# Patient Record
Sex: Male | Born: 1945
Health system: Southern US, Community
[De-identification: ages and names within clinical notes are randomized; demographics above are authoritative.]

## PROBLEM LIST (undated history)

## (undated) DIAGNOSIS — K7460 Unspecified cirrhosis of liver: Secondary | ICD-10-CM

## (undated) DIAGNOSIS — R0600 Dyspnea, unspecified: Secondary | ICD-10-CM

## (undated) DIAGNOSIS — M51379 Other intervertebral disc degeneration, lumbosacral region without mention of lumbar back pain or lower extremity pain: Secondary | ICD-10-CM

## (undated) DIAGNOSIS — B1910 Unspecified viral hepatitis B without hepatic coma: Secondary | ICD-10-CM

## (undated) DIAGNOSIS — K219 Gastro-esophageal reflux disease without esophagitis: Secondary | ICD-10-CM

## (undated) DIAGNOSIS — E785 Hyperlipidemia, unspecified: Secondary | ICD-10-CM

## (undated) DIAGNOSIS — S42023A Displaced fracture of shaft of unspecified clavicle, initial encounter for closed fracture: Secondary | ICD-10-CM

## (undated) DIAGNOSIS — F1910 Other psychoactive substance abuse, uncomplicated: Secondary | ICD-10-CM

## (undated) DIAGNOSIS — J449 Chronic obstructive pulmonary disease, unspecified: Secondary | ICD-10-CM

## (undated) DIAGNOSIS — B1920 Unspecified viral hepatitis C without hepatic coma: Secondary | ICD-10-CM

## (undated) DIAGNOSIS — R972 Elevated prostate specific antigen [PSA]: Secondary | ICD-10-CM

## (undated) DIAGNOSIS — I10 Essential (primary) hypertension: Secondary | ICD-10-CM

## (undated) DIAGNOSIS — M5137 Other intervertebral disc degeneration, lumbosacral region: Secondary | ICD-10-CM

## (undated) DIAGNOSIS — I719 Aortic aneurysm of unspecified site, without rupture: Secondary | ICD-10-CM

## (undated) DIAGNOSIS — N400 Enlarged prostate without lower urinary tract symptoms: Secondary | ICD-10-CM

## (undated) DIAGNOSIS — J439 Emphysema, unspecified: Secondary | ICD-10-CM

## (undated) DIAGNOSIS — Z9889 Other specified postprocedural states: Secondary | ICD-10-CM

## (undated) DIAGNOSIS — C61 Malignant neoplasm of prostate: Secondary | ICD-10-CM

## (undated) DIAGNOSIS — A159 Respiratory tuberculosis unspecified: Secondary | ICD-10-CM

## (undated) DIAGNOSIS — M353 Polymyalgia rheumatica: Secondary | ICD-10-CM

## (undated) DIAGNOSIS — H269 Unspecified cataract: Secondary | ICD-10-CM

## (undated) DIAGNOSIS — M1990 Unspecified osteoarthritis, unspecified site: Secondary | ICD-10-CM

## (undated) DIAGNOSIS — B009 Herpesviral infection, unspecified: Secondary | ICD-10-CM

## (undated) DIAGNOSIS — F1911 Other psychoactive substance abuse, in remission: Secondary | ICD-10-CM

## (undated) HISTORY — DX: Benign prostatic hyperplasia without lower urinary tract symptoms: N40.0

## (undated) HISTORY — DX: Malignant neoplasm of prostate: C61

## (undated) HISTORY — DX: Elevated prostate specific antigen (PSA): R97.20

## (undated) HISTORY — PX: SPINE SURGERY: SHX786

## (undated) HISTORY — DX: Chronic obstructive pulmonary disease, unspecified: J44.9

## (undated) HISTORY — DX: Other specified postprocedural states: Z98.890

## (undated) HISTORY — DX: Other intervertebral disc degeneration, lumbosacral region without mention of lumbar back pain or lower extremity pain: M51.379

## (undated) HISTORY — DX: Other intervertebral disc degeneration, lumbosacral region: M51.37

## (undated) HISTORY — PX: BACK SURGERY: SHX140

## (undated) HISTORY — DX: Hyperlipidemia, unspecified: E78.5

## (undated) HISTORY — PX: EYE SURGERY: SHX253

## (undated) HISTORY — DX: Unspecified osteoarthritis, unspecified site: M19.90

## (undated) HISTORY — PX: OTHER SURGICAL HISTORY: SHX169

## (undated) HISTORY — PX: CERVICAL SPINE SURGERY: SHX589

## (undated) HISTORY — DX: Polymyalgia rheumatica: M35.3

## (undated) HISTORY — DX: Emphysema, unspecified: J43.9

## (undated) HISTORY — DX: Respiratory tuberculosis unspecified: A15.9

## (undated) HISTORY — DX: Herpesviral infection, unspecified: B00.9

## (undated) HISTORY — DX: Unspecified viral hepatitis C without hepatic coma: B19.20

## (undated) HISTORY — PX: HERNIA REPAIR: SHX51

## (undated) HISTORY — DX: Essential (primary) hypertension: I10

## (undated) HISTORY — DX: Other psychoactive substance abuse, uncomplicated: F19.10

## (undated) HISTORY — DX: Other psychoactive substance abuse, in remission: F19.11

## (undated) HISTORY — DX: Aortic aneurysm of unspecified site, without rupture: I71.9

## (undated) HISTORY — DX: Unspecified cataract: H26.9

---

## 1990-07-21 HISTORY — PX: OTHER SURGICAL HISTORY: SHX169

## 1996-10-19 DIAGNOSIS — Z8619 Personal history of other infectious and parasitic diseases: Secondary | ICD-10-CM

## 1996-10-19 DIAGNOSIS — B1920 Unspecified viral hepatitis C without hepatic coma: Secondary | ICD-10-CM

## 1996-10-19 HISTORY — DX: Unspecified viral hepatitis C without hepatic coma: B19.20

## 1996-10-19 HISTORY — DX: Personal history of other infectious and parasitic diseases: Z86.19

## 1997-05-21 DIAGNOSIS — K7460 Unspecified cirrhosis of liver: Secondary | ICD-10-CM | POA: Insufficient documentation

## 1998-07-03 ENCOUNTER — Ambulatory Visit (HOSPITAL_COMMUNITY): Admission: RE | Admit: 1998-07-03 | Discharge: 1998-07-03 | Payer: Self-pay | Admitting: Gastroenterology

## 2000-03-09 ENCOUNTER — Ambulatory Visit (HOSPITAL_COMMUNITY): Admission: RE | Admit: 2000-03-09 | Discharge: 2000-03-09 | Payer: Self-pay | Admitting: Gastroenterology

## 2000-03-09 ENCOUNTER — Encounter (INDEPENDENT_AMBULATORY_CARE_PROVIDER_SITE_OTHER): Payer: Self-pay | Admitting: Specialist

## 2003-02-20 ENCOUNTER — Encounter: Payer: Self-pay | Admitting: *Deleted

## 2003-02-20 ENCOUNTER — Inpatient Hospital Stay (HOSPITAL_COMMUNITY): Admission: AD | Admit: 2003-02-20 | Discharge: 2003-02-24 | Payer: Self-pay | Admitting: Family Medicine

## 2003-03-10 ENCOUNTER — Encounter: Admission: RE | Admit: 2003-03-10 | Discharge: 2003-03-10 | Payer: Self-pay | Admitting: Family Medicine

## 2003-03-10 ENCOUNTER — Encounter: Payer: Self-pay | Admitting: Family Medicine

## 2004-01-17 ENCOUNTER — Encounter: Admission: RE | Admit: 2004-01-17 | Discharge: 2004-01-17 | Payer: Self-pay | Admitting: Family Medicine

## 2004-08-21 DIAGNOSIS — Z9889 Other specified postprocedural states: Secondary | ICD-10-CM

## 2004-08-21 HISTORY — DX: Other specified postprocedural states: Z98.890

## 2004-11-14 ENCOUNTER — Ambulatory Visit (HOSPITAL_COMMUNITY): Admission: RE | Admit: 2004-11-14 | Discharge: 2004-11-14 | Payer: Self-pay | Admitting: Family Medicine

## 2004-11-27 ENCOUNTER — Encounter: Admission: RE | Admit: 2004-11-27 | Discharge: 2005-02-25 | Payer: Self-pay | Admitting: Family Medicine

## 2005-08-13 ENCOUNTER — Ambulatory Visit: Payer: Self-pay

## 2006-05-15 ENCOUNTER — Inpatient Hospital Stay (HOSPITAL_COMMUNITY): Admission: RE | Admit: 2006-05-15 | Discharge: 2006-05-16 | Payer: Self-pay | Admitting: Neurosurgery

## 2009-01-12 ENCOUNTER — Encounter: Admission: RE | Admit: 2009-01-12 | Discharge: 2009-01-12 | Payer: Self-pay | Admitting: Emergency Medicine

## 2009-07-21 HISTORY — PX: OTHER SURGICAL HISTORY: SHX169

## 2009-10-31 ENCOUNTER — Encounter: Admission: RE | Admit: 2009-10-31 | Discharge: 2009-10-31 | Payer: Self-pay | Admitting: Gastroenterology

## 2009-11-21 ENCOUNTER — Ambulatory Visit (HOSPITAL_COMMUNITY): Admission: RE | Admit: 2009-11-21 | Discharge: 2009-11-21 | Payer: Self-pay | Admitting: Gastroenterology

## 2010-04-25 ENCOUNTER — Encounter: Admission: RE | Admit: 2010-04-25 | Discharge: 2010-04-25 | Payer: Self-pay | Admitting: Orthopedic Surgery

## 2010-07-04 ENCOUNTER — Encounter
Admission: RE | Admit: 2010-07-04 | Discharge: 2010-07-04 | Payer: Self-pay | Source: Home / Self Care | Attending: Emergency Medicine | Admitting: Emergency Medicine

## 2010-08-11 ENCOUNTER — Encounter: Payer: Self-pay | Admitting: Gastroenterology

## 2010-10-08 LAB — CBC
MCHC: 34.3 g/dL (ref 30.0–36.0)
MCV: 93.7 fL (ref 78.0–100.0)
Platelets: 222 10*3/uL (ref 150–400)
RBC: 5.07 MIL/uL (ref 4.22–5.81)
RDW: 13.2 % (ref 11.5–15.5)

## 2010-10-08 LAB — DIFFERENTIAL
Basophils Relative: 1 % (ref 0–1)
Eosinophils Absolute: 0.1 10*3/uL (ref 0.0–0.7)
Monocytes Relative: 12 % (ref 3–12)
Neutrophils Relative %: 51 % (ref 43–77)

## 2010-10-14 ENCOUNTER — Other Ambulatory Visit: Payer: Self-pay | Admitting: Orthopaedic Surgery

## 2010-10-14 DIAGNOSIS — M7918 Myalgia, other site: Secondary | ICD-10-CM

## 2010-10-14 DIAGNOSIS — M545 Low back pain: Secondary | ICD-10-CM

## 2010-10-15 ENCOUNTER — Other Ambulatory Visit: Payer: Self-pay | Admitting: Gastroenterology

## 2010-10-15 DIAGNOSIS — B182 Chronic viral hepatitis C: Secondary | ICD-10-CM

## 2010-10-16 ENCOUNTER — Ambulatory Visit
Admission: RE | Admit: 2010-10-16 | Discharge: 2010-10-16 | Disposition: A | Discharge status: Home / Self Care | Payer: PRIVATE HEALTH INSURANCE | Source: Ambulatory Visit | Attending: Orthopaedic Surgery | Admitting: Orthopaedic Surgery

## 2010-10-16 ENCOUNTER — Ambulatory Visit
Admission: RE | Admit: 2010-10-16 | Discharge: 2010-10-16 | Disposition: A | Discharge status: Home / Self Care | Payer: PRIVATE HEALTH INSURANCE | Source: Ambulatory Visit | Attending: Gastroenterology | Admitting: Gastroenterology

## 2010-10-16 DIAGNOSIS — M545 Low back pain: Secondary | ICD-10-CM

## 2010-10-16 DIAGNOSIS — B182 Chronic viral hepatitis C: Secondary | ICD-10-CM

## 2010-10-16 DIAGNOSIS — M7918 Myalgia, other site: Secondary | ICD-10-CM

## 2010-12-06 NOTE — Discharge Summary (Signed)
George Watson, George Watson                ACCOUNT NO.:  [id number]   MEDICAL RECORD NO.:  [id number]          PATIENT TYPE:  INP   LOCATION:  3035                         FACILITY:  [facility]   PHYSICIAN:  Payton Doughty, M.D.      DATE OF BIRTH:  Jan 15, 1946   DATE OF ADMISSION:  05/13/2006  DATE OF DISCHARGE:  05/16/2006                                 DISCHARGE SUMMARY   ADMITTING DIAGNOSIS:  Herniated disk, C6-C7.   DISCHARGE DIAGNOSIS:  Herniated disk, C6-C7.   PROCEDURE PERFORMED:  C6-C7 anterior cervical decompression and fusion.   COMPLICATIONS:  Recurrent neck hematoma, requiring second exploration.   SERVICE:  Neurosurgery.   This is a 65 year old right-handed white gentleman.  His History and  Physical is recounted in the chart.  He was admitted by Dr. Lovell Sheehan on  October 25 and underwent anterior decompression and fusion.  Postoperatively, he did well initially and then during the night had a large  clot develop in his neck.  He had to be taken back urgently to the operating  room.  He had a large hematoma.  A Jackson-Pratt drain was placed.  Through  yesterday and this morning, it drained small amounts.  The drain has been  removed.  Currently, his airway is without difficulty.  His strength is  full.  His incision is dry and well-healing.  He is being discharged with  Percocet for pain.  His followup will be in the [facility] office.           ______________________________  Payton Doughty, M.D.     MWR/MEDQ  D:  05/16/2006  T:  05/17/2006  Job:  [id number]

## 2010-12-06 NOTE — Op Note (Signed)
George Watson, George Watson                ACCOUNT NO.:  [id number]   MEDICAL RECORD NO.:  [id number]          PATIENT TYPE:  OIB   LOCATION:  3108                         FACILITY:  [facility]   PHYSICIAN:  Cristi Loron, M.D.DATE OF BIRTH:  [birth year]   DATE OF PROCEDURE:  05/13/2006  DATE OF DISCHARGE:                                 OPERATIVE REPORT   PREOPERATIVE DIAGNOSES:  C6-7 herniated nucleus pulposus, spondylosis,  stenosis, cervical radiculopathy, cervicalgia.   POSTOPERATIVE DIAGNOSES:  C6-7 herniated nucleus pulposus, spondylosis,  stenosis, cervical radiculopathy, cervicalgia.   PROCEDURES:  C6-7 anterior cervical diskectomy and interbody arthrodesis,  placement of C6-7 interbody prosthesis, C6-7 anterior cervical plating  (Codman Slimlock titanium plate and screws).   SURGEON:  Cristi Loron, M.D.   ASSISTANT:  Hewitt Shorts, M.D.   ANESTHESIA:  General endotracheal.   ESTIMATED BLOOD LOSS:  50 cc.   SPECIMENS:  None.   DRAINS:  None.   COMPLICATIONS:  None.   BRIEF HISTORY:  This patient is a 65 year old white male, who has suffered  from neck and left arm pain, consistent with a C7 radiculopathy.  He failed  medical management and was worked up with a cervical MRI, which demonstrated  a herniated disk at C6-7 with stenosis.  I discussed the various treatment  options with the patient, including surgery.  He has weighed the risks,  benefits and alternatives of surgery and decided to proceed with a C6-7  anterior cervical diskectomy and fusion.   DESCRIPTION OF PROCEDURES:  The patient was brought to the operating room by  the anesthesia team.  General endotracheal anesthesia was induced.  The  patient remained in supine position and a roll was placed under his  shoulders, placing his neck in slight extension.  His anterior cervical  region was then prepared with Betadine Scrub and Betadine Solution and  sterile drapes were applied.  I then injected  the area to be incised with  Marcaine with epinephrine solution and used a scalpel to make a transverse  incision in the patient's left anterior neck.  I used the Metzenbaum  scissors to divide the platysma muscle, and then the dissect medial to the  sternocleidomastoid, jugular vein and carotid artery.  I carefully dissected  down towards the anterior cervical spine, identifying the esophagus and  retracting it medially.  I used a Kitner and swabs and cleared soft tissue  from the anterior cervical spine, and then I inserted a bent spinal needle  through the exposed intervertebral disk space.  We obtained an  intraoperative radiograph to confirm our location.   I then used electrocautery to detach the medial border of the longus colli  muscle bilaterally to expose the C6-7 intervertebral disk space.  I then  inserted the Caspar self-retaining retractor underneath the longus colli  muscle bilaterally to provide exposure.  I incised the C6-7 intervertebral  disk with a 15-blade scalpel.  I performed a partial diskectomy using a  pituitary forceps and the Karlin curets.  I started distraction screws at C6-  7, distracted the interspace, and then used a  high-speed drill to  decorticate the C6-7 vertebral endplates.  I drilled away the remainder of  the C6-7 intervertebral disk.  I drilled away some posterior spondylosis and  thinned down the posterior longitudinal ligament.  I then incised the  ligament with an arachnoid knife and removed it with a Kerrison punch,  undercutting the vertebral endplates and decompressing the thecal sac.  I  then performed a foraminotomy about the C7 nerve roots.  Of note, there were  a soft disk herniation, as well as spondylosis on the left, and mainly  spondylosis on the right.  I decompressed the nerve roots well bilaterally.   Having completed the decompression, we now turned our attention to  arthrodesis.  I obtained a small Alphatec PEEK interbody  spacer.  I filled  it with a combination of local morcellized autograft bone.  We obtained our  decompression, as well as Alphatec putty.  We then placed the spacer into  the distracted C6-7 interspace, removed the distraction screws, and there  was a good snug fit of the prosthesis in the interspace.   We now turned attention to anterior spinal instrument.  I used a high-speed  drill to remove some ventral spondylosis from the vertebral endplate at C6-7  so that the plate would lie down flat.  We selected the appropriate length  Codman Slimlock anterior cervical plate, and we laid it along the anterior  aspect of the vertebral bodies of C6 and C7.  We drilled two 14-mm holes at  C6, two at C7, and then secured the plate to the vertebral bodies by placing  two 14-mm self-tapping screws at C6 and two at C7.  We obtained  intraoperative radiograph.  There was limited visualization because of the  patient's shoulders.  However, the instrumentation looked good in vivo.  We  therefore secured the screws to the plate by locking each cam.   We then obtained hemostasis using bipolar electrocautery.  We irrigated the  wound out with Bacitracin solution, removed the Caspar self-retaining  retractor and inspected the esophagus for any damage.  There was none  apparent.  We then reapproximated the patient's platysma muscle with  interrupted 3-0 Vicryl suture, the subcutaneous tissue with interrupted 3-0  Vicryl suture, and the skin with Steri-Strips and benzoin.  The wound was  then coated with Bacitracin ointment.  Sterile dressings were applied.  The  drapes were removed.  The patient was subsequently extubated by the  anesthesia team and transported to the postanesthesia care unit in stable  condition.  All sponge, instrument and needle counts were correct at the end  of the case.      Cristi Loron, M.D.  Electronically Signed    JDJ/MEDQ  D:  05/14/2006  T:  05/14/2006  Job:   [id number]

## 2010-12-06 NOTE — H&P (Signed)
George Watson, George Watson                          ACCOUNT NO.:  [id number]   MEDICAL RECORD NO.:  [id number]                   PATIENT TYPE:  INP   LOCATION:  6702                                 FACILITY:  [facility]   PHYSICIAN:  Hettie Holstein, D.O.                 DATE OF BIRTH:  [birth year]   DATE OF ADMISSION:  02/20/2003  DATE OF DISCHARGE:                                HISTORY & PHYSICAL   PRIMARY CARE PHYSICIAN:  [name], M.D.   CHIEF COMPLAINT:  Persistent cough, fevers, and weight loss 10 pounds.   HISTORY OF PRESENT ILLNESS:  This is a 65 year old male with history of drug  abuse in the distant past who has been a recovering addict for 17 years. He  was treated for tuberculosis approximately 13 years ago with INH for one  year course. He had been in his usual state of health until July 26 when he  felt subjective fevers, nonproductive cough refractory to symptomatic  treatment failed to improve with one week of antibiotics including Z-Pack,  Tequin, as well as intramuscular antibiotics which was given by his PCP and  a short course of prednisone. Chest x-ray was suggestive of pneumonia. Upon  my review, I was concerned about possible cavitary lesions, so I placed the  patient in isolation and ordered a CT scan as well as orders to have AFBs  smears obtained.   PAST MEDICAL HISTORY:  1. Hypertension.  2. Tuberculosis diagnosed during a drug treatment program 17 years ago. He     was treated for INH for a year. This took place approximately 13 years     ago.  3. Hepatitis C. He has been treated five times with Interferon and     ribavirin. His last treatment was 13 months ago. His GI physician is Dr.     [name]. He states that his HIV screen was negative 10 years ago and has     been monogamous since that time.   MEDICATIONS:  1. Lisinopril 10.  2. Hydrochlorothiazide 12.5 daily.  3. Zetia 10 mg daily.  4. Bextra 20 mg daily.  5. Bupropion SR 150 mg b.i.d.   ALLERGIES:  No known drug allergies.   SOCIAL HISTORY:  One pack per day for 43 years. No alcohol since 17 years.  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 He  is married.   FAMILY HISTORY:  Significant for mother who died at age of 82 with leukemia.  Father died at age of 46 of esophageal cancer. He has four brothers and  three sisters, all of whom are healthy, and no children.   REVIEW OF SYSTEMS:  Significant for generalized weight loss. Positive  fevers, cough, chills, and night sweats. Denies vision or auditory problems.  Denies pharyngitis. RESPIRATORY:  Persistent cough unrelieved with over-the-  counter medications as well as prescribed antitussive  medications. He denies  hemoptysis. Denies productive cough. Denies orthopnea or dyspnea on  exertion. He does have some shortness of breath, mostly with his cough.  CARDIOVASCULAR:  He denies chest pain. Denies diaphoresis. Denies anginal  complaints. Denies palpitations. GASTROINTESTINAL:  Denies nausea, vomiting,  diarrhea. Denies hematemesis. Denies melena. Denies hematochezia. Denies  abdominal discomfort, bowel irregularity, or change in stool caliber.  GENITOURINARY:  Denies dysuria. Denies burning on urination, increased  frequency, decreased frequency. ENDOCRINE:  Denies polyphagia, polydipsia,  polyuria. Denies weight gain; however reports 10-pound weight loss over the  course of a week attributed to the history of presenting illness.  MUSCULOSKELETAL:  The patient denies joint pain, stiffness, morning  stiffness, back pain. NEUROLOGICAL:  The patient does report that he is  anxious. He has difficulty in closed spaces and being in the hospital.   PHYSICAL EXAMINATION:  VITAL SIGNS:  Blood pressure 140/80, heart rate 70,  temperature 97.1.  GENERAL:  Awake, alert, in no acute distress.  HEENT:  Normocephalic, atraumatic. Extraocular movements intact. Pupils  equally reactive to light. Nasal  mucosa pink and moist. Throat clear without  injection. Oropharynx was not erythematous.  NECK:  Supple and nontender. No palpable thyromegaly or tracheal deviation.  CARDIOVASCULAR:  S1 and S2 was regular without S3 or S4, murmurs, rubs, or  gallops. PMI was nondisplaced.  PULMONARY:  Lungs were coarse to auscultation bilaterally. No dullness to  percussion. There was a scattered wheeze.  ABDOMEN:  Soft and nontender. No palpable mass. No palpable tenderness. No  rigidity. No costovertebral angle tenderness.  EXTREMITIES:  Revealed no edema, cyanosis, clubbing. Peripheral pulses were  symmetrical bilaterally.  NEUROLOGICAL:  The patient was alert and oriented. There were no focal  neurological deficits. Strength was symmetrical, +5/5 with proximal and  distal upper and lower extremities. Deep tendon reflexes were +2/4, with  respect to the patella as well as the biceps.   LABORATORY DATA:  Chest x-ray from July 26 was reported as left lobe  pneumonia. It appeared suspicious for a cavitary lesion in the left lower  lobe. This prompted further evaluation with a CT scan. Labs were ordered  including CBC, comprehensive metabolic panel, TSH, PT/INR. None of these  were available as this patient was directly admitted to the floor from the  PCP's office.   IMPRESSION:  1. Refractory cough, febrile illness, weight loss and anorexia. There is     highly noted suspicion for lung cancer with possible cavitary lesion on     this chest x-ray from an outlying hospital; however, cannot rule out TB.     Will isolate him at this time, check AFB smears, and followup up CT scan.     If the CT scan is negative for any evidence of pulmonary TB, we can     discontinue isolation, and if it is positive and suggestive, he will be     treated. Perhaps will ask infectious disease to see.  2. Hepatitis C, refractory to treatment. He had undergone Interferon and    ribavirin treatment x5. We will follow his  liver functions during his     course here.  3. Hypertension. We will continue his current regimen. It is apparently     controlled at this time.  4. History of increased lipids. It appears that he has not been taking     statins. He has been taking Zetia, possibly because of his underlying     liver disease.  5. We will initiate  DVT prophylaxis while he is in bed. Currently, we are     awaiting the results of the studies ordered.                                                Hettie Holstein, D.O.    ESS/MEDQ  D:  02/21/2003  T:  02/21/2003  Job:  [id number]

## 2010-12-06 NOTE — Op Note (Signed)
NAMEAVERI, CACIOPPO                ACCOUNT NO.:  [id number]   MEDICAL RECORD NO.:  [id number]          PATIENT TYPE:  OIB   LOCATION:  3108                         FACILITY:  [facility]   PHYSICIAN:  Cristi Loron, M.D.DATE OF BIRTH:  20-Jan-1946   DATE OF PROCEDURE:  05/14/2006  DATE OF DISCHARGE:                                 OPERATIVE REPORT   PREOPERATIVE DIAGNOSIS:  Cervical hematoma.   POSTOPERATIVE DIAGNOSIS:  Cervical hematoma.   PROCEDURE:  Evacuation of cervical hematoma, placement of Jackson-Pratt  drain.   SURGEON:  Cristi Loron, M.D.   ASSISTANT:  Hewitt Shorts, M.D.   ANESTHESIA:  General endotracheal.   ESTIMATED BLOOD LOSS:  100 cc.   SPECIMENS:  None.   DRAINS:  One 10-mm flat Jackson-Pratt drain in the prevertebral space.   COMPLICATIONS:  None.   BRIEF HISTORY:  The patient is a 65 year old white male, for whom I  performed a C6-7 anterior cervical diskectomy and fusion with plating on  05/14/06.  The surgery was uneventful, as was his initial postoperative  course.  However, on the evening of surgery, the patient developed  difficulty swallowing and had some swelling in his neck.  He was evaluated  by Dr. Newell Coral, and Dr. Newell Coral recommended an evacuation of a presumed  cervical hematoma.  He described the surgery and the risks to the patient,  and he subsequently called me.  I have answered all of the patient's  questions.  He wants to proceed with evacuation of his cervical hematoma.   DESCRIPTION OF PROCEDURE:  The patient was brought to the operating room by  the anesthesia team.  The patient was intubated via fiberoptic intubation.  His anterior cervical region was then prepared with Betadine scrub and  Betadine Solution.  Sterile drapes were applied.  I then used a scalpel to  cut through the patient's fresh surgical scar.  I used a Metzenbaum scissors  to divide the sutures in the platysma.  On doing this, there was obvious  large hematoma.  We removed this using a combination of suction and  irrigation.  We then carefully dissected down towards the prevertebral space  and encountered more large clotted hematoma.  I identified the esophagus and  Dr. Newell Coral carefully retracted it medially with a handheld retractor.  We  then removed all the hematoma we could see.  We did encounter a small  arterial bleeder along the left longus colli.  This, perhaps, was the cause  of the bleeding.  We coagulated using bipolar cautery.  We did not see any  other active bleeders.  I carefully inspected the esophagus.  There was no  evidence of injury.  I inspected the plate and screws and the interbody  prosthesis.  They all looked to be in good position.  I did not see any  evidence of epidural hematoma, with limited visualization.  I then packed  the wound off with dry sponges, and there appeared to be good hemostasis.  I  then placed a 10-mm flat Jackson-Pratt drain in the prevertebral space and  tunneled it out  through a separate stab wound.  We then reapproximated the  patient's platysma muscle with interrupted 3-0 Vicryl suture, subcutaneous  tissue with interrupted 3-0 Vicryl suture, and the skin with Steri-Strips  and benzoin.  We then sutured the drain in place using a 3-0 nylon suture.  We then reapproximated the patient's the skin with Steri-Strips and benzoin,  and coated the wounds with Bacitracin ointment, applied a sterile dressing,  removed the drapes, and the patient was subsequently transported to the ICU  intubated, in stable condition.  (We decided it would be safer to keep the  patient intubated overnight, rather than extubate him right after surgery.)  All sponge, instrument and needle counts were correct at the end of this  case.      Cristi Loron, M.D.  Electronically Signed     JDJ/MEDQ  D:  05/14/2006  T:  05/15/2006  Job:  [id number]   cc:   Hewitt Shorts, M.D.

## 2010-12-06 NOTE — Discharge Summary (Signed)
George Watson, George Watson                          ACCOUNT NO.:  [id number]   MEDICAL RECORD NO.:  [id number]                   PATIENT TYPE:  INP   LOCATION:  6737                                 FACILITY:  [facility]   PHYSICIAN:  Hettie Holstein, D.O.                 DATE OF BIRTH:  [birth year]   DATE OF ADMISSION:  02/20/2003  DATE OF DISCHARGE:                                 DISCHARGE SUMMARY   PRIMARY CARE PHYSICIAN:  [name], M.D.   ADMISSION DIAGNOSES:  1. Community acquired pneumonia refractory to outpatient management.  2. Febrile illness.  3. Weight loss.   DISCHARGE DIAGNOSES:  1. Community acquired pneumonia in the right lung.  2. Persistent cough.  3. Weight loss and loss of appetite, resolving with treatment of the above.     However close follow up is recommended.  4. Abnormal CT scan with regards to the ascending aorta.  [name] input is pending at this time and will pursue this prior to     discharge.  5. Chronic hepatitis C with elevated liver function tests, patient will     follow up with his gastroenterologist, Dr. [name].  6. Tobacco dependence.  The patient is currently requesting nicotine patches     for tobacco cessation.  7. Hypertension.  The patient is on an ACE inhibitor which he has been on     for six months.  States he has never had problems with a cough related to     this medication, as well as hydrochlorothiazide.  It may be worthwhile to reassess if he continues to have this persistent  cough after a week that he try a different antihypertensive medication.  If  this cough persists after this he should definitely call [name] and have pulmonary function testing done as an outpatient.  1. History of substance abuse.  At present he states that he has refrained     from substance abuse for over 17 years.   DISCHARGE MEDICATIONS:  1. Albuterol meter dose inhaler 2 puffs q.i.d. p.r.n.  2. Atrovent meter dose  inhaler 2 puffs q.i.d. p.r.n.  3. Wellbutrin SR 150 mg p.o. b.i.d.  4. Tussionex suspension 5 mL q.8h p.r.n.  5. Protonix 40 mg p.o. daily for 20 days.  6. Zithromax 250 mg p.o. daily until March 03, 2003.  7. Zetia 10 mg p.o. daily.  8. Hydrochlorothiazide 12.5 mg p.o. daily.  9. Prinivil 10 mg p.o. daily (patient ready to discontinue if cough     persists).  10.      Nicotine patch 21 mg daily, dispense a two-week supply.   DISPOSITION:  The patient was instructed to have a CT of his chest in three  months to evaluate the borderline abnormalities noted on this admission.  It  is also recommended that he follow up with Dr. Rise Mu  Mazzocchi within the  next couple of weeks.  He should call and schedule an appointment.  He is  requested to remain off work, I will provide him with a work slip for one  week.   HISTORY OF PRESENT ILLNESS:  This is a 65 year old male with history of drug  abuse in the distant past who has been in recovery for the past 17 years.  He has been treated for tuberculosis approximately 15 years ago with INH for  a one-year course.  He has been in his usual state of health until February 13, 2003 when he felt subjective fevers, nonproductive cough, refractory to  symptomatic treatment.  He failed to improve with one week of antibiotics of  Z-Pak, Tequin as well as IM antibiotic injections provided by his primary  care physician (and a short course of benzo).  His chest x-ray was  suggestive of pneumonia.  Upon my review I was more concerned about  appearance of a cavitary lesion so I placed the patient in isolation and  ordered a CT scan, and had some smears obtained.  I started him on coverage  for community acquired pneumonia.   HOSPITAL COURSE:  He was admitted to an isolation floor and started on IV  antibiotics for community acquired pneumonia.  He underwent CT scanning of  the chest that revealed no evidence of pulmonary tuberculosis and isolation   precautions were discontinued and he was continued on his treatment for  community acquired pneumonia.  He had AFB smears as well during his course  and were negative for acid fast bacilli, however.  Completion of his smears  will not be reported until after his discharge.  He continued to have a  persistent cough while in the hospital and had poor energy and appetite.  Lungs improved on antibiotic therapy.  His O2 saturation was borderline on  room air at 90% on hospital day number three, and which did improve towards  the latter part of this hospitalization.  He required antitussive therapy  for his persistent cough.  He also tried albuterol and Atrovent inhalers to  which he responded to a certain degree.  It was recommended that he continue  his antibiotic course until its completion and after this he could attempt a  trial off of his ACE inhibitor which may be the cause of his cough.  He is  instructed to do this under the guidance of his primary care physician and  as well, he is encouraged to stop smoking and a nicotine patch was ordered  for him upon discharge as well.  Otherwise his hospital course was  uneventful.  He slowly reached his baseline, able to ambulate and eat with a  good appetite.   LABORATORY STUDIES THIS HOSPITALIZATION:  An HIV test was nonreactive.  Sputum Gram stain was negative for AFB, however the final report is to  follow.  Blood cultures were negative.  TSH was 2.8.  INR was 3.9.  Magnesium was 0.1, sodium 137, potassium 2.3 (prior to supplementation), CO2  27, BUN 30, creatinine 0.8, AST 57, ALT 77, bilirubin 1.2, alkaline  phosphatase 66, total protein 7.1, albumin 3.6, calcium 9.1.  WBC 10,100,  hemoglobin 17.9, hematocrit 52, platelets 217,000.   CT scan of the chest: 1) Patchy parenchymal densities in the right lung as  described consistent with patchy air space disease.  2) Apical ground glass opacities which are nonspecific.  3) Borderline  mediastinal lymphadenopathy  with a follow up  recommendation in three to six months.  4) Ascending aorta  3.9 cm in caliber, this was discussed with the radiologist and cardiology  was asked to evaluate this patient.   FOLLOW UP RECOMMENDATIONS:  Cardiology is to follow.                                                Hettie Holstein, D.O.    ESS/MEDQ  D:  02/24/2003  T:  02/25/2003  Job:  [id number]   cc:   [name], M.D.  [address]  [place]  [place] [address]  Fax: [phone]

## 2010-12-06 NOTE — Procedures (Signed)
[facility]  Patient:    George Watson, George Watson                         MRN: [id number] Proc. Date: 03/09/00 Attending:  Griffith Citron, M.D.                           Procedure Report  PROCEDURE:  Percutaneous liver biopsy.  INDICATIONS FOR PROCEDURE:  A 65 year old white male undergoing percutaneous liver biopsy at 3 year interval to reassess degree of inflammation, stage of fibrosis to guide future antiviral therapy.  DESCRIPTION OF PROCEDURE:  After reviewing the nature of the procedure with the patient including the potential risks and complications of liver biopsy including hemorrhage and perforation, informed consent was signed.  The patient was premedicated receiving Versed 3 mg, fentanyl 25 mcg IV prior to the onset of the procedure.  A physical examination was performed. Site selected in the ninth intercostal space, right mid axillary line with dullness to percussion and full exploration. No auscultatory rubber bruit heard. Skin prepped with Betadine followed by 1 percent xylocaine infiltration. Following trocar puncture, using Kl[place]kin liver biopsy technique, 2 passes were made to obtain a single core of grossly normal hepatic tissue approximately 3 cm in length. The first pass did not yield any tissue. The wound was dressed, patient placed in the right lateral decubitus position. The patient tolerated the procedure without difficulty being maintained datascope monitor and low flow oxygen throughout. Returned to recovery in stable condition. Specimen submitted for routine surgical pathology and formalin.  ASSESSMENT:  1. Chronic viral hepatitis C.  2. Successful percutaneous liver biopsy--no immediate complications.  RECOMMENDATIONS:  1. Post procedure orders written.  2. Patient instructions reviewed.  3. Return office visit 2 week to follow-up pathology. DD:  03/09/00 TD:  03/09/00 Job: [id number]

## 2011-03-24 LAB — LIPID PANEL: Cholesterol: 171 mg/dL (ref 0–200)

## 2011-05-29 ENCOUNTER — Other Ambulatory Visit: Payer: Self-pay | Admitting: Emergency Medicine

## 2011-06-05 ENCOUNTER — Other Ambulatory Visit: Payer: PRIVATE HEALTH INSURANCE

## 2011-07-18 ENCOUNTER — Ambulatory Visit (INDEPENDENT_AMBULATORY_CARE_PROVIDER_SITE_OTHER): Payer: PRIVATE HEALTH INSURANCE

## 2011-07-18 DIAGNOSIS — R5381 Other malaise: Secondary | ICD-10-CM

## 2011-08-05 ENCOUNTER — Encounter (INDEPENDENT_AMBULATORY_CARE_PROVIDER_SITE_OTHER): Payer: PRIVATE HEALTH INSURANCE | Admitting: Emergency Medicine

## 2011-08-05 DIAGNOSIS — Z0000 Encounter for general adult medical examination without abnormal findings: Secondary | ICD-10-CM

## 2011-08-06 LAB — HEPATIC FUNCTION PANEL
ALT: 69 U/L — AB (ref 10–40)
AST: 42 U/L — AB (ref 14–40)

## 2011-08-06 LAB — POCT ERYTHROCYTE SEDIMENTATION RATE, NON-AUTOMATED: Sed Rate: 30 mm

## 2011-09-03 ENCOUNTER — Other Ambulatory Visit: Payer: Self-pay | Admitting: Emergency Medicine

## 2011-09-12 ENCOUNTER — Encounter: Payer: Self-pay | Admitting: *Deleted

## 2011-09-12 DIAGNOSIS — R972 Elevated prostate specific antigen [PSA]: Secondary | ICD-10-CM

## 2011-09-12 DIAGNOSIS — B009 Herpesviral infection, unspecified: Secondary | ICD-10-CM

## 2011-09-12 DIAGNOSIS — M51379 Other intervertebral disc degeneration, lumbosacral region without mention of lumbar back pain or lower extremity pain: Secondary | ICD-10-CM

## 2011-09-12 DIAGNOSIS — J449 Chronic obstructive pulmonary disease, unspecified: Secondary | ICD-10-CM | POA: Insufficient documentation

## 2011-09-12 DIAGNOSIS — I10 Essential (primary) hypertension: Secondary | ICD-10-CM

## 2011-09-12 DIAGNOSIS — F1911 Other psychoactive substance abuse, in remission: Secondary | ICD-10-CM

## 2011-09-12 DIAGNOSIS — C61 Malignant neoplasm of prostate: Secondary | ICD-10-CM

## 2011-09-12 DIAGNOSIS — N400 Enlarged prostate without lower urinary tract symptoms: Secondary | ICD-10-CM | POA: Insufficient documentation

## 2011-09-12 DIAGNOSIS — M5137 Other intervertebral disc degeneration, lumbosacral region: Secondary | ICD-10-CM | POA: Insufficient documentation

## 2011-09-12 DIAGNOSIS — M47816 Spondylosis without myelopathy or radiculopathy, lumbar region: Secondary | ICD-10-CM | POA: Insufficient documentation

## 2011-09-12 DIAGNOSIS — B1920 Unspecified viral hepatitis C without hepatic coma: Secondary | ICD-10-CM

## 2011-09-12 DIAGNOSIS — E785 Hyperlipidemia, unspecified: Secondary | ICD-10-CM

## 2011-09-16 ENCOUNTER — Ambulatory Visit (INDEPENDENT_AMBULATORY_CARE_PROVIDER_SITE_OTHER): Payer: PRIVATE HEALTH INSURANCE | Admitting: Physician Assistant

## 2011-09-16 VITALS — BP 115/69 | HR 71 | Temp 97.9°F | Resp 16 | Ht 71.0 in | Wt 170.0 lb

## 2011-09-16 DIAGNOSIS — H5710 Ocular pain, unspecified eye: Secondary | ICD-10-CM

## 2011-09-16 DIAGNOSIS — H5712 Ocular pain, left eye: Secondary | ICD-10-CM

## 2011-09-16 DIAGNOSIS — H04209 Unspecified epiphora, unspecified lacrimal gland: Secondary | ICD-10-CM

## 2011-09-16 DIAGNOSIS — H04203 Unspecified epiphora, bilateral lacrimal glands: Secondary | ICD-10-CM

## 2011-09-16 NOTE — Progress Notes (Signed)
  Subjective:    Patient ID: George Watson, male    DOB: [birth year], 66 y.o.   MRN: [id number]  HPI Pt presents to clinic with L eye irritation and tearing.  Wears soft contacts and put a new pair in yesterday and wore without a problem.  He woke up this am with L eye pain (always sleeps in contacts - they are good for 2 wks extended wear) and excessive tearing. He immediately took the contact out and the tearing improved but the patient is still experiencing some eye irritation.  He is worried about conjunctivitis and is leaving the country for vacation in  2days and wants to make sure ok.  Vision is distorted because only far vision contact in R eye and trying to wear glasses at the same time.   Review of Systems  Eyes: Positive for pain (irritation), discharge (tears only) and visual disturbance (2nd to no contact in eye). Negative for photophobia and redness.       Objective:   Physical Exam  Constitutional: He appears well-developed and well-nourished.  HENT:  Head: Normocephalic and atraumatic.  Right Ear: External ear normal.  Left Ear: External ear normal.  Eyes: Conjunctivae, EOM and lids are normal. Pupils are equal, round, and reactive to light. Right eye exhibits no discharge. No foreign body present in the right eye. Left eye exhibits no discharge. No foreign body present in the left eye. No scleral icterus.   Eye lid inverted - no FB seen.  Ophthaine to eye - irritation resolved - no fluro uptake of cornea.     Assessment & Plan:   1. Left eye pain   2. Tearing eyes    Glasses for the rest of the day due to fluro contact staining.  Pt to use artifical tears for irritation.  ? Something under contact after sleeping and pt to start with a new contact for L eye tomorrow.  He agrees and understands.  Due to patient leaving in the country if his eye should turn red, become photophobic or purulent d/c from eye ok to call in ophtho abx gtts.

## 2011-09-19 ENCOUNTER — Encounter: Payer: Self-pay | Admitting: Emergency Medicine

## 2011-11-06 ENCOUNTER — Other Ambulatory Visit: Payer: Self-pay | Admitting: Gastroenterology

## 2011-11-06 DIAGNOSIS — C220 Liver cell carcinoma: Secondary | ICD-10-CM

## 2011-11-11 ENCOUNTER — Ambulatory Visit (INDEPENDENT_AMBULATORY_CARE_PROVIDER_SITE_OTHER): Payer: PRIVATE HEALTH INSURANCE | Admitting: Emergency Medicine

## 2011-11-11 ENCOUNTER — Ambulatory Visit
Admission: RE | Admit: 2011-11-11 | Discharge: 2011-11-11 | Disposition: A | Discharge status: Home / Self Care | Payer: PRIVATE HEALTH INSURANCE | Source: Ambulatory Visit | Attending: Gastroenterology | Admitting: Gastroenterology

## 2011-11-11 VITALS — BP 130/63 | HR 64 | Temp 97.0°F | Resp 16 | Ht 71.0 in | Wt 169.2 lb

## 2011-11-11 DIAGNOSIS — M549 Dorsalgia, unspecified: Secondary | ICD-10-CM

## 2011-11-11 DIAGNOSIS — C220 Liver cell carcinoma: Secondary | ICD-10-CM

## 2011-11-11 DIAGNOSIS — J45909 Unspecified asthma, uncomplicated: Secondary | ICD-10-CM

## 2011-11-11 DIAGNOSIS — B182 Chronic viral hepatitis C: Secondary | ICD-10-CM

## 2011-11-11 DIAGNOSIS — E785 Hyperlipidemia, unspecified: Secondary | ICD-10-CM

## 2011-11-11 DIAGNOSIS — E782 Mixed hyperlipidemia: Secondary | ICD-10-CM

## 2011-11-11 LAB — COMPREHENSIVE METABOLIC PANEL
ALT: 57 U/L — ABNORMAL HIGH (ref 0–53)
AST: 49 U/L — ABNORMAL HIGH (ref 0–37)
Creat: 0.83 mg/dL (ref 0.50–1.35)
Sodium: 139 mEq/L (ref 135–145)
Total Bilirubin: 0.7 mg/dL (ref 0.3–1.2)
Total Protein: 7.1 g/dL (ref 6.0–8.3)

## 2011-11-11 LAB — CBC WITH DIFFERENTIAL/PLATELET
Basophils Relative: 1 % (ref 0–1)
Eosinophils Relative: 5 % (ref 0–5)
HCT: 41.6 % (ref 39.0–52.0)
Hemoglobin: 14.3 g/dL (ref 13.0–17.0)
Lymphocytes Relative: 45 % (ref 12–46)
MCHC: 34.4 g/dL (ref 30.0–36.0)
MCV: 93.7 fL (ref 78.0–100.0)
Monocytes Absolute: 0.8 10*3/uL (ref 0.1–1.0)
Monocytes Relative: 15 % — ABNORMAL HIGH (ref 3–12)
Neutro Abs: 1.9 10*3/uL (ref 1.7–7.7)
RDW: 12.5 % (ref 11.5–15.5)

## 2011-11-11 LAB — LIPID PANEL
HDL: 32 mg/dL — ABNORMAL LOW (ref >39)
LDL Cholesterol: 108 mg/dL — ABNORMAL HIGH (ref 0–99)
Triglycerides: 170 mg/dL — ABNORMAL HIGH (ref <150)
VLDL: 34 mg/dL (ref 0–40)

## 2011-11-11 MED ORDER — GABAPENTIN 300 MG PO CAPS: ORAL_CAPSULE | ORAL | Status: DC

## 2011-11-11 MED ORDER — TRAMADOL HCL 50 MG PO TABS: ORAL_TABLET | ORAL | Status: DC

## 2011-11-11 NOTE — Progress Notes (Signed)
  Subjective:    Patient ID: George Watson, male    DOB: [birth year], 66 y.o.   MRN: [id number]  HPI patient in the followup his hyperlipidemia. Over the fall he felt weak fatigued and was unclear what was going on. He gradually over the last few months.He. He actually feels well. He had an ultrasound this morning to followup on a rising AFP for his hepatitis C and it was essentially normal without evidence of hepatoma.    Review of Systems this He has no specific complaints he has no chest pain nausea vomiting or other significant complaints     Objective:   Physical Exam HEENT exam is unremarkable. His chest is clear. Heart regular rate no murmurs. Abdomen reveals some mild discomfort in the right upper abdomen without mass to        Assessment & Plan:  Assessment chronic hepatitis C under the care of Dr. [name] is stable at the present time. We'll check routine labs for his lipids. We'll place the future we'll order for labs

## 2011-11-12 ENCOUNTER — Encounter: Payer: Self-pay | Admitting: Family Medicine

## 2011-12-09 ENCOUNTER — Telehealth: Payer: Self-pay

## 2011-12-09 ENCOUNTER — Other Ambulatory Visit: Payer: Self-pay | Admitting: Physician Assistant

## 2011-12-09 NOTE — Telephone Encounter (Signed)
Pt states that during his last OV Dr.Daub had increased his tramadol from 1/2 a table twice a day to 1 tablet twice a day, pt states that when he called his pharmacy to receive a refill they informed him that he would need to wait 16 more days and he then looked at his rx bottle and realized that the increase was never made to the rx it was still written for the 1/2 a tablet twice a day. Please advise. Pharmacy: [facility] on [address]

## 2011-12-10 ENCOUNTER — Other Ambulatory Visit: Payer: Self-pay | Admitting: Family Medicine

## 2011-12-10 DIAGNOSIS — M549 Dorsalgia, unspecified: Secondary | ICD-10-CM

## 2011-12-10 MED ORDER — TRAMADOL HCL 50 MG PO TABS: ORAL_TABLET | ORAL | Status: DC

## 2011-12-10 NOTE — Telephone Encounter (Signed)
Told pt that George Watson his Rx.

## 2011-12-10 NOTE — Telephone Encounter (Signed)
Our prescription states that he was given # 60 on 11/11/11. There is no mention of increasing to 1 po bid.  However, Dr. Cleta Alberts is out of town and I will refill it because it has been one month since his last refill if he did get it 11/11/11.

## 2012-01-02 ENCOUNTER — Emergency Department (HOSPITAL_BASED_OUTPATIENT_CLINIC_OR_DEPARTMENT_OTHER)
Admission: EM | Admit: 2012-01-02 | Discharge: 2012-01-03 | Disposition: A | Discharge status: Home / Self Care | Payer: PRIVATE HEALTH INSURANCE | Attending: Emergency Medicine | Admitting: Emergency Medicine

## 2012-01-02 ENCOUNTER — Encounter (HOSPITAL_BASED_OUTPATIENT_CLINIC_OR_DEPARTMENT_OTHER): Payer: Self-pay | Admitting: *Deleted

## 2012-01-02 DIAGNOSIS — N201 Calculus of ureter: Secondary | ICD-10-CM | POA: Insufficient documentation

## 2012-01-02 DIAGNOSIS — N3090 Cystitis, unspecified without hematuria: Secondary | ICD-10-CM | POA: Insufficient documentation

## 2012-01-02 DIAGNOSIS — I10 Essential (primary) hypertension: Secondary | ICD-10-CM | POA: Insufficient documentation

## 2012-01-02 DIAGNOSIS — N200 Calculus of kidney: Secondary | ICD-10-CM

## 2012-01-02 DIAGNOSIS — Z8619 Personal history of other infectious and parasitic diseases: Secondary | ICD-10-CM | POA: Insufficient documentation

## 2012-01-02 DIAGNOSIS — K5730 Diverticulosis of large intestine without perforation or abscess without bleeding: Secondary | ICD-10-CM | POA: Insufficient documentation

## 2012-01-02 DIAGNOSIS — R1031 Right lower quadrant pain: Secondary | ICD-10-CM | POA: Insufficient documentation

## 2012-01-02 DIAGNOSIS — Z8546 Personal history of malignant neoplasm of prostate: Secondary | ICD-10-CM | POA: Insufficient documentation

## 2012-01-02 LAB — URINALYSIS, ROUTINE W REFLEX MICROSCOPIC
Glucose, UA: NEGATIVE mg/dL
Specific Gravity, Urine: 1.02 (ref 1.005–1.030)
Urobilinogen, UA: 0.2 mg/dL (ref 0.0–1.0)
pH: 6 (ref 5.0–8.0)

## 2012-01-02 LAB — URINE MICROSCOPIC-ADD ON

## 2012-01-02 NOTE — ED Notes (Signed)
Pt c/o right lower abd pain x 1 week.  

## 2012-01-03 ENCOUNTER — Emergency Department (HOSPITAL_BASED_OUTPATIENT_CLINIC_OR_DEPARTMENT_OTHER): Payer: PRIVATE HEALTH INSURANCE

## 2012-01-03 LAB — CBC
MCH: 32.9 pg (ref 26.0–34.0)
Platelets: 194 10*3/uL (ref 150–400)
RBC: 4.47 MIL/uL (ref 4.22–5.81)

## 2012-01-03 LAB — BASIC METABOLIC PANEL
CO2: 22 mEq/L (ref 19–32)
Chloride: 104 mEq/L (ref 96–112)
GFR calc non Af Amer: 69 mL/min — ABNORMAL LOW (ref >90)
Glucose, Bld: 104 mg/dL — ABNORMAL HIGH (ref 70–99)
Potassium: 3.9 mEq/L (ref 3.5–5.1)
Sodium: 135 mEq/L (ref 135–145)

## 2012-01-03 LAB — DIFFERENTIAL
Eosinophils Absolute: 0.5 10*3/uL (ref 0.0–0.7)
Lymphocytes Relative: 44 % (ref 12–46)
Lymphs Abs: 4 10*3/uL (ref 0.7–4.0)
Neutro Abs: 3.5 10*3/uL (ref 1.7–7.7)
Neutrophils Relative %: 38 % — ABNORMAL LOW (ref 43–77)

## 2012-01-03 LAB — HEPATIC FUNCTION PANEL
ALT: 70 U/L — ABNORMAL HIGH (ref 0–53)
Bilirubin, Direct: 0.1 mg/dL (ref 0.0–0.3)
Indirect Bilirubin: 0.2 mg/dL — ABNORMAL LOW (ref 0.3–0.9)

## 2012-01-03 MED ORDER — HYDROCODONE-ACETAMINOPHEN 5-500 MG PO TABS: 1.0000 | ORAL_TABLET | Freq: Four times a day (QID) | ORAL | Status: AC | PRN

## 2012-01-03 MED ORDER — CIPROFLOXACIN HCL 500 MG PO TABS
500.0000 mg | ORAL_TABLET | Freq: Once | ORAL | Status: AC
  Administered 2012-01-03: 500 mg via ORAL
  Filled 2012-01-03: qty 1

## 2012-01-03 MED ORDER — CIPROFLOXACIN HCL 500 MG PO TABS: 500.0000 mg | ORAL_TABLET | Freq: Two times a day (BID) | ORAL | Status: AC

## 2012-01-03 NOTE — ED Provider Notes (Signed)
History     CSN: [id number]  Arrival date & time 01/02/12  2252   First MD Initiated Contact with Patient 01/03/12 0036      Chief Complaint  Patient presents with  . Abdominal Pain    (Consider location/radiation/quality/duration/timing/severity/associated sxs/prior treatment) Patient is a 66 y.o. male presenting with hematuria and cramps. The history is provided by the patient. No language interpreter was used.  Hematuria This is a recurrent problem. The current episode started today. The problem is unchanged. He describes the hematuria as gross hematuria. The hematuria occurs throughout his entire urinary stream. He reports no clotting in his urine stream. His pain is at a severity of 2/10. The pain is mild. He describes his urine color as tea colored. Irritative symptoms include frequency. Irritative symptoms do not include urgency. Obstructive symptoms do not include incomplete emptying, an intermittent stream, straining or a weak stream. Associated symptoms include abdominal pain. Pertinent negatives include no dysuria, fever, flank pain, genital pain, inability to urinate, nausea or vomiting. His sexual activity is non-contributory to the current illness. His past medical history is significant for kidney stones.  Abdominal Cramping The primary symptoms of the illness include abdominal pain. The primary symptoms of the illness do not include fever, nausea, vomiting, diarrhea or dysuria. The current episode started 13 to 24 hours ago. The onset of the illness was gradual. The problem has been gradually worsening.  The abdominal pain began 13 to24 hours ago. The pain came on gradually. The abdominal pain has been gradually improving since its onset. Pain Location: right inguinal region. The abdominal pain does not radiate. The abdominal pain is relieved by nothing. Exacerbated by: nothing.  Associated with: nothing. The patient has not had a change in bowel habit. Risk factors: none.  Additional symptoms associated with the illness include hematuria and frequency. Symptoms associated with the illness do not include urgency or back pain. Significant associated medical issues include liver disease.  Cramping in the right inguinal region this evening with tea colored urine  Past Medical History  Diagnosis Date  . Hepatitis C 10/1996  . Essential hypertension, benign   . H/O drug abuse     multisubstance  . Other and unspecified hyperlipidemia   . HSV-2 (herpes simplex virus 2) infection   . Elevated PSA   . COPD (chronic obstructive pulmonary disease)   . BPH (benign prostatic hypertrophy)   . Inguinal hernia     right  . Hx of biopsy 08/2004    liver  . DDD (degenerative disc disease), lumbosacral   . Prostate cancer     Past Surgical History  Procedure Date  . Fracture ribs     3  . Fracture surgery     clavicle  . Cervical spine surgery     History reviewed. No pertinent family history.  History  Substance Use Topics  . Smoking status: Former Games developer  . Smokeless tobacco: Not on file  . Alcohol Use: No      Review of Systems  Constitutional: Negative for fever.  Gastrointestinal: Positive for abdominal pain. Negative for nausea, vomiting and diarrhea.  Genitourinary: Positive for frequency and hematuria. Negative for dysuria, urgency, flank pain and incomplete emptying.  Musculoskeletal: Negative for back pain.  Skin: Positive for rash.  All other systems reviewed and are negative.    Allergies  Penicillins and Sulfa antibiotics  Home Medications   Current Outpatient Rx  Name Route Sig Dispense Refill  . ALBUTEROL SULFATE (2.5 MG/3ML) 0.083% IN  NEBU Nebulization Take 2.5 mg by nebulization every 6 (six) hours as needed.    . ASPIRIN 81 MG PO TABS Oral Take 81 mg by mouth daily.    . BECLOMETHASONE DIPROPIONATE 80 MCG/ACT IN AERS Inhalation Inhale 1 puff into the lungs as needed.    Marland Kitchen BENICAR 20 MG PO TABS  TAKE ONE TABLET BY MOUTH DAILY  30 tablet 1    No refills available  . CIPROFLOXACIN HCL 500 MG PO TABS Oral Take 1 tablet (500 mg total) by mouth 2 (two) times daily. One po bid x 7 days 14 tablet 0  . DUTASTERIDE 0.5 MG PO CAPS Oral Take 0.5 mg by mouth daily.    Marland Kitchen GABAPENTIN 300 MG PO CAPS  Take one tablet in the morning 2 tablets at bedtime 90 capsule 11  . GLUCOSAMINE 1500 COMPLEX PO Oral Take by mouth.    Marland Kitchen HYDROCODONE-ACETAMINOPHEN 5-500 MG PO TABS Oral Take 1 tablet by mouth every 6 (six) hours as needed for pain. 10 tablet 0  . FISH OIL 1000 MG PO CAPS Oral Take by mouth 3 (three) times daily.    Marland Kitchen OMEPRAZOLE 20 MG PO CPDR Oral Take 20 mg by mouth 4 (four) times daily.    . SERTRALINE HCL 50 MG PO TABS Oral Take 50 mg by mouth daily.    . TRAMADOL HCL 50 MG PO TABS  Patient to take one half tablet twice a day. 60 tablet 0    No refills available    BP 138/98  Pulse 64  Temp 98 F (36.7 C) (Oral)  Resp 16  Ht 5\' 11"  (1.803 m)  Wt 165 lb (74.844 kg)  BMI 23.01 kg/m2  SpO2 100%  Physical Exam  Constitutional: He is oriented to person, place, and time. He appears well-developed and well-nourished. No distress.  HENT:  Head: Normocephalic and atraumatic.  Mouth/Throat: Oropharynx is clear and moist.  Eyes: Conjunctivae are normal. Pupils are equal, round, and reactive to light.  Neck: Normal range of motion. Neck supple.  Cardiovascular: Normal rate and regular rhythm.   Pulmonary/Chest: Effort normal and breath sounds normal.  Abdominal: Soft. Bowel sounds are normal. There is no tenderness. There is no rebound and no guarding.  Musculoskeletal: Normal range of motion.  Neurological: He is alert and oriented to person, place, and time.  Skin: Skin is warm and dry.       Eruptions of BUE linear cw poison ivy dermatitis  Psychiatric: He has a normal mood and affect.    ED Course  Procedures (including critical care time)  Labs Reviewed  URINALYSIS, ROUTINE W REFLEX MICROSCOPIC - Abnormal; Notable for  the following:    Color, Urine RED (*)  BIOCHEMICALS MAY BE AFFECTED BY COLOR   APPearance CLOUDY (*)     Hgb urine dipstick LARGE (*)     Protein, ur 30 (*)     Leukocytes, UA SMALL (*)     All other components within normal limits  CBC - Abnormal; Notable for the following:    MCHC 36.3 (*)     All other components within normal limits  DIFFERENTIAL - Abnormal; Notable for the following:    Neutrophils Relative 38 (*)     Monocytes Absolute 1.1 (*)     All other components within normal limits  BASIC METABOLIC PANEL - Abnormal; Notable for the following:    Glucose, Bld 104 (*)     BUN 28 (*)     GFR calc  non Af Amer 69 (*)     GFR calc Af Amer 80 (*)     All other components within normal limits  HEPATIC FUNCTION PANEL - Abnormal; Notable for the following:    AST 47 (*)     ALT 70 (*)     Indirect Bilirubin 0.2 (*)     All other components within normal limits  URINE MICROSCOPIC-ADD ON  URINE CULTURE   Ct Abdomen Pelvis Wo Contrast  01/03/2012  *RADIOLOGY REPORT*  Clinical Data: Right lower quadrant abdominal pain and flank pain; hematuria.  CT ABDOMEN AND PELVIS WITHOUT CONTRAST  Technique:  Multidetector CT imaging of the abdomen and pelvis was performed following the standard protocol without intravenous contrast.  Comparison: Abdominal ultrasound performed 11/11/2011, and MRI of the lumbar spine performed 10/16/2010  Findings: The visualized lung bases are clear.  The liver and spleen are unremarkable in appearance.  The gallbladder is within normal limits.  The pancreas and adrenal glands are unremarkable.  There are two obstructing stones noted within the mid to distal right ureter, approximately 8 cm above the right vesicoureteral junction. The two stones abut each other within the right ureter, the more superior stone measuring 9 x 5 mm and the more inferior stone measuring 6 x 5 mm.  There is resultant very mild right-sided hydronephrosis.  No significant asymmetric  perinephric stranding is seen.  Scattered nonobstructing left renal stones are seen, measuring up to 4 mm in size.  Nonspecific perinephric stranding is noted bilaterally.  No free fluid is identified.  The small bowel is unremarkable in appearance.  The stomach is within normal limits.  There is minimal ectasia of the distal abdominal aorta; scattered calcification is noted along the abdominal aorta and its branches.  No aneurysmal dilatation is seen.  The appendix is normal in caliber and contains air, without evidence for appendicitis.  Scattered diverticulosis is noted along the sigmoid colon, without evidence of diverticulitis.  The colon is otherwise grossly unremarkable in appearance.  The bladder is mildly distended; mild soft tissue stranding is noted about the bladder, possibly reflecting cystitis.  The prostate remains normal in size, with minimal calcification.  No inguinal lymphadenopathy is seen.  No acute osseous abnormalities are identified.  Mild facet disease is noted at the lower lumbar spine.  There is grade 1 retrolisthesis and mild disc space narrowing at L2-L3, and grade 1 retrolisthesis of L3 on L4.  IMPRESSION:  1.  Two prominent obstructing stones within the mid to distal right ureter, 8 cm above the right vesicoureteral junction.  The two stones abut each other within the right ureter, the more superior stone measuring 9 x 5 mm, and the more inferior stone measuring 6 x 5 mm.  Very mild associated right-sided hydronephrosis seen. 2.  Scattered nonobstructing left renal stones, measuring up to 4 mm in size. 3.  Mild soft tissue stranding about the bladder may reflect cystitis. 4.  Scattered calcification along the abdominal aorta and its branches. 5.  Mild degenerative change noted along the lumbar spine. 6.  Diverticulosis along the sigmoid colon, without evidence of diverticulitis.  Original Report Authenticated By: Tonia Ghent, M.D.     1. Kidney stones   2. Cystitis        MDM  Kidney stone and cystitis.  Will treat for both.  Return for fevers > 101 intractable pain intractable vomiting or any concerns.  Strain urine. Take all antibiotics.  Follow up in 7 days with urology.  Patient and wife verbalize understanding and agree to follow up.  Continue calamine for poison ivy        Virgal Warmuth K Cassidee Deats-Rasch, MD 01/03/12 [phone]

## 2012-01-03 NOTE — Discharge Instructions (Signed)
Kidney Stones Kidney stones (ureteral lithiasis) are solid masses that form inside your kidneys. The intense pain is caused by the stone moving through the kidney, ureter, bladder, and urethra (urinary tract). When the stone moves, the ureter starts to spasm around the stone. The stone is usually passed in the urine.  HOME CARE  Drink enough fluids to keep your pee (urine) clear or pale yellow. This helps to get the stone out.   Strain all pee through the provided strainer. Do not pee without peeing through the strainer, not even once. If you pee the stone out, catch it. The stone may be as small as a grain of salt. Take this to your doctor.   Only take medicine as told by your doctor.   Follow up with your doctor as told.   Get follow-up X-rays as told by your doctor.  GET HELP RIGHT AWAY IF:   Your pain does not get better with medicine.   You have a fever.   Your pain increases and gets worse over 18 hours.   You have new belly (abdominal) pain.   You feel faint or pass out.  MAKE SURE YOU:   Understand these instructions.   Will watch your condition.   Will get help right away if you are not doing well or get worse.  Document Released: 12/24/2007 Document Revised: 06/26/2011 Document Reviewed: 05/04/2009 [employer] Patient Information 2012 [employer]. 

## 2012-01-04 LAB — URINE CULTURE
Colony Count: NO GROWTH
Culture  Setup Time: 201306150553

## 2012-01-19 ENCOUNTER — Ambulatory Visit (INDEPENDENT_AMBULATORY_CARE_PROVIDER_SITE_OTHER): Payer: PRIVATE HEALTH INSURANCE | Admitting: Family Medicine

## 2012-01-19 VITALS — BP 107/63 | HR 60 | Temp 97.8°F | Resp 16 | Ht 71.5 in | Wt 168.2 lb

## 2012-01-19 DIAGNOSIS — J441 Chronic obstructive pulmonary disease with (acute) exacerbation: Secondary | ICD-10-CM

## 2012-01-19 DIAGNOSIS — J019 Acute sinusitis, unspecified: Secondary | ICD-10-CM

## 2012-01-19 DIAGNOSIS — J329 Chronic sinusitis, unspecified: Secondary | ICD-10-CM

## 2012-01-19 MED ORDER — LEVOFLOXACIN 500 MG PO TABS: 500.0000 mg | ORAL_TABLET | Freq: Every day | ORAL | Status: AC

## 2012-01-19 MED ORDER — PREDNISONE 20 MG PO TABS: ORAL_TABLET | ORAL | Status: DC

## 2012-01-19 NOTE — Progress Notes (Signed)
@[email or web address] @   Patient ID: George Watson MRN: [id number], DOB: 01-Jun-1946, 66 y.o. Date of Encounter: 01/19/2012, 11:33 AM  Primary Physician: Lucilla Edin, MD  Chief Complaint:  Chief Complaint  Patient presents with  . sinus pressure, difficulty breathing    HPI: 66 y.o. year old male presents with 3 day history of nasal congestion, post nasal drip, sore throat, sinus pressure, and cough. Afebrile. No chills. Nasal congestion thick and green/yellow. Sinus pressure is the worst symptom. Cough is productive secondary to post nasal drip and not associated with time of day. Ears feel full, leading to sensation of muffled hearing. Has tried OTC cold preps without success. No GI complaints. Appetite fair Recent bloody nose No recent antibiotics, recent travels, or sick contacts   No leg trauma, sedentary periods, h/o cancer, or tobacco use.  Past Medical History  Diagnosis Date  . Hepatitis C 10/1996  . Essential hypertension, benign   . H/O drug abuse     multisubstance  . Other and unspecified hyperlipidemia   . HSV-2 (herpes simplex virus 2) infection   . Elevated PSA   . COPD (chronic obstructive pulmonary disease)   . BPH (benign prostatic hypertrophy)   . Inguinal hernia     right  . Hx of biopsy 08/2004    liver  . DDD (degenerative disc disease), lumbosacral   . Prostate cancer      Home Meds: Prior to Admission medications   Medication Sig Start Date End Date Taking? Authorizing Provider  albuterol (PROVENTIL) (2.5 MG/3ML) 0.083% nebulizer solution Take 2.5 mg by nebulization every 6 (six) hours as needed.   Yes Historical Provider, MD  aspirin 81 MG tablet Take 81 mg by mouth daily.   Yes Historical Provider, MD  beclomethasone (QVAR) 80 MCG/ACT inhaler Inhale 1 puff into the lungs as needed.   Yes Historical Provider, MD  BENICAR 20 MG tablet TAKE ONE TABLET BY MOUTH DAILY 12/09/11  Yes Ryan M Dunn, PA-C  dutasteride (AVODART) 0.5 MG capsule Take 0.5 mg by mouth  daily.   Yes Historical Provider, MD  gabapentin (NEURONTIN) 300 MG capsule Take one tablet in the morning 2 tablets at bedtime 11/11/11  Yes Collene Gobble, MD  Glucosamine-Chondroit-Vit C-Mn (GLUCOSAMINE 1500 COMPLEX PO) Take by mouth.   Yes Historical Provider, MD  Omega-3 Fatty Acids (FISH OIL) 1000 MG CAPS Take by mouth 3 (three) times daily.   Yes Historical Provider, MD  omeprazole (PRILOSEC) 20 MG capsule Take 20 mg by mouth 4 (four) times daily.   Yes Historical Provider, MD  traMADol (ULTRAM) 50 MG tablet Patient to take one half tablet twice a day. 12/10/11  Yes Ryan M Dunn, PA-C  sertraline (ZOLOFT) 50 MG tablet Take 50 mg by mouth daily.    Historical Provider, MD    Allergies:  Allergies  Allergen Reactions  . Penicillins   . Sulfa Antibiotics     History   Social History  . Marital Status: Married    Spouse Name: N/A    Number of Children: N/A  . Years of Education: N/A   Occupational History  . Not on file.   Social History Main Topics  . Smoking status: Former Games developer  . Smokeless tobacco: Not on file  . Alcohol Use: No  . Drug Use: Not on file  . Sexually Active: Not on file   Other Topics Concern  . Not on file   Social History Narrative   Married to [employer].  Review of Systems: Constitutional: negative for chills, fever, night sweats or weight changes Cardiovascular: negative for chest pain or palpitations Respiratory: negative for hemoptysis, wheezing Abdominal: negative for abdominal pain, nausea, vomiting or diarrhea Dermatological: negative for rash Neurologic: negative for headache   Physical Exam:  Blood pressure 107/63, pulse 60, temperature 97.8 F (36.6 C), temperature source Oral, resp. rate 16, height 5' 11.5" (1.816 m), weight 168 lb 3.2 oz (76.295 kg), SpO2 98.00%., Body mass index is 23.13 kg/(m^2). General: Well developed, well nourished, in no acute distress. Head: Normocephalic, atraumatic, eyes without discharge, sclera  non-icteric, nares are congested. Bilateral auditory canals clear, TM's are without perforation, pearly grey with reflective cone of light bilaterally. Serous effusion bilaterally behind TM's. Maxillary sinus TTP. Oral cavity moist, dentition normal. Posterior pharynx with post nasal drip and mild erythema. No peritonsillar abscess or tonsillar exudate. Neck: Supple. No thyromegaly. Full ROM. No lymphadenopathy. Lungs:  bilateral to auscultation with wheezes, rales, and rhonchi. Breathing is unlabored.  Heart: RRR with S1 S2. No murmurs, rubs, or gallops appreciated. Msk:  Strength and tone normal for age. Extremities: No clubbing or cyanosis. No edema. Neuro: Alert and oriented X 3. Moves all extremities spontaneously. CNII-XII grossly in tact. Psych:  Responds to questions appropriately with a normal affect.      ASSESSMENT AND PLAN:  66 y.o. year old male with sinusitis and COPD flare - -Mucinex -Tylenol/Motrin prn -Rest/fluids -RTC precautions -RTC 3-5 days if no improvement 1. Sinusitis  predniSONE (DELTASONE) 20 MG tablet, levofloxacin (LEVAQUIN) 500 MG tablet  2. COPD exacerbation  predniSONE (DELTASONE) 20 MG tablet, levofloxacin (LEVAQUIN) 500 MG tablet   ' Signed, Elvina Sidle, MD 01/19/2012 11:33 AM

## 2012-01-28 ENCOUNTER — Other Ambulatory Visit: Payer: Self-pay | Admitting: Urology

## 2012-01-29 ENCOUNTER — Encounter (HOSPITAL_COMMUNITY): Payer: Self-pay | Admitting: Pharmacy Technician

## 2012-01-29 ENCOUNTER — Encounter (HOSPITAL_COMMUNITY): Payer: Self-pay | Admitting: *Deleted

## 2012-01-29 NOTE — Progress Notes (Signed)
1530 Pre op instructions completed asked to bring blue folder sign all forms, ,insurance card ,have driver to take home,, no Aspirin, Advil, Ibuprofen, Toradol 72 hours prior to procedure.  Takes 1 1/2 hour for procedure. Wear comfortable clothing. NPO past MN 02-01-12

## 2012-02-01 NOTE — H&P (Signed)
History of Present Illness   Nephrolithiasis: He has a remote history of nephrolithiasis.  A CT done in 6/13 revealed several nonobstructing left renal calculi in addition to his 2 right ureteral stones.  Interval history: He was seen recently after having developed right testicular pain with a. A KUB revealed 2 stones in his mid right ureter 6 mm in size. He was placed on medical expulsive therapy. He did have some discomfort mainly in the right testicular region several days ago but has been pain-free since. He's not seen a stone pass.    Past Medical History Problems  1. History of  Anxiety (Symptom) 300.00 2. History of  Arthritis V13.4 3. History of  Bilateral Inguinal Hernia 550.92 4. History of  Chronic Hepatitis, C Virus 070.54 5. History of  Heartburn 787.1 6. History of  Hypercholesterolemia 272.0 7. History of  Hypertension 401.9 8. History of  Tuberculosis V12.01  Surgical History Problems  1. History of  Neck Surgery 2. History of  Percutaneous Needle Liver Biopsy  Current Meds 1. Avodart 0.5 MG Oral Capsule; TAKE ONE CAPSULE BY MOUTH DAILY; Therapy: 14Jun2011 to  (Last Rx:17Jun2013)  Requested for: 17Jun2013 2. Benicar 20 MG Oral Tablet; Therapy: 14Jan2011 to 3. Centrum Silver TABS; Therapy: (Recorded:05May2011) to 4. Ciprofloxacin HCl 500 MG Oral Tablet; Therapy: 15Jun2013 to 5. Fish Oil CAPS; Therapy: (Recorded:05May2011) to 6. Gabapentin 300 MG Oral Capsule; Therapy: 25Mar2013 to 7. Glucosamine Chondroitin TABS; Therapy: (Recorded:05May2011) to 8. Nabumetone 750 MG Oral Tablet; Therapy: 01Mar2012 to 9. Omeprazole 40 MG Oral Capsule Delayed Release; Therapy: (Recorded:04Jan2013) to 10. Oxycodone-Acetaminophen 5-325 MG Oral Tablet; take 1 or 2 tablets q 4-6 hours prn pain;   Therapy: 21Jun2013 to (Last Rx:21Jun2013) 11. ProAir HFA AERS; Therapy: (Recorded:05May2011) to 12. Qvar 80 MCG/ACT Inhalation Aerosol Solution; Therapy: 26Dec2010 to 13. Skelaxin 800 MG Oral  Tablet; Therapy: (Recorded:05May2011) to 14. Spiriva HandiHaler 18 MCG Inhalation Capsule; Therapy: 20Apr2011 to 15. Tamsulosin HCl 0.4 MG Oral Capsule; TAKE 1 CAPSULE Daily to facilitate stone passage;   Therapy: 21Jun2013 to (Evaluate:19Sep2013)  Requested for: 21Jun2013; Last Rx:21Jun2013  Allergies Medication  1. No Known Drug Allergies  Family History Problems  1. Paternal history of  Death In The Family Father age 7; esophageal cancer 2. Maternal history of  Death In The Family Mother age 49; leukemia  Social History Problems  1. Caffeine Use daily 2. Marital History - Currently Married 3. Never A Smoker 4. Occupation: retired Nurse, children's  5. History of  Alcohol Use 6. History of  Tobacco Use   Review of Systems Genitourinary and gastrointestinal system(s) were reviewed and pertinent findings if present are noted.  Genitourinary: nocturia and post-void dribbling.  Gastrointestinal: heartburn.     Vitals Vital Signs  BMI Calculated: 22.96 BSA Calculated: 1.94 Height: 5 ft 11 in Weight: 164 lb  Blood Pressure: 125 / 68 Heart Rate: 55  Physical Exam Constitutional: Well nourished and well developed . No acute distress.  ENT:. The ears and nose are normal in appearance.  Neck: The appearance of the neck is normal and no neck mass is present.  Pulmonary: No respiratory distress and normal respiratory rhythm and effort.  Cardiovascular:. No peripheral edema.  Abdomen: No CVA tenderness.  Skin: Normal skin turgor, no visible rash and no visible skin lesions.  Neuro/Psych:. Mood and affect are appropriate.   Assessment Assessed  1. Proximal Ureteral Stone On The Right 592.1 2. Nephrolithiasis Of The Left Kidney 592.0   We discussed the fact that the stones  are still present and although they have moved there fairly large and likely will not pass spontaneously based on their size. I therefore have discussed treatment options with him. We went over lithotripsy and  ureteroscopy and I discussed each of these in detail. I went over the risks, complications and advantages as well as disadvantages of each of these forms of therapy. He has elected to proceed with lithotripsy at this time.   Plan    He is scheduled for lithotripsy of his right distal ureteral calculi.

## 2012-02-02 ENCOUNTER — Encounter (HOSPITAL_COMMUNITY): Payer: Self-pay | Admitting: *Deleted

## 2012-02-02 ENCOUNTER — Encounter (HOSPITAL_COMMUNITY)
Admission: RE | Disposition: A | Discharge status: Home / Self Care | Payer: Self-pay | Source: Ambulatory Visit | Attending: Urology

## 2012-02-02 ENCOUNTER — Ambulatory Visit (HOSPITAL_COMMUNITY)
Admission: RE | Admit: 2012-02-02 | Discharge: 2012-02-02 | Disposition: A | Discharge status: Home / Self Care | Payer: PRIVATE HEALTH INSURANCE | Source: Ambulatory Visit | Attending: Urology | Admitting: Urology

## 2012-02-02 ENCOUNTER — Ambulatory Visit (HOSPITAL_COMMUNITY): Payer: PRIVATE HEALTH INSURANCE

## 2012-02-02 DIAGNOSIS — N201 Calculus of ureter: Secondary | ICD-10-CM | POA: Insufficient documentation

## 2012-02-02 DIAGNOSIS — J4489 Other specified chronic obstructive pulmonary disease: Secondary | ICD-10-CM | POA: Insufficient documentation

## 2012-02-02 DIAGNOSIS — I10 Essential (primary) hypertension: Secondary | ICD-10-CM | POA: Insufficient documentation

## 2012-02-02 DIAGNOSIS — J449 Chronic obstructive pulmonary disease, unspecified: Secondary | ICD-10-CM | POA: Insufficient documentation

## 2012-02-02 DIAGNOSIS — Z79899 Other long term (current) drug therapy: Secondary | ICD-10-CM | POA: Insufficient documentation

## 2012-02-02 DIAGNOSIS — E7800 Pure hypercholesterolemia, unspecified: Secondary | ICD-10-CM | POA: Insufficient documentation

## 2012-02-02 DIAGNOSIS — N200 Calculus of kidney: Secondary | ICD-10-CM | POA: Insufficient documentation

## 2012-02-02 SURGERY — LITHOTRIPSY, ESWL
Anesthesia: LOCAL | Laterality: Right

## 2012-02-02 MED ORDER — DIPHENHYDRAMINE HCL 25 MG PO CAPS
ORAL_CAPSULE | ORAL | Status: AC
  Filled 2012-02-02: qty 1

## 2012-02-02 MED ORDER — OXYCODONE HCL 5 MG PO TABS: 10.0000 mg | ORAL_TABLET | ORAL | Status: DC | PRN

## 2012-02-02 MED ORDER — SODIUM CHLORIDE 0.9 % IV SOLN
INTRAVENOUS | Status: DC
  Administered 2012-02-02: 1000 mL via INTRAVENOUS

## 2012-02-02 MED ORDER — DIPHENHYDRAMINE HCL 25 MG PO CAPS
25.0000 mg | ORAL_CAPSULE | ORAL | Status: AC
  Administered 2012-02-02: 25 mg via ORAL

## 2012-02-02 MED ORDER — CIPROFLOXACIN HCL 500 MG PO TABS
ORAL_TABLET | ORAL | Status: AC
  Filled 2012-02-02: qty 1

## 2012-02-02 MED ORDER — DIAZEPAM 5 MG PO TABS
ORAL_TABLET | ORAL | Status: AC
  Filled 2012-02-02: qty 2

## 2012-02-02 MED ORDER — OXYCODONE-ACETAMINOPHEN 5-325 MG PO TABS: 1.0000 | ORAL_TABLET | ORAL | Status: AC | PRN

## 2012-02-02 MED ORDER — DIAZEPAM 5 MG PO TABS
10.0000 mg | ORAL_TABLET | ORAL | Status: AC
  Administered 2012-02-02: 10 mg via ORAL

## 2012-02-02 MED ORDER — CIPROFLOXACIN HCL 500 MG PO TABS
500.0000 mg | ORAL_TABLET | ORAL | Status: AC
  Administered 2012-02-02: 500 mg via ORAL

## 2012-02-02 NOTE — Interval H&P Note (Signed)
History and Physical Interval Note:  02/02/2012 9:27 AM  George Watson  has presented today for surgery, with the diagnosis of right ureteral stone  The various methods of treatment have been discussed with the patient and family. After consideration of risks, benefits and other options for treatment, the patient has consented to  Procedure(s) (LRB): EXTRACORPOREAL SHOCK WAVE LITHOTRIPSY (ESWL) (Right) as a surgical intervention .  The patient's history has been reviewed, patient examined, no change in status, stable for surgery.  I have reviewed the patients' chart and labs.  Questions were answered to the patient's satisfaction.     Garnett Farm

## 2012-02-02 NOTE — Op Note (Signed)
See [facility] OP note scanned into chart. 

## 2012-02-03 ENCOUNTER — Encounter (HOSPITAL_COMMUNITY): Payer: Self-pay

## 2012-02-04 ENCOUNTER — Telehealth: Payer: Self-pay

## 2012-02-04 NOTE — Telephone Encounter (Signed)
The patient called to make sure that the lab orders were in the system so that he could have his labs prior to his appointment on 02/17/12 with Dr. Cleta Alberts.  Please call the patient to verify that lab orders are in system at [phone].

## 2012-02-04 NOTE — Telephone Encounter (Signed)
Dr Cleta Alberts, I see where you put in 3 future orders for pt labs back on 4/23. Are these the only labs you want pt to have bf his appt?

## 2012-02-04 NOTE — Telephone Encounter (Signed)
Pt notified that orders were in the computer

## 2012-02-04 NOTE — Telephone Encounter (Signed)
Patient needs in order to have a CBC glucose hemoglobin A1c lipid panel and seen at on his followup visit. These labs should be done prior to his office visit .

## 2012-02-05 ENCOUNTER — Other Ambulatory Visit (INDEPENDENT_AMBULATORY_CARE_PROVIDER_SITE_OTHER): Payer: PRIVATE HEALTH INSURANCE | Admitting: Emergency Medicine

## 2012-02-05 ENCOUNTER — Other Ambulatory Visit: Payer: Self-pay | Admitting: Physician Assistant

## 2012-02-05 DIAGNOSIS — B182 Chronic viral hepatitis C: Secondary | ICD-10-CM

## 2012-02-05 DIAGNOSIS — E785 Hyperlipidemia, unspecified: Secondary | ICD-10-CM

## 2012-02-05 LAB — CBC WITH DIFFERENTIAL/PLATELET
Basophils Relative: 0 % (ref 0–1)
Eosinophils Absolute: 0.2 10*3/uL (ref 0.0–0.7)
Hemoglobin: 14.1 g/dL (ref 13.0–17.0)
MCH: 32.5 pg (ref 26.0–34.0)
MCHC: 35.3 g/dL (ref 30.0–36.0)
Monocytes Absolute: 1.5 10*3/uL — ABNORMAL HIGH (ref 0.1–1.0)
Monocytes Relative: 15 % — ABNORMAL HIGH (ref 3–12)
Neutrophils Relative %: 62 % (ref 43–77)
RDW: 13.4 % (ref 11.5–15.5)

## 2012-02-06 LAB — COMPREHENSIVE METABOLIC PANEL
ALT: 64 U/L — ABNORMAL HIGH (ref 0–53)
AST: 44 U/L — ABNORMAL HIGH (ref 0–37)
Albumin: 4.2 g/dL (ref 3.5–5.2)
Alkaline Phosphatase: 78 U/L (ref 39–117)
BUN: 30 mg/dL — ABNORMAL HIGH (ref 6–23)
CO2: 20 mEq/L (ref 19–32)
Calcium: 10.7 mg/dL — ABNORMAL HIGH (ref 8.4–10.5)
Chloride: 112 mEq/L (ref 96–112)
Creat: 1.28 mg/dL (ref 0.50–1.35)
Glucose, Bld: 121 mg/dL — ABNORMAL HIGH (ref 70–99)
Potassium: 4.2 mEq/L (ref 3.5–5.3)
Sodium: 140 mEq/L (ref 135–145)
Total Bilirubin: 0.7 mg/dL (ref 0.3–1.2)
Total Protein: 6.9 g/dL (ref 6.0–8.3)

## 2012-02-06 LAB — LIPID PANEL
Cholesterol: 186 mg/dL (ref 0–200)
HDL: 41 mg/dL (ref >39)
LDL Cholesterol: 124 mg/dL — ABNORMAL HIGH (ref 0–99)
Total CHOL/HDL Ratio: 4.5 Ratio
Triglycerides: 106 mg/dL (ref <150)
VLDL: 21 mg/dL (ref 0–40)

## 2012-02-10 ENCOUNTER — Telehealth: Payer: Self-pay

## 2012-02-10 NOTE — Telephone Encounter (Signed)
Jasmine December from Dr [facility] office received some of pt labs that were requested but didn't receive all of them via fax, please send lab info and fax to [phone] any questions please contact Jasmine December [phone]

## 2012-02-10 NOTE — Telephone Encounter (Signed)
Labs from July 18 printed and re-faxed with confirmation to Dr. Jennye Boroughs office.

## 2012-02-12 ENCOUNTER — Encounter: Payer: Self-pay | Admitting: Emergency Medicine

## 2012-02-17 ENCOUNTER — Ambulatory Visit (INDEPENDENT_AMBULATORY_CARE_PROVIDER_SITE_OTHER): Payer: PRIVATE HEALTH INSURANCE | Admitting: Emergency Medicine

## 2012-02-17 VITALS — BP 138/76 | HR 62 | Temp 96.8°F | Resp 18 | Ht 71.5 in | Wt 167.0 lb

## 2012-02-17 DIAGNOSIS — B1920 Unspecified viral hepatitis C without hepatic coma: Secondary | ICD-10-CM

## 2012-02-17 DIAGNOSIS — I10 Essential (primary) hypertension: Secondary | ICD-10-CM

## 2012-02-17 DIAGNOSIS — E119 Type 2 diabetes mellitus without complications: Secondary | ICD-10-CM

## 2012-02-17 NOTE — Progress Notes (Signed)
  Subjective:    Patient ID: George Watson, male    DOB: [birth year], 66 y.o.   MRN: [id number]  HPI patient has been doing well recently. He's been very active and recently competed in a mud run. He has been bothered with kidney stones which required lithotripsy. He has been able to keep his weight down exercise regularly.    Review of Systems     Objective:   Physical Exam HEENT exam is unremarkable neck supple chest clear heart regular rate no murmur   Results for orders placed in visit on 02/17/12  POCT GLYCOSYLATED HEMOGLOBIN (HGB A1C)      Component Value Range   Hemoglobin A1C 5.9         Assessment & Plan:  Patient does have low blood pressure. We'll go ahead and stop his benicar. He recently has been on Flomax and this maybe contributing to his symptoms. Were not able to demonstrate definite orthostatic hypotension but I do feel cough and Benicar as indicated. Her blood pressure at home and we will restart Benicar if his pressure starts to climb. No change in other medicines. Recheck in 3 months.

## 2012-04-15 ENCOUNTER — Ambulatory Visit: Payer: Medicare Other

## 2012-04-15 ENCOUNTER — Ambulatory Visit (INDEPENDENT_AMBULATORY_CARE_PROVIDER_SITE_OTHER): Payer: Medicare Other | Admitting: Emergency Medicine

## 2012-04-15 VITALS — BP 122/64 | HR 64 | Temp 98.4°F | Resp 14 | Ht 71.0 in | Wt 167.4 lb

## 2012-04-15 DIAGNOSIS — R109 Unspecified abdominal pain: Secondary | ICD-10-CM

## 2012-04-15 DIAGNOSIS — B1920 Unspecified viral hepatitis C without hepatic coma: Secondary | ICD-10-CM

## 2012-04-15 DIAGNOSIS — R197 Diarrhea, unspecified: Secondary | ICD-10-CM

## 2012-04-15 DIAGNOSIS — R10819 Abdominal tenderness, unspecified site: Secondary | ICD-10-CM

## 2012-04-15 DIAGNOSIS — A6920 Lyme disease, unspecified: Secondary | ICD-10-CM

## 2012-04-15 LAB — POCT CBC
Granulocyte percent: 49.8 %G (ref 37–80)
HCT, POC: 52 % (ref 43.5–53.7)
Hemoglobin: 16.3 g/dL (ref 14.1–18.1)
POC Granulocyte: 4.8 (ref 2–6.9)
RDW, POC: 12.7 %

## 2012-04-15 LAB — POCT URINALYSIS DIPSTICK
Bilirubin, UA: NEGATIVE
Ketones, UA: NEGATIVE
Leukocytes, UA: NEGATIVE
Protein, UA: NEGATIVE

## 2012-04-15 MED ORDER — DICYCLOMINE HCL 20 MG PO TABS: 20.0000 mg | ORAL_TABLET | Freq: Four times a day (QID) | ORAL | Status: DC

## 2012-04-15 NOTE — Progress Notes (Signed)
  Subjective:    Patient ID: George Watson, male    DOB: 10-Feb-1946, 66 y.o.   MRN: [id number]  HPI 66 year old male presents with issues with his bowel movements. First not regular then having cramps and a lot of gas.  Not sleeping well at night. Denies loss of appetite. Began being aching after stopping Gabapentin Pt had a question about if he was tested for Lymes disease last year and about titer.    Review of Systems     Objective:   Physical Exam HEENT exam is unremarkable. Neck is supple chest exam reveals dry rales rhonchi in the bases bilaterally cardiac exam is unremarkable. Abdominal exam reveals some guarding in the upper abdomen but no rebound no masses felt . There is a reducible right inguinal hernia no definite hernia on the left.  Results for orders placed in visit on 04/15/12  POCT CBC      Component Value Range   WBC 9.7  4.6 - 10.2 K/uL   Lymph, poc 3.7 (*) 0.6 - 3.4   POC LYMPH PERCENT 37.9  10 - 50 %L   MID (cbc) 1.2 (*) 0 - 0.9   POC MID % 12.3 (*) 0 - 12 %M   POC Granulocyte 4.8  2 - 6.9   Granulocyte percent 49.8  37 - 80 %G   RBC 5.17  4.69 - 6.13 M/uL   Hemoglobin 16.3  14.1 - 18.1 g/dL   HCT, POC 40.9  81.1 - 53.7 %   MCV 100.6 (*) 80 - 97 fL   MCH, POC 31.5 (*) 27 - 31.2 pg   MCHC 31.3 (*) 31.8 - 35.4 g/dL   RDW, POC 91.4     Platelet Count, POC 232  142 - 424 K/uL   MPV 9.7  0 - 99.8 fL  POCT URINALYSIS DIPSTICK      Component Value Range   Color, UA yellow     Clarity, UA clear     Glucose, UA negative     Bilirubin, UA negative     Ketones, UA negative     Spec Grav, UA 1.020     Blood, UA negative     pH, UA 7.0     Protein, UA negative     Urobilinogen, UA 0.2     Nitrite, UA negative     Leukocytes, UA Negative    [facility] reading (PRIMARY) by  Dr.Dalaina Tates no acute changes. There is no free air noted        Assessment & Plan:

## 2012-04-15 NOTE — Progress Notes (Deleted)
  Subjective:    Patient ID: George Watson, male    DOB: 06-13-1946, 66 y.o.   MRN: [id number]  HPI    Review of Systems     Objective:   Physical Exam        Assessment & Plan:

## 2012-04-16 LAB — B. BURGDORFI ANTIBODIES: B burgdorferi Ab IgG+IgM: 0.41 {ISR}

## 2012-04-21 ENCOUNTER — Ambulatory Visit (INDEPENDENT_AMBULATORY_CARE_PROVIDER_SITE_OTHER): Payer: PRIVATE HEALTH INSURANCE | Admitting: Emergency Medicine

## 2012-04-21 VITALS — BP 158/70 | HR 63 | Temp 97.0°F | Resp 16 | Ht 71.0 in | Wt 168.0 lb

## 2012-04-21 DIAGNOSIS — K469 Unspecified abdominal hernia without obstruction or gangrene: Secondary | ICD-10-CM

## 2012-04-21 DIAGNOSIS — K4090 Unilateral inguinal hernia, without obstruction or gangrene, not specified as recurrent: Secondary | ICD-10-CM

## 2012-04-21 NOTE — Progress Notes (Signed)
  Subjective:    Patient ID: George Watson, male    DOB: [birth year], 66 y.o.   MRN: [id number]  HPI patient with a known right inguinal hernia returned with increasing pain in his right groin. He's had no nausea or vomiting    Review of Systems     Objective:   Physical Exam there is an easily reducible right inguinal hernia present there is mild tenderness at the external ring        Assessment & Plan:  Patient needs referral to general surgery for repair

## 2012-04-22 ENCOUNTER — Other Ambulatory Visit: Payer: Self-pay | Admitting: Emergency Medicine

## 2012-04-22 ENCOUNTER — Telehealth: Payer: Self-pay | Admitting: Family Medicine

## 2012-04-22 NOTE — Telephone Encounter (Signed)
Called and notified PT that [facility] can see Pt 05/06/12 at 9:15am. Pt voiced understanding and I advised him to RTC if he has any trouble before his appointment with them. JF

## 2012-04-26 ENCOUNTER — Other Ambulatory Visit: Payer: Self-pay | Admitting: Gastroenterology

## 2012-04-26 DIAGNOSIS — B1920 Unspecified viral hepatitis C without hepatic coma: Secondary | ICD-10-CM

## 2012-05-06 ENCOUNTER — Ambulatory Visit (INDEPENDENT_AMBULATORY_CARE_PROVIDER_SITE_OTHER): Payer: PRIVATE HEALTH INSURANCE | Admitting: General Surgery

## 2012-05-06 ENCOUNTER — Encounter (INDEPENDENT_AMBULATORY_CARE_PROVIDER_SITE_OTHER): Payer: Self-pay | Admitting: General Surgery

## 2012-05-06 VITALS — BP 112/60 | HR 64 | Temp 97.1°F | Resp 20 | Ht 71.0 in | Wt 171.6 lb

## 2012-05-06 DIAGNOSIS — K4090 Unilateral inguinal hernia, without obstruction or gangrene, not specified as recurrent: Secondary | ICD-10-CM

## 2012-05-06 NOTE — Progress Notes (Signed)
Patient ID: ZAHID ARY, male   DOB: [birth year], 66 y.o.   MRN: [id number]  Chief Complaint  Patient presents with  . Other    eval ING hernia    HPI George Watson is a 66 y.o. male.  This patient is referred by Dr. Cleta Alberts for evaluation of an inguinal hernia. He says that he has a history of bilateral inguinal hernias and he has been told that he had this since at least 2008. These have been asymptomatic until about 4 weeks ago he noticed a bulge in his right groin which lasted about 5 days and had some mild discomfort but no obvious pain. This has since resolved and he has no complaints. He says that his bowels are normal he has no nausea vomiting or obstructive symptoms. He is followed by a gastroenterologist who has scheduled him for a colonoscopy on November 11. He also has a history of prostate cancer but has not had any prostate surgery and is followed by [facility] for this. And HPI  Past Medical History  Diagnosis Date  . Hepatitis C 10/1996  . Essential hypertension, benign   . H/O drug abuse     multisubstance  . Other and unspecified hyperlipidemia   . HSV-2 (herpes simplex virus 2) infection   . Elevated PSA   . COPD (chronic obstructive pulmonary disease)   . BPH (benign prostatic hypertrophy)   . Inguinal hernia     right  . Hx of biopsy 08/2004    liver  . DDD (degenerative disc disease), lumbosacral   . Prostate cancer   . Chronic kidney disease     Past Surgical History  Procedure Date  . Fracture ribs     3  . Fracture surgery     clavicle  . Cervical spine surgery     Family History  Problem Relation Age of Onset  . Cancer Sister     breast    Social History History  Substance Use Topics  . Smoking status: Former Games developer  . Smokeless tobacco: Not on file  . Alcohol Use: No    Allergies  Allergen Reactions  . Sulfa Antibiotics Other (See Comments)    Pt states he is not allergic to anything.    Current Outpatient Prescriptions    Medication Sig Dispense Refill  . acetaminophen (TYLENOL) 500 MG tablet Take 500 mg by mouth every 6 (six) hours as needed. For back pain      . albuterol (PROVENTIL) (2.5 MG/3ML) 0.083% nebulizer solution Take 2.5 mg by nebulization every 6 (six) hours as needed. For shortness of breath      . aspirin 81 MG tablet Take 81 mg by mouth daily.      . beclomethasone (QVAR) 80 MCG/ACT inhaler Inhale 1 puff into the lungs daily with breakfast.       . dutasteride (AVODART) 0.5 MG capsule Take 0.5 mg by mouth daily with breakfast.       . famotidine (PEPCID) 20 MG tablet Take 20 mg by mouth 2 (two) times daily.      Marland Kitchen gabapentin (NEURONTIN) 300 MG capsule Take one tablet in the morning 2 tablets at bedtime  90 capsule  11  . Glucosamine-Chondroit-Vit C-Mn (GLUCOSAMINE 1500 COMPLEX PO) Take 2 tablets by mouth daily with breakfast.       . Omega-3 Fatty Acids (FISH OIL) 1000 MG CAPS Take 1 capsule by mouth 3 (three) times daily.       . Tamsulosin HCl (  FLOMAX) 0.4 MG CAPS Take 0.4 mg by mouth daily after breakfast.      . traMADol (ULTRAM) 50 MG tablet TAKE ONE HALF TABLET TWICE A DAY.  50 tablet  1    Review of Systems Review of Systems All other review of systems negative or noncontributory except as stated in the HPI  Blood pressure 112/60, pulse 64, temperature 97.1 F (36.2 C), temperature source Temporal, resp. rate 20, height 5\' 11"  (1.803 m), weight 171 lb 9.6 oz (77.837 kg).  Physical Exam Physical Exam Physical Exam  Vitals reviewed. Constitutional: He is oriented to person, place, and time. He appears well-developed and well-nourished. No distress.  HENT:  Head: Normocephalic and atraumatic.  Mouth/Throat: No oropharyngeal exudate.  Eyes: Conjunctivae and EOM are normal. Pupils are equal, round, and reactive to light. Right eye exhibits no discharge. Left eye exhibits no discharge. No scleral icterus.  Neck: Normal range of motion. No tracheal deviation present.  Cardiovascular:  Normal rate, regular rhythm and normal heart sounds.   Pulmonary/Chest: Effort normal and breath sounds normal. No stridor. No respiratory distress. He has no wheezes. He has no rales. He exhibits no tenderness.  Abdominal: Soft. Bowel sounds are normal. He exhibits no distension and no mass. There is no tenderness. There is no rebound and no guarding. he does have a small, reducible right inguinal hernia. I do not appreciate any left inguinal hernia on exam today. He has bilateral testicles which feel normal but in his right hemiscrotum he also has another cystic or mass lesion along his cord of uncertain etiology. I think that this is separate from his hernia. Musculoskeletal: Normal range of motion. He exhibits no edema and no tenderness.  Neurological: He is alert and oriented to person, place, and time.  Skin: Skin is warm and dry. No rash noted. He is not diaphoretic. No erythema. No pallor.  Psychiatric: He has a normal mood and affect. His behavior is normal. Judgment and thought content normal.    Data Reviewed CT scan of the abdomen  Assessment    Right inguinal hernia He does have a easily reducible right inguinal hernia which is asymptomatic at this time. I am not certain that he has a left inguinal hernia. We discussed the options for continued observation versus surgical repair by both open or laparoscopic techniques with the pros and cons of each. I recommended that he hold off on surgery until his colonoscopy in November and in the meantime I recommended that he be evaluated by his urologist for this right scrotal mass prior to surgery. If this is something that requires surgery we could potentially take care of this at the same time. He will go ahead and see his primary care physician and have his colonoscopy and he will followup with me in one month to discuss results and to discuss if he would like to go ahead and schedule hernia repair    Plan    Proceed with further workup of  the right scrotal mass and colonoscopy and we will see him back in one month to discuss results and to discuss possible hernia repair       Kacin Dancy DAVID 05/06/2012, 9:26 AM

## 2012-05-18 ENCOUNTER — Encounter: Payer: Self-pay | Admitting: Emergency Medicine

## 2012-05-18 ENCOUNTER — Ambulatory Visit (INDEPENDENT_AMBULATORY_CARE_PROVIDER_SITE_OTHER): Payer: PRIVATE HEALTH INSURANCE | Admitting: Emergency Medicine

## 2012-05-18 VITALS — BP 120/84 | HR 57 | Temp 97.8°F | Resp 16 | Ht 71.0 in | Wt 170.8 lb

## 2012-05-18 DIAGNOSIS — B1920 Unspecified viral hepatitis C without hepatic coma: Secondary | ICD-10-CM

## 2012-05-18 DIAGNOSIS — E119 Type 2 diabetes mellitus without complications: Secondary | ICD-10-CM

## 2012-05-18 DIAGNOSIS — H6120 Impacted cerumen, unspecified ear: Secondary | ICD-10-CM

## 2012-05-18 DIAGNOSIS — Z23 Encounter for immunization: Secondary | ICD-10-CM

## 2012-05-18 LAB — COMPREHENSIVE METABOLIC PANEL
Alkaline Phosphatase: 54 U/L (ref 39–117)
Creat: 0.8 mg/dL (ref 0.50–1.35)
Glucose, Bld: 107 mg/dL — ABNORMAL HIGH (ref 70–99)
Sodium: 138 mEq/L (ref 135–145)
Total Bilirubin: 0.8 mg/dL (ref 0.3–1.2)
Total Protein: 7.5 g/dL (ref 6.0–8.3)

## 2012-05-18 LAB — LIPID PANEL
HDL: 34 mg/dL — ABNORMAL LOW (ref >39)
LDL Cholesterol: 138 mg/dL — ABNORMAL HIGH (ref 0–99)
Total CHOL/HDL Ratio: 6.2 Ratio
Triglycerides: 190 mg/dL — ABNORMAL HIGH (ref <150)
VLDL: 38 mg/dL (ref 0–40)

## 2012-05-18 NOTE — Progress Notes (Signed)
  Subjective:    Patient ID: George Watson, male    DOB: 09-09-1945, 66 y.o.   MRN: [id number]  HPI patient has been to see the surgeon with plans on having surgery in the future. The surgeon recommended he have his colonoscopy done prior to his surgery. He sees Dr. Vernie Ammons for his prostate cancer and is currently on Flomax and Ditropan. He he has been having some discomfort in his ear    Review of Systems     Objective:   Physical Exam there is wax impaction of the left external auditory canal. Chest exam shows basilar rhonchi unchanged from previous cardiac exam is unremarkable. He has an easily reducible right inguinal hernia as well as a bifid right testicle no other masses are palpable       Assessment & Plan:  Routine labs were done today he has only gotten his flu shot. He is going to see Dr. Vernie Ammons and Dr. [name] prior surgery. He'll have a screening colonoscopy done prior to surgery.

## 2012-05-21 ENCOUNTER — Other Ambulatory Visit: Payer: Self-pay | Admitting: Emergency Medicine

## 2012-05-21 ENCOUNTER — Telehealth: Payer: Self-pay

## 2012-05-21 DIAGNOSIS — M549 Dorsalgia, unspecified: Secondary | ICD-10-CM

## 2012-05-21 MED ORDER — METAXALONE 800 MG PO TABS: 800.0000 mg | ORAL_TABLET | Freq: Three times a day (TID) | ORAL | Status: DC

## 2012-05-25 ENCOUNTER — Telehealth: Payer: Self-pay | Admitting: Emergency Medicine

## 2012-05-25 NOTE — Telephone Encounter (Signed)
I spoke to patient and he will pick up today.

## 2012-05-25 NOTE — Telephone Encounter (Signed)
Check and see if patient was able to get his prescription for Skelaxin, that I ordered

## 2012-05-26 ENCOUNTER — Telehealth: Payer: Self-pay

## 2012-05-26 NOTE — Telephone Encounter (Signed)
We had sent a letter to pt w/outside lab results, but wasn't sure pt got a copy of the labs done in house. Mailed copy of all labs to pt.

## 2012-05-26 NOTE — Telephone Encounter (Signed)
PT STATES WE USUALLY MAIL HIM A COPY OF HIS LAB WORK WE HAD DONE AND HE HASN'T RECEIVED IT YET. PLEASE MAIL A COPY FOR HIS RECORDS YOU MAY REACH PT AT [phone]

## 2012-06-04 ENCOUNTER — Encounter (INDEPENDENT_AMBULATORY_CARE_PROVIDER_SITE_OTHER): Payer: PRIVATE HEALTH INSURANCE | Admitting: General Surgery

## 2012-06-08 ENCOUNTER — Ambulatory Visit (INDEPENDENT_AMBULATORY_CARE_PROVIDER_SITE_OTHER): Payer: PRIVATE HEALTH INSURANCE | Admitting: General Surgery

## 2012-06-08 ENCOUNTER — Encounter (INDEPENDENT_AMBULATORY_CARE_PROVIDER_SITE_OTHER): Payer: Self-pay | Admitting: General Surgery

## 2012-06-08 VITALS — BP 122/60 | HR 84 | Temp 96.8°F | Resp 18 | Ht 71.0 in | Wt 173.4 lb

## 2012-06-08 DIAGNOSIS — K4090 Unilateral inguinal hernia, without obstruction or gangrene, not specified as recurrent: Secondary | ICD-10-CM

## 2012-06-08 NOTE — Progress Notes (Signed)
Subjective:     Patient ID: George Watson, male   DOB: [birth year], 66 y.o.   MRN: [id number]  HPI This patient has been seen previously by me for evaluation of a right inguinal hernia. He denies any significant change from our last visit with regard to his hernia. He still has a right inguinal bulge with any significant discomfort in the area. He had his colonoscopy and by patient report there was no significant findings. He also saw his urologist and he felt that this was a scrotal cyst and no intervention was needed. He returns today to discuss surgery.  Review of Systems     Objective:   Physical Exam No acute distress and nontoxic-appearing His abdomen is soft and nontender he has no evidence of umbilical hernia. He does have a small reducible right inguinal hernia on exam I do not appreciate any left inguinal hernia.    Assessment:     Right inguinal hernia-reducible I discussed with him the options for laparoscopic or open repair of or watchful waiting of this right inguinal hernia. I do not appreciate any left inguinal hernia and and so I would plan for open right inguinal hernia repair with mesh for the known hernia. We discussed the procedure and the pros and cons and risks and benefits of this. We discussed the risks of infection, bleeding, pain, scarring, recurrence, and nerve injury and chronic pain, injury to vas deferens and testicle. He expressed understanding and would like to see open right inguinal hernia repair with mesh    Plan:     We will plan for open right inguinal hernia repair with mesh.

## 2012-06-09 ENCOUNTER — Encounter (INDEPENDENT_AMBULATORY_CARE_PROVIDER_SITE_OTHER): Payer: Self-pay

## 2012-06-09 ENCOUNTER — Telehealth (INDEPENDENT_AMBULATORY_CARE_PROVIDER_SITE_OTHER): Payer: Self-pay | Admitting: General Surgery

## 2012-06-09 NOTE — Telephone Encounter (Signed)
Pt was transferred from surgery scheduling regarding post-op instructions for hernia surgery. Instructions/limitations were addressed with patient and questions were answered. Also post-op appointment was scheduled with Dr. Ronnette Juniper

## 2012-06-24 ENCOUNTER — Encounter (HOSPITAL_COMMUNITY): Payer: Self-pay | Admitting: Pharmacy Technician

## 2012-06-29 ENCOUNTER — Ambulatory Visit (HOSPITAL_COMMUNITY)
Admission: RE | Admit: 2012-06-29 | Discharge: 2012-06-29 | Disposition: A | Discharge status: Home / Self Care | Payer: PRIVATE HEALTH INSURANCE | Source: Ambulatory Visit | Attending: General Surgery | Admitting: General Surgery

## 2012-06-29 ENCOUNTER — Encounter (HOSPITAL_COMMUNITY): Payer: Self-pay

## 2012-06-29 ENCOUNTER — Encounter (HOSPITAL_COMMUNITY)
Admission: RE | Admit: 2012-06-29 | Discharge: 2012-06-29 | Disposition: A | Discharge status: Home / Self Care | Payer: PRIVATE HEALTH INSURANCE | Source: Ambulatory Visit | Attending: General Surgery | Admitting: General Surgery

## 2012-06-29 VITALS — BP 138/81 | HR 64 | Temp 97.9°F | Resp 16 | Ht 71.0 in | Wt 173.9 lb

## 2012-06-29 DIAGNOSIS — I10 Essential (primary) hypertension: Secondary | ICD-10-CM | POA: Insufficient documentation

## 2012-06-29 DIAGNOSIS — K219 Gastro-esophageal reflux disease without esophagitis: Secondary | ICD-10-CM | POA: Insufficient documentation

## 2012-06-29 DIAGNOSIS — Z01812 Encounter for preprocedural laboratory examination: Secondary | ICD-10-CM | POA: Insufficient documentation

## 2012-06-29 DIAGNOSIS — K4090 Unilateral inguinal hernia, without obstruction or gangrene, not specified as recurrent: Secondary | ICD-10-CM | POA: Insufficient documentation

## 2012-06-29 DIAGNOSIS — E119 Type 2 diabetes mellitus without complications: Secondary | ICD-10-CM | POA: Insufficient documentation

## 2012-06-29 DIAGNOSIS — J4489 Other specified chronic obstructive pulmonary disease: Secondary | ICD-10-CM | POA: Insufficient documentation

## 2012-06-29 DIAGNOSIS — Z01810 Encounter for preprocedural cardiovascular examination: Secondary | ICD-10-CM | POA: Insufficient documentation

## 2012-06-29 DIAGNOSIS — J449 Chronic obstructive pulmonary disease, unspecified: Secondary | ICD-10-CM | POA: Insufficient documentation

## 2012-06-29 HISTORY — DX: Gastro-esophageal reflux disease without esophagitis: K21.9

## 2012-06-29 HISTORY — DX: Displaced fracture of shaft of unspecified clavicle, initial encounter for closed fracture: S42.023A

## 2012-06-29 LAB — BASIC METABOLIC PANEL
Chloride: 102 mEq/L (ref 96–112)
Creatinine, Ser: 0.82 mg/dL (ref 0.50–1.35)
GFR calc Af Amer: >90 mL/min (ref >90)
GFR calc non Af Amer: >90 mL/min (ref >90)

## 2012-06-29 LAB — SURGICAL PCR SCREEN: Staphylococcus aureus: NEGATIVE

## 2012-06-29 LAB — CBC
MCV: 90.4 fL (ref 78.0–100.0)
Platelets: 232 10*3/uL (ref 150–400)
RDW: 12.1 % (ref 11.5–15.5)
WBC: 7.1 10*3/uL (ref 4.0–10.5)

## 2012-06-29 NOTE — Patient Instructions (Addendum)
[name]  06/29/2012   Your procedure is scheduled on: 07/01/12  Report to Darrin Nipper at [phone].  Call this number if you have problems the morning of surgery 336-: [phone]   Remember: please bring inhalers    Do not eat food or drink liquids After Midnight.     Take these medicines the morning of surgery with A SIP OF WATER: albuterol, Qvar inhaler, pepcid   Do not wear jewelry, make-up or nail polish.  Do not wear lotions, powders, or perfumes. You may wear deodorant.  Do not shave 48 hours prior to surgery. Men may shave face and neck.  Do not bring valuables to the hospital.  Contacts, dentures or bridgework may not be worn into surgery.  Leave suitcase in the car. After surgery it may be brought to your room.  For patients admitted to the hospital, checkout time is 11:00 AM the day of discharge.   Patients discharged the day of surgery will not be allowed to drive home.  Name and phone number of your driver: Nedra Hai [phone]   Special Instructions: Shower using CHG 2 nights before surgery and the night before surgery.  If you shower the day of surgery use CHG.  Use special wash - you have one bottle of CHG for all showers.  You should use approximately 1/3 of the bottle for each shower.   Please read over the following fact sheets that you were given: MRSA Information.  Birdie Sons, RN  pre op nurse call if needed [phone]    FAILURE TO FOLLOW THESE INSTRUCTIONS MAY RESULT IN CANCELLATION OF YOUR SURGERY   Patient Signature: ___________________________________________

## 2012-06-29 NOTE — Progress Notes (Signed)
06/29/12 1038  OBSTRUCTIVE SLEEP APNEA  Have you ever been diagnosed with sleep apnea through a sleep study? No  Do you snore loudly (loud enough to be heard through closed doors)?  1  Do you often feel tired, fatigued, or sleepy during the daytime? 0  Has anyone observed you stop breathing during your sleep? 0  Do you have, or are you being treated for high blood pressure? 1  BMI more than 35 kg/m2? 0  Age over 65 years old? 1  Neck circumference greater than 40 cm/18 inches? 0  Gender: 1  Obstructive Sleep Apnea Score 4   Score 4 or greater  Results sent to PCP

## 2012-06-29 NOTE — Progress Notes (Signed)
Anesthesia will see pt day of surgery

## 2012-06-29 NOTE — Progress Notes (Signed)
Stress test 09/01/11 Dr. Jacinto Halim on chart, ECHO 09/01/11 Dr. Jacinto Halim on chart, last office visit 08/08/11 Dr. Jacinto Halim on chart

## 2012-07-01 ENCOUNTER — Encounter (HOSPITAL_COMMUNITY)
Admission: RE | Disposition: A | Discharge status: Home / Self Care | Payer: Self-pay | Source: Ambulatory Visit | Attending: General Surgery

## 2012-07-01 ENCOUNTER — Encounter (HOSPITAL_COMMUNITY): Payer: Self-pay | Admitting: Anesthesiology

## 2012-07-01 ENCOUNTER — Ambulatory Visit (HOSPITAL_COMMUNITY): Payer: PRIVATE HEALTH INSURANCE | Admitting: Anesthesiology

## 2012-07-01 ENCOUNTER — Ambulatory Visit (HOSPITAL_COMMUNITY)
Admission: RE | Admit: 2012-07-01 | Discharge: 2012-07-01 | Disposition: A | Discharge status: Home / Self Care | Payer: PRIVATE HEALTH INSURANCE | Source: Ambulatory Visit | Attending: General Surgery | Admitting: General Surgery

## 2012-07-01 ENCOUNTER — Encounter (HOSPITAL_COMMUNITY): Payer: Self-pay | Admitting: *Deleted

## 2012-07-01 DIAGNOSIS — K4090 Unilateral inguinal hernia, without obstruction or gangrene, not specified as recurrent: Secondary | ICD-10-CM

## 2012-07-01 DIAGNOSIS — J449 Chronic obstructive pulmonary disease, unspecified: Secondary | ICD-10-CM | POA: Insufficient documentation

## 2012-07-01 DIAGNOSIS — N189 Chronic kidney disease, unspecified: Secondary | ICD-10-CM | POA: Insufficient documentation

## 2012-07-01 DIAGNOSIS — C61 Malignant neoplasm of prostate: Secondary | ICD-10-CM | POA: Insufficient documentation

## 2012-07-01 DIAGNOSIS — I129 Hypertensive chronic kidney disease with stage 1 through stage 4 chronic kidney disease, or unspecified chronic kidney disease: Secondary | ICD-10-CM | POA: Insufficient documentation

## 2012-07-01 DIAGNOSIS — J4489 Other specified chronic obstructive pulmonary disease: Secondary | ICD-10-CM | POA: Insufficient documentation

## 2012-07-01 DIAGNOSIS — E119 Type 2 diabetes mellitus without complications: Secondary | ICD-10-CM | POA: Insufficient documentation

## 2012-07-01 DIAGNOSIS — Z79899 Other long term (current) drug therapy: Secondary | ICD-10-CM | POA: Insufficient documentation

## 2012-07-01 DIAGNOSIS — K219 Gastro-esophageal reflux disease without esophagitis: Secondary | ICD-10-CM | POA: Insufficient documentation

## 2012-07-01 HISTORY — PX: INGUINAL HERNIA REPAIR: SHX194

## 2012-07-01 SURGERY — REPAIR, HERNIA, INGUINAL, ADULT
Anesthesia: General | Laterality: Right | Wound class: Clean

## 2012-07-01 MED ORDER — BUPIVACAINE HCL (PF) 0.25 % IJ SOLN
INTRAMUSCULAR | Status: AC
  Filled 2012-07-01: qty 30

## 2012-07-01 MED ORDER — PROPOFOL 10 MG/ML IV BOLUS
INTRAVENOUS | Status: DC | PRN
  Administered 2012-07-01: 200 mg via INTRAVENOUS

## 2012-07-01 MED ORDER — LACTATED RINGERS IV SOLN: INTRAVENOUS | Status: DC

## 2012-07-01 MED ORDER — LIDOCAINE HCL (CARDIAC) 20 MG/ML IV SOLN
INTRAVENOUS | Status: DC | PRN
  Administered 2012-07-01: 100 mg via INTRAVENOUS

## 2012-07-01 MED ORDER — DEXAMETHASONE SODIUM PHOSPHATE 4 MG/ML IJ SOLN
INTRAMUSCULAR | Status: DC | PRN
  Administered 2012-07-01: 8 mg via INTRAVENOUS

## 2012-07-01 MED ORDER — PHENYLEPHRINE HCL 10 MG/ML IJ SOLN
INTRAMUSCULAR | Status: DC | PRN
  Administered 2012-07-01: 80 ug via INTRAVENOUS

## 2012-07-01 MED ORDER — CEFAZOLIN SODIUM-DEXTROSE 2-3 GM-% IV SOLR
INTRAVENOUS | Status: AC
  Filled 2012-07-01: qty 50

## 2012-07-01 MED ORDER — LIDOCAINE-EPINEPHRINE 1 %-1:100000 IJ SOLN
INTRAMUSCULAR | Status: AC
  Filled 2012-07-01: qty 1

## 2012-07-01 MED ORDER — LACTATED RINGERS IV SOLN
INTRAVENOUS | Status: DC
  Administered 2012-07-01: 1000 mL via INTRAVENOUS

## 2012-07-01 MED ORDER — OXYCODONE-ACETAMINOPHEN 7.5-325 MG PO TABS: 1.0000 | ORAL_TABLET | ORAL | Status: DC | PRN

## 2012-07-01 MED ORDER — ONDANSETRON HCL 4 MG/2ML IJ SOLN
INTRAMUSCULAR | Status: DC | PRN
  Administered 2012-07-01: 4 mg via INTRAVENOUS

## 2012-07-01 MED ORDER — MIDAZOLAM HCL 5 MG/5ML IJ SOLN
INTRAMUSCULAR | Status: DC | PRN
  Administered 2012-07-01: 2 mg via INTRAVENOUS

## 2012-07-01 MED ORDER — HETASTARCH-ELECTROLYTES 6 % IV SOLN
INTRAVENOUS | Status: DC | PRN
  Administered 2012-07-01: 12:00:00 via INTRAVENOUS

## 2012-07-01 MED ORDER — FENTANYL CITRATE 0.05 MG/ML IJ SOLN: 25.0000 ug | INTRAMUSCULAR | Status: DC | PRN

## 2012-07-01 MED ORDER — LIDOCAINE-EPINEPHRINE 1 %-1:100000 IJ SOLN
INTRAMUSCULAR | Status: DC | PRN
  Administered 2012-07-01: 20 mL

## 2012-07-01 MED ORDER — CEFAZOLIN SODIUM-DEXTROSE 2-3 GM-% IV SOLR
2.0000 g | INTRAVENOUS | Status: AC
  Administered 2012-07-01: 2 g via INTRAVENOUS

## 2012-07-01 MED ORDER — OXYCODONE-ACETAMINOPHEN 5-325 MG PO TABS
1.0000 | ORAL_TABLET | ORAL | Status: DC | PRN
  Administered 2012-07-01: 1 via ORAL
  Filled 2012-07-01: qty 1

## 2012-07-01 MED ORDER — SUFENTANIL CITRATE 50 MCG/ML IV SOLN
INTRAVENOUS | Status: DC | PRN
  Administered 2012-07-01: 5 ug via INTRAVENOUS

## 2012-07-01 MED ORDER — BUPIVACAINE HCL (PF) 0.25 % IJ SOLN
INTRAMUSCULAR | Status: DC | PRN
  Administered 2012-07-01: 20 mL

## 2012-07-01 MED ORDER — 0.9 % SODIUM CHLORIDE (POUR BTL) OPTIME
TOPICAL | Status: DC | PRN
  Administered 2012-07-01: 1000 mL

## 2012-07-01 SURGICAL SUPPLY — 33 items
ADH SKN CLS APL DERMABOND .7 (GAUZE/BANDAGES/DRESSINGS) ×1
APL SKNCLS STERI-STRIP NONHPOA (GAUZE/BANDAGES/DRESSINGS)
BENZOIN TINCTURE PRP APPL 2/3 (GAUZE/BANDAGES/DRESSINGS) ×1 IMPLANT
BLADE SURG SZ10 CARB STEEL (BLADE) IMPLANT
CHLORAPREP W/TINT 26ML (MISCELLANEOUS) ×2 IMPLANT
CLOTH BEACON ORANGE TIMEOUT ST (SAFETY) ×2 IMPLANT
DECANTER SPIKE VIAL GLASS SM (MISCELLANEOUS) ×1 IMPLANT
DERMABOND ADVANCED (GAUZE/BANDAGES/DRESSINGS) ×1
DERMABOND ADVANCED .7 DNX12 (GAUZE/BANDAGES/DRESSINGS) ×1 IMPLANT
DRAIN PENROSE 18X1/2 LTX STRL (DRAIN) ×1 IMPLANT
DRAPE LAPAROTOMY TRNSV 102X78 (DRAPE) ×2 IMPLANT
DRAPE UTILITY XL STRL (DRAPES) ×2 IMPLANT
DRSG TEGADERM 4X4.75 (GAUZE/BANDAGES/DRESSINGS) ×1 IMPLANT
ELECT CAUTERY BLADE 6.4 (BLADE) ×2 IMPLANT
ELECT REM PT RETURN 9FT ADLT (ELECTROSURGICAL) ×2
ELECTRODE REM PT RTRN 9FT ADLT (ELECTROSURGICAL) ×1 IMPLANT
GLOVE BIO SURGEON STRL SZ7.5 (GLOVE) ×2 IMPLANT
GLOVE SURG SS PI 7.5 STRL IVOR (GLOVE) ×4 IMPLANT
GOWN STRL NON-REIN LRG LVL3 (GOWN DISPOSABLE) ×2 IMPLANT
GOWN STRL REIN XL XLG (GOWN DISPOSABLE) ×4 IMPLANT
KIT BASIN OR (CUSTOM PROCEDURE TRAY) ×2 IMPLANT
MESH PARIETEX PROGRIP RIGHT (Mesh General) ×1 IMPLANT
NEEDLE HYPO 22GX1.5 SAFETY (NEEDLE) ×2 IMPLANT
PACK GENERAL/GYN (CUSTOM PROCEDURE TRAY) ×2 IMPLANT
STRIP CLOSURE SKIN 1/2X4 (GAUZE/BANDAGES/DRESSINGS) ×1 IMPLANT
SUT MNCRL AB 4-0 PS2 18 (SUTURE) ×2 IMPLANT
SUT PROLENE 2 0 BLUE (SUTURE) ×5 IMPLANT
SUT VIC AB 2-0 SH 27 (SUTURE) ×6
SUT VIC AB 2-0 SH 27X BRD (SUTURE) ×2 IMPLANT
SUT VIC AB 3-0 SH 27 (SUTURE) ×2
SUT VIC AB 3-0 SH 27X BRD (SUTURE) IMPLANT
SYR CONTROL 10ML LL (SYRINGE) ×2 IMPLANT
TOWEL OR 17X26 10 PK STRL BLUE (TOWEL DISPOSABLE) ×2 IMPLANT

## 2012-07-01 NOTE — Anesthesia Preprocedure Evaluation (Signed)
Anesthesia Evaluation  Patient identified by MRN, date of birth, ID band Patient awake    Reviewed: Allergy & Precautions, H&P , NPO status , Patient's Chart, lab work & pertinent test results  Airway Mallampati: II TM Distance: >3 FB Neck ROM: full    Dental  (+) Caps and Dental Advisory Given,    Pulmonary COPD Mild COPD breath sounds clear to auscultation  Pulmonary exam normal       Cardiovascular Exercise Tolerance: Good hypertension, Pt. on medications Rhythm:regular Rate:Normal     Neuro/Psych negative neurological ROS  negative psych ROS   GI/Hepatic negative GI ROS, GERD-  Medicated and Controlled,(+) Hepatitis -, CNo trouble from hep C   Endo/Other  negative endocrine ROSdiabetesDiet controlled  Renal/GU negative Renal ROS  negative genitourinary   Musculoskeletal   Abdominal   Peds  Hematology negative hematology ROS (+)   Anesthesia Other Findings   Reproductive/Obstetrics negative OB ROS                           Anesthesia Physical Anesthesia Plan  ASA: II  Anesthesia Plan: General   Post-op Pain Management:    Induction: Intravenous  Airway Management Planned: LMA  Additional Equipment:   Intra-op Plan:   Post-operative Plan:   Informed Consent: I have reviewed the patients History and Physical, chart, labs and discussed the procedure including the risks, benefits and alternatives for the proposed anesthesia with the patient or authorized representative who has indicated his/her understanding and acceptance.   Dental Advisory Given  Plan Discussed with: CRNA and Surgeon  Anesthesia Plan Comments:         Anesthesia Quick Evaluation

## 2012-07-01 NOTE — Progress Notes (Signed)
Dr. Biagio Quint was notified that patient states his hernia is on the left side not the right. Dr. Biagio Quint will talk to the patient before a new consent is signed

## 2012-07-01 NOTE — Anesthesia Postprocedure Evaluation (Signed)
  Anesthesia Post-op Note  Patient: George Watson  Procedure(s) Performed: Procedure(s) (LRB): HERNIA REPAIR INGUINAL ADULT (Right)  Patient Location: PACU  Anesthesia Type: General  Level of Consciousness: awake and alert   Airway and Oxygen Therapy: Patient Spontanous Breathing  Post-op Pain: mild  Post-op Assessment: Post-op Vital signs reviewed, Patient's Cardiovascular Status Stable, Respiratory Function Stable, Patent Airway and No signs of Nausea or vomiting  Last Vitals:  Filed Vitals:   07/01/12 1245  BP: 131/67  Pulse: 65  Temp:   Resp: 12    Post-op Vital Signs: stable   Complications: No apparent anesthesia complications

## 2012-07-01 NOTE — Preoperative (Signed)
Beta Blockers   Reason not to administer Beta Blockers:Not Applicable 

## 2012-07-01 NOTE — Transfer of Care (Signed)
Immediate Anesthesia Transfer of Care Note  Patient: George Watson  Procedure(s) Performed: Procedure(s) (LRB) with comments: HERNIA REPAIR INGUINAL ADULT (Right)  Patient Location: PACU  Anesthesia Type:General  Level of Consciousness: awake, alert  and oriented  Airway & Oxygen Therapy: Patient Spontanous Breathing and Patient connected to face mask oxygen  Post-op Assessment: Report given to PACU RN and Post -op Vital signs reviewed and stable  Post vital signs: Reviewed and stable  Complications: No apparent anesthesia complications

## 2012-07-01 NOTE — Brief Op Note (Signed)
07/01/2012  12:32 PM  PATIENT:  George Watson  66 y.o. male  PRE-OPERATIVE DIAGNOSIS:  right inguinal hernia  POST-OPERATIVE DIAGNOSIS:  right inguinal hernia  PROCEDURE:  Procedure(s) (LRB) with comments: HERNIA REPAIR INGUINAL ADULT (Right)  SURGEON:  Surgeon(s) and Role:    * Lodema Pilot, DO - Primary  PHYSICIAN ASSISTANT:   ASSISTANTS: none   ANESTHESIA:   general  EBL:  Total I/O In: 1200 [I.V.:1100; IV Piggyback:100] Out: -   BLOOD ADMINISTERED:none  DRAINS: none   LOCAL MEDICATIONS USED:  MARCAINE    and LIDOCAINE   SPECIMEN:  No Specimen  DISPOSITION OF SPECIMEN:  N/A  COUNTS:  YES  TOURNIQUET:  * No tourniquets in log *  DICTATION: .Other Dictation: Dictation Number [id number]  PLAN OF CARE: Discharge to home after PACU  PATIENT DISPOSITION:  PACU - hemodynamically stable.   Delay start of Pharmacological VTE agent (>24hrs) due to surgical blood loss or risk of bleeding: no

## 2012-07-01 NOTE — H&P (Signed)
George Watson is a 66 y.o. male. This patient is referred by Dr. Cleta Alberts for evaluation of an inguinal hernia. He says that he has a history of bilateral inguinal hernias and he has been told that he had this since at least 2008. These have been asymptomatic until a few monthas ago he noticed a bulge in his right groin which lasted about 5 days and had some mild discomfort but no obvious pain. This has since resolved and he has no complaints. He says that his bowels are normal he has no nausea vomiting or obstructive symptoms. He is followed by a gastroenterologist who has scheduled him for a colonoscopy on November 11. He also has a history of prostate cancer but has not had any prostate surgery and is followed by [facility] for this. He has been evaluated by urologist for the scrotal mass and no intervention done. HPI  Past Medical History   Diagnosis  Date   .  Hepatitis C  10/1996   .  Essential hypertension, benign    .  H/O drug abuse      multisubstance   .  Other and unspecified hyperlipidemia    .  HSV-2 (herpes simplex virus 2) infection    .  Elevated PSA    .  COPD (chronic obstructive pulmonary disease)    .  BPH (benign prostatic hypertrophy)    .  Inguinal hernia      right   .  Hx of biopsy  08/2004     liver   .  DDD (degenerative disc disease), lumbosacral    .  Prostate cancer    .  Chronic kidney disease     Past Surgical History   Procedure  Date   .  Fracture ribs      3   .  Fracture surgery      clavicle   .  Cervical spine surgery     Family History   Problem  Relation  Age of Onset   .  Cancer  Sister       breast    Social History  History   Substance Use Topics   .  Smoking status:  Former Games developer   .  Smokeless tobacco:  Not on file   .  Alcohol Use:  No    Allergies   Allergen  Reactions   .  Sulfa Antibiotics  Other (See Comments)     Pt states he is not allergic to anything.    Current Outpatient Prescriptions   Medication  Sig   Dispense  Refill   .  acetaminophen (TYLENOL) 500 MG tablet  Take 500 mg by mouth every 6 (six) hours as needed. For back pain     .  albuterol (PROVENTIL) (2.5 MG/3ML) 0.083% nebulizer solution  Take 2.5 mg by nebulization every 6 (six) hours as needed. For shortness of breath     .  aspirin 81 MG tablet  Take 81 mg by mouth daily.     .  beclomethasone (QVAR) 80 MCG/ACT inhaler  Inhale 1 puff into the lungs daily with breakfast.     .  dutasteride (AVODART) 0.5 MG capsule  Take 0.5 mg by mouth daily with breakfast.     .  famotidine (PEPCID) 20 MG tablet  Take 20 mg by mouth 2 (two) times daily.     Marland Kitchen  gabapentin (NEURONTIN) 300 MG capsule  Take one tablet in the morning 2 tablets at bedtime  90 capsule  11   .  Glucosamine-Chondroit-Vit C-Mn (GLUCOSAMINE 1500 COMPLEX PO)  Take 2 tablets by mouth daily with breakfast.     .  Omega-3 Fatty Acids (FISH OIL) 1000 MG CAPS  Take 1 capsule by mouth 3 (three) times daily.     .  Tamsulosin HCl (FLOMAX) 0.4 MG CAPS  Take 0.4 mg by mouth daily after breakfast.     .  traMADol (ULTRAM) 50 MG tablet  TAKE ONE HALF TABLET TWICE A DAY.  50 tablet  1    Review of Systems  Review of Systems  All other review of systems negative or noncontributory except as stated in the HPI  Wt Readings from Last 3 Encounters:  06/29/12 173 lb 14.4 oz (78.881 kg)  06/08/12 173 lb 6.4 oz (78.654 kg)  05/18/12 170 lb 12.8 oz (77.474 kg)   Temp Readings from Last 3 Encounters:  07/01/12 97.6 F (36.4 C) Oral  07/01/12 97.6 F (36.4 C) Oral  06/29/12 97.9 F (36.6 C) Oral   BP Readings from Last 3 Encounters:  07/01/12 146/71  07/01/12 146/71  06/29/12 138/81   Pulse Readings from Last 3 Encounters:  07/01/12 56  07/01/12 56  06/29/12 64    Physical Exam  Physical Exam  Physical Exam  Vitals reviewed.  Constitutional: He is oriented to person, place, and time. He appears well-developed and well-nourished. No distress.  HENT:  Head: Normocephalic and  atraumatic.  Mouth/Throat: No oropharyngeal exudate.  Eyes: Conjunctivae and EOM are normal. Pupils are equal, round, and reactive to light. Right eye exhibits no discharge. Left eye exhibits no discharge. No scleral icterus.  Neck: Normal range of motion. No tracheal deviation present.  Cardiovascular: Normal rate, regular rhythm and normal heart sounds.  Pulmonary/Chest: Effort normal and breath sounds normal. No stridor. No respiratory distress. He has no wheezes. He has no rales. He exhibits no tenderness.  Abdominal: Soft. Bowel sounds are normal. He exhibits no distension and no mass. There is no tenderness. There is no rebound and no guarding. he does have a small, reducible right inguinal hernia. I do not appreciate any left inguinal hernia on exam today. He has bilateral testicles which feel normal but in his right hemiscrotum he also has another cystic or mass lesion along his cord of uncertain etiology. I think that this is separate from his hernia. Musculoskeletal: Normal range of motion. He exhibits no edema and no tenderness.  Neurological: He is alert and oriented to person, place, and time.  Skin: Skin is warm and dry. No rash noted. He is not diaphoretic. No erythema. No pallor.  Psychiatric: He has a normal mood and affect. His behavior is normal. Judgment and thought content normal.  Data Reviewed  CT scan of the abdomen  Assessment   Right inguinal hernia I again examined him and discussed the findings.  I again only feel a right inguinal hernia on exam.  He was thinking that it might be left side but I reexamined him and he definitely has a right hernia.  Site marked.  I also reviewed my prior notes and CT scan which confirm right hernia.  He was in agreement.  Risks of the procedure again discussed.  Risks of infection, bleeding, recurrence, nerve injury, injury to testicle or vas again discussed and he desires to proceed with open right inguinal hernia repair with mesh.

## 2012-07-02 ENCOUNTER — Encounter (HOSPITAL_COMMUNITY): Payer: Self-pay | Admitting: General Surgery

## 2012-07-02 NOTE — Op Note (Signed)
NAMEYIFAN, AUKER NO.:  [id number]  MEDICAL RECORD NO.:  [id number]  LOCATION:  [facility]                         FACILITY:  [facility]  PHYSICIAN:  Lodema Pilot, MD       DATE OF BIRTH:  [birth year]  DATE OF PROCEDURE:  07/01/2012 DATE OF DISCHARGE:  07/01/2012                              OPERATIVE REPORT   PROCEDURE:  Open repair of right inguinal hernia.  PREOPERATIVE DIAGNOSIS:  Right inguinal hernia.  POSTOPERATIVE DIAGNOSIS:  Right inguinal hernia.  SURGEON:  Lodema Pilot, MD  ASSISTANT:  None.  ANESTHESIA:  General LMA anesthesia with 40 mL of 1% lidocaine with epinephrine and 0.25% Marcaine in a 50:50 mixture.  FLUIDS:  1100 mL of crystalloid.  ESTIMATED BLOOD LOSS:  Minimal.  DRAINS:  None.  SPECIMENS:  None.  COMPLICATIONS:  None apparent.  FINDINGS:  Reducible moderate-sized direct inguinal hernia, repair with a right-sided ProGrip mesh.  INDICATION FOR PROCEDURE:  Mr. Glazier is a 66 year old male with a known history of what was thought to be bilateral inguinal hernia.  Few months ago, he noticed a bulge in his right groin as well as some discomfort which lasted few days but since then has been relatively asymptomatic. I saw him in the office and we discussed options for right inguinal hernia repair.  OPERATIVE DETAILS:  Mr. Rodino was seen evaluated in the preoperative area.  I again examined the patient and did not notice any left inguinal hernia and it confirmed the presence of a right inguinal hernia.  The surgical site was marked.  Prophylactic antibiotics were given.  He was taken to the operating room, placed on the table in supine position and general LMA anesthesia was obtained.  His scrotum and penis and groin were prepped and draped in a surgical fashion.  An oblique incision was made in the skin overlying the pubic tubercle and the inguinal canal and dissection carried down to the subcutaneous tissue using Bovie electrocautery.   External oblique fascia was incised along the length of the fibers to the external ring.  The ilioinguinal nerve was identified in its usual anatomic position and preserved along with the cord.  The cord was then dissected circumferentially and he was noted to have a moderate-sized direct inguinal hernia.  I reduced this and imbricated the floor with a running 2-0 Vicryl suture, taking care to avoid injury to underlying contents.  After the direct floor was imbricated and the hernia was not bulging, I then skeletonized the cord.  He did not have evidence of any indirect inguinal hernia.  I placed a right-sided Parietex ProGrip mesh around the spermatic cord with proper orientation and was sutured in place at the pubic tubercle with a 2-0 Prolene suture.  Tails of the mesh were wrapped around the spermatic cord and overlap and the tails of the mesh were tacked together.  The ring was tightened with a 2-0 Prolene suture, recreating a new internal ring.  A few sutures were placed lateral, superior, and inferiorly tacking the mesh to the underlying fascia, taking care to avoid injury to nerve root structures.  The mesh appeared to lie flat and covered all the indirect  and direct hernia defects.  The wound was irrigated with sterile saline solution and the external oblique fascia was approximated with running 2- 0 Vicryl suture.  The wound was injected with a total of 40 mL of 1% lidocaine with epinephrine and 0.25% Marcaine in a 50:50 mixture. Scarpa's fascia was approximated with 3-0 Vicryl suture and skin edges were approximated with 4-0 Monocryl subcuticular suture.  Skin was washed and dried and Dermabond was applied.  All sponge, needle, and instrument counts were correct at end of the case.  The patient tolerated the procedure well without apparent complication.          ______________________________ Lodema Pilot, MD     BL/MEDQ  D:  07/01/2012  T:  07/02/2012  Job:  [id number]

## 2012-07-16 ENCOUNTER — Encounter (INDEPENDENT_AMBULATORY_CARE_PROVIDER_SITE_OTHER): Payer: Self-pay | Admitting: General Surgery

## 2012-07-16 ENCOUNTER — Ambulatory Visit (INDEPENDENT_AMBULATORY_CARE_PROVIDER_SITE_OTHER): Payer: PRIVATE HEALTH INSURANCE | Admitting: General Surgery

## 2012-07-16 VITALS — BP 122/84 | HR 72 | Temp 97.3°F | Resp 14 | Ht 71.0 in | Wt 172.6 lb

## 2012-07-16 DIAGNOSIS — Z5189 Encounter for other specified aftercare: Secondary | ICD-10-CM

## 2012-07-16 DIAGNOSIS — Z4889 Encounter for other specified surgical aftercare: Secondary | ICD-10-CM

## 2012-07-16 NOTE — Progress Notes (Signed)
Subjective:     Patient ID: George Watson, male   DOB: 08/21/1945, 66 y.o.   MRN: [id number]  HPI This patient follows up 2 weeks status post open repair of right inguinal hernia. He still has some "naggy" pain in his groin but his discomfort is improving. He is tolerating regular diet his bowels are functioning normally and he is not taking any pain medication. He is asking when he can return to his regular exercises and activities  Review of Systems     Objective:   Physical Exam No distress and nontoxic-appearing His incision is healing well without sign of infection he has a normal healing ridge. There is no evidence of recurrent hernia with Valsalva    Assessment:     Status post open repair of right inguinal hernia-doing well He seems to be doing well from this procedure without any evidence of any postoperative complications. I recommended that he continue with another 2 weeks of light duty and at that time he can gradually increase activity as tolerated.    Plan:     2 weeks light-duty and then increase activity as tolerated and he can follow up with me when necessary basis

## 2012-07-19 ENCOUNTER — Telehealth (INDEPENDENT_AMBULATORY_CARE_PROVIDER_SITE_OTHER): Payer: Self-pay | Admitting: General Surgery

## 2012-07-19 NOTE — Telephone Encounter (Signed)
Pt called in explaining that he had a hernia repair done on 12/12 and he picked up a ladder over the weekend.  He explained that he is now a little tender but he wanted to know if he needs to look out for anything.  I explained that he needs to call us if he sees a bulge around the surgery site and if there is any extreme pain.  Advised him to do ibuprofen around the clock to help with any swelling and to use ice/heat and to let us know if things gets worse.  He said he understood.

## 2012-07-28 ENCOUNTER — Other Ambulatory Visit: Payer: Self-pay | Admitting: *Deleted

## 2012-07-28 DIAGNOSIS — E119 Type 2 diabetes mellitus without complications: Secondary | ICD-10-CM

## 2012-07-28 DIAGNOSIS — E785 Hyperlipidemia, unspecified: Secondary | ICD-10-CM

## 2012-07-28 DIAGNOSIS — Z79899 Other long term (current) drug therapy: Secondary | ICD-10-CM

## 2012-07-29 ENCOUNTER — Ambulatory Visit (INDEPENDENT_AMBULATORY_CARE_PROVIDER_SITE_OTHER): Payer: PRIVATE HEALTH INSURANCE | Admitting: Emergency Medicine

## 2012-07-29 VITALS — BP 124/75 | HR 70 | Temp 97.5°F | Resp 18 | Ht 71.0 in | Wt 173.0 lb

## 2012-07-29 DIAGNOSIS — J329 Chronic sinusitis, unspecified: Secondary | ICD-10-CM

## 2012-07-29 DIAGNOSIS — J029 Acute pharyngitis, unspecified: Secondary | ICD-10-CM

## 2012-07-29 DIAGNOSIS — R52 Pain, unspecified: Secondary | ICD-10-CM

## 2012-07-29 LAB — POCT INFLUENZA A/B
Influenza A, POC: NEGATIVE
Influenza B, POC: NEGATIVE

## 2012-07-29 MED ORDER — AZITHROMYCIN 250 MG PO TABS: ORAL_TABLET | ORAL | Status: DC

## 2012-07-29 MED ORDER — AZELASTINE HCL 0.1 % NA SOLN: NASAL | Status: DC

## 2012-07-29 NOTE — Progress Notes (Signed)
  Subjective:    Patient ID: George Watson, male    DOB: 08-03-1945, 67 y.o.   MRN: [id number]  HPI patient enters with onset today of headache nasal congestion and burning scratchy throat and a dry cough    Review of Systems     Objective:   Physical Exam HEENT exam reveals sinus congestion. His posterior pharynx is normal. Chest exam reveals some dry rales which are notable on exam with occasional prolonged expiratory phase  Results for orders placed in visit on 07/29/12  POCT INFLUENZA A/B      Component Value Range   Influenza A, POC Negative     Influenza B, POC Negative          Assessment & Plan:  Ahead and cover with a Z-Pak for recurrent sinus bronchitis. He was given Astelin spray to use for his nasal congestion

## 2012-08-16 ENCOUNTER — Other Ambulatory Visit (INDEPENDENT_AMBULATORY_CARE_PROVIDER_SITE_OTHER): Payer: PRIVATE HEALTH INSURANCE | Admitting: Emergency Medicine

## 2012-08-16 DIAGNOSIS — E119 Type 2 diabetes mellitus without complications: Secondary | ICD-10-CM

## 2012-08-16 DIAGNOSIS — K4090 Unilateral inguinal hernia, without obstruction or gangrene, not specified as recurrent: Secondary | ICD-10-CM

## 2012-08-16 DIAGNOSIS — B1920 Unspecified viral hepatitis C without hepatic coma: Secondary | ICD-10-CM

## 2012-08-16 DIAGNOSIS — E7800 Pure hypercholesterolemia, unspecified: Secondary | ICD-10-CM

## 2012-08-16 DIAGNOSIS — B182 Chronic viral hepatitis C: Secondary | ICD-10-CM

## 2012-08-16 DIAGNOSIS — Z79899 Other long term (current) drug therapy: Secondary | ICD-10-CM

## 2012-08-16 DIAGNOSIS — I10 Essential (primary) hypertension: Secondary | ICD-10-CM

## 2012-08-16 DIAGNOSIS — E785 Hyperlipidemia, unspecified: Secondary | ICD-10-CM

## 2012-08-16 LAB — CBC WITH DIFFERENTIAL/PLATELET
HCT: 45.9 % (ref 39.0–52.0)
Hemoglobin: 16.4 g/dL (ref 13.0–17.0)
Lymphocytes Relative: 43 % (ref 12–46)
Lymphs Abs: 2.9 10*3/uL (ref 0.7–4.0)
Monocytes Absolute: 0.8 10*3/uL (ref 0.1–1.0)
Monocytes Relative: 12 % (ref 3–12)
Neutro Abs: 2.9 10*3/uL (ref 1.7–7.7)
Neutrophils Relative %: 41 % — ABNORMAL LOW (ref 43–77)
RBC: 5.16 MIL/uL (ref 4.22–5.81)

## 2012-08-16 LAB — LIPID PANEL
Cholesterol: 173 mg/dL (ref 0–200)
LDL Cholesterol: 111 mg/dL — ABNORMAL HIGH (ref 0–99)
Triglycerides: 148 mg/dL (ref <150)

## 2012-08-16 LAB — COMPREHENSIVE METABOLIC PANEL
Albumin: 4.4 g/dL (ref 3.5–5.2)
Alkaline Phosphatase: 62 U/L (ref 39–117)
CO2: 19 mEq/L (ref 19–32)
Calcium: 9.5 mg/dL (ref 8.4–10.5)
Chloride: 108 mEq/L (ref 96–112)
Glucose, Bld: 123 mg/dL — ABNORMAL HIGH (ref 70–99)
Potassium: 4 mEq/L (ref 3.5–5.3)
Sodium: 138 mEq/L (ref 135–145)
Total Protein: 7.1 g/dL (ref 6.0–8.3)

## 2012-08-24 ENCOUNTER — Encounter: Payer: Self-pay | Admitting: Emergency Medicine

## 2012-08-24 ENCOUNTER — Ambulatory Visit (INDEPENDENT_AMBULATORY_CARE_PROVIDER_SITE_OTHER): Payer: PRIVATE HEALTH INSURANCE | Admitting: Emergency Medicine

## 2012-08-24 ENCOUNTER — Ambulatory Visit: Payer: PRIVATE HEALTH INSURANCE

## 2012-08-24 VITALS — BP 125/74 | HR 61 | Temp 97.5°F | Resp 18 | Wt 176.0 lb

## 2012-08-24 DIAGNOSIS — Z0000 Encounter for general adult medical examination without abnormal findings: Secondary | ICD-10-CM

## 2012-08-24 DIAGNOSIS — J449 Chronic obstructive pulmonary disease, unspecified: Secondary | ICD-10-CM

## 2012-08-24 DIAGNOSIS — E786 Lipoprotein deficiency: Secondary | ICD-10-CM

## 2012-08-24 DIAGNOSIS — E119 Type 2 diabetes mellitus without complications: Secondary | ICD-10-CM

## 2012-08-24 NOTE — Progress Notes (Signed)
Subjective:    Patient ID: George Watson, male    DOB: 07-07-1946, 67 y.o.   MRN: [id number]  HPI    Review of Systems  Constitutional: Negative.   HENT: Negative.   Eyes: Negative.   Respiratory: Positive for wheezing.   Cardiovascular: Negative.   Gastrointestinal: Negative.   Genitourinary: Positive for testicular pain.  Musculoskeletal: Positive for back pain.  Skin: Negative.   Neurological: Negative.   Hematological: Negative.   Psychiatric/Behavioral: Negative.        Objective:   Physical Exam HEENT exam is unremarkable. His neck is supple. Chest is clear to auscultation and percussion with decreased breath sounds in the bases heart regular rate no murmurs rubs or gallops. The abdomen is soft liver and spleen are not enlarged there are no areas of tenderness. Genitalia exam reveals pulses symmetrical no lower extremity swelling noted  PFT consistent with restrictive lung disease FVC, FEV1 at about 60%  [facility] reading (PRIMARY) by  Dr Cleta Alberts there are chronic changes in the bases no acute changes seen  Results for orders placed in visit on 08/16/12  CBC WITH DIFFERENTIAL      Component Value Range   WBC 6.9  4.0 - 10.5 K/uL   RBC 5.16  4.22 - 5.81 MIL/uL   Hemoglobin 16.4  13.0 - 17.0 g/dL   HCT 40.9  81.1 - 91.4 %   MCV 89.0  78.0 - 100.0 fL   MCH 31.8  26.0 - 34.0 pg   MCHC 35.7  30.0 - 36.0 g/dL   RDW 78.2  95.6 - 21.3 %   Platelets 217  150 - 400 K/uL   Neutrophils Relative 41 (*) 43 - 77 %   Neutro Abs 2.9  1.7 - 7.7 K/uL   Lymphocytes Relative 43  12 - 46 %   Lymphs Abs 2.9  0.7 - 4.0 K/uL   Monocytes Relative 12  3 - 12 %   Monocytes Absolute 0.8  0.1 - 1.0 K/uL   Eosinophils Relative 4  0 - 5 %   Eosinophils Absolute 0.2  0.0 - 0.7 K/uL   Basophils Relative 0  0 - 1 %   Basophils Absolute 0.0  0.0 - 0.1 K/uL   Smear Review Criteria for review not met    COMPREHENSIVE METABOLIC PANEL      Component Value Range   Sodium 138  135 - 145 mEq/L   Potassium 4.0  3.5 - 5.3 mEq/L   Chloride 108  96 - 112 mEq/L   CO2 19  19 - 32 mEq/L   Glucose, Bld 123 (*) 70 - 99 mg/dL   BUN 25 (*) 6 - 23 mg/dL   Creat 0.86  5.78 - 4.69 mg/dL   Total Bilirubin 0.8  0.3 - 1.2 mg/dL   Alkaline Phosphatase 62  39 - 117 U/L   AST 50 (*) 0 - 37 U/L   ALT 74 (*) 0 - 53 U/L   Total Protein 7.1  6.0 - 8.3 g/dL   Albumin 4.4  3.5 - 5.2 g/dL   Calcium 9.5  8.4 - 62.9 mg/dL  LIPID PANEL      Component Value Range   Cholesterol 173  0 - 200 mg/dL   Triglycerides 528  <413 mg/dL   HDL 32 (*) >24 mg/dL   Total CHOL/HDL Ratio 5.4     VLDL 30  0 - 40 mg/dL   LDL Cholesterol 401 (*) 0 - 99 mg/dL  GLUCOSE, POCT (  MANUAL RESULT ENTRY)      Component Value Range   POC Glucose 116 (*) 70 - 99 mg/dl  POCT GLYCOSYLATED HEMOGLOBIN (HGB A1C)      Component Value Range   Hemoglobin A1C 6.0        Assessment & Plan:  Diabetes cholesterol but overall stable at present. He is up-to-date on his shots. I told him to check with Dr. [name] to see if he is okay to take a shingles vaccine. He is interested in starting new hep C treatment in the near future. I offered him the option of seeing a pulmonary specialist regarding his COPD and restrictive lung disease and he was to hold off at present.

## 2012-08-30 ENCOUNTER — Other Ambulatory Visit: Payer: Self-pay | Admitting: Emergency Medicine

## 2012-09-07 ENCOUNTER — Ambulatory Visit (INDEPENDENT_AMBULATORY_CARE_PROVIDER_SITE_OTHER): Payer: PRIVATE HEALTH INSURANCE

## 2012-09-10 ENCOUNTER — Ambulatory Visit (INDEPENDENT_AMBULATORY_CARE_PROVIDER_SITE_OTHER): Payer: PRIVATE HEALTH INSURANCE | Admitting: Family Medicine

## 2012-09-10 VITALS — BP 133/78 | HR 88 | Temp 98.1°F | Resp 16 | Ht 71.0 in | Wt 173.0 lb

## 2012-09-10 DIAGNOSIS — J441 Chronic obstructive pulmonary disease with (acute) exacerbation: Secondary | ICD-10-CM

## 2012-09-10 DIAGNOSIS — J310 Chronic rhinitis: Secondary | ICD-10-CM

## 2012-09-10 MED ORDER — PREDNISONE 20 MG PO TABS: ORAL_TABLET | ORAL | Status: DC

## 2012-09-10 MED ORDER — LEVOFLOXACIN 500 MG PO TABS: 500.0000 mg | ORAL_TABLET | Freq: Every day | ORAL | Status: DC

## 2012-09-10 NOTE — Patient Instructions (Addendum)
Drink plenty of fluids  Get plenty of rest  Take medications as ordered

## 2012-09-10 NOTE — Progress Notes (Signed)
Subjective: Patient had a respiratory tract infection in January the Dr. treated with a Z-Pak with. He has been doing well, but the couple of weeks he'll resume having a cough. Ask persisted. He says it due to his COPD he has chronic wheezing. He does not smoke. He does do some volunteer work. He is going on vacation about a week and would like to be well for that. Who's been done some purulent mucus out of his nose. Also has some blood in that. He coughed up green mucus this morning from his lungs. He feels sick or in the morning and better as the day goes on.  Objective: TMs are normal. Nose congested. Throat was clear. Neck supple without nodes or thyromegaly. Chest has some soft wheezes the left lower lobe. Heart regular without any murmurs.  Assessment: COPD with exacerbation Purulent rhinitis  Plan: Levaquin Prednisone Return if worse

## 2012-09-24 ENCOUNTER — Other Ambulatory Visit: Payer: PRIVATE HEALTH INSURANCE

## 2012-10-01 ENCOUNTER — Ambulatory Visit
Admission: RE | Admit: 2012-10-01 | Discharge: 2012-10-01 | Disposition: A | Discharge status: Home / Self Care | Payer: PRIVATE HEALTH INSURANCE | Source: Ambulatory Visit | Attending: Gastroenterology | Admitting: Gastroenterology

## 2012-11-24 ENCOUNTER — Other Ambulatory Visit: Payer: Self-pay | Admitting: *Deleted

## 2012-11-24 DIAGNOSIS — E119 Type 2 diabetes mellitus without complications: Secondary | ICD-10-CM

## 2012-11-24 DIAGNOSIS — E786 Lipoprotein deficiency: Secondary | ICD-10-CM

## 2012-11-24 DIAGNOSIS — J449 Chronic obstructive pulmonary disease, unspecified: Secondary | ICD-10-CM

## 2012-11-25 ENCOUNTER — Other Ambulatory Visit (INDEPENDENT_AMBULATORY_CARE_PROVIDER_SITE_OTHER): Payer: PRIVATE HEALTH INSURANCE | Admitting: *Deleted

## 2012-11-25 DIAGNOSIS — J4489 Other specified chronic obstructive pulmonary disease: Secondary | ICD-10-CM

## 2012-11-25 DIAGNOSIS — E786 Lipoprotein deficiency: Secondary | ICD-10-CM

## 2012-11-25 DIAGNOSIS — E119 Type 2 diabetes mellitus without complications: Secondary | ICD-10-CM

## 2012-11-25 DIAGNOSIS — J449 Chronic obstructive pulmonary disease, unspecified: Secondary | ICD-10-CM

## 2012-11-25 LAB — LIPID PANEL
Cholesterol: 176 mg/dL (ref 0–200)
HDL: 31 mg/dL — ABNORMAL LOW (ref >39)
LDL Cholesterol: 115 mg/dL — ABNORMAL HIGH (ref 0–99)
Triglycerides: 149 mg/dL (ref <150)
VLDL: 30 mg/dL (ref 0–40)

## 2012-11-25 LAB — COMPREHENSIVE METABOLIC PANEL
ALT: 74 U/L — ABNORMAL HIGH (ref 0–53)
AST: 51 U/L — ABNORMAL HIGH (ref 0–37)
BUN: 21 mg/dL (ref 6–23)
CO2: 24 mEq/L (ref 19–32)
Calcium: 9.3 mg/dL (ref 8.4–10.5)
Chloride: 107 mEq/L (ref 96–112)
Creat: 0.9 mg/dL (ref 0.50–1.35)
Total Bilirubin: 0.6 mg/dL (ref 0.3–1.2)

## 2012-11-25 NOTE — Progress Notes (Signed)
Patient here for labs only. 

## 2012-12-05 ENCOUNTER — Other Ambulatory Visit: Payer: Self-pay | Admitting: Physician Assistant

## 2012-12-07 ENCOUNTER — Ambulatory Visit (INDEPENDENT_AMBULATORY_CARE_PROVIDER_SITE_OTHER): Payer: PRIVATE HEALTH INSURANCE | Admitting: Emergency Medicine

## 2012-12-07 ENCOUNTER — Encounter: Payer: Self-pay | Admitting: Emergency Medicine

## 2012-12-07 VITALS — BP 111/72 | HR 72 | Temp 97.8°F | Resp 16 | Ht 71.0 in | Wt 160.2 lb

## 2012-12-07 DIAGNOSIS — W57XXXA Bitten or stung by nonvenomous insect and other nonvenomous arthropods, initial encounter: Secondary | ICD-10-CM

## 2012-12-07 DIAGNOSIS — E785 Hyperlipidemia, unspecified: Secondary | ICD-10-CM

## 2012-12-07 DIAGNOSIS — I10 Essential (primary) hypertension: Secondary | ICD-10-CM

## 2012-12-07 DIAGNOSIS — J441 Chronic obstructive pulmonary disease with (acute) exacerbation: Secondary | ICD-10-CM

## 2012-12-07 NOTE — Progress Notes (Signed)
  Subjective:    Patient ID: George Watson, male    DOB: Dec 10, 1945, 67 y.o.   MRN: [id number]  HPI 67 YO Male patient comes in today for a follow up with Dr Cleta Alberts.   Patient has complaints of lower energy. He thinks this may be due to his inhaler being empty. Wants to know if taking Vitamin B supplements would help with this.   He has been exercising regularly. He sometimes finds it difficult to find the drive to get going. He uses the exercise bike and paddles regularly- kayaking. Denies any signs of depression.  He has been having difficulty taking a deep breath and became winded easily. He figured out that his Qvar inhaler was empty. He had his inhaler refilled Sunday and he has found it is getting better. He will start using it BID.  Patient denies any chest pains, no dizziness. No urinary problems.   He has not had any herpes flare up in years.   Patient does complain of generalized body aches. He states the pains are more in the morning than in the evenings.   Patient needs refills of his Qvar, Skelaxin, ProAir sent over to his pharmacy.   Patient has requested a script for Viagra.   Review of Systems    Peak Flow: 330  Objective:   Physical Exam HEENT exam is unremarkable. Neck is supple. Chest is clear. Heart regular rate no murmurs. Abdomen soft nontender.       Assessment & Plan:  Increase Qvar inhalations to twice a day. Be sure to rinse mouth following. Use the albuterol before exercise. Suggest 1000 mg Vitamin D . He is going to [facility]  to discuss with them the treatment options for hepatitis C

## 2012-12-08 ENCOUNTER — Other Ambulatory Visit: Payer: Self-pay | Admitting: Emergency Medicine

## 2012-12-08 LAB — B. BURGDORFI ANTIBODIES: B burgdorferi Ab IgG+IgM: 0.75 {ISR}

## 2012-12-08 LAB — ROCKY MTN SPOTTED FVR AB, IGM-BLOOD: ROCKY MTN SPOTTED FEVER, IGM: 0.47 IV

## 2012-12-09 ENCOUNTER — Other Ambulatory Visit: Payer: Self-pay | Admitting: Emergency Medicine

## 2012-12-09 ENCOUNTER — Other Ambulatory Visit: Payer: Self-pay

## 2012-12-09 MED ORDER — ALBUTEROL SULFATE HFA 108 (90 BASE) MCG/ACT IN AERS: 2.0000 | INHALATION_SPRAY | Freq: Four times a day (QID) | RESPIRATORY_TRACT | Status: DC | PRN

## 2013-02-23 ENCOUNTER — Other Ambulatory Visit: Payer: Self-pay

## 2013-03-02 ENCOUNTER — Other Ambulatory Visit: Payer: Self-pay | Admitting: Physician Assistant

## 2013-03-08 ENCOUNTER — Telehealth: Payer: Self-pay

## 2013-03-08 DIAGNOSIS — E119 Type 2 diabetes mellitus without complications: Secondary | ICD-10-CM

## 2013-03-08 NOTE — Telephone Encounter (Signed)
Pt has an appointment in September to see Dr. Cleta Alberts.  He says [facility] usually always wants to do blood work a week or so ahead of time.  Does he want this done for this visit?  If so when does he need to come in?  Please call  [phone]

## 2013-03-09 NOTE — Telephone Encounter (Signed)
I will put lab orders in for pt - Dr. Cleta Alberts is out of town, so I am not sure exactly what he would like ordered - but will do basic labs.  Dr. Cleta Alberts may need to do additional labs if there is something else that he wants to check  Pt came come to 102 for lab only visit whenever convenient.  Would come first thing in the AM if possible so he will be fasting

## 2013-03-09 NOTE — Telephone Encounter (Signed)
Lm on pt cell for him to come in tomorrow am for his fasting blood work to be drawn.

## 2013-03-10 ENCOUNTER — Other Ambulatory Visit (INDEPENDENT_AMBULATORY_CARE_PROVIDER_SITE_OTHER): Payer: PRIVATE HEALTH INSURANCE

## 2013-03-10 DIAGNOSIS — E119 Type 2 diabetes mellitus without complications: Secondary | ICD-10-CM

## 2013-03-10 LAB — CBC
HCT: 44.8 % (ref 39.0–52.0)
Hemoglobin: 15.9 g/dL (ref 13.0–17.0)
RBC: 4.99 MIL/uL (ref 4.22–5.81)

## 2013-03-10 LAB — COMPREHENSIVE METABOLIC PANEL
Albumin: 4.5 g/dL (ref 3.5–5.2)
BUN: 19 mg/dL (ref 6–23)
CO2: 24 mEq/L (ref 19–32)
Calcium: 10.1 mg/dL (ref 8.4–10.5)
Glucose, Bld: 116 mg/dL — ABNORMAL HIGH (ref 70–99)
Potassium: 4.3 mEq/L (ref 3.5–5.3)
Sodium: 136 mEq/L (ref 135–145)
Total Protein: 7.7 g/dL (ref 6.0–8.3)

## 2013-03-10 LAB — LIPID PANEL
Cholesterol: 237 mg/dL — ABNORMAL HIGH (ref 0–200)
HDL: 32 mg/dL — ABNORMAL LOW (ref >39)
Total CHOL/HDL Ratio: 7.4 Ratio

## 2013-03-10 LAB — POCT GLYCOSYLATED HEMOGLOBIN (HGB A1C): Hemoglobin A1C: 5.6

## 2013-03-10 NOTE — Progress Notes (Signed)
Patient here for labs only. 

## 2013-03-15 ENCOUNTER — Encounter: Payer: Self-pay | Admitting: Emergency Medicine

## 2013-03-22 ENCOUNTER — Ambulatory Visit (INDEPENDENT_AMBULATORY_CARE_PROVIDER_SITE_OTHER): Payer: PRIVATE HEALTH INSURANCE | Admitting: Emergency Medicine

## 2013-03-22 ENCOUNTER — Encounter: Payer: Self-pay | Admitting: Emergency Medicine

## 2013-03-22 VITALS — BP 126/74 | HR 64 | Temp 98.2°F | Resp 16 | Ht 71.0 in | Wt 168.2 lb

## 2013-03-22 DIAGNOSIS — E7800 Pure hypercholesterolemia, unspecified: Secondary | ICD-10-CM

## 2013-03-22 DIAGNOSIS — B1920 Unspecified viral hepatitis C without hepatic coma: Secondary | ICD-10-CM

## 2013-03-22 DIAGNOSIS — G4700 Insomnia, unspecified: Secondary | ICD-10-CM

## 2013-03-22 DIAGNOSIS — E119 Type 2 diabetes mellitus without complications: Secondary | ICD-10-CM

## 2013-03-22 MED ORDER — METAXALONE 800 MG PO TABS: 800.0000 mg | ORAL_TABLET | Freq: Every evening | ORAL | Status: DC | PRN

## 2013-03-22 NOTE — Progress Notes (Signed)
  Subjective:    Patient ID: George Watson, male    DOB: [birth year], 67 y.o.   MRN: [id number]  HPI patient in for followup of his hypertension diabetes and hyperlipidemia. He is currently undergoing oral medications for treatment of his hep C. Of note his cholesterol and triglycerides were up last time after receiving his hep C treatment. It is unclear whether this was related to his therapy or not. He is having difficulty with sleeping and is asking for Skelaxin at night    Review of Systems     Objective:   Physical Exam HEENT exam is unremarkable. Neck supple chest clear heart regular rate no murmurs        Assessment & Plan:  Skelaxin 800 mg one at bedtime. Continue hep C treatment repeat lipid panel once he is off of hep C treatment

## 2013-04-25 ENCOUNTER — Ambulatory Visit: Payer: PRIVATE HEALTH INSURANCE

## 2013-04-25 ENCOUNTER — Ambulatory Visit (INDEPENDENT_AMBULATORY_CARE_PROVIDER_SITE_OTHER): Payer: PRIVATE HEALTH INSURANCE | Admitting: Emergency Medicine

## 2013-04-25 VITALS — BP 130/82 | HR 65 | Temp 97.7°F | Resp 16 | Ht 71.0 in | Wt 170.0 lb

## 2013-04-25 DIAGNOSIS — R1032 Left lower quadrant pain: Secondary | ICD-10-CM

## 2013-04-25 DIAGNOSIS — B182 Chronic viral hepatitis C: Secondary | ICD-10-CM

## 2013-04-25 LAB — COMPREHENSIVE METABOLIC PANEL
ALT: 14 U/L (ref 0–53)
AST: 16 U/L (ref 0–37)
Alkaline Phosphatase: 67 U/L (ref 39–117)
Potassium: 4.5 mEq/L (ref 3.5–5.3)
Sodium: 136 mEq/L (ref 135–145)
Total Bilirubin: 0.9 mg/dL (ref 0.3–1.2)
Total Protein: 8.1 g/dL (ref 6.0–8.3)

## 2013-04-25 LAB — POCT URINALYSIS DIPSTICK
Glucose, UA: NEGATIVE
Leukocytes, UA: NEGATIVE
Nitrite, UA: NEGATIVE
Protein, UA: NEGATIVE
Urobilinogen, UA: 0.2

## 2013-04-25 LAB — POCT CBC
Granulocyte percent: 64 %G (ref 37–80)
MCH, POC: 32.3 pg — AB (ref 27–31.2)
MCV: 98.5 fL — AB (ref 80–97)
MID (cbc): 0.8 (ref 0–0.9)
MPV: 8.7 fL (ref 0–99.8)
POC LYMPH PERCENT: 28 %L (ref 10–50)
POC MID %: 8 %M (ref 0–12)
Platelet Count, POC: 256 10*3/uL (ref 142–424)
RBC: 4.95 M/uL (ref 4.69–6.13)
RDW, POC: 13.2 %

## 2013-04-25 LAB — POCT UA - MICROSCOPIC ONLY
Casts, Ur, LPF, POC: NEGATIVE
Crystals, Ur, HPF, POC: NEGATIVE

## 2013-04-25 MED ORDER — METRONIDAZOLE 500 MG PO TABS: 500.0000 mg | ORAL_TABLET | Freq: Two times a day (BID) | ORAL | Status: DC

## 2013-04-25 MED ORDER — CIPROFLOXACIN HCL 500 MG PO TABS: 500.0000 mg | ORAL_TABLET | Freq: Two times a day (BID) | ORAL | Status: DC

## 2013-04-25 NOTE — Patient Instructions (Signed)
Be sure you go to the emergency room if you have acute worsening in your abdominal pain. Otherwise I would like to see you on Thursday morning in the office

## 2013-04-25 NOTE — Progress Notes (Addendum)
Subjective:    Patient ID: George George, male    DOB: 1946/07/21, 67 y.o.   MRN: [id number]  Abdominal Cramping This is a new problem. Episode onset: 2 days ago. The onset quality is gradual. The problem occurs 2 to 4 times per day. Duration: 2 minutes. The problem has been unchanged. The pain is mild. The quality of the pain is cramping. The abdominal pain does not radiate. Pertinent negatives include no fever, nausea or vomiting. The pain is relieved by nothing.   HPI Comments: George George is a 67 y.o. male who presents to [facility] for complaints of abdominal pain which he describes as a cramping sensation lasting for a few minutes at a time and occurs every few hours since Friday 2 days ago. He worries maybe has flare up of diverticulosis. He states chills but denies fever. He states recently has had normal BMs, and without blood or mucous. He states his energy level is doing well. He states minimal back pain, agrees for getting a flew shot today.  He also states currenctly has no detectable virus of Hepatitis C and is being followed by Dr. Corky Sing at [facility].   Review of Systems  Constitutional: Positive for chills. Negative for fever.  Respiratory: Negative for shortness of breath.   Gastrointestinal: Negative for nausea and vomiting.  Neurological: Negative for weakness.  All other systems reviewed and are negative.       Objective:   Physical Exam  Nursing note and vitals reviewed. Constitutional: He is oriented to person, place, and time. He appears well-developed and well-nourished. No distress.  HENT:  Head: Normocephalic and atraumatic.  Eyes: EOM are normal.  Neck: Neck supple.  Cardiovascular: Normal rate, regular rhythm and normal heart sounds.  Exam reveals no gallop.   No murmur heard. Pulmonary/Chest: Effort normal. No respiratory distress. He has rales (dry rales left base).  Abdominal: Bowel sounds are normal. There is tenderness (significant LLQ tenderness).   Musculoskeletal: Normal range of motion.  Neurological: He is alert and oriented to person, place, and time.  Skin: Skin is warm and dry.  Psychiatric: He has a normal mood and affect. His behavior is normal.  [facility] reading (PRIMARY) by  Dr. Cleta Alberts on one view there is questionable soft tissue density in the left midabdomen. No acute changes seen patient does appear to have hepatosplenomegally on her  plain films. There is no free air seen .Marland Kitchen  Results for orders placed in visit on 04/25/13  POCT CBC      Result Value Range   WBC 9.4  4.6 - 10.2 K/uL   Lymph, poc 2.6  0.6 - 3.4   POC LYMPH PERCENT 28.0  10 - 50 %L   MID (cbc) 0.8  0 - 0.9   POC MID % 8.0  0 - 12 %M   POC Granulocyte 6.0  2 - 6.9   Granulocyte percent 64.0  37 - 80 %G   RBC 4.95  4.69 - 6.13 M/uL   Hemoglobin 16.0  14.1 - 18.1 g/dL   HCT, POC 78.2  95.6 - 53.7 %   MCV 98.5 (*) 80 - 97 fL   MCH, POC 32.3 (*) 27 - 31.2 pg   MCHC 32.8  31.8 - 35.4 g/dL   RDW, POC 21.3     Platelet Count, POC 256  142 - 424 K/uL   MPV 8.7  0 - 99.8 fL  POCT UA - MICROSCOPIC ONLY      Result  Value Range   WBC, Ur, HPF, POC neg     RBC, urine, microscopic neg     Bacteria, U Microscopic neg     Mucus, UA neg     Epithelial cells, urine per micros 0-3     Crystals, Ur, HPF, POC neg     Casts, Ur, LPF, POC neg     Yeast, UA neg    POCT URINALYSIS DIPSTICK      Result Value Range   Color, UA yellow     Clarity, UA clear     Glucose, UA neg     Bilirubin, UA neg     Ketones, UA neg     Spec Grav, UA 1.020     Blood, UA neg     pH, UA 7.0     Protein, UA neg     Urobilinogen, UA 0.2     Nitrite, UA neg     Leukocytes, UA Negative             Assessment & Plan:  1. Blood  work, UA, abdominal X-ray. I'm concerned about early diverticulitis. He does not look acutely ill. We'll treat with Flagyl Cipro. He is given precautions. Recheck on Thursday .

## 2013-04-26 LAB — URINE CULTURE: Organism ID, Bacteria: NO GROWTH

## 2013-04-28 ENCOUNTER — Ambulatory Visit (INDEPENDENT_AMBULATORY_CARE_PROVIDER_SITE_OTHER): Payer: PRIVATE HEALTH INSURANCE | Admitting: Emergency Medicine

## 2013-04-28 VITALS — BP 120/70 | HR 60 | Temp 97.2°F | Resp 16 | Ht 71.25 in | Wt 165.6 lb

## 2013-04-28 DIAGNOSIS — Z23 Encounter for immunization: Secondary | ICD-10-CM

## 2013-04-28 DIAGNOSIS — R109 Unspecified abdominal pain: Secondary | ICD-10-CM

## 2013-04-28 DIAGNOSIS — K5732 Diverticulitis of large intestine without perforation or abscess without bleeding: Secondary | ICD-10-CM

## 2013-04-28 NOTE — Progress Notes (Signed)
[address]   [place], [place]  [address]   [phone] Subjective:    Patient ID: George Watson, male    DOB: Nov 01, 1945, 67 y.o.   MRN: [id number]  George  George Comments: George Watson is a 67 y.o. male who presents to the Emergency Department for a recheck for his abdominal pain due to diverticulitis. He states that he is feeling much better and has no pain currently. He is taking his medications prescribed at the last visit and does not have any complications. He is requesting a flu shot. His wife is doing well and has been recently dieting.   Past Medical History  Diagnosis Date  . Hepatitis C 10/1996  . H/O drug abuse     multisubstance  . Other and unspecified hyperlipidemia   . HSV-2 (herpes simplex virus 2) infection   . Elevated PSA   . COPD (chronic obstructive pulmonary disease)   . BPH (benign prostatic hypertrophy)   . Inguinal hernia     right  . Hx of biopsy 08/2004    liver  . DDD (degenerative disc disease), lumbosacral   . Essential hypertension, benign     diet controlled  . GERD (gastroesophageal reflux disease)   . Prostate cancer   . Fracture of shaft of clavicle   . Substance abuse   . Tuberculosis    Past Surgical History  Procedure Laterality Date  . Fracture ribs      3  . Cervical spine surgery    . Right shoulder arthroscopy  2011  . Right rotator cuff  1992  . Left knee meniscus repair  1992  . Inguinal hernia repair  07/01/2012    Procedure: HERNIA REPAIR INGUINAL ADULT;  Surgeon: Lodema Pilot, DO;  Location: [facility]S;  Service: General;  Laterality: Right;  with Mesh  . Hernia repair     Family History  Problem Relation Age of Onset  . Cancer Sister     breast  . Leukemia Mother   . Cancer Father    History   Social History  . Marital Status: Married    Spouse Name: N/A    Number of Children: N/A  . Years of Education: N/A   Occupational History  . Not on file.   Social History Main Topics  . Smoking status: Former Smoker  -- 43 years    Types: Cigarettes    Quit date: 07/21/2002  . Smokeless tobacco: Never Used  . Alcohol Use: No  . Drug Use: No     Comment: 26 years ago cocaine, heroin. methadone  . Sexual Activity: Not on file     Comment: number of sex partners in the last 12 months  1   Other Topics Concern  . Not on file   Social History Narrative   Married to [employer]. Exercise cardio daily for 30 minutes   No Known Allergies Current Outpatient Prescriptions on File Prior to Visit  Medication Sig Dispense Refill  . albuterol (PROVENTIL HFA;VENTOLIN HFA) 108 (90 BASE) MCG/ACT inhaler Inhale 2 puffs into the lungs every 6 (six) hours as needed. For shortness of breath  1 Inhaler  5  . aspirin 81 MG tablet Take 81 mg by mouth daily.      . ciprofloxacin (CIPRO) 500 MG tablet Take 1 tablet (500 mg total) by mouth 2 (two) times daily.  20 tablet  0  . dutasteride (AVODART) 0.5 MG capsule Take 0.5 mg by mouth daily with breakfast.       .  famotidine (PEPCID) 20 MG tablet Take 40 mg by mouth 2 (two) times daily.       . Glucosamine-Chondroit-Vit C-Mn (GLUCOSAMINE 1500 COMPLEX PO) Take 2 tablets by mouth daily with breakfast.       . levofloxacin (LEVAQUIN) 500 MG tablet Take 1 tablet (500 mg total) by mouth daily.  7 tablet  0  . metroNIDAZOLE (FLAGYL) 500 MG tablet Take 1 tablet (500 mg total) by mouth 2 (two) times daily before a meal.  20 tablet  0  . Multiple Vitamin (MULTIVITAMIN) tablet Take 1 tablet by mouth daily.      . Omega-3 Fatty Acids (FISH OIL) 1000 MG CAPS Take 1 capsule by mouth 3 (three) times daily.       Marland Kitchen oxybutynin (DITROPAN) 5 MG tablet Take 5 mg by mouth at bedtime.      Marland Kitchen PRESCRIPTION MEDICATION Olysio 150 mg taking daily.      Marland Kitchen PRESCRIPTION MEDICATION Sovaldi taking once daily      . QVAR 80 MCG/ACT inhaler INHALE TWO PUFFS TWICE DAILY  8.7 g  1  . Tamsulosin HCl (FLOMAX) 0.4 MG CAPS Take 0.4 mg by mouth daily after breakfast.      . azelastine (ASTELIN) 137 MCG/SPRAY nasal  spray Use in each nostril as directed. 2 sprays in each nostril twice a day.  30 mL  12  . metaxalone (SKELAXIN) 800 MG tablet Take 800 mg by mouth 3 (three) times daily as needed. For back pain      . oxyCODONE-acetaminophen (PERCOCET) 7.5-325 MG per tablet Take 1 tablet by mouth every 4 (four) hours as needed for pain.  40 tablet  0  . predniSONE (DELTASONE) 20 MG tablet Take 3 daily for 2 days, then 2 daily for 2 days, then one daily for 2 days  12 tablet  0  . VIAGRA 100 MG tablet TAKE 1/2-1 TABLET(S) ONE HOUR PRIOR TO INTERCOURSE  6 tablet  3   No current facility-administered medications on file prior to visit.      Review of Systems  Gastrointestinal: Negative for abdominal pain.       Objective:   Physical Exam  Nursing note and vitals reviewed. Cardiovascular: Normal rate and regular rhythm.   No murmur heard. Abdominal: Soft. Bowel sounds are normal. There is no tenderness.          Assessment & Plan:   Problem List Items Addressed This Visit   None       I personally performed the services described in this documentation, which was scribed in my presence. The recorded information has been reviewed and is accurate. Patient significantly better. We'll finish out his Cipro recheck if symptoms persist.

## 2013-07-19 ENCOUNTER — Other Ambulatory Visit: Payer: Self-pay | Admitting: *Deleted

## 2013-07-19 DIAGNOSIS — E785 Hyperlipidemia, unspecified: Secondary | ICD-10-CM

## 2013-07-19 DIAGNOSIS — Z79899 Other long term (current) drug therapy: Secondary | ICD-10-CM

## 2013-07-19 DIAGNOSIS — B1920 Unspecified viral hepatitis C without hepatic coma: Secondary | ICD-10-CM

## 2013-07-19 DIAGNOSIS — E119 Type 2 diabetes mellitus without complications: Secondary | ICD-10-CM

## 2013-07-25 ENCOUNTER — Other Ambulatory Visit: Payer: Self-pay | Admitting: Emergency Medicine

## 2013-07-25 ENCOUNTER — Other Ambulatory Visit (INDEPENDENT_AMBULATORY_CARE_PROVIDER_SITE_OTHER): Payer: PRIVATE HEALTH INSURANCE | Admitting: Family Medicine

## 2013-07-25 DIAGNOSIS — E119 Type 2 diabetes mellitus without complications: Secondary | ICD-10-CM

## 2013-07-25 DIAGNOSIS — B1920 Unspecified viral hepatitis C without hepatic coma: Secondary | ICD-10-CM

## 2013-07-25 DIAGNOSIS — E785 Hyperlipidemia, unspecified: Secondary | ICD-10-CM

## 2013-07-25 DIAGNOSIS — Z79899 Other long term (current) drug therapy: Secondary | ICD-10-CM

## 2013-07-25 LAB — COMPLETE METABOLIC PANEL WITH GFR
ALT: 21 U/L (ref 0–53)
AST: 20 U/L (ref 0–37)
Albumin: 4.6 g/dL (ref 3.5–5.2)
Alkaline Phosphatase: 52 U/L (ref 39–117)
BUN: 18 mg/dL (ref 6–23)
CALCIUM: 10 mg/dL (ref 8.4–10.5)
CHLORIDE: 103 meq/L (ref 96–112)
CO2: 24 mEq/L (ref 19–32)
CREATININE: 0.81 mg/dL (ref 0.50–1.35)
GLUCOSE: 109 mg/dL — AB (ref 70–99)
Potassium: 4.2 mEq/L (ref 3.5–5.3)
Sodium: 138 mEq/L (ref 135–145)
Total Bilirubin: 0.7 mg/dL (ref 0.3–1.2)
Total Protein: 7.5 g/dL (ref 6.0–8.3)

## 2013-07-25 LAB — CBC WITH DIFFERENTIAL/PLATELET
BASOS ABS: 0.1 10*3/uL (ref 0.0–0.1)
Basophils Relative: 1 % (ref 0–1)
EOS PCT: 4 % (ref 0–5)
Eosinophils Absolute: 0.3 10*3/uL (ref 0.0–0.7)
HCT: 46.4 % (ref 39.0–52.0)
Hemoglobin: 16.5 g/dL (ref 13.0–17.0)
LYMPHS PCT: 32 % (ref 12–46)
Lymphs Abs: 2.5 10*3/uL (ref 0.7–4.0)
MCH: 31.1 pg (ref 26.0–34.0)
MCHC: 35.6 g/dL (ref 30.0–36.0)
MCV: 87.4 fL (ref 78.0–100.0)
MONO ABS: 0.9 10*3/uL (ref 0.1–1.0)
MONOS PCT: 12 % (ref 3–12)
Neutro Abs: 4.2 10*3/uL (ref 1.7–7.7)
Neutrophils Relative %: 51 % (ref 43–77)
Platelets: 225 10*3/uL (ref 150–400)
RBC: 5.31 MIL/uL (ref 4.22–5.81)
RDW: 13.3 % (ref 11.5–15.5)
WBC: 8 10*3/uL (ref 4.0–10.5)

## 2013-07-25 LAB — LIPID PANEL
CHOL/HDL RATIO: 6.2 ratio
Cholesterol: 230 mg/dL — ABNORMAL HIGH (ref 0–200)
HDL: 37 mg/dL — AB (ref >39)
LDL Cholesterol: 161 mg/dL — ABNORMAL HIGH (ref 0–99)
Triglycerides: 159 mg/dL — ABNORMAL HIGH (ref <150)
VLDL: 32 mg/dL (ref 0–40)

## 2013-07-25 LAB — GLUCOSE, POCT (MANUAL RESULT ENTRY): POC Glucose: 112 mg/dl — AB (ref 70–99)

## 2013-07-25 LAB — POCT GLYCOSYLATED HEMOGLOBIN (HGB A1C): Hemoglobin A1C: 5.5

## 2013-07-25 NOTE — Progress Notes (Signed)
Patient here for blood work only.

## 2013-07-26 ENCOUNTER — Encounter: Payer: Self-pay | Admitting: Emergency Medicine

## 2013-07-26 ENCOUNTER — Ambulatory Visit (INDEPENDENT_AMBULATORY_CARE_PROVIDER_SITE_OTHER): Payer: PRIVATE HEALTH INSURANCE | Admitting: Emergency Medicine

## 2013-07-26 VITALS — BP 120/72 | HR 62 | Temp 98.3°F | Resp 16 | Ht 71.0 in | Wt 171.0 lb

## 2013-07-26 DIAGNOSIS — C61 Malignant neoplasm of prostate: Secondary | ICD-10-CM

## 2013-07-26 DIAGNOSIS — E785 Hyperlipidemia, unspecified: Secondary | ICD-10-CM

## 2013-07-26 DIAGNOSIS — F17200 Nicotine dependence, unspecified, uncomplicated: Secondary | ICD-10-CM

## 2013-07-26 DIAGNOSIS — B1920 Unspecified viral hepatitis C without hepatic coma: Secondary | ICD-10-CM

## 2013-07-26 DIAGNOSIS — Z0000 Encounter for general adult medical examination without abnormal findings: Secondary | ICD-10-CM

## 2013-07-26 LAB — POCT URINALYSIS DIPSTICK
Bilirubin, UA: NEGATIVE
GLUCOSE UA: NEGATIVE
Ketones, UA: NEGATIVE
Leukocytes, UA: NEGATIVE
Nitrite, UA: NEGATIVE
PROTEIN UA: NEGATIVE
RBC UA: NEGATIVE
SPEC GRAV UA: 1.02
UROBILINOGEN UA: 0.2
pH, UA: 6

## 2013-07-26 NOTE — Addendum Note (Signed)
Addended by: Orlene Erm on: 07/26/2013 11:21 AM   Modules accepted: Orders

## 2013-07-26 NOTE — Progress Notes (Signed)
   Subjective:    Patient ID: George Watson, male    DOB: 30-Jun-1946, 68 y.o.   MRN: [id number]  HPI    Review of Systems  Constitutional: Negative.   HENT: Negative.   Eyes: Negative.   Respiratory: Positive for shortness of breath and wheezing.   Cardiovascular: Negative.   Gastrointestinal: Negative.   Endocrine: Negative.   Genitourinary: Negative.   Musculoskeletal: Positive for back pain.  Skin: Negative.   Allergic/Immunologic: Negative.   Neurological: Negative.   Hematological: Negative.   Psychiatric/Behavioral: Negative.        Objective:   Physical Exam HEENT exam is unremarkable. Neck is supple. Chest is clear to auscultation and percussion with a few dry rales on the left. Cardiac is a regular rate without murmurs rubs or gallops. The abdomen is soft liver and spleen are not enlarged there are no areas of tenderness extremities are without cyanosis clubbing or edema. Skin exam reveals some scaling of the scalp but otherwise normal Results for orders placed in visit on 07/26/13  POCT URINALYSIS DIPSTICK      Result Value Range   Color, UA yellow     Clarity, UA clear     Glucose, UA neg     Bilirubin, UA neg     Ketones, UA neg     Spec Grav, UA 1.020     Blood, UA neg     pH, UA 6.0     Protein, UA neg     Urobilinogen, UA 0.2     Nitrite, UA neg     Leukocytes, UA Negative           Assessment & Plan:  Patient is stable post treatment for his hep C. He overall is doing well. His cholesterol is elevated this time and he is to be more aggressive about his diet and to take Metamucil as recommended by Dr. Einar Gip. Patient  is concerned about his stroke risk. He had a carotid Doppler study done over 8 years ago ahead and repeat this. I have added a vitamin D level. He's been somewhat depressed recently probably related to seasonal depression. He is not interested in going on medication.

## 2013-07-27 LAB — VITAMIN D 25 HYDROXY (VIT D DEFICIENCY, FRACTURES): Vit D, 25-Hydroxy: 32 ng/mL (ref 30–89)

## 2013-08-07 ENCOUNTER — Encounter: Payer: Self-pay | Admitting: Emergency Medicine

## 2013-08-11 ENCOUNTER — Other Ambulatory Visit: Payer: Self-pay | Admitting: Physician Assistant

## 2013-08-12 NOTE — Telephone Encounter (Signed)
Dr Everlene Farrier, you just saw pt, but don't see notes concerning this med. Can we RF?

## 2013-08-17 ENCOUNTER — Other Ambulatory Visit: Payer: Self-pay | Admitting: Physician Assistant

## 2013-08-23 ENCOUNTER — Other Ambulatory Visit: Payer: Self-pay | Admitting: Emergency Medicine

## 2013-08-26 ENCOUNTER — Other Ambulatory Visit: Payer: Self-pay | Admitting: *Deleted

## 2013-08-26 ENCOUNTER — Encounter: Payer: Self-pay | Admitting: Emergency Medicine

## 2013-08-26 MED ORDER — ALBUTEROL SULFATE HFA 108 (90 BASE) MCG/ACT IN AERS: 2.0000 | INHALATION_SPRAY | Freq: Four times a day (QID) | RESPIRATORY_TRACT | Status: DC | PRN

## 2013-09-01 ENCOUNTER — Encounter (INDEPENDENT_AMBULATORY_CARE_PROVIDER_SITE_OTHER): Payer: Self-pay | Admitting: General Surgery

## 2013-09-01 ENCOUNTER — Ambulatory Visit (INDEPENDENT_AMBULATORY_CARE_PROVIDER_SITE_OTHER): Payer: PRIVATE HEALTH INSURANCE | Admitting: General Surgery

## 2013-09-01 VITALS — BP 122/80 | HR 68 | Resp 14 | Ht 71.0 in | Wt 169.8 lb

## 2013-09-01 DIAGNOSIS — G8918 Other acute postprocedural pain: Secondary | ICD-10-CM

## 2013-09-01 NOTE — Progress Notes (Signed)
Patient ID: George Watson, male   DOB: 08/14/1945, 68 y.o.   MRN: [id number] The patient is a 68 year old male status post open right inguinal hernia repair with mesh per Dr. Lilyan Punt in December 2014.  The patient describes intermittent stabbing/catching sensation in his incision. He states it is not undergoing at this point or not constant.  On Exam: No hernia on exam  Assessment and Plan 68 year old male status post open right inguinal hernia 1. I discussed with him the possible ileal inguinal nerve block. The patient is a this point is not as annoying to proceed with this at this time. I discussed with him should the pain become more constant or bothersome he can return to clinic to try an ilioinguinal nerve block. The patient is in agreement with this at this time. 2. The patient can return to normal activities without restrictions.   Ralene Ok, MD [facility] General & Minimally Invasive Surgery Trauma & Emergency Surgery

## 2013-10-11 ENCOUNTER — Telehealth: Payer: Self-pay

## 2013-10-11 NOTE — Telephone Encounter (Signed)
Palpation l I will put in a future order for him to have his blood work done prior to his appointment

## 2013-10-11 NOTE — Telephone Encounter (Signed)
DAUB - Pt has an appointment with you on April 7.  He usually comes in a week or a few days ahead of time for labs.  Can you go ahead and place an order for the labs and then please call him back to let him know this has been done?  [phone]

## 2013-10-13 ENCOUNTER — Other Ambulatory Visit: Payer: Self-pay | Admitting: Family Medicine

## 2013-10-13 DIAGNOSIS — E119 Type 2 diabetes mellitus without complications: Secondary | ICD-10-CM

## 2013-10-13 DIAGNOSIS — Z79899 Other long term (current) drug therapy: Secondary | ICD-10-CM

## 2013-10-13 DIAGNOSIS — B182 Chronic viral hepatitis C: Secondary | ICD-10-CM

## 2013-10-13 DIAGNOSIS — E7800 Pure hypercholesterolemia, unspecified: Secondary | ICD-10-CM

## 2013-10-17 ENCOUNTER — Other Ambulatory Visit (INDEPENDENT_AMBULATORY_CARE_PROVIDER_SITE_OTHER): Payer: PRIVATE HEALTH INSURANCE | Admitting: *Deleted

## 2013-10-17 DIAGNOSIS — Z79899 Other long term (current) drug therapy: Secondary | ICD-10-CM

## 2013-10-17 DIAGNOSIS — B182 Chronic viral hepatitis C: Secondary | ICD-10-CM

## 2013-10-17 DIAGNOSIS — E7800 Pure hypercholesterolemia, unspecified: Secondary | ICD-10-CM

## 2013-10-17 DIAGNOSIS — E119 Type 2 diabetes mellitus without complications: Secondary | ICD-10-CM

## 2013-10-17 LAB — COMPLETE METABOLIC PANEL WITH GFR
ALT: 21 U/L (ref 0–53)
AST: 19 U/L (ref 0–37)
Albumin: 4.6 g/dL (ref 3.5–5.2)
Alkaline Phosphatase: 52 U/L (ref 39–117)
BUN: 24 mg/dL — AB (ref 6–23)
CALCIUM: 9.8 mg/dL (ref 8.4–10.5)
CHLORIDE: 105 meq/L (ref 96–112)
CO2: 24 meq/L (ref 19–32)
CREATININE: 0.88 mg/dL (ref 0.50–1.35)
GLUCOSE: 112 mg/dL — AB (ref 70–99)
Potassium: 4.3 mEq/L (ref 3.5–5.3)
Sodium: 138 mEq/L (ref 135–145)
Total Bilirubin: 0.7 mg/dL (ref 0.2–1.2)
Total Protein: 7.2 g/dL (ref 6.0–8.3)

## 2013-10-17 LAB — HEMOGLOBIN A1C
Hgb A1c MFr Bld: 5.9 % — ABNORMAL HIGH (ref <5.7)
MEAN PLASMA GLUCOSE: 123 mg/dL — AB (ref <117)

## 2013-10-17 LAB — LIPID PANEL
Cholesterol: 232 mg/dL — ABNORMAL HIGH (ref 0–200)
HDL: 34 mg/dL — ABNORMAL LOW (ref >39)
LDL Cholesterol: 157 mg/dL — ABNORMAL HIGH (ref 0–99)
TRIGLYCERIDES: 205 mg/dL — AB (ref <150)
Total CHOL/HDL Ratio: 6.8 Ratio
VLDL: 41 mg/dL — ABNORMAL HIGH (ref 0–40)

## 2013-10-17 LAB — CBC
HEMATOCRIT: 46.2 % (ref 39.0–52.0)
HEMOGLOBIN: 16.1 g/dL (ref 13.0–17.0)
MCH: 30.9 pg (ref 26.0–34.0)
MCHC: 34.8 g/dL (ref 30.0–36.0)
MCV: 88.7 fL (ref 78.0–100.0)
PLATELETS: 216 10*3/uL (ref 150–400)
RBC: 5.21 MIL/uL (ref 4.22–5.81)
RDW: 13 % (ref 11.5–15.5)
WBC: 7.4 10*3/uL (ref 4.0–10.5)

## 2013-10-17 LAB — GLUCOSE, POCT (MANUAL RESULT ENTRY): POC Glucose: 108 mg/dl — AB (ref 70–99)

## 2013-10-25 ENCOUNTER — Encounter: Payer: Self-pay | Admitting: Emergency Medicine

## 2013-10-25 ENCOUNTER — Ambulatory Visit (INDEPENDENT_AMBULATORY_CARE_PROVIDER_SITE_OTHER): Payer: PRIVATE HEALTH INSURANCE | Admitting: Emergency Medicine

## 2013-10-25 VITALS — BP 128/76 | HR 58 | Temp 97.5°F | Resp 16 | Ht 71.5 in | Wt 170.0 lb

## 2013-10-25 DIAGNOSIS — E119 Type 2 diabetes mellitus without complications: Secondary | ICD-10-CM

## 2013-10-25 DIAGNOSIS — J449 Chronic obstructive pulmonary disease, unspecified: Secondary | ICD-10-CM

## 2013-10-25 DIAGNOSIS — J984 Other disorders of lung: Secondary | ICD-10-CM

## 2013-10-25 DIAGNOSIS — E785 Hyperlipidemia, unspecified: Secondary | ICD-10-CM

## 2013-10-25 DIAGNOSIS — R413 Other amnesia: Secondary | ICD-10-CM

## 2013-10-25 DIAGNOSIS — B1920 Unspecified viral hepatitis C without hepatic coma: Secondary | ICD-10-CM

## 2013-10-25 LAB — MICROALBUMIN, URINE: MICROALB UR: 0.53 mg/dL (ref 0.00–1.89)

## 2013-10-25 NOTE — Progress Notes (Signed)
This chart was scribed for Darlyne Russian, MD by Eston Mould, ED Scribe. This patient was seen in room Room/bed 21 and the patient's care was started at 10:50 AM. Subjective:    Patient ID: George Watson, male    DOB: [birth year], 68 y.o.   MRN: [id number] Chief Complaint  Patient presents with  . Follow-up    3 month   HPI George Watson is a 68 y.o. male who presents to the [facility] for 3 month F/U apt. Pt states he does not want to be put on medications although his cholesterol is not "doing well". Pt states his diet is well and reports doing daily exercises. He states he generally is physically active during the spring and summer. Pt states he did gain some weight over the winter months. Pt states his breathing has been terrible lately. He is unsure if this is due to the weather changes but states he was having a hard time breathing while cutting a tree yesterday afternoon. Pt states he did not have his emergency inhaler but states his breathing improved gradually. He denies having a recent stress test but states his last stress test was done when he had pneumonia many years ago. Pt states he has not smoke in 11 years.  He reports having his last stress was done with Dr. Nadyne Coombes within the last 2-3 years and was told the test is WNL. Pt denies having nerve pain but states he has a "weird sensation". Pt states he generally has prostates testing every 6 months which includes probes and other test. Pt states he will have viral titers in 3-4 weeks and will then be seeing Dr. Richardo Priest.  Pt c/o having a possible spider bite to R forearm and chigger bites to R flank while being outdoors yesterday afternoon. Pt denies having any other bites to his body.  Pt states his wife has noticed a difference in his memory. He reports trying a medication for this.  Review of Systems  Constitutional: Negative for activity change, appetite change and unexpected weight change.  Respiratory: Positive for  shortness of breath (intermittent). Negative for cough and chest tightness.   Gastrointestinal: Negative for abdominal pain.  Skin: Positive for color change. Negative for pallor.  All other systems reviewed and are negative.     Objective:   Physical Exam CONSTITUTIONAL: Well developed/well nourished. Allert cooperative HEAD: Normocephalic/atraumatic EYES: EOMI/PERRL ENMT: Mucous membranes moist NECK: supple no meningeal signs SPINE:entire spine nontender CV: S1/S2 noted, no murmurs/rubs/gallops noted. Dry rales to bilateral bases. Regular rate and rhythm. LUNGS: Lungs are clear to auscultation bilaterally, no apparent distress ABDOMEN: soft, nontender, no rebound or guarding. 1 x1 cm bruise to L upper abdomen.  GU:no cva tenderness NEURO: Pt is awake/alert, moves all extremitiesx4 EXTREMITIES: pulses normal, full ROM SKIN: warm, color normal.Multiple bruises and abrasions to both forearms.  PSYCH: no abnormalities of mood noted  Results for orders placed in visit on 10/17/13  CBC      Result Value Ref Range   WBC 7.4  4.0 - 10.5 K/uL   RBC 5.21  4.22 - 5.81 MIL/uL   Hemoglobin 16.1  13.0 - 17.0 g/dL   HCT 46.2  39.0 - 52.0 %   MCV 88.7  78.0 - 100.0 fL   MCH 30.9  26.0 - 34.0 pg   MCHC 34.8  30.0 - 36.0 g/dL   RDW 13.0  11.5 - 15.5 %   Platelets 216  150 - 400 K/uL  COMPLETE  METABOLIC PANEL WITH GFR      Result Value Ref Range   Sodium 138  135 - 145 mEq/L   Potassium 4.3  3.5 - 5.3 mEq/L   Chloride 105  96 - 112 mEq/L   CO2 24  19 - 32 mEq/L   Glucose, Bld 112 (*) 70 - 99 mg/dL   BUN 24 (*) 6 - 23 mg/dL   Creat 0.88  0.50 - 1.35 mg/dL   Total Bilirubin 0.7  0.2 - 1.2 mg/dL   Alkaline Phosphatase 52  39 - 117 U/L   AST 19  0 - 37 U/L   ALT 21  0 - 53 U/L   Total Protein 7.2  6.0 - 8.3 g/dL   Albumin 4.6  3.5 - 5.2 g/dL   Calcium 9.8  8.4 - 10.5 mg/dL   GFR, Est African American >89     GFR, Est Non African American >89    LIPID PANEL      Result Value Ref  Range   Cholesterol 232 (*) 0 - 200 mg/dL   Triglycerides 205 (*) <150 mg/dL   HDL 34 (*) >39 mg/dL   Total CHOL/HDL Ratio 6.8     VLDL 41 (*) 0 - 40 mg/dL   LDL Cholesterol 157 (*) 0 - 99 mg/dL  HEMOGLOBIN A1C      Result Value Ref Range   Hemoglobin A1C 5.9 (*) <5.7 %   Mean Plasma Glucose 123 (*) <117 mg/dL  GLUCOSE, POCT (MANUAL RESULT ENTRY)      Result Value Ref Range   POC Glucose 108 (*) 70 - 99 mg/dl   Spirometry reading: VFC 61% and FEV1 59% consistent  with Restrictive Lung Disease. Office Spirometry Results: FEV1: 2.07 liters FVC: 2.86 liters FEV1/FVC: 72.4 % FVC  % Predicted: 61 liters FEV % Predicted: 59 liters FeF 25-75: 1.46 liters FeF 25-75 % Predicted: 53  Triage Vitals:BP 128/76  Pulse 58  Temp(Src) 97.5 F (36.4 C)  Resp 16  Ht 5' 11.5" (1.816 m)  Wt 170 lb (77.111 kg)  BMI 23.38 kg/m2  SpO2 98% Assessment & Plan:  Patient currently stable. We decided to hold off on treatment of his elevated cholesterol because of his hep C. His primary function tests are stable with a PFT showing unrestricted lung disease. He continues regular followup with [name] and with his urologist. Referral made to neurology because of some recent difficulty with memory  I personally performed the services described in this documentation, which was scribed in my presence. The recorded information has been reviewed and is accurate.

## 2013-10-27 ENCOUNTER — Ambulatory Visit
Admission: RE | Admit: 2013-10-27 | Discharge: 2013-10-27 | Disposition: A | Discharge status: Home / Self Care | Payer: PRIVATE HEALTH INSURANCE | Source: Ambulatory Visit | Attending: Emergency Medicine | Admitting: Emergency Medicine

## 2013-10-27 DIAGNOSIS — F17200 Nicotine dependence, unspecified, uncomplicated: Secondary | ICD-10-CM

## 2013-10-28 ENCOUNTER — Encounter: Payer: Self-pay | Admitting: Emergency Medicine

## 2013-12-05 ENCOUNTER — Other Ambulatory Visit: Payer: Self-pay | Admitting: *Deleted

## 2013-12-05 DIAGNOSIS — R413 Other amnesia: Secondary | ICD-10-CM

## 2013-12-06 ENCOUNTER — Ambulatory Visit
Admission: RE | Admit: 2013-12-06 | Discharge: 2013-12-06 | Disposition: A | Discharge status: Home / Self Care | Payer: PRIVATE HEALTH INSURANCE | Source: Ambulatory Visit | Attending: *Deleted | Admitting: *Deleted

## 2013-12-06 DIAGNOSIS — R413 Other amnesia: Secondary | ICD-10-CM

## 2014-01-13 ENCOUNTER — Other Ambulatory Visit: Payer: Self-pay | Admitting: Gastroenterology

## 2014-01-13 DIAGNOSIS — B182 Chronic viral hepatitis C: Secondary | ICD-10-CM

## 2014-01-17 ENCOUNTER — Other Ambulatory Visit: Payer: PRIVATE HEALTH INSURANCE

## 2014-01-24 ENCOUNTER — Ambulatory Visit (INDEPENDENT_AMBULATORY_CARE_PROVIDER_SITE_OTHER): Payer: PRIVATE HEALTH INSURANCE | Admitting: Emergency Medicine

## 2014-01-24 ENCOUNTER — Encounter: Payer: Self-pay | Admitting: Emergency Medicine

## 2014-01-24 VITALS — BP 147/85 | HR 58 | Temp 98.5°F | Resp 16 | Ht 71.0 in | Wt 169.6 lb

## 2014-01-24 DIAGNOSIS — R21 Rash and other nonspecific skin eruption: Secondary | ICD-10-CM

## 2014-01-24 DIAGNOSIS — E119 Type 2 diabetes mellitus without complications: Secondary | ICD-10-CM

## 2014-01-24 LAB — POCT GLYCOSYLATED HEMOGLOBIN (HGB A1C): Hemoglobin A1C: 5.6

## 2014-01-24 LAB — POCT SKIN KOH: SKIN KOH, POC: NEGATIVE

## 2014-01-24 MED ORDER — PREDNISONE 20 MG PO TABS: ORAL_TABLET | ORAL | Status: DC

## 2014-01-24 MED ORDER — MUPIROCIN 2 % EX OINT: TOPICAL_OINTMENT | CUTANEOUS | Status: DC

## 2014-01-24 NOTE — Progress Notes (Signed)
   Subjective:    Patient ID: George Watson, male    DOB: [birth year], 68 y.o.   MRN: [id number]  HPI patient in for recheck. He is doing well. He has no detectable virus. He was doing well until this weekend when he strained his back. He is currently seeing his chiropractor but continues to have significant pain in his back with some symptoms down his right leg. He also has a scaly area on his left anterior  lower thigh he is applying Bactroban to.    Review of Systems     Objective:   Physical Exam patient is alert and cooperative in no distress. Neck supple. Chest clear to auscultation and percussion. Heart regular rate no murmurs rubs or gallops. Abdomen soft liver, spleen,not enlarged.   Results for orders placed in visit on 01/24/14  POCT GLYCOSYLATED HEMOGLOBIN (HGB A1C)      Result Value Ref Range   Hemoglobin A1C 5.6        Assessment & Plan:  He was given a prescription for prednisone. KOH done of the lesion left leg. Bactroban was refilled for this. If he calls on Thursday okay to give a limited supply of Ultram to be dispensed to his wife to be given the patient for pain. He will continue to see the chiropractor and will not take his prednisone unless he worsens

## 2014-01-27 ENCOUNTER — Other Ambulatory Visit: Payer: Self-pay | Admitting: Emergency Medicine

## 2014-01-27 ENCOUNTER — Telehealth: Payer: Self-pay | Admitting: Emergency Medicine

## 2014-01-27 MED ORDER — TRAMADOL HCL 50 MG PO TABS: 50.0000 mg | ORAL_TABLET | Freq: Three times a day (TID) | ORAL | Status: DC | PRN

## 2014-01-27 NOTE — Telephone Encounter (Signed)
I called and sent a limited supply of Ultram to the pharmacy. I will call his wife leave so she can pick up the prescription and be in charge of them. This is for the patient's back pain.

## 2014-02-05 ENCOUNTER — Telehealth: Payer: Self-pay | Admitting: Emergency Medicine

## 2014-02-05 ENCOUNTER — Other Ambulatory Visit: Payer: Self-pay | Admitting: Emergency Medicine

## 2014-02-05 DIAGNOSIS — M5116 Intervertebral disc disorders with radiculopathy, lumbar region: Secondary | ICD-10-CM

## 2014-02-05 MED ORDER — TRAMADOL HCL 50 MG PO TABS: 50.0000 mg | ORAL_TABLET | Freq: Three times a day (TID) | ORAL | Status: DC | PRN

## 2014-02-05 NOTE — Telephone Encounter (Signed)
Persistent pain in his back with radiculopathy. Ultram refilled prescription written for Ultram to take 3 per day

## 2014-02-05 NOTE — Telephone Encounter (Signed)
I called Leigh and was trying to let her know that I had a rx for her to p/u that you gave me, but she said she had already picked one up earlier today. Tore up the other rx and shredded it.

## 2014-02-06 ENCOUNTER — Ambulatory Visit
Admission: RE | Admit: 2014-02-06 | Discharge: 2014-02-06 | Disposition: A | Discharge status: Home / Self Care | Payer: PRIVATE HEALTH INSURANCE | Source: Ambulatory Visit | Attending: Gastroenterology | Admitting: Gastroenterology

## 2014-02-06 DIAGNOSIS — B182 Chronic viral hepatitis C: Secondary | ICD-10-CM

## 2014-02-08 ENCOUNTER — Other Ambulatory Visit: Payer: Self-pay | Admitting: Emergency Medicine

## 2014-02-08 DIAGNOSIS — M545 Low back pain: Secondary | ICD-10-CM

## 2014-02-12 ENCOUNTER — Telehealth: Payer: Self-pay | Admitting: Emergency Medicine

## 2014-02-12 MED ORDER — TRAMADOL HCL 50 MG PO TABS: 50.0000 mg | ORAL_TABLET | Freq: Four times a day (QID) | ORAL | Status: DC | PRN

## 2014-02-12 MED ORDER — PREDNISONE 10 MG PO TABS: ORAL_TABLET | ORAL | Status: DC

## 2014-02-12 NOTE — Telephone Encounter (Signed)
I received a phone call from the patient that he is not getting any relief with the Ultram. He is going to take 2 Ultram in the morning. He is already scheduled to have his MRI and has an appointment to see Dr. Arnoldo Morale. if the 2 Ultram helped will repeat dose pack and increase his Ultram dose until we get the results of his MRI

## 2014-02-12 NOTE — Telephone Encounter (Signed)
Patient notified prednisone sent to pharmacy and ultram ready for pick up. Also he will recheck with me Wednesday 02/15/14 anytime between 8-12.

## 2014-02-14 ENCOUNTER — Ambulatory Visit
Admission: RE | Admit: 2014-02-14 | Discharge: 2014-02-14 | Disposition: A | Discharge status: Home / Self Care | Payer: PRIVATE HEALTH INSURANCE | Source: Ambulatory Visit | Attending: Emergency Medicine | Admitting: Emergency Medicine

## 2014-02-14 DIAGNOSIS — M545 Low back pain: Secondary | ICD-10-CM

## 2014-02-15 ENCOUNTER — Ambulatory Visit (INDEPENDENT_AMBULATORY_CARE_PROVIDER_SITE_OTHER): Payer: PRIVATE HEALTH INSURANCE | Admitting: Emergency Medicine

## 2014-02-15 VITALS — BP 144/84 | HR 62 | Temp 97.4°F | Resp 18 | Ht 71.0 in | Wt 171.0 lb

## 2014-02-15 DIAGNOSIS — M5126 Other intervertebral disc displacement, lumbar region: Secondary | ICD-10-CM

## 2014-02-15 DIAGNOSIS — M5137 Other intervertebral disc degeneration, lumbosacral region: Secondary | ICD-10-CM | POA: Insufficient documentation

## 2014-02-15 NOTE — Progress Notes (Signed)
Subjective:  This chart was scribed for [name], MD by Mercy Moore, Medial Scribe. This patient was seen in room 11 and the patient's care was started at 9:17 AM.    Patient ID: George Watson, male    DOB: [birth year], 68 y.o.   MRN: [id number]  HPI HPI Comments: George Watson is a 68 y.o. male who presents to the [facility] requesting follow up evaluation for his lower back pain. At his last visit dating 01/24/14 patient strained his back and was experiencing resulting lower back pain with radiation into his right leg. Patient has a recent MRI; finding indicate degenerative disc disease. Today patient reports recent over exertion while assisting with [employer]. He reports that radiation of his lower back pain can sometimes extend in his right buttock's or as far as his right calf. Patient's current medication schedule: Prednisone every 6 hours. Patient reports that his pain levels are currently manageable. Patient is quite, but has been advised to take it easy and avoid participation in strenuous activities.    Patient Active Problem List   Diagnosis Date Noted  . Diabetes 05/18/2012  . Ureteral calculus, right 02/02/2012  . Essential hypertension, benign   . H/O drug abuse   . Other and unspecified hyperlipidemia   . HSV-2 (herpes simplex virus 2) infection   . Elevated PSA   . COPD (chronic obstructive pulmonary disease)   . BPH (benign prostatic hypertrophy)   . DDD (degenerative disc disease), lumbosacral   . Prostate cancer   . Hepatitis C 10/19/1996   Past Medical History  Diagnosis Date  . Hepatitis C 10/1996  . H/O drug abuse     multisubstance  . Other and unspecified hyperlipidemia   . HSV-2 (herpes simplex virus 2) infection   . Elevated PSA   . COPD (chronic obstructive pulmonary disease)   . BPH (benign prostatic hypertrophy)   . Inguinal hernia     right  . Hx of biopsy 08/2004    liver  . DDD (degenerative disc disease),  lumbosacral   . Essential hypertension, benign     diet controlled  . GERD (gastroesophageal reflux disease)   . Prostate cancer   . Fracture of shaft of clavicle   . Substance abuse   . Tuberculosis    Past Surgical History  Procedure Laterality Date  . Fracture ribs      3  . Cervical spine surgery    . Right shoulder arthroscopy  2011  . Right rotator cuff  1992  . Left knee meniscus repair  1992  . Inguinal hernia repair  07/01/2012    Procedure: HERNIA REPAIR INGUINAL ADULT;  Surgeon: Madilyn Hook, DO;  Location: [facility];  Service: General;  Laterality: Right;  with Mesh  . Hernia repair     No Known Allergies Prior to Admission medications   Medication Sig Start Date End Date Taking? Authorizing Provider  albuterol (PROAIR HFA) 108 (90 BASE) MCG/ACT inhaler Inhale 2 puffs into the lungs every 6 (six) hours as needed for shortness of breath. 08/26/13   Darlyne Russian, MD  aspirin 81 MG tablet Take 81 mg by mouth daily.    Historical Provider, MD  dutasteride (AVODART) 0.5 MG capsule Take 0.5 mg by mouth daily.    Historical Provider, MD  famotidine (PEPCID) 20 MG tablet Take 40 mg by mouth 2 (two) times daily.     Historical Provider, MD  Glucosamine-Chondroit-Vit C-Mn (GLUCOSAMINE 1500 COMPLEX  PO) Take 2 tablets by mouth daily with breakfast.     Historical Provider, MD  metaxalone (SKELAXIN) 800 MG tablet Take 800 mg by mouth 3 (three) times daily as needed. For back pain    Historical Provider, MD  Multiple Vitamin (MULTIVITAMIN) tablet Take 1 tablet by mouth daily.    Historical Provider, MD  mupirocin ointment (BACTROBAN) 2 % Applied to the irritated area twice a day 01/24/14   Darlyne Russian, MD  Omega-3 Fatty Acids (FISH OIL) 1000 MG CAPS Take 1 capsule by mouth 3 (three) times daily.     Historical Provider, MD  oxybutynin (DITROPAN) 5 MG tablet Take 5 mg by mouth at bedtime.    Historical Provider, MD  predniSONE (DELTASONE) 10 MG tablet 4,4,4,3,3,3,2,2,2,1,1,1 02/12/14    Darlyne Russian, MD  predniSONE (DELTASONE) 20 MG tablet Take 3 a day for 3 days 2 a day for 3 days one a day for 3 days 01/24/14   Darlyne Russian, MD  QVAR 80 MCG/ACT inhaler INHALE TWO PUFFS TWICE DAILY 08/11/13   Darlyne Russian, MD  Tamsulosin HCl (FLOMAX) 0.4 MG CAPS Take 0.4 mg by mouth daily after breakfast.    Historical Provider, MD  traMADol (ULTRAM) 50 MG tablet Take 1 tablet (50 mg total) by mouth every 6 (six) hours as needed for severe pain. 02/12/14   Darlyne Russian, MD  VIAGRA 100 MG tablet TAKE 1/2-1 TABLET(S) ONE HOUR PRIOR TO INTERCOURSE 12/08/12   Theda Sers, PA-C   History   Social History  . Marital Status: Married    Spouse Name: N/A    Number of Children: N/A  . Years of Education: N/A   Occupational History  . Not on file.   Social History Main Topics  . Smoking status: Former Smoker -- 43 years    Types: Cigarettes    Quit date: 07/21/2002  . Smokeless tobacco: Never Used  . Alcohol Use: No  . Drug Use: No     Comment: 26 years ago cocaine, heroin. methadone  . Sexual Activity: Not on file     Comment: number of sex partners in the last 49 months  1   Other Topics Concern  . Not on file   Social History Narrative   Married to [employer]. Exercise cardio daily for 30 minutes. Education: [employer].      Review of Systems  Constitutional: Negative for fever and chills.  Musculoskeletal: Positive for back pain.       Objective:   Physical Exam  CONSTITUTIONAL: Well developed/well nourished HEAD: Normocephalic/atraumatic EYES: EOMI/PERRL ENMT: Mucous membranes moist NECK: supple no meningeal signs SPINE: tender over lower lumbar spine, straight leg positive at 80 deg on the right CV: S1/S2 noted, no murmurs/rubs/gallops noted LUNGS: Lungs are clear to auscultation bilaterally, no apparent distress ABDOMEN: soft, nontender, no rebound or guarding GU: no cva tenderness NEURO: Pt is awake/alert, moves all extremitiesx4 EXTREMITIES: pulses normal,  full ROM SKIN: warm, color normal PSYCH: no abnormalities of mood noted  Filed Vitals:   02/15/14 0910  BP: 144/84  Pulse: 62  Temp: 97.4 F (36.3 C)  TempSrc: Oral  Resp: 18  Height: 5\' 11"  (1.803 m)  Weight: 171 lb (77.565 kg)  SpO2: 95%        Assessment & Plan:  Patient has progressive findings on his MRI with disc disease having on the S1 nerve port on the right. He is finishing a prednisone Dosepak. He has his Ultram  to take for pain. We will talk with the nurse coordinator at Dr. Arnoldo Morale office and see if we can asked about his appointment.  I personally performed the services described in this documentation, which was scribed in my presence. The recorded information has been reviewed and is accurate.

## 2014-02-16 ENCOUNTER — Telehealth: Payer: Self-pay | Admitting: *Deleted

## 2014-02-16 NOTE — Telephone Encounter (Signed)
Dr. Everlene Farrier has signed a 6 month temp handicap DMV form. Pt notified. Put in the pick up drawer.

## 2014-02-18 ENCOUNTER — Other Ambulatory Visit: Payer: PRIVATE HEALTH INSURANCE

## 2014-02-20 ENCOUNTER — Other Ambulatory Visit: Payer: Self-pay

## 2014-02-20 MED ORDER — TRAMADOL HCL 50 MG PO TABS: 50.0000 mg | ORAL_TABLET | Freq: Four times a day (QID) | ORAL | Status: DC | PRN

## 2014-02-21 NOTE — Telephone Encounter (Signed)
Called patient . There is a Rx in the box at 102 he or his wife can pick up, I advised him and they will pick up.

## 2014-02-24 ENCOUNTER — Other Ambulatory Visit: Payer: Self-pay | Admitting: Emergency Medicine

## 2014-02-26 ENCOUNTER — Telehealth: Payer: Self-pay | Admitting: Radiology

## 2014-02-26 ENCOUNTER — Other Ambulatory Visit: Payer: Self-pay | Admitting: Radiology

## 2014-02-26 MED ORDER — TRAMADOL HCL 50 MG PO TABS: 50.0000 mg | ORAL_TABLET | Freq: Three times a day (TID) | ORAL | Status: DC

## 2014-02-26 NOTE — Telephone Encounter (Signed)
Spoke to patient he does want to increase the Tramadol dose he is using 4/ day and it helps some. When I called he was in bed asleep. He mowed yesterday which increased the pain. I did tell him lying down for extended periods will sometimes increase the pain.  I sent him some stretches he can try to ease the pain now. I told him I will call tomorrow to see if we can get him in sooner with the Neurosurgeon/ please advise on the Tramadol refill at higher dose

## 2014-02-26 NOTE — Telephone Encounter (Signed)
Called in Rx for Tramadol with instructions for his wife to pick up for him. He will send her to get this. I told him I will call Monday to try to get him in sooner with the Neurosurgeon

## 2014-03-06 ENCOUNTER — Other Ambulatory Visit: Payer: Self-pay | Admitting: Emergency Medicine

## 2014-03-07 ENCOUNTER — Other Ambulatory Visit: Payer: Self-pay | Admitting: Emergency Medicine

## 2014-03-07 ENCOUNTER — Other Ambulatory Visit: Payer: Self-pay | Admitting: Radiology

## 2014-03-07 DIAGNOSIS — M545 Low back pain: Secondary | ICD-10-CM

## 2014-03-07 MED ORDER — TRAMADOL HCL 50 MG PO TABS: 50.0000 mg | ORAL_TABLET | Freq: Three times a day (TID) | ORAL | Status: DC

## 2014-04-18 ENCOUNTER — Encounter: Payer: Self-pay | Admitting: Emergency Medicine

## 2014-04-21 ENCOUNTER — Ambulatory Visit (INDEPENDENT_AMBULATORY_CARE_PROVIDER_SITE_OTHER): Payer: PRIVATE HEALTH INSURANCE | Admitting: Radiology

## 2014-04-21 DIAGNOSIS — Z23 Encounter for immunization: Secondary | ICD-10-CM

## 2014-04-24 ENCOUNTER — Ambulatory Visit (INDEPENDENT_AMBULATORY_CARE_PROVIDER_SITE_OTHER): Payer: PRIVATE HEALTH INSURANCE

## 2014-04-24 ENCOUNTER — Ambulatory Visit (INDEPENDENT_AMBULATORY_CARE_PROVIDER_SITE_OTHER): Payer: PRIVATE HEALTH INSURANCE | Admitting: Emergency Medicine

## 2014-04-24 VITALS — BP 144/88 | HR 68 | Temp 98.0°F | Resp 17 | Ht 71.0 in | Wt 175.0 lb

## 2014-04-24 DIAGNOSIS — J069 Acute upper respiratory infection, unspecified: Secondary | ICD-10-CM

## 2014-04-24 DIAGNOSIS — R059 Cough, unspecified: Secondary | ICD-10-CM

## 2014-04-24 DIAGNOSIS — R05 Cough: Secondary | ICD-10-CM

## 2014-04-24 LAB — POCT CBC
Granulocyte percent: 69.5 %G (ref 37–80)
HEMATOCRIT: 48.5 % (ref 43.5–53.7)
Hemoglobin: 15.8 g/dL (ref 14.1–18.1)
Lymph, poc: 2.3 (ref 0.6–3.4)
MCH: 30.9 pg (ref 27–31.2)
MCHC: 32.6 g/dL (ref 31.8–35.4)
MCV: 94.8 fL (ref 80–97)
MID (CBC): 1.2 — AB (ref 0–0.9)
MPV: 7.3 fL (ref 0–99.8)
PLATELET COUNT, POC: 258 10*3/uL (ref 142–424)
POC Granulocyte: 7.9 — AB (ref 2–6.9)
POC LYMPH %: 20.3 % (ref 10–50)
POC MID %: 10.2 %M (ref 0–12)
RBC: 5.11 M/uL (ref 4.69–6.13)
RDW, POC: 12.9 %
WBC: 11.4 10*3/uL — AB (ref 4.6–10.2)

## 2014-04-24 LAB — POCT INFLUENZA A/B
INFLUENZA B, POC: NEGATIVE
Influenza A, POC: NEGATIVE

## 2014-04-24 MED ORDER — CEFDINIR 300 MG PO CAPS: 600.0000 mg | ORAL_CAPSULE | Freq: Every day | ORAL | Status: DC

## 2014-04-24 NOTE — Progress Notes (Signed)
Subjective:    Patient ID: George Watson, male    DOB: 12/12/1945, 68 y.o.   MRN: [id number] This chart was scribed for [name], MD by Marti Sleigh, Medical Scribe. This patient was seen in Room 13 and the patient's care was started at 10:24 AM.  HPI HPI Comments: George Watson is a 68 y.o. male with a past hx of COPD, DM, Hep C who presents to the Emergency Department complaining of URI that started two days ago. Pt endorses cough with clear productive drainage as an associated symptom. Pt states SOB increase from baseline. Pt states that he is going to have back surgery on L4-5 in three days. Pt states that he had a flu shot three days ago.     Review of Systems  Constitutional: Negative for fever and chills.  HENT: Negative for congestion, ear discharge and sinus pressure.   Eyes: Negative for discharge.  Respiratory: Positive for cough, chest tightness and shortness of breath.   Cardiovascular: Negative for chest pain.  Gastrointestinal: Negative for abdominal pain and diarrhea.  Genitourinary: Negative for frequency and hematuria.  Musculoskeletal: Negative for back pain.  Skin: Negative for rash.  Neurological: Negative for seizures and headaches.  Psychiatric/Behavioral: Negative for hallucinations.       Objective:   Physical Exam  Constitutional: He is oriented to person, place, and time. He appears well-developed.  HENT:  Head: Normocephalic.  Eyes: Conjunctivae and EOM are normal. No scleral icterus.  Neck: Neck supple. No thyromegaly present.  Cardiovascular: Normal rate and regular rhythm.  Exam reveals no gallop and no friction rub.   No murmur heard. Pulmonary/Chest: No stridor. He has no wheezes. He has rales (Mid-lung, bilaterally.). He exhibits no tenderness.  Abdominal: He exhibits no distension. There is no tenderness. There is no rebound.  Musculoskeletal: Normal range of motion. He exhibits no edema.  Lymphadenopathy:    He has no cervical  adenopathy.  Neurological: He is alert and oriented to person, place, and time. He exhibits normal muscle tone. Coordination normal.  Skin: No rash noted. No erythema.  Psychiatric: He has a normal mood and affect. His behavior is normal.   Results for orders placed in visit on 04/24/14  POCT CBC      Result Value Ref Range   WBC 11.4 (*) 4.6 - 10.2 K/uL   Lymph, poc 2.3  0.6 - 3.4   POC LYMPH PERCENT 20.3  10 - 50 %L   MID (cbc) 1.2 (*) 0 - 0.9   POC MID % 10.2  0 - 12 %M   POC Granulocyte 7.9 (*) 2 - 6.9   Granulocyte percent 69.5  37 - 80 %G   RBC 5.11  4.69 - 6.13 M/uL   Hemoglobin 15.8  14.1 - 18.1 g/dL   HCT, POC 48.5  43.5 - 53.7 %   MCV 94.8  80 - 97 fL   MCH, POC 30.9  27 - 31.2 pg   MCHC 32.6  31.8 - 35.4 g/dL   RDW, POC 12.9     Platelet Count, POC 258  142 - 424 K/uL   MPV 7.3  0 - 99.8 fL  POCT INFLUENZA A/B      Result Value Ref Range   Influenza A, POC Negative     Influenza B, POC Negative    [facility] reading (PRIMARY) by  Dr.Kellis Topete chest x-ray shows minimal increased lower lung markings no pneumonic infiltrates heart size normal.  Assessment & Plan:  White count is elevated. We'll treat with Omnicef. We'll notify Dr. Arnoldo Morale regarding his respiratory illness. I personally performed the services described in this documentation, which was scribed in my presence. The recorded information has been reviewed and is accurate.

## 2014-05-01 ENCOUNTER — Telehealth: Payer: Self-pay

## 2014-05-01 NOTE — Telephone Encounter (Signed)
Patient requesting a refill on "metaxalone 800 mg". Please call in to [facility] on [address]. Patients call back number is [phone]

## 2014-05-02 ENCOUNTER — Other Ambulatory Visit: Payer: Self-pay | Admitting: Emergency Medicine

## 2014-05-02 ENCOUNTER — Ambulatory Visit: Payer: PRIVATE HEALTH INSURANCE | Admitting: Emergency Medicine

## 2014-05-02 MED ORDER — METAXALONE 800 MG PO TABS: 800.0000 mg | ORAL_TABLET | Freq: Three times a day (TID) | ORAL | Status: DC | PRN

## 2014-05-02 NOTE — Telephone Encounter (Signed)
Notified pt rx's are ready.

## 2014-05-02 NOTE — Telephone Encounter (Signed)
Please call his prescriptions are ready

## 2014-05-09 ENCOUNTER — Ambulatory Visit (INDEPENDENT_AMBULATORY_CARE_PROVIDER_SITE_OTHER): Payer: PRIVATE HEALTH INSURANCE | Admitting: Emergency Medicine

## 2014-05-09 ENCOUNTER — Encounter: Payer: Self-pay | Admitting: Emergency Medicine

## 2014-05-09 VITALS — BP 132/70 | HR 73 | Temp 97.7°F | Resp 16 | Ht 71.0 in | Wt 173.4 lb

## 2014-05-09 DIAGNOSIS — J441 Chronic obstructive pulmonary disease with (acute) exacerbation: Secondary | ICD-10-CM

## 2014-05-09 DIAGNOSIS — M5137 Other intervertebral disc degeneration, lumbosacral region: Secondary | ICD-10-CM

## 2014-05-09 DIAGNOSIS — E119 Type 2 diabetes mellitus without complications: Secondary | ICD-10-CM

## 2014-05-09 LAB — POCT GLYCOSYLATED HEMOGLOBIN (HGB A1C): Hemoglobin A1C: 5.5

## 2014-05-09 LAB — GLUCOSE, POCT (MANUAL RESULT ENTRY): POC GLUCOSE: 71 mg/dL (ref 70–99)

## 2014-05-09 NOTE — Patient Instructions (Signed)
Return to clinic in 3 months

## 2014-05-09 NOTE — Progress Notes (Signed)
Subjective:    Patient ID: George Watson, male    DOB: May 16, 1946, 68 y.o.   MRN: [id number]  HPI  Past Medical History  Diagnosis Date  . Hepatitis C 10/1996  . H/O drug abuse     multisubstance  . Other and unspecified hyperlipidemia   . HSV-2 (herpes simplex virus 2) infection   . Elevated PSA   . COPD (chronic obstructive pulmonary disease)   . BPH (benign prostatic hypertrophy)   . Inguinal hernia     right  . Hx of biopsy 08/2004    liver  . DDD (degenerative disc disease), lumbosacral   . Essential hypertension, benign     diet controlled  . GERD (gastroesophageal reflux disease)   . Prostate cancer   . Fracture of shaft of clavicle   . Substance abuse   . Tuberculosis    This is a 68 year old with a PMH listed above here for a follow up of a URI.  He is feeling better with no symptoms.  He denies cough or abnormal dyspnea.  He has the same baseline wheezing, but does not effect his daily activities or exacerbated by exertion indoor or outdoor.    DM: He does not keep a daily fasting glucose monitoring, but he denies any nausea, dizziness, tremulousness,visual changes, or abnormal polyuria.  He keeps a good diet, and exercises daily.     Herniated Lumbar Disc: Scheduled for disc repair on 05/17/2014.  He is also being followed by a chiropractor and does daily exercise for his core.  He is using tylenol for his back pain which helps, but denies any current incontinence, or numbness.     Review of Systems  Constitutional: Negative for fever, chills and fatigue.  Respiratory: Positive for wheezing (baseline wheezing). Negative for cough and chest tightness.   Cardiovascular: Negative for palpitations.  Gastrointestinal: Negative for nausea, vomiting and diarrhea.  Skin: Negative for rash.  Neurological: Negative for dizziness, tremors and weakness.       Objective:   Physical Exam  Constitutional: He is oriented to person, place, and time. He appears  well-developed and well-nourished.  BP 132/70  Pulse 73  Temp(Src) 97.7 F (36.5 C) (Oral)  Resp 16  Ht 5\' 11"  (1.803 m)  Wt 173 lb 6.4 oz (78.654 kg)  BMI 24.20 kg/m2  SpO2 97%  HENT:  Head: Normocephalic and atraumatic.  Eyes: Conjunctivae are normal. Pupils are equal, round, and reactive to light.  Cardiovascular: Normal rate, regular rhythm and normal heart sounds.  Exam reveals no gallop and no friction rub.   No murmur heard. Pulmonary/Chest: Effort normal. No respiratory distress. He has wheezes (Some diffuse wheezing, however consistent with his baseline COPD).  Abdominal: Soft. Bowel sounds are normal. He exhibits no distension and no mass. There is no tenderness.  Musculoskeletal: He exhibits no edema and no tenderness.  Neurological: He is alert and oriented to person, place, and time.  Skin: Skin is warm and dry. No rash (No spider angiomata detected) noted.  Psychiatric: He has a normal mood and affect. His behavior is normal. Judgment and thought content normal.   Results for orders placed in visit on 05/09/14  GLUCOSE, POCT (MANUAL RESULT ENTRY)      Result Value Ref Range   POC Glucose 71  70 - 99 mg/dl  POCT GLYCOSYLATED HEMOGLOBIN (HGB A1C)      Result Value Ref Range   Hemoglobin A1C 5.5  Assessment & Plan:  COPD exacerbation -Stable, will follow up in 3 months  Degeneration of lumbar or lumbosacral intervertebral disc -Stable, followed by chiropractor.  Scheduled surgery 05/17/2014.  Will follow up in 3 months  Diabetes type 2, controlled - Plan: POCT glucose (manual entry), POCT glycosylated hemoglobin (Hb A1C) Good a1c, Lifestyle regimen is working well for his DM.  will follow up in 3 months.  George Drape, PA-C [facility] 10/20/20154:58 PM   George Drape, PA-C [facility] 10/20/20154:10 PM

## 2014-05-29 ENCOUNTER — Other Ambulatory Visit: Payer: Self-pay | Admitting: Emergency Medicine

## 2014-06-12 ENCOUNTER — Ambulatory Visit (INDEPENDENT_AMBULATORY_CARE_PROVIDER_SITE_OTHER): Payer: PRIVATE HEALTH INSURANCE | Admitting: Emergency Medicine

## 2014-06-12 VITALS — BP 146/70 | HR 67 | Temp 97.7°F | Resp 16 | Ht 71.0 in | Wt 171.0 lb

## 2014-06-12 DIAGNOSIS — J441 Chronic obstructive pulmonary disease with (acute) exacerbation: Secondary | ICD-10-CM

## 2014-06-12 MED ORDER — CLARITHROMYCIN 500 MG PO TABS: 500.0000 mg | ORAL_TABLET | Freq: Two times a day (BID) | ORAL | Status: DC

## 2014-06-12 NOTE — Patient Instructions (Signed)

## 2014-06-12 NOTE — Progress Notes (Signed)
[facility] [address], [place] [address] [phone]  Date:  06/12/2014   Name:  George Watson   DOB:  1946-03-06   MRN:  [id number]  PCP:  Jenny Reichmann, MD    Chief Complaint: Cough; chest congestion; and chest tightness   History of Present Illness:  George Watson is a 68 y.o. very pleasant male patient who presents with the following:  History of COPD.  Has increased shortness of breath and wheezing.  Cough productive of purulent sputum. No nasal congestion or drainage No fever or chills No nausea or vomiting.  No stool change No improvement with over the counter medications or other home remedies.  Denies other complaint or health concern today.   Patient Active Problem List   Diagnosis Date Noted  . Degeneration of lumbar or lumbosacral intervertebral disc 02/15/2014  . Diabetes 05/18/2012  . Ureteral calculus, right 02/02/2012  . Essential hypertension, benign   . H/O drug abuse   . Other and unspecified hyperlipidemia   . HSV-2 (herpes simplex virus 2) infection   . Elevated PSA   . COPD (chronic obstructive pulmonary disease)   . BPH (benign prostatic hypertrophy)   . DDD (degenerative disc disease), lumbosacral   . Prostate cancer   . Hepatitis C 10/19/1996    Past Medical History  Diagnosis Date  . Hepatitis C 10/1996  . H/O drug abuse     multisubstance  . Other and unspecified hyperlipidemia   . HSV-2 (herpes simplex virus 2) infection   . Elevated PSA   . COPD (chronic obstructive pulmonary disease)   . BPH (benign prostatic hypertrophy)   . Inguinal hernia     right  . Hx of biopsy 08/2004    liver  . DDD (degenerative disc disease), lumbosacral   . Essential hypertension, benign     diet controlled  . GERD (gastroesophageal reflux disease)   . Prostate cancer   . Fracture of shaft of clavicle   . Substance abuse   . Tuberculosis     Past Surgical History  Procedure Laterality Date  . Fracture ribs     3  . Cervical spine surgery    . Right shoulder arthroscopy  2011  . Right rotator cuff  1992  . Left knee meniscus repair  1992  . Inguinal hernia repair  07/01/2012    Procedure: HERNIA REPAIR INGUINAL ADULT;  Surgeon: Madilyn Hook, DO;  Location: [facility];  Service: General;  Laterality: Right;  with Mesh  . Hernia repair      History  Substance Use Topics  . Smoking status: Former Smoker -- 43 years    Types: Cigarettes    Quit date: 07/21/2002  . Smokeless tobacco: Never Used  . Alcohol Use: No    Family History  Problem Relation Age of Onset  . Cancer Sister     breast  . Leukemia Mother   . Cancer Father     No Known Allergies  Medication list has been reviewed and updated.  Current Outpatient Prescriptions on File Prior to Visit  Medication Sig Dispense Refill  . albuterol (PROAIR HFA) 108 (90 BASE) MCG/ACT inhaler Inhale 2 puffs into the lungs every 6 (six) hours as needed for shortness of breath. 3 Inhaler 0  . dutasteride (AVODART) 0.5 MG capsule Take 0.5 mg by mouth daily.    . famotidine (PEPCID) 20 MG tablet Take 40 mg by mouth 2 (two) times daily.     Marland Kitchen  Glucosamine-Chondroit-Vit C-Mn (GLUCOSAMINE 1500 COMPLEX PO) Take 2 tablets by mouth daily with breakfast.     . Multiple Vitamin (MULTIVITAMIN) tablet Take 1 tablet by mouth daily.    . mupirocin ointment (BACTROBAN) 2 % Applied to the irritated area twice a day 22 g 3  . Omega-3 Fatty Acids (FISH OIL) 1000 MG CAPS Take 1 capsule by mouth 3 (three) times daily.     Marland Kitchen oxybutynin (DITROPAN) 5 MG tablet Take 5 mg by mouth at bedtime.    Marland Kitchen QVAR 80 MCG/ACT inhaler INHALE TWO PUFFS TWICE DAILY 8.7 g 5  . Tamsulosin HCl (FLOMAX) 0.4 MG CAPS Take 0.4 mg by mouth daily after breakfast.    . traMADol (ULTRAM) 50 MG tablet Take 1-2 tablets (50-100 mg total) by mouth 3 (three) times daily. MAXIMUM DOSE IS 6 PER DAY (Patient not taking: Reported on 06/12/2014) 42 tablet 0  . traMADol (ULTRAM) 50 MG tablet Take 1-2 tablets  (50-100 mg total) by mouth 3 (three) times daily. MAXIMUM DOSE IS 6 PER DAY (Patient not taking: Reported on 06/12/2014) 42 tablet 0  . VIAGRA 100 MG tablet TAKE 1/2-1 TABLET(S) ONE HOUR PRIOR TO INTERCOURSE (Patient not taking: Reported on 06/12/2014) 6 tablet 3   No current facility-administered medications on file prior to visit.    Review of Systems:  As per HPI, otherwise negative.    Physical Examination: Filed Vitals:   06/12/14 1135  BP: 146/70  Pulse: 67  Temp: 97.7 F (36.5 C)  Resp: 16   Filed Vitals:   06/12/14 1135  Height: 5\' 11"  (1.803 m)  Weight: 171 lb (77.565 kg)   Body mass index is 23.86 kg/(m^2). Ideal Body Weight: Weight in (lb) to have BMI = 25: 178.9  GEN: WDWN, NAD, Non-toxic, A & O x 3 HEENT: Atraumatic, Normocephalic. Neck supple. No masses, No LAD. Ears and Nose: No external deformity. CV: RRR, No M/G/R. No JVD. No thrill. No extra heart sounds. PULM: CTA B, no wheezes, crackles, rhonchi. No retractions. No resp. distress. No accessory muscle use. ABD: S, NT, ND, +BS. No rebound. No HSM. EXTR: No c/c/e NEURO Normal gait.  PSYCH: Normally interactive. Conversant. Not depressed or anxious appearing.  Calm demeanor.    Assessment and Plan: COPD biaxin  Signed,  Ellison Carwin, MD

## 2014-06-24 ENCOUNTER — Other Ambulatory Visit: Payer: Self-pay | Admitting: Emergency Medicine

## 2014-07-17 ENCOUNTER — Other Ambulatory Visit: Payer: Self-pay | Admitting: Dermatology

## 2014-07-30 ENCOUNTER — Other Ambulatory Visit: Payer: Self-pay | Admitting: Emergency Medicine

## 2014-08-04 ENCOUNTER — Ambulatory Visit (INDEPENDENT_AMBULATORY_CARE_PROVIDER_SITE_OTHER): Payer: PRIVATE HEALTH INSURANCE | Admitting: Emergency Medicine

## 2014-08-04 ENCOUNTER — Encounter: Payer: Self-pay | Admitting: Emergency Medicine

## 2014-08-04 VITALS — BP 136/86 | HR 75 | Temp 97.6°F | Resp 17 | Ht 71.0 in | Wt 170.0 lb

## 2014-08-04 DIAGNOSIS — M25511 Pain in right shoulder: Secondary | ICD-10-CM

## 2014-08-04 MED ORDER — TRIAMCINOLONE ACETONIDE 40 MG/ML IJ SUSP
40.0000 mg | Freq: Once | INTRAMUSCULAR | Status: AC
  Administered 2014-08-04: 40 mg via INTRAMUSCULAR

## 2014-08-04 NOTE — Progress Notes (Addendum)
   Subjective:    Patient ID: George Watson, male    DOB: 09/08/1945, 69 y.o.   MRN: [id number] This chart was scribed for [name], MD by Marti Sleigh, Medical Scribe. This patient was seen in Room 1 and the patient's care was started at 1:27 PM.  Chief Complaint  Patient presents with  . Shoulder Pain    HPI HPI Comments: George Watson is a 69 y.o. male with a hx of COPD, BPH and prostate cancer HTN, DM, and DDD and drug abuse who presents to [facility] complaining of right shoulder pain that started to get worse over the last 3-4 days. He has been seeing his therapist. Pt states that he believes that he injured it using a power drill. Pt states that he has seen his orthopedist and PT several times over the last several weeks, without relief of his pain. Pt states that he had a right shoulder surgery in 2011, and was told by the surgeon that his shoulder "looked like a bomb went off in there."     Review of Systems  Constitutional: Negative for fever and chills.  Musculoskeletal: Positive for back pain and arthralgias.       Objective:   Physical Exam  Constitutional: He is oriented to person, place, and time. He appears well-developed and well-nourished.  HENT:  Head: Normocephalic and atraumatic.  Eyes: Pupils are equal, round, and reactive to light.  Neck: Neck supple.  Cardiovascular: Normal rate and regular rhythm.   Pulmonary/Chest: Effort normal and breath sounds normal. No respiratory distress.  Neurological: He is alert and oriented to person, place, and time.  Skin: Skin is warm and dry.  Psychiatric: He has a normal mood and affect. His behavior is normal.  Nursing note reviewed. EXT there is crepitation around the right shoulder. There is pain with abduction of the right shoulder. There is no focal weakness noted. Deep tendon reflexes are 2+. Procedure note. The subdeltoid area was marked and then cleaned with Betadine. Alchohol  swab removed the Betadine. 1 mL of 2%  plain was injected. Following this 40 of Kenalog with 2 mL of lidocaine was injected in the subdeltoid area. Patient tolerated the procedure well without difficulty.    Assessment & Plan:  Patient to start some gentle exercises. He is to continue to apply to the area. He will use ibuprofen and Tylenol for pain.I personally performed the services described in this documentation, which was scribed in my presence. The recorded information has been reviewed and is accurate.

## 2014-08-22 ENCOUNTER — Ambulatory Visit (INDEPENDENT_AMBULATORY_CARE_PROVIDER_SITE_OTHER): Payer: PRIVATE HEALTH INSURANCE | Admitting: Emergency Medicine

## 2014-08-22 ENCOUNTER — Encounter: Payer: Self-pay | Admitting: Emergency Medicine

## 2014-08-22 VITALS — BP 149/87 | HR 68 | Temp 97.5°F | Resp 16 | Ht 70.5 in | Wt 169.0 lb

## 2014-08-22 DIAGNOSIS — E119 Type 2 diabetes mellitus without complications: Secondary | ICD-10-CM

## 2014-08-22 DIAGNOSIS — Z0000 Encounter for general adult medical examination without abnormal findings: Secondary | ICD-10-CM | POA: Diagnosis not present

## 2014-08-22 DIAGNOSIS — E7800 Pure hypercholesterolemia, unspecified: Secondary | ICD-10-CM

## 2014-08-22 DIAGNOSIS — I10 Essential (primary) hypertension: Secondary | ICD-10-CM | POA: Diagnosis not present

## 2014-08-22 DIAGNOSIS — M25511 Pain in right shoulder: Secondary | ICD-10-CM | POA: Diagnosis not present

## 2014-08-22 DIAGNOSIS — Z23 Encounter for immunization: Secondary | ICD-10-CM

## 2014-08-22 DIAGNOSIS — C61 Malignant neoplasm of prostate: Secondary | ICD-10-CM | POA: Diagnosis not present

## 2014-08-22 LAB — CBC WITH DIFFERENTIAL/PLATELET
Basophils Absolute: 0.1 10*3/uL (ref 0.0–0.1)
Basophils Relative: 1 % (ref 0–1)
EOS PCT: 5 % (ref 0–5)
Eosinophils Absolute: 0.5 10*3/uL (ref 0.0–0.7)
HCT: 46.5 % (ref 39.0–52.0)
Hemoglobin: 15.8 g/dL (ref 13.0–17.0)
LYMPHS ABS: 2.9 10*3/uL (ref 0.7–4.0)
Lymphocytes Relative: 32 % (ref 12–46)
MCH: 30.5 pg (ref 26.0–34.0)
MCHC: 34 g/dL (ref 30.0–36.0)
MCV: 89.8 fL (ref 78.0–100.0)
MONO ABS: 1.3 10*3/uL — AB (ref 0.1–1.0)
MONOS PCT: 14 % — AB (ref 3–12)
MPV: 9.8 fL (ref 8.6–12.4)
NEUTROS ABS: 4.4 10*3/uL (ref 1.7–7.7)
Neutrophils Relative %: 48 % (ref 43–77)
Platelets: 268 10*3/uL (ref 150–400)
RBC: 5.18 MIL/uL (ref 4.22–5.81)
RDW: 13.5 % (ref 11.5–15.5)
WBC: 9.2 10*3/uL (ref 4.0–10.5)

## 2014-08-22 LAB — COMPLETE METABOLIC PANEL WITH GFR
ALT: 23 U/L (ref 0–53)
AST: 21 U/L (ref 0–37)
Albumin: 4.5 g/dL (ref 3.5–5.2)
Alkaline Phosphatase: 48 U/L (ref 39–117)
BILIRUBIN TOTAL: 0.7 mg/dL (ref 0.2–1.2)
BUN: 23 mg/dL (ref 6–23)
CALCIUM: 10 mg/dL (ref 8.4–10.5)
CHLORIDE: 103 meq/L (ref 96–112)
CO2: 24 mEq/L (ref 19–32)
Creat: 0.76 mg/dL (ref 0.50–1.35)
GFR, Est Non African American: >89 mL/min
Glucose, Bld: 84 mg/dL (ref 70–99)
POTASSIUM: 4.2 meq/L (ref 3.5–5.3)
Sodium: 139 mEq/L (ref 135–145)
Total Protein: 7.6 g/dL (ref 6.0–8.3)

## 2014-08-22 LAB — POCT GLYCOSYLATED HEMOGLOBIN (HGB A1C): Hemoglobin A1C: 5.8

## 2014-08-22 LAB — GLUCOSE, POCT (MANUAL RESULT ENTRY): POC Glucose: 59 mg/dl — AB (ref 70–99)

## 2014-08-22 LAB — LIPID PANEL
Cholesterol: 231 mg/dL — ABNORMAL HIGH (ref 0–200)
HDL: 35 mg/dL — ABNORMAL LOW (ref >39)
LDL Cholesterol: 159 mg/dL — ABNORMAL HIGH (ref 0–99)
Total CHOL/HDL Ratio: 6.6 Ratio
Triglycerides: 186 mg/dL — ABNORMAL HIGH (ref <150)
VLDL: 37 mg/dL (ref 0–40)

## 2014-08-22 NOTE — Progress Notes (Addendum)
   Subjective:    Patient ID: George Watson, male    DOB: 1945/08/12, 69 y.o.   MRN: [id number] This chart was scribed for [name], MD by Zola Button, Medical Scribe. This patient was seen in room 22 and the patient's care was started at 4:26 PM.   HPI HPI Comments: George Watson is a 69 y.o. male with a hx of hepatitis C, DM, HSV-2, prostate cancer and DDD who presents to the [facility] for a complete physical exam. Patient would like a referral to another urologist. He has been having neck stiffness and neck pain.  He also still has ongoing pain his right shoulder shoulder. Patient states that the cortisone shot given by me last visit 1 month ago did provide relief to his shoulder pain, but his pain has returned. He does have hx of rotator cuff surgery. Patient sees an eye doctor at least once a year. He has not had the shingles vaccine. He had an outbreak of shingles in 2007.  Patient has an upcoming trip to [place] on March 11 that was rescheduled due to inclement weather. He quit smoking 12 years ago.    Review of Systems  Musculoskeletal: Positive for arthralgias, neck pain and neck stiffness.       Objective:   Physical Exam CONSTITUTIONAL: Well developed/well nourished HEAD: Normocephalic/atraumatic EYES: EOM/PERRL ENMT: Mucous membranes moist NECK: supple no meningeal signs SPINE: entire spine nontender CV: S1/S2 noted, no murmurs/rubs/gallops noted LUNGS: Lungs are clear to auscultation bilaterally, no apparent distress ABDOMEN: soft, nontender, no rebound or guarding GU: no cva tenderness NEURO: Pt is awake/alert, moves all extremitiesx4 EXTREMITIES: pulses normal, full ROM SKIN: warm, color normal PSYCH: no abnormalities of mood noted  Results for orders placed or performed in visit on 08/22/14  POCT glucose (manual entry)  Result Value Ref Range   POC Glucose 59 (A) 70 - 99 mg/dl  POCT glycosylated hemoglobin (Hb A1C)  Result Value Ref  Range   Hemoglobin A1C 5.8        Assessment & Plan:  Diabetes is under great control on no medications. Referral made for physical therapy to help with his neck and shoulder discomfort. Referral made to Dr. Burnell Blanks for second opinion regarding his prostate cancer.I personally performed the services described in this documentation, which was scribed in my presence. The recorded information has been reviewed and is accurate.

## 2014-08-28 ENCOUNTER — Encounter: Payer: Self-pay | Admitting: Radiology

## 2014-09-01 ENCOUNTER — Telehealth: Payer: Self-pay | Admitting: *Deleted

## 2014-09-01 NOTE — Telephone Encounter (Signed)
Received a call from Dr Harlow Asa office regarding pt's appt for today.   The office stated the pt walked out when told it would take at least 20 mins to fill out the new patient paperwork.  They stated he came at 2:05 and walked out at 2:10 pm.  They just wanted [place] to be aware of the situation.

## 2014-09-21 ENCOUNTER — Ambulatory Visit (INDEPENDENT_AMBULATORY_CARE_PROVIDER_SITE_OTHER): Payer: PRIVATE HEALTH INSURANCE

## 2014-09-21 ENCOUNTER — Ambulatory Visit (INDEPENDENT_AMBULATORY_CARE_PROVIDER_SITE_OTHER): Payer: PRIVATE HEALTH INSURANCE | Admitting: Emergency Medicine

## 2014-09-21 ENCOUNTER — Encounter: Payer: Self-pay | Admitting: Emergency Medicine

## 2014-09-21 VITALS — BP 132/74 | HR 62 | Temp 97.5°F | Resp 16 | Ht 71.25 in | Wt 168.6 lb

## 2014-09-21 DIAGNOSIS — M542 Cervicalgia: Secondary | ICD-10-CM | POA: Diagnosis not present

## 2014-09-21 DIAGNOSIS — M25511 Pain in right shoulder: Secondary | ICD-10-CM | POA: Diagnosis not present

## 2014-09-21 MED ORDER — TRIAMCINOLONE ACETONIDE 40 MG/ML IJ SUSP
40.0000 mg | Freq: Once | INTRAMUSCULAR | Status: AC
  Administered 2014-09-21: 40 mg via INTRA_ARTICULAR

## 2014-09-21 MED ORDER — TRIAMCINOLONE ACETONIDE 40 MG/ML IJ SUSP: 40.0000 mg | Freq: Once | INTRAMUSCULAR | Status: DC

## 2014-09-21 NOTE — Progress Notes (Deleted)
   Subjective:    Patient ID: George Watson, male    DOB: Dec 05, 1945, 69 y.o.   MRN: [id number]  HPI    Review of Systems     Objective:   Physical Exam        Assessment & Plan:

## 2014-09-21 NOTE — Progress Notes (Signed)
Subjective:  This chart was scribed for Darlyne Russian, MD by Tamsen Roers, at [facility].  This patient was seen in room 23 and the patient's care was started at 10:28 AM.    Patient ID: George Watson, male    DOB: 1946/04/07, 69 y.o.   MRN: [id number]  HPI  HPI Comments: George Watson is a 69 y.o. male who presents to [facility] for a follow up today. Patient states that he is having constant shoulder pain/neck pain and has been going to physical therapy twice a week Patient notes that he is not able to lift his arm over his head.  Dr. Sharol Given (treats his shoulder) Dr. Arnoldo Morale( treats his neck).  Patient sees a Restaurant manager, fast food (Dr. Claudie Fisherman) and gets a message once a month.  Patient tried acupuncture on his lower back but states that it did not help.  Patient had a cortisone shot on January 21st for his shoulder and states that the shot also helped with his neck pain.  He also went to physical therapy and re-injured his shoulder from using the wrong band. Patient is leaving for vacation next Thursday and is wondering if he can have another shot if his pain persists.  Patient did not have any trouble getting his Hepatitis C medication approved.      Patient Active Problem List   Diagnosis Date Noted  . Degeneration of lumbar or lumbosacral intervertebral disc 02/15/2014  . Diabetes 05/18/2012  . Ureteral calculus, right 02/02/2012  . Essential hypertension, benign   . H/O drug abuse   . Other and unspecified hyperlipidemia   . HSV-2 (herpes simplex virus 2) infection   . Elevated PSA   . COPD (chronic obstructive pulmonary disease)   . BPH (benign prostatic hypertrophy)   . DDD (degenerative disc disease), lumbosacral   . Prostate cancer   . Hepatitis C 10/19/1996   Past Medical History  Diagnosis Date  . Hepatitis C 10/1996  . H/O drug abuse     multisubstance  . Other and unspecified hyperlipidemia   . HSV-2 (herpes simplex virus 2)  infection   . Elevated PSA   . COPD (chronic obstructive pulmonary disease)   . BPH (benign prostatic hypertrophy)   . Inguinal hernia     right  . Hx of biopsy 08/2004    liver  . DDD (degenerative disc disease), lumbosacral   . Essential hypertension, benign     diet controlled  . GERD (gastroesophageal reflux disease)   . Prostate cancer   . Fracture of shaft of clavicle   . Substance abuse   . Tuberculosis    Past Surgical History  Procedure Laterality Date  . Fracture ribs      3  . Cervical spine surgery    . Right shoulder arthroscopy  2011  . Right rotator cuff  1992  . Left knee meniscus repair  1992  . Inguinal hernia repair  07/01/2012    Procedure: HERNIA REPAIR INGUINAL ADULT;  Surgeon: Madilyn Hook, DO;  Location: [facility];  Service: General;  Laterality: Right;  with Mesh  . Hernia repair     No Known Allergies Prior to Admission medications   Medication Sig Start Date End Date Taking? Authorizing Provider  albuterol (PROAIR HFA) 108 (90 BASE) MCG/ACT inhaler Inhale 2 puffs into the lungs every 6 (six) hours as needed for shortness of breath. 08/26/13   Darlyne Russian, MD  dutasteride (AVODART) 0.5  MG capsule Take 0.5 mg by mouth daily.    Historical Provider, MD  famotidine (PEPCID) 20 MG tablet Take 40 mg by mouth 2 (two) times daily.     Historical Provider, MD  Glucosamine-Chondroit-Vit C-Mn (GLUCOSAMINE 1500 COMPLEX PO) Take 2 tablets by mouth daily with breakfast.     Historical Provider, MD  metaxalone (SKELAXIN) 800 MG tablet TAKE 1 TABLET (800 MG TOTAL) BY MOUTH 3 (THREE) TIMES DAILY AS NEEDEDFOR BACK PAIN. 07/31/14   Chelle Janalee Dane, PA-C  Multiple Vitamin (MULTIVITAMIN) tablet Take 1 tablet by mouth daily.    Historical Provider, MD  mupirocin ointment (BACTROBAN) 2 % Applied to the irritated area twice a day 01/24/14   Darlyne Russian, MD  Omega-3 Fatty Acids (FISH OIL) 1000 MG CAPS Take 1 capsule by mouth 3 (three) times daily.     Historical Provider, MD    oxybutynin (DITROPAN) 5 MG tablet Take 5 mg by mouth at bedtime.    Historical Provider, MD  QVAR 80 MCG/ACT inhaler INHALE TWO PUFFS TWICE DAILY AS DIRECTED 06/25/14   Roselee Culver, MD  Tamsulosin HCl (FLOMAX) 0.4 MG CAPS Take 0.4 mg by mouth daily after breakfast.    Historical Provider, MD   History   Social History  . Marital Status: Married    Spouse Name: N/A  . Number of Children: N/A  . Years of Education: N/A   Occupational History  . Not on file.   Social History Main Topics  . Smoking status: Former Smoker -- 43 years    Types: Cigarettes    Quit date: 07/21/2002  . Smokeless tobacco: Never Used  . Alcohol Use: No  . Drug Use: No     Comment: 26 years ago cocaine, heroin. methadone  . Sexual Activity: Yes     Comment: number of sex partners in the last 70 months  1   Other Topics Concern  . Not on file   Social History Narrative   Married to [employer]. Exercise cardio daily for 30 minutes. Education: [employer].   Review of Systems  Constitutional: Negative for fever and chills.  HENT: Negative for congestion, drooling and nosebleeds.   Respiratory: Negative for chest tightness and shortness of breath.   Musculoskeletal: Positive for myalgias and neck pain.       Objective:   Physical Exam CONSTITUTIONAL: Well developed/well nourished HEAD: Normocephalic/atraumatic EYES: EOMI/PERRL ENMT: Mucous membranes moist NECK: supple no meningeal signs, limited ROM of the neck SPINE/BACK:entire spine nontender CV: S1/S2 noted, no murmurs/rubs/gallops noted LUNGS: Lungs are clear to auscultation bilaterally, no apparent distress ABDOMEN: soft, nontender, no rebound or guarding, bowel sounds noted throughout abdomen GU:no cva tenderness NEURO: Pt is awake/alert/appropriate, moves all extremitiesx4.  No facial droop.   EXTREMITIES: pulses normal/equal, Wasting around the right shoulder with weak external rotation and abduction.  Limited flexion/extension.   SKIN: warm, color normal PSYCH: no abnormalities of mood noted, alert and oriented to situation  Results for orders placed or performed in visit on 08/22/14  CBC with Differential/Platelet  Result Value Ref Range   WBC 9.2 4.0 - 10.5 K/uL   RBC 5.18 4.22 - 5.81 MIL/uL   Hemoglobin 15.8 13.0 - 17.0 g/dL   HCT 46.5 39.0 - 52.0 %   MCV 89.8 78.0 - 100.0 fL   MCH 30.5 26.0 - 34.0 pg   MCHC 34.0 30.0 - 36.0 g/dL   RDW 13.5 11.5 - 15.5 %   Platelets 268 150 - 400 K/uL  MPV 9.8 8.6 - 12.4 fL   Neutrophils Relative % 48 43 - 77 %   Neutro Abs 4.4 1.7 - 7.7 K/uL   Lymphocytes Relative 32 12 - 46 %   Lymphs Abs 2.9 0.7 - 4.0 K/uL   Monocytes Relative 14 (H) 3 - 12 %   Monocytes Absolute 1.3 (H) 0.1 - 1.0 K/uL   Eosinophils Relative 5 0 - 5 %   Eosinophils Absolute 0.5 0.0 - 0.7 K/uL   Basophils Relative 1 0 - 1 %   Basophils Absolute 0.1 0.0 - 0.1 K/uL   Smear Review Criteria for review not met   COMPLETE METABOLIC PANEL WITH GFR  Result Value Ref Range   Sodium 139 135 - 145 mEq/L   Potassium 4.2 3.5 - 5.3 mEq/L   Chloride 103 96 - 112 mEq/L   CO2 24 19 - 32 mEq/L   Glucose, Bld 84 70 - 99 mg/dL   BUN 23 6 - 23 mg/dL   Creat 0.76 0.50 - 1.35 mg/dL   Total Bilirubin 0.7 0.2 - 1.2 mg/dL   Alkaline Phosphatase 48 39 - 117 U/L   AST 21 0 - 37 U/L   ALT 23 0 - 53 U/L   Total Protein 7.6 6.0 - 8.3 g/dL   Albumin 4.5 3.5 - 5.2 g/dL   Calcium 10.0 8.4 - 10.5 mg/dL   GFR, Est African American >89 mL/min   GFR, Est Non African American >89 mL/min  Lipid panel  Result Value Ref Range   Cholesterol 231 (H) 0 - 200 mg/dL   Triglycerides 186 (H) <150 mg/dL   HDL 35 (L) >39 mg/dL   Total CHOL/HDL Ratio 6.6 Ratio   VLDL 37 0 - 40 mg/dL   LDL Cholesterol 159 (H) 0 - 99 mg/dL  POCT glucose (manual entry)  Result Value Ref Range   POC Glucose 59 (A) 70 - 99 mg/dl  POCT glycosylated hemoglobin (Hb A1C)  Result Value Ref Range   Hemoglobin A1C 5.8      Filed Vitals:   09/21/14  1013  BP: 132/74  Pulse: 62  Temp: 97.5 F (36.4 C)  TempSrc: Oral  Resp: 16  Height: 5' 11.25" (1.81 m)  Weight: 168 lb 9.6 oz (76.476 kg)  SpO2: 98%   PROCEDURE NOTE : The subdeltoid area was prepped with Betadine 2. Alcohol swab was used. 1 mL of 2% plain was injected. Following this 40 of Kenalog with 2 mL of 2% plain was injected. Patient tolerated the procedure well. Procedure performed on the right shoulder [facility] reading (PRIMARY) by  Dr. Everlene Farrier there is a healed fusion site. There is mild arthritic changes no acute problems seen.       Assessment & Plan:  Right shoulder was injected again. He had a good response previously. Hopefully he will have good response again. Neck withheld for done and showed a healed fusion site. No change in medications.I personally performed the services described in this documentation, which was scribed in my presence. The recorded information has been reviewed and is accurate.

## 2014-09-25 ENCOUNTER — Other Ambulatory Visit: Payer: Self-pay | Admitting: Physician Assistant

## 2014-10-12 ENCOUNTER — Other Ambulatory Visit: Payer: Self-pay | Admitting: Emergency Medicine

## 2014-10-12 DIAGNOSIS — M25519 Pain in unspecified shoulder: Secondary | ICD-10-CM

## 2014-10-20 ENCOUNTER — Telehealth: Payer: Self-pay

## 2014-10-20 NOTE — Telephone Encounter (Signed)
Per April, patient requests x-ray disc. Please call when ready for pickup.

## 2014-10-20 NOTE — Telephone Encounter (Signed)
Request printed and forwarded to x-ray. °

## 2014-11-14 ENCOUNTER — Other Ambulatory Visit: Payer: Self-pay | Admitting: Gastroenterology

## 2014-11-14 DIAGNOSIS — B182 Chronic viral hepatitis C: Secondary | ICD-10-CM

## 2014-11-20 ENCOUNTER — Ambulatory Visit
Admission: RE | Admit: 2014-11-20 | Discharge: 2014-11-20 | Disposition: A | Discharge status: Home / Self Care | Payer: PRIVATE HEALTH INSURANCE | Source: Ambulatory Visit | Attending: Gastroenterology | Admitting: Gastroenterology

## 2014-11-20 DIAGNOSIS — B182 Chronic viral hepatitis C: Secondary | ICD-10-CM

## 2014-11-26 ENCOUNTER — Ambulatory Visit (INDEPENDENT_AMBULATORY_CARE_PROVIDER_SITE_OTHER): Payer: PRIVATE HEALTH INSURANCE | Admitting: Emergency Medicine

## 2014-11-26 ENCOUNTER — Encounter: Payer: Self-pay | Admitting: Emergency Medicine

## 2014-11-26 VITALS — BP 136/74 | HR 74 | Temp 98.0°F | Resp 16 | Ht 71.5 in | Wt 172.4 lb

## 2014-11-26 DIAGNOSIS — R059 Cough, unspecified: Secondary | ICD-10-CM

## 2014-11-26 DIAGNOSIS — J441 Chronic obstructive pulmonary disease with (acute) exacerbation: Secondary | ICD-10-CM | POA: Diagnosis not present

## 2014-11-26 DIAGNOSIS — R05 Cough: Secondary | ICD-10-CM | POA: Diagnosis not present

## 2014-11-26 MED ORDER — PREDNISONE 10 MG PO TABS: ORAL_TABLET | ORAL | Status: DC

## 2014-11-26 MED ORDER — BENZONATATE 100 MG PO CAPS: 100.0000 mg | ORAL_CAPSULE | Freq: Three times a day (TID) | ORAL | Status: DC | PRN

## 2014-11-26 MED ORDER — ALBUTEROL SULFATE (2.5 MG/3ML) 0.083% IN NEBU
2.5000 mg | INHALATION_SOLUTION | Freq: Once | RESPIRATORY_TRACT | Status: AC
  Administered 2014-11-26: 2.5 mg via RESPIRATORY_TRACT

## 2014-11-26 MED ORDER — IPRATROPIUM BROMIDE 0.02 % IN SOLN
0.5000 mg | Freq: Once | RESPIRATORY_TRACT | Status: AC
  Administered 2014-11-26: 0.5 mg via RESPIRATORY_TRACT

## 2014-11-26 NOTE — Patient Instructions (Signed)
Cough, Adult  A cough is a reflex that helps clear your throat and airways. It can help heal the body or may be a reaction to an irritated airway. A cough may only last 2 or 3 weeks (acute) or may last more than 8 weeks (chronic).  CAUSES Acute cough:  Viral or bacterial infections. Chronic cough:  Infections.  Allergies.  Asthma.  Post-nasal drip.  Smoking.  Heartburn or acid reflux.  Some medicines.  Chronic lung problems (COPD).  Cancer. SYMPTOMS   Cough.  Fever.  Chest pain.  Increased breathing rate.  High-pitched whistling sound when breathing (wheezing).  Colored mucus that you cough up (sputum). TREATMENT   A bacterial cough may be treated with antibiotic medicine.  A viral cough must run its course and will not respond to antibiotics.  Your caregiver may recommend other treatments if you have a chronic cough. HOME CARE INSTRUCTIONS   Only take over-the-counter or prescription medicines for pain, discomfort, or fever as directed by your caregiver. Use cough suppressants only as directed by your caregiver.  Use a cold steam vaporizer or humidifier in your bedroom or home to help loosen secretions.  Sleep in a semi-upright position if your cough is worse at night.  Rest as needed.  Stop smoking if you smoke. SEEK IMMEDIATE MEDICAL CARE IF:   You have pus in your sputum.  Your cough starts to worsen.  You cannot control your cough with suppressants and are losing sleep.  You begin coughing up blood.  You have difficulty breathing.  You develop pain which is getting worse or is uncontrolled with medicine.  You have a fever. MAKE SURE YOU:   Understand these instructions.  Will watch your condition.  Will get help right away if you are not doing well or get worse. Document Released: 01/03/2011 Document Revised: 09/29/2011 Document Reviewed: 01/03/2011 ExitCare Patient Information 2015 [employer]. This information is not intended  to replace advice given to you by your health care provider. Make sure you discuss any questions you have with your health care provider.  

## 2014-11-26 NOTE — Progress Notes (Signed)
   Subjective:  This chart was scribed for [name], MD by Moises Blood, Medical Scribe. This patient was seen in room 4 and the patient's care was started 1:36 PM.   Patient ID: George Watson, male    DOB: [birth year], 69 y.o.   MRN: [id number]  HPI George Watson is a 69 y.o. male who presents to [facility] complaining of shortness of breath starting in January with worsening 9 days ago and the onset of associated symptoms cough productive of brown sputum worse in the supine position.  He reports chest congestion initially that has resolved but recent onset of sinus congestion.  He states of taking Delsym for his current complaints and has also used his prescribed Q-var and infrequently Pro-air.  He has used Spiriva in the past without any noticeable benefit.  He denies fever.  He stopped smoking 12 years ago.   He is undergoing physical therapy for shoulder and is followed Dr. Onnie Graham.   He recently changed doctors and [facility] and is now be seen by Dr. Minus Liberty.  He has been married for 20 years.       Review of Systems  Constitutional: Negative for fever.  HENT: Positive for congestion and sore throat.   Respiratory: Positive for cough and shortness of breath.        Objective:   Physical Exam  Nursing note and vitals reviewed.   CONSTITUTIONAL: Well developed/Wel nourished HEAD: Normocephalic/atraumatic EYES: EOMI/PERRL ENMT: Mucous membranes moist NECK: supple no meningeal signs SPINE/BACK: entire spine nontender CV: S1/S2 noted, no murmurs/rubs/gallops noted LUNGS: Lungs are clear to auscultation bilaterally, no apparent distress ABDOMEN: soft, non tender, no rebound or guarding, bowel sounds noted throughout abdomen GU: no cva tenderness NEURO: Pt is awake/alert/appropriate, moves all extremities x4. No facial droop. EXTREMITIES: pulses normal/equal, full ROM SKIN: warm, color normal PSYCH: no abnormalities of mood noted, alert, and oriented to  situation       Assessment & Plan:  Patient did improve posttreatment. We'll treat with prednisone taper Tessalon Perles. He will increase his use of his albuterol inhaler.I personally performed the services described in this documentation, which was scribed in my presence. The recorded information has been reviewed and is accurate.  Nena Jordan, MD

## 2014-12-25 ENCOUNTER — Other Ambulatory Visit (HOSPITAL_COMMUNITY): Payer: Self-pay | Admitting: Urology

## 2014-12-25 DIAGNOSIS — C61 Malignant neoplasm of prostate: Secondary | ICD-10-CM

## 2015-01-03 ENCOUNTER — Ambulatory Visit (INDEPENDENT_AMBULATORY_CARE_PROVIDER_SITE_OTHER): Payer: PRIVATE HEALTH INSURANCE | Admitting: Emergency Medicine

## 2015-01-03 VITALS — BP 122/80 | HR 86 | Temp 98.5°F | Resp 17 | Ht 71.5 in | Wt 167.4 lb

## 2015-01-03 DIAGNOSIS — Z23 Encounter for immunization: Secondary | ICD-10-CM

## 2015-01-03 DIAGNOSIS — T148 Other injury of unspecified body region: Secondary | ICD-10-CM

## 2015-01-03 DIAGNOSIS — T148XXA Other injury of unspecified body region, initial encounter: Secondary | ICD-10-CM

## 2015-01-03 DIAGNOSIS — W5581XA Bitten by other mammals, initial encounter: Secondary | ICD-10-CM

## 2015-01-03 NOTE — Progress Notes (Addendum)
   Subjective:  This chart was scribed for [name], MD by Moises Blood, Medical Scribe. This patient was seen in Room 3 and the patient's care was started 12:42 PM.    Patient ID: George Watson, male    DOB: May 26, 1946, 69 y.o.   MRN: [id number]  HPI George Watson is a 69 y.o. male who presents to [facility] complaining of sudden onset dog bite on the right leg this morning.  Pt was doing "meals on wheels" and went to a lady's house for delivery. The lady's chihuahua jumped up and nipped him on the right leg.     Review of Systems  Constitutional: Negative for fever.  Skin: Positive for wound (dog bite on right leg).       Objective:   Physical Exam CONSTITUTIONAL: Well developed/Wel nourished HEAD: Normocephalic/atraumatic EYES: EOMI/PERRL ENMT: Mucous membranes moist NECK: supple no meningeal signs SPINE/BACK: entire spine nontender CV: S1/S2 noted, no murmurs/rubs/gallops noted LUNGS: Lungs are clear to auscultation bilaterally, no apparent distress ABDOMEN: soft, non tender, no rebound or guarding, bowel sounds noted throughout abdomen GU: no cva tenderness NEURO: Pt is awake/alert/appropriate, moves all extremities x4. No facial droop. EXTREMITIES: pulses normal/equal, full ROM SKIN: warm, color normal PSYCH: no abnormalities of mood noted, alert, and oriented to situation        Assessment & Plan:  Patient has a dog bite right leg. It is very superficial. He will keep this clean with soap and water. Dog bite reported to animal control. He had not had tetanus immunization for 8-9 years. He was given a T dap. when I later reviewed the records it showed he had had a T dap previously in 2007 patient was advised of this. I personally performed the services described in this documentation, which was scribed in my presence. The recorded information has been reviewed and is accurate.  Nena Jordan, MD.

## 2015-01-08 ENCOUNTER — Telehealth: Payer: Self-pay

## 2015-01-08 ENCOUNTER — Ambulatory Visit (INDEPENDENT_AMBULATORY_CARE_PROVIDER_SITE_OTHER): Payer: PRIVATE HEALTH INSURANCE

## 2015-01-08 ENCOUNTER — Ambulatory Visit (INDEPENDENT_AMBULATORY_CARE_PROVIDER_SITE_OTHER): Payer: PRIVATE HEALTH INSURANCE | Admitting: Physician Assistant

## 2015-01-08 ENCOUNTER — Ambulatory Visit (HOSPITAL_COMMUNITY): Payer: PRIVATE HEALTH INSURANCE

## 2015-01-08 VITALS — BP 126/78 | HR 78 | Temp 98.9°F | Resp 17 | Ht 71.0 in | Wt 166.0 lb

## 2015-01-08 DIAGNOSIS — J441 Chronic obstructive pulmonary disease with (acute) exacerbation: Secondary | ICD-10-CM | POA: Diagnosis not present

## 2015-01-08 DIAGNOSIS — R0602 Shortness of breath: Secondary | ICD-10-CM

## 2015-01-08 DIAGNOSIS — Z8709 Personal history of other diseases of the respiratory system: Secondary | ICD-10-CM | POA: Diagnosis not present

## 2015-01-08 DIAGNOSIS — R062 Wheezing: Secondary | ICD-10-CM

## 2015-01-08 LAB — POCT CBC
GRANULOCYTE PERCENT: 66 % (ref 37–80)
HEMATOCRIT: 49.3 % (ref 43.5–53.7)
Hemoglobin: 16.5 g/dL (ref 14.1–18.1)
LYMPH, POC: 1.7 (ref 0.6–3.4)
MCH, POC: 30 pg (ref 27–31.2)
MCHC: 33.5 g/dL (ref 31.8–35.4)
MCV: 89.6 fL (ref 80–97)
MID (CBC): 1.3 — AB (ref 0–0.9)
MPV: 7.3 fL (ref 0–99.8)
PLATELET COUNT, POC: 241 10*3/uL (ref 142–424)
POC Granulocyte: 5.8 (ref 2–6.9)
POC LYMPH %: 19.3 % (ref 10–50)
POC MID %: 14.7 %M — AB (ref 0–12)
RBC: 5.51 M/uL (ref 4.69–6.13)
RDW, POC: 12.9 %
WBC: 8.8 10*3/uL (ref 4.6–10.2)

## 2015-01-08 LAB — PULMONARY FUNCTION TEST

## 2015-01-08 MED ORDER — ALBUTEROL SULFATE (2.5 MG/3ML) 0.083% IN NEBU
2.5000 mg | INHALATION_SOLUTION | Freq: Once | RESPIRATORY_TRACT | Status: AC
  Administered 2015-01-08: 2.5 mg via RESPIRATORY_TRACT

## 2015-01-08 MED ORDER — IPRATROPIUM BROMIDE 0.02 % IN SOLN
0.5000 mg | Freq: Once | RESPIRATORY_TRACT | Status: AC
  Administered 2015-01-08: 0.5 mg via RESPIRATORY_TRACT

## 2015-01-08 MED ORDER — AZITHROMYCIN 500 MG PO TABS: ORAL_TABLET | ORAL | Status: DC

## 2015-01-08 MED ORDER — PREDNISONE 20 MG PO TABS: ORAL_TABLET | ORAL | Status: DC

## 2015-01-08 NOTE — Telephone Encounter (Signed)
PT WAS SEEN 01/03/15 FOR A DOG BITE. PT STATES HE FILLED OUT A DOG BITE FORM, HOWEVER THE FORM NEVER REACHED THE [place] [employer] [employer]. CAN WE PLEASE LOCATED THIS FORM AND RE-FAX IT TO THEM?

## 2015-01-08 NOTE — Progress Notes (Signed)
01/08/2015 at 10:13 AM  George Watson / DOB: 1945/08/12 / MRN: [id number]  The patient has Hepatitis C; Essential hypertension, benign; H/O drug abuse; Other and unspecified hyperlipidemia; HSV-2 (herpes simplex virus 2) infection; Elevated PSA; COPD (chronic obstructive pulmonary disease); BPH (benign prostatic hypertrophy); DDD (degenerative disc disease), lumbosacral; Prostate cancer; Ureteral calculus, right; Diabetes; and Degeneration of lumbar or lumbosacral intervertebral disc on his problem list.  SUBJECTIVE  Chief complaint: Cough and URI   Patient here for SOB that has been worsening of the last 4 days.  Reports a history of COPD with a 60 pack year history of smoking.  Associates some wheezing with this episode.  Denies chest pain, palpitations and diaphoresis.  He was able to exercise and cut grass yesterday, however, his breathing was more labored.  He had a work up with Dr. Nadyne Coombes 2 years ago and was told that his heart was healthy.  He had another exacerbation roughly 6 weeks ago and required prednisone for this.  His COPD is managed with QVAR and proair. He saw a pulmonologist in 2002 for pneumonia and was diagnosed with COPD at that time.    He  has a past medical history of Hepatitis C (10/1996); H/O drug abuse; Other and unspecified hyperlipidemia; HSV-2 (herpes simplex virus 2) infection; Elevated PSA; COPD (chronic obstructive pulmonary disease); BPH (benign prostatic hypertrophy); Inguinal hernia; biopsy (08/2004); DDD (degenerative disc disease), lumbosacral; Essential hypertension, benign; GERD (gastroesophageal reflux disease); Prostate cancer; Fracture of shaft of clavicle; Substance abuse; and Tuberculosis.    Medications reviewed and updated by myself where necessary, and exist elsewhere in the encounter.   George Watson has No Known Allergies. He  reports that he quit smoking about 12 years ago. His smoking use included Cigarettes. He quit after 43 years of use. He has  never used smokeless tobacco. He reports that he does not drink alcohol or use illicit drugs. He  reports that he currently engages in sexual activity. The patient  has past surgical history that includes fracture ribs; Cervical spine surgery; right shoulder arthroscopy (2011); right rotator cuff (1992); left knee meniscus repair (1992); Inguinal hernia repair (07/01/2012); and Hernia repair.  His family history includes Cancer in his father and sister; Leukemia in his mother.  Review of Systems  Constitutional: Negative for fever.  Respiratory: Positive for cough and shortness of breath. Negative for hemoptysis, sputum production and wheezing.   Cardiovascular: Negative for chest pain.  Gastrointestinal: Negative for nausea.  Genitourinary: Negative.   Skin: Negative for rash.  Neurological: Negative for dizziness and headaches.    OBJECTIVE  His  height is 5\' 11"  (1.803 m) and weight is 166 lb (75.297 kg). His oral temperature is 98.9 F (37.2 C). His blood pressure is 126/78 and his pulse is 78. His respiration is 17 and oxygen saturation is 97%.  The patient's body mass index is 23.16 kg/(m^2).  Physical Exam  Constitutional: He is oriented to person, place, and time. He appears well-developed and well-nourished. No distress.  HENT:  Right Ear: Hearing, tympanic membrane, external ear and ear canal normal.  Left Ear: Hearing, tympanic membrane, external ear and ear canal normal.  Nose: Mucosal edema present. No sinus tenderness. Right sinus exhibits no maxillary sinus tenderness and no frontal sinus tenderness. Left sinus exhibits no maxillary sinus tenderness and no frontal sinus tenderness.  Mouth/Throat: Uvula is midline and mucous membranes are normal. Mucous membranes are not pale, not dry and not cyanotic. Posterior oropharyngeal erythema present. No  oropharyngeal exudate, posterior oropharyngeal edema or tonsillar abscesses.  Cardiovascular: Normal rate and regular rhythm.     Respiratory: Effort normal. He has wheezes (Left upper lobe. ). He has no rales.  Musculoskeletal: Normal range of motion.  Lymphadenopathy:    He has no cervical adenopathy.  Neurological: He is alert and oriented to person, place, and time.  Skin: Skin is warm and dry. He is not diaphoretic.  Psychiatric: He has a normal mood and affect.      Results for orders placed or performed in visit on 01/08/15 (from the past 24 hour(s))  POCT CBC     Status: Abnormal   Collection Time: 01/08/15  9:02 AM  Result Value Ref Range   WBC 8.8 4.6 - 10.2 K/uL   Lymph, poc 1.7 0.6 - 3.4   POC LYMPH PERCENT 19.3 10 - 50 %L   MID (cbc) 1.3 (A) 0 - 0.9   POC MID % 14.7 (A) 0 - 12 %M   POC Granulocyte 5.8 2 - 6.9   Granulocyte percent 66.0 37 - 80 %G   RBC 5.51 4.69 - 6.13 M/uL   Hemoglobin 16.5 14.1 - 18.1 g/dL   HCT, POC 49.3 43.5 - 53.7 %   MCV 89.6 80 - 97 fL   MCH, POC 30.0 27 - 31.2 pg   MCHC 33.5 31.8 - 35.4 g/dL   RDW, POC 12.9 %   Platelet Count, POC 241 142 - 424 K/uL   MPV 7.3 0 - 99.8 fL   [facility] reading (PRIMARY) by  Dr. Elder Cyphers: Bronchial thickening in the right lower lobe.  Other wise to acute cardiopulmonary disease.  Patient with good increase in lung function post neb.    ASSESSMENT & PLAN  George Watson was seen today for cough and uri.  Diagnoses and all orders for this visit:  SOB (shortness of breath): Patient advised to follow up in 7 days for follow up, at which point if he is not back to his baseline he may benefit from a change in his COPD regimen or seeing a pulmonologist.   COPD exacerbation Orders: -     predniSONE (DELTASONE) 20 MG tablet; Take 3 PO QAM x3days, 2 PO QAM x3days, 1 PO QAM x3days -     azithromycin (ZITHROMAX) 500 MG tablet; Take one tablet every 24 hours.  Wheezing -     albuterol (PROVENTIL) (2.5 MG/3ML) 0.083% nebulizer solution 2.5 mg; Take 3 mLs (2.5 mg total) by nebulization once. -     ipratropium (ATROVENT) nebulizer solution 0.5 mg; Take 2.5  mLs (0.5 mg total) by nebulization once.  History of COPD Orders: -     POCT CBC -     DG Chest 2 View; Future     The patient was advised to call or come back to clinic if he does not see an improvement in symptoms, or worsens with the above plan.   George Watson, MHS, PA-C [facility] 01/08/2015 10:13 AM

## 2015-01-08 NOTE — Patient Instructions (Signed)
Please return to clinic in 7 days.  If you are not back to your baseline you may need adjustments to your COPD regimen or may benefit from seeing a pulmonologist.

## 2015-01-08 NOTE — Progress Notes (Signed)
Post breathing treatment 

## 2015-01-08 NOTE — Telephone Encounter (Signed)
Found the form in the scan box and re-faxed it to [employer].

## 2015-01-08 NOTE — Progress Notes (Signed)
Pre breathing treatment.

## 2015-01-11 ENCOUNTER — Other Ambulatory Visit: Payer: Self-pay | Admitting: Emergency Medicine

## 2015-01-15 ENCOUNTER — Other Ambulatory Visit: Payer: Self-pay

## 2015-01-23 ENCOUNTER — Encounter: Payer: Self-pay | Admitting: Emergency Medicine

## 2015-01-23 ENCOUNTER — Ambulatory Visit (INDEPENDENT_AMBULATORY_CARE_PROVIDER_SITE_OTHER): Payer: PRIVATE HEALTH INSURANCE | Admitting: Emergency Medicine

## 2015-01-23 VITALS — BP 108/66 | HR 74 | Temp 97.9°F | Resp 16 | Ht 71.0 in | Wt 168.2 lb

## 2015-01-23 DIAGNOSIS — E119 Type 2 diabetes mellitus without complications: Secondary | ICD-10-CM | POA: Diagnosis not present

## 2015-01-23 DIAGNOSIS — E785 Hyperlipidemia, unspecified: Secondary | ICD-10-CM | POA: Diagnosis not present

## 2015-01-23 DIAGNOSIS — J441 Chronic obstructive pulmonary disease with (acute) exacerbation: Secondary | ICD-10-CM | POA: Diagnosis not present

## 2015-01-23 LAB — GLUCOSE, POCT (MANUAL RESULT ENTRY): POC GLUCOSE: 89 mg/dL (ref 70–99)

## 2015-01-23 LAB — LIPID PANEL
CHOL/HDL RATIO: 6.3 ratio
Cholesterol: 207 mg/dL — ABNORMAL HIGH (ref 0–200)
HDL: 33 mg/dL — ABNORMAL LOW (ref >=40)
LDL Cholesterol: 147 mg/dL — ABNORMAL HIGH (ref 0–99)
TRIGLYCERIDES: 137 mg/dL (ref <150)
VLDL: 27 mg/dL (ref 0–40)

## 2015-01-23 LAB — POCT GLYCOSYLATED HEMOGLOBIN (HGB A1C): HEMOGLOBIN A1C: 6.1

## 2015-01-23 MED ORDER — TIOTROPIUM BROMIDE MONOHYDRATE 18 MCG IN CAPS: ORAL_CAPSULE | RESPIRATORY_TRACT | Status: DC

## 2015-01-23 NOTE — Addendum Note (Signed)
Addended by: Kem Boroughs D on: 01/23/2015 05:41 PM   Modules accepted: Orders

## 2015-01-23 NOTE — Progress Notes (Signed)
   Subjective:  This chart was scribed for George Jordan, MD by George Watson, medical scribe at [facility].The patient was seen in exam room 21 and the patient's care was started at 4:20 PM.   Patient ID: George Watson, male    DOB: Nov 13, 1945, 69 y.o.   MRN: [id number] Chief Complaint  Patient presents with  . Follow-up    6 MONTHS   HPI HPI Comments: George Watson is a 69 y.o. male who presents to [facility] for a 6 month follow up regarding shortness of breath. No sure if this is allergies. He does roughly 10-15 hours of outside work, but does not wear a mask. For his lungs he has qvar two puffs once a day in the morning, and albuterol as needed.Afran nasal spray or saline nasal spray. Quit smoking 12 years ago. Last seen my George Watson put on a course of prednisone and azithromycin. .  Review of Systems  Respiratory: Positive for shortness of breath.   Allergic/Immunologic: Positive for environmental allergies.      Objective:  BP 108/66 mmHg  Pulse 74  Temp(Src) 97.9 F (36.6 C) (Oral)  Resp 16  Ht 5\' 11"  (1.803 m)  Wt 168 lb 3.2 oz (76.295 kg)  BMI 23.47 kg/m2 Physical Exam  Vitals reviewed. CONSTITUTIONAL: Well developed/well nourished HEAD: Normocephalic/atraumatic EYES: EOMI/PERRL ENMT: Mucous membranes moist NECK: supple no meningeal signs SPINE/BACK:entire spine nontender CV: S1/S2 noted, no murmurs/rubs/gallops noted LUNGS: Decreased breath sounds in both bases but no wheezes. A peak flow of 400. ABDOMEN: soft, nontender, no rebound or guarding, bowel sounds noted throughout abdomen GU:no cva tenderness NEURO: Pt is awake/alert/appropriate, moves all extremitiesx4.  No facial droop.   EXTREMITIES: pulses normal/equal, full ROM SKIN: warm, color normal PSYCH: no abnormalities of mood noted, alert and oriented to situation    Assessment & Plan:    1. COPD exacerbation  - tiotropium (SPIRIVA HANDIHALER) 18 MCG  inhalation capsule; Once at night  Dispense: 30 capsule; Refill: 11  2. Type 2 diabetes mellitus without complication Results for orders placed or performed in visit on 01/23/15  POCT glucose (manual entry)  Result Value Ref Range   POC Glucose 89 70 - 99 mg/dl  POCT glycosylated hemoglobin (Hb A1C)  Result Value Ref Range   Hemoglobin A1C 6.1    - POCT glucose (manual entry) - POCT glycosylated hemoglobin (Hb A1C)  3. Hyperlipidemia  - Lipid panel   I personally performed the services described in this documentation, which was scribed in my presence. The recorded information has been reviewed and is accurate.  George Queen, MD  [facility]  01/23/2015 5:38 PM

## 2015-02-02 ENCOUNTER — Encounter: Payer: Self-pay | Admitting: Physician Assistant

## 2015-02-05 ENCOUNTER — Ambulatory Visit (HOSPITAL_COMMUNITY)
Admission: RE | Admit: 2015-02-05 | Discharge: 2015-02-05 | Disposition: A | Discharge status: Home / Self Care | Payer: PRIVATE HEALTH INSURANCE | Source: Ambulatory Visit | Attending: Urology | Admitting: Urology

## 2015-02-05 DIAGNOSIS — C61 Malignant neoplasm of prostate: Secondary | ICD-10-CM

## 2015-02-05 LAB — POCT I-STAT CREATININE: CREATININE: 0.9 mg/dL (ref 0.61–1.24)

## 2015-02-05 MED ORDER — GADOBENATE DIMEGLUMINE 529 MG/ML IV SOLN
15.0000 mL | Freq: Once | INTRAVENOUS | Status: AC | PRN
  Administered 2015-02-05: 15 mL via INTRAVENOUS

## 2015-04-24 ENCOUNTER — Encounter: Payer: Self-pay | Admitting: Emergency Medicine

## 2015-05-25 ENCOUNTER — Ambulatory Visit (INDEPENDENT_AMBULATORY_CARE_PROVIDER_SITE_OTHER): Payer: PRIVATE HEALTH INSURANCE | Admitting: Internal Medicine

## 2015-05-25 VITALS — BP 120/60 | HR 72 | Temp 98.1°F | Resp 18 | Ht 71.0 in | Wt 167.0 lb

## 2015-05-25 DIAGNOSIS — J441 Chronic obstructive pulmonary disease with (acute) exacerbation: Secondary | ICD-10-CM

## 2015-05-25 MED ORDER — LEVOFLOXACIN 500 MG PO TABS: 500.0000 mg | ORAL_TABLET | Freq: Every day | ORAL | Status: DC

## 2015-05-25 NOTE — Progress Notes (Signed)
Subjective:  This chart was scribed for Tami Lin, MD by Thea Alken, ED Scribe. This patient was seen in room 7 and the patient's care was started at 2:43 PM.   Patient ID: George Watson, male    DOB: July 01, 1946, 69 y.o.   MRN: [id number]  HPI Chief Complaint  Patient presents with  . URI    Hx of COPD  x 2 days  . Cough    Wheezing   HPI Comments: George Watson is a 69 y.o. male who presents to the [facility] complaining of productive cough with green mucous and associated wheezing that began 2 days ago. He has hx of COPD and is 3 different inhalers for wheezing. He is on qvar, spiriva and proair He denies fever.   Past Medical History  Diagnosis Date  . Hepatitis C 10/1996  . H/O drug abuse     multisubstance  . Other and unspecified hyperlipidemia   . HSV-2 (herpes simplex virus 2) infection   . Elevated PSA   . COPD (chronic obstructive pulmonary disease) ([place])   . BPH (benign prostatic hypertrophy)   . Inguinal hernia     right  . Hx of biopsy 08/2004    liver  . DDD (degenerative disc disease), lumbosacral   . Essential hypertension, benign     diet controlled  . GERD (gastroesophageal reflux disease)   . Prostate cancer ([place])   . Fracture of shaft of clavicle   . Substance abuse   . Tuberculosis    Prior to Admission medications   Medication Sig Start Date End Date Taking? Authorizing Provider  dutasteride (AVODART) 0.5 MG capsule Take 0.5 mg by mouth daily.   Yes Historical Provider, MD  Glucosamine-Chondroit-Vit C-Mn (GLUCOSAMINE 1500 COMPLEX PO) Take 2 tablets by mouth daily with breakfast.    Yes Historical Provider, MD  Multiple Vitamin (MULTIVITAMIN) tablet Take 1 tablet by mouth daily.   Yes Historical Provider, MD  mupirocin ointment (BACTROBAN) 2 % Applied to the irritated area twice a day 01/24/14  Yes Darlyne Russian, MD  Omega-3 Fatty Acids (FISH OIL) 1000 MG CAPS Take 1 capsule by mouth 3 (three) times daily.    Yes  Historical Provider, MD  PROAIR HFA 108 (90 BASE) MCG/ACT inhaler INHALE 2 PUFFS INTO THE LUNGS EVERY 6 (SIX) HOURS AS NEEDED FOR SHORTNESS OF BREATH. 01/11/15  Yes Darlyne Russian, MD  QVAR 80 MCG/ACT inhaler INHALE TWO PUFFS TWICE DAILY AS DIRECTED 06/25/14  Yes Roselee Culver, MD  Tamsulosin HCl (FLOMAX) 0.4 MG CAPS Take 0.4 mg by mouth daily after breakfast.   Yes Historical Provider, MD  tiotropium (SPIRIVA HANDIHALER) 18 MCG inhalation capsule Once at night 01/23/15  Yes Darlyne Russian, MD  metaxalone (SKELAXIN) 800 MG tablet TAKE 1 TABLET (800 MG TOTAL) BY MOUTH 3 (THREE) TIMES DAILY AS NEEDED FOR BACK PAIN. Patient not taking: Reported on 05/25/2015 09/25/14   Darlyne Russian, MD  predniSONE (DELTASONE) 20 MG tablet Take 3 PO QAM x3days, 2 PO QAM x3days, 1 PO QAM x3days Patient not taking: Reported on 05/25/2015 01/08/15   Tereasa Coop, PA-C    Review of Systems  Respiratory: Positive for wheezing.     Objective:   Physical Exam  Constitutional: He is oriented to person, place, and time. He appears well-developed and well-nourished. No distress.  HENT:  Head: Normocephalic and atraumatic.  Nose: Nose normal.  Mouth/Throat: Oropharynx is clear and moist.  Eyes:  Conjunctivae and EOM are normal.  Neck: Neck supple.  Cardiovascular: Normal rate, regular rhythm and normal heart sounds.   Pulmonary/Chest: Effort normal. He has rhonchi ( right lung posteriorly).  Musculoskeletal: Normal range of motion.  Lymphadenopathy:    He has no cervical adenopathy.  Neurological: He is alert and oriented to person, place, and time.  Skin: Skin is warm and dry.  Psychiatric: He has a normal mood and affect. His behavior is normal.  Nursing note and vitals reviewed.   Filed Vitals:   05/25/15 1324  BP: 120/60  Pulse: 72  Temp: 98.1 F (36.7 C)  TempSrc: Oral  Resp: 18  Height: 5\' 11"  (1.803 m)  Weight: 167 lb (75.751 kg)  SpO2: 97%    Assessment & Plan:   1. COPD exacerbation ([place])      Meds ordered this encounter  Medications  . levofloxacin (LEVAQUIN) 500 MG tablet    Sig: Take 1 tablet (500 mg total) by mouth daily.    Dispense:  7 tablet    Refill:  0  continue current pulmonary regimen CXR if not better in 3-4 days   By signing my name below, I, Raven Small, attest that this documentation has been prepared under the direction and in the presence of Tami Lin, MD.  Electronically Signed: Thea Alken, ED Scribe. 05/25/2015. 2:49 PM.  I have completed the patient encounter in its entirety as documented by the scribe, with editing by me where necessary. Marlicia Sroka P. Laney Pastor, M.D.

## 2015-07-09 ENCOUNTER — Ambulatory Visit (INDEPENDENT_AMBULATORY_CARE_PROVIDER_SITE_OTHER): Payer: PRIVATE HEALTH INSURANCE | Admitting: Emergency Medicine

## 2015-07-09 VITALS — BP 128/76 | HR 71 | Temp 97.7°F | Resp 16 | Ht 71.0 in | Wt 170.0 lb

## 2015-07-09 DIAGNOSIS — R059 Cough, unspecified: Secondary | ICD-10-CM

## 2015-07-09 DIAGNOSIS — R05 Cough: Secondary | ICD-10-CM

## 2015-07-09 DIAGNOSIS — J441 Chronic obstructive pulmonary disease with (acute) exacerbation: Secondary | ICD-10-CM | POA: Diagnosis not present

## 2015-07-09 MED ORDER — DOXYCYCLINE HYCLATE 100 MG PO TABS: 100.0000 mg | ORAL_TABLET | Freq: Two times a day (BID) | ORAL | Status: DC

## 2015-07-09 NOTE — Patient Instructions (Signed)
Chronic Obstructive Pulmonary Disease Chronic obstructive pulmonary disease (COPD) is a common lung condition in which airflow from the lungs is limited. COPD is a general term that can be used to describe many different lung problems that limit airflow, including both chronic bronchitis and emphysema. If you have COPD, your lung function will probably never return to normal, but there are measures you can take to improve lung function and make yourself feel better. CAUSES   Smoking (common).  Exposure to secondhand smoke.  Genetic problems.  Chronic inflammatory lung diseases or recurrent infections. SYMPTOMS  Shortness of breath, especially with physical activity.  Deep, persistent (chronic) cough with a large amount of thick mucus.  Wheezing.  Rapid breaths (tachypnea).  Gray or bluish discoloration (cyanosis) of the skin, especially in your fingers, toes, or lips.  Fatigue.  Weight loss.  Frequent infections or episodes when breathing symptoms become much worse (exacerbations).  Chest tightness. DIAGNOSIS Your health care provider will take a medical history and perform a physical examination to diagnose COPD. Additional tests for COPD may include:  Lung (pulmonary) function tests.  Chest X-ray.  CT scan.  Blood tests. TREATMENT  Treatment for COPD may include:  Inhaler and nebulizer medicines. These help manage the symptoms of COPD and make your breathing more comfortable.  Supplemental oxygen. Supplemental oxygen is only helpful if you have a low oxygen level in your blood.  Exercise and physical activity. These are beneficial for nearly all people with COPD.  Lung surgery or transplant.  Nutrition therapy to gain weight, if you are underweight.  Pulmonary rehabilitation. This may involve working with a team of health care providers and specialists, such as respiratory, occupational, and physical therapists. HOME CARE INSTRUCTIONS  Take all medicines  (inhaled or pills) as directed by your health care provider.  Avoid over-the-counter medicines or cough syrups that dry up your airway (such as antihistamines) and slow down the elimination of secretions unless instructed otherwise by your health care provider.  If you are a smoker, the most important thing that you can do is stop smoking. Continuing to smoke will cause further lung damage and breathing trouble. Ask your health care provider for help with quitting smoking. He or she can direct you to community resources or hospitals that provide support.  Avoid exposure to irritants such as smoke, chemicals, and fumes that aggravate your breathing.  Use oxygen therapy and pulmonary rehabilitation if directed by your health care provider. If you require home oxygen therapy, ask your health care provider whether you should purchase a pulse oximeter to measure your oxygen level at home.  Avoid contact with individuals who have a contagious illness.  Avoid extreme temperature and humidity changes.  Eat healthy foods. Eating smaller, more frequent meals and resting before meals may help you maintain your strength.  Stay active, but balance activity with periods of rest. Exercise and physical activity will help you maintain your ability to do things you want to do.  Preventing infection and hospitalization is very important when you have COPD. Make sure to receive all the vaccines your health care provider recommends, especially the pneumococcal and influenza vaccines. Ask your health care provider whether you need a pneumonia vaccine.  Learn and use relaxation techniques to manage stress.  Learn and use controlled breathing techniques as directed by your health care provider. Controlled breathing techniques include:  Pursed lip breathing. Start by breathing in (inhaling) through your nose for 1 second. Then, purse your lips as if you were   going to whistle and breathe out (exhale) through the  pursed lips for 2 seconds.  Diaphragmatic breathing. Start by putting one hand on your abdomen just above your waist. Inhale slowly through your nose. The hand on your abdomen should move out. Then purse your lips and exhale slowly. You should be able to feel the hand on your abdomen moving in as you exhale.  Learn and use controlled coughing to clear mucus from your lungs. Controlled coughing is a series of short, progressive coughs. The steps of controlled coughing are: 1. Lean your head slightly forward. 2. Breathe in deeply using diaphragmatic breathing. 3. Try to hold your breath for 3 seconds. 4. Keep your mouth slightly open while coughing twice. 5. Spit any mucus out into a tissue. 6. Rest and repeat the steps once or twice as needed. SEEK MEDICAL CARE IF:  You are coughing up more mucus than usual.  There is a change in the color or thickness of your mucus.  Your breathing is more labored than usual.  Your breathing is faster than usual. SEEK IMMEDIATE MEDICAL CARE IF:  You have shortness of breath while you are resting.  You have shortness of breath that prevents you from:  Being able to talk.  Performing your usual physical activities.  You have chest pain lasting longer than 5 minutes.  Your skin color is more cyanotic than usual.  You measure low oxygen saturations for longer than 5 minutes with a pulse oximeter. MAKE SURE YOU:  Understand these instructions.  Will watch your condition.  Will get help right away if you are not doing well or get worse.   This information is not intended to replace advice given to you by your health care provider. Make sure you discuss any questions you have with your health care provider.   Document Released: 04/16/2005 Document Revised: 07/28/2014 Document Reviewed: 03/03/2013 Elsevier Interactive Patient Education 2016 [employer]  

## 2015-07-09 NOTE — Progress Notes (Signed)
Patient ID: George Watson, male   DOB: 08-14-1945, 69 y.o.   MRN: [id number]    By signing my name below, I, [name], attest that this documentation has been prepared under the direction and in the presence of Darlyne Russian, MD Electronically Signed: Ladene Artist, ED Scribe 07/09/2015 at 3:57 PM.  Chief Complaint:  Chief Complaint  Patient presents with  . Cough    x 2 days  . Fatigue  . Sore Throat  . troble breathing   HPI: [name] is a 69 y.o. male, with a h/o COPD, who reports to [facility] today complaining of dry cough for the past 2 days. Pt reports associated fatigue and mild sore throat over the past 2 days as well. He has used his Qvar in the mornings, Spiriva at night and Proair more often over the past few days. He denies fever and congestion. Pt reports sick contacts at work with similar symptoms. Pt quit smoking 12.5 years ago.   Immunizations Pt has had a flu vaccine this year.   Pt has 3 brothers and 3 sisters in various states. 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   Past Medical History  Diagnosis Date  . Hepatitis C 10/1996  . H/O drug abuse     multisubstance  . Other and unspecified hyperlipidemia   . HSV-2 (herpes simplex virus 2) infection   . Elevated PSA   . COPD (chronic obstructive pulmonary disease) ([place])   . BPH (benign prostatic hypertrophy)   . Inguinal hernia     right  . Hx of biopsy 08/2004    liver  . DDD (degenerative disc disease), lumbosacral   . Essential hypertension, benign     diet controlled  . GERD (gastroesophageal reflux disease)   . Prostate cancer ([place])   . Fracture of shaft of clavicle   . Substance abuse   . Tuberculosis    Past Surgical History  Procedure Laterality Date  . Fracture ribs      3  . Cervical spine surgery    . Right shoulder arthroscopy  2011  . Right rotator cuff  1992  . Left knee meniscus repair  1992  . Inguinal hernia  repair  07/01/2012    Procedure: HERNIA REPAIR INGUINAL ADULT;  Surgeon: Madilyn Hook, DO;  Location: [facility];  Service: General;  Laterality: Right;  with Mesh  . Hernia repair     Social History   Social History  . Marital Status: Married    Spouse Name: N/A  . Number of Children: N/A  . Years of Education: N/A   Social History Main Topics  . Smoking status: Former Smoker -- 43 years    Types: Cigarettes    Quit date: 07/21/2002  . Smokeless tobacco: Never Used  . Alcohol Use: No  . Drug Use: No     Comment: 26 years ago cocaine, heroin. methadone  . Sexual Activity: Yes     Comment: number of sex partners in the last 75 months  1   Other Topics Concern  . None   Social History Narrative   Married to [employer]. Exercise cardio daily for 30 minutes. Education: [employer].   Family History  Problem Relation Age of Onset  . Cancer Sister     breast  . Leukemia Mother   . Cancer Father    No Known Allergies Prior to Admission medications   Medication  Sig Start Date End Date Taking? Authorizing Provider  dutasteride (AVODART) 0.5 MG capsule Take 0.5 mg by mouth daily.   Yes Historical Provider, MD  Glucosamine-Chondroit-Vit C-Mn (GLUCOSAMINE 1500 COMPLEX PO) Take 2 tablets by mouth daily with breakfast.    Yes Historical Provider, MD  Multiple Vitamin (MULTIVITAMIN) tablet Take 1 tablet by mouth daily.   Yes Historical Provider, MD  mupirocin ointment (BACTROBAN) 2 % Applied to the irritated area twice a day 01/24/14  Yes Darlyne Russian, MD  Omega-3 Fatty Acids (FISH OIL) 1000 MG CAPS Take 1 capsule by mouth 3 (three) times daily.    Yes Historical Provider, MD  PROAIR HFA 108 (90 BASE) MCG/ACT inhaler INHALE 2 PUFFS INTO THE LUNGS EVERY 6 (SIX) HOURS AS NEEDED FOR SHORTNESS OF BREATH. 01/11/15  Yes Darlyne Russian, MD  QVAR 80 MCG/ACT inhaler INHALE TWO PUFFS TWICE DAILY AS DIRECTED 06/25/14  Yes Roselee Culver, MD  Tamsulosin HCl (FLOMAX) 0.4 MG CAPS Take 0.4 mg by mouth  daily after breakfast.   Yes Historical Provider, MD  tiotropium (SPIRIVA HANDIHALER) 18 MCG inhalation capsule Once at night 01/23/15  Yes Darlyne Russian, MD  levofloxacin (LEVAQUIN) 500 MG tablet Take 1 tablet (500 mg total) by mouth daily. Patient not taking: Reported on 07/09/2015 05/25/15   Leandrew Koyanagi, MD  metaxalone (SKELAXIN) 800 MG tablet TAKE 1 TABLET (800 MG TOTAL) BY MOUTH 3 (THREE) TIMES DAILY AS NEEDED FOR BACK PAIN. Patient not taking: Reported on 05/25/2015 09/25/14   Darlyne Russian, MD  predniSONE (DELTASONE) 20 MG tablet Take 3 PO QAM x3days, 2 PO QAM x3days, 1 PO QAM x3days Patient not taking: Reported on 05/25/2015 01/08/15   Tereasa Coop, PA-C   ROS: The patient denies fevers, chills, night sweats, unintentional weight loss, congestion, chest pain, palpitations, wheezing, dyspnea on exertion, nausea, vomiting, abdominal pain, dysuria, hematuria, melena, numbness, weakness, or tingling. +fatigue, +sore throat, +cough  All other systems have been reviewed and were otherwise negative with the exception of those mentioned in the HPI and as above.    PHYSICAL EXAM: Filed Vitals:   07/09/15 1514  BP: 128/76  Pulse: 71  Temp: 97.7 F (36.5 C)  Resp: 16   Body mass index is 23.72 kg/(m^2).  General: Alert, no acute distress HEENT:  Normocephalic, atraumatic, oropharynx patent. Eye: [facility] Cardiovascular:  Regular rate and rhythm, no rubs murmurs or gallops. No Carotid bruits, radial pulse intact. No pedal edema.  Respiratory: Clear to auscultation bilaterally. No wheezes, rales, or rhonchi. No cyanosis, no use of accessory musculature Abdominal: No organomegaly, abdomen is soft and non-tender, positive bowel sounds. No masses. Musculoskeletal: Gait intact. No edema, tenderness Skin: No rashes. Neurologic: Facial musculature symmetric. Psychiatric: Patient acts appropriately throughout our interaction. Lymphatic: No cervical or submandibular  lymphadenopathy  LABS:  EKG/XRAY:   Primary read interpreted by [facility] at [facility].  ASSESSMENT/PLAN: He is currently on Spiriva, Qvar, and has pro-air rescue inhaler. Will cover with doxycycline for now. If he calls we may need to add a taper dose of prednisone.I personally performed the services described in this documentation, which was scribed in my presence. The recorded information has been reviewed and is accurate.    Gross sideeffects, risk and benefits, and alternatives of medications d/w patient. Patient is aware that all medications have potential sideeffects and we are unable to predict every sideeffect or drug-drug interaction that may occur.  [name] MD 07/09/2015 3:50 PM

## 2015-07-26 ENCOUNTER — Ambulatory Visit (INDEPENDENT_AMBULATORY_CARE_PROVIDER_SITE_OTHER): Payer: PRIVATE HEALTH INSURANCE | Admitting: Emergency Medicine

## 2015-07-26 ENCOUNTER — Encounter: Payer: Self-pay | Admitting: Emergency Medicine

## 2015-07-26 VITALS — BP 124/80 | HR 67 | Temp 97.5°F | Resp 16 | Ht 71.0 in | Wt 168.0 lb

## 2015-07-26 DIAGNOSIS — Z0000 Encounter for general adult medical examination without abnormal findings: Secondary | ICD-10-CM

## 2015-07-26 DIAGNOSIS — E119 Type 2 diabetes mellitus without complications: Secondary | ICD-10-CM

## 2015-07-26 DIAGNOSIS — C61 Malignant neoplasm of prostate: Secondary | ICD-10-CM

## 2015-07-26 DIAGNOSIS — E785 Hyperlipidemia, unspecified: Secondary | ICD-10-CM

## 2015-07-26 DIAGNOSIS — Z87891 Personal history of nicotine dependence: Secondary | ICD-10-CM | POA: Diagnosis not present

## 2015-07-26 DIAGNOSIS — Z8709 Personal history of other diseases of the respiratory system: Secondary | ICD-10-CM | POA: Diagnosis not present

## 2015-07-26 DIAGNOSIS — M542 Cervicalgia: Secondary | ICD-10-CM | POA: Diagnosis not present

## 2015-07-26 LAB — CBC WITH DIFFERENTIAL/PLATELET
BASOS PCT: 1 % (ref 0–1)
Basophils Absolute: 0.1 10*3/uL (ref 0.0–0.1)
Eosinophils Absolute: 0.4 10*3/uL (ref 0.0–0.7)
Eosinophils Relative: 5 % (ref 0–5)
HCT: 49.2 % (ref 39.0–52.0)
Hemoglobin: 17 g/dL (ref 13.0–17.0)
Lymphocytes Relative: 32 % (ref 12–46)
Lymphs Abs: 2.7 10*3/uL (ref 0.7–4.0)
MCH: 31 pg (ref 26.0–34.0)
MCHC: 34.6 g/dL (ref 30.0–36.0)
MCV: 89.8 fL (ref 78.0–100.0)
MONO ABS: 0.9 10*3/uL (ref 0.1–1.0)
MONOS PCT: 11 % (ref 3–12)
MPV: 9.8 fL (ref 8.6–12.4)
Neutro Abs: 4.3 10*3/uL (ref 1.7–7.7)
Neutrophils Relative %: 51 % (ref 43–77)
PLATELETS: 263 10*3/uL (ref 150–400)
RBC: 5.48 MIL/uL (ref 4.22–5.81)
RDW: 13.3 % (ref 11.5–15.5)
WBC: 8.4 10*3/uL (ref 4.0–10.5)

## 2015-07-26 LAB — COMPLETE METABOLIC PANEL WITH GFR
ALT: 21 U/L (ref 9–46)
AST: 20 U/L (ref 10–35)
Albumin: 4.7 g/dL (ref 3.6–5.1)
Alkaline Phosphatase: 51 U/L (ref 40–115)
BUN: 22 mg/dL (ref 7–25)
CO2: 25 mmol/L (ref 20–31)
Calcium: 9.9 mg/dL (ref 8.6–10.3)
Chloride: 101 mmol/L (ref 98–110)
Creat: 0.76 mg/dL (ref 0.70–1.25)
GFR, Est African American: >89 mL/min (ref >=60)
GFR, Est Non African American: >89 mL/min (ref >=60)
GLUCOSE: 107 mg/dL — AB (ref 65–99)
POTASSIUM: 4.5 mmol/L (ref 3.5–5.3)
SODIUM: 135 mmol/L (ref 135–146)
Total Bilirubin: 0.8 mg/dL (ref 0.2–1.2)
Total Protein: 7.7 g/dL (ref 6.1–8.1)

## 2015-07-26 LAB — LIPID PANEL
Cholesterol: 223 mg/dL — ABNORMAL HIGH (ref 125–200)
HDL: 33 mg/dL — AB (ref >=40)
LDL Cholesterol: 150 mg/dL — ABNORMAL HIGH (ref <130)
TRIGLYCERIDES: 202 mg/dL — AB (ref <150)
Total CHOL/HDL Ratio: 6.8 Ratio — ABNORMAL HIGH (ref <=5.0)
VLDL: 40 mg/dL — AB (ref <30)

## 2015-07-26 LAB — MICROALBUMIN, URINE: Microalb, Ur: 0.7 mg/dL

## 2015-07-26 LAB — GLUCOSE, POCT (MANUAL RESULT ENTRY): POC Glucose: 109 mg/dl — AB (ref 70–99)

## 2015-07-26 LAB — POCT GLYCOSYLATED HEMOGLOBIN (HGB A1C): Hemoglobin A1C: 5.9

## 2015-07-26 NOTE — Progress Notes (Signed)
Patient ID: George Watson, male   DOB: [birth year], 70 y.o.   MRN: [id number]     By signing my name below, I, Zola Button, attest that this documentation has been prepared under the direction and in the presence of [name], MD.  Electronically Signed: Zola Button, Medical Scribe. 07/26/2015. 8:38 AM.   Chief Complaint:  Chief Complaint  Patient presents with  . Annual Exam    HPI: George Watson is a 70 y.o. male with a history of prostate cancer and BPH who reports to [facility] today for an annual exam. Patient has been doing well and notes his breathing has been fine. He has been able to exercise without any problems. He sees his urologist, Dr. Gaynelle Arabian, once a year. He still sees [name]. He does not see cardiology anymore. His FMHx includes esophageal cancer (father), leukemia (mother), breast cancer (sister), skin cancer (brother), and prostate cancer (another brother). Patient does have a history of smoking (64.5 pack-years, 1.5 ppd from age [age], quit 12 years ago).  Patient would like to transfer his care to Dr. Carlota Raspberry after I retire. He is going to [place] next month for surfing and horseback riding. He retired at age 54, but has since returned to work. He currently works about 30 hours/week at [employer].  Past Medical History  Diagnosis Date  . Hepatitis C 10/1996  . H/O drug abuse     multisubstance  . Other and unspecified hyperlipidemia   . HSV-2 (herpes simplex virus 2) infection   . Elevated PSA   . COPD (chronic obstructive pulmonary disease) ([place])   . BPH (benign prostatic hypertrophy)   . Inguinal hernia     right  . Hx of biopsy 08/2004    liver  . DDD (degenerative disc disease), lumbosacral   . Essential hypertension, benign     diet controlled  . GERD (gastroesophageal reflux disease)   . Prostate cancer ([place])   . Fracture of shaft of clavicle   . Substance abuse   . Tuberculosis    Past Surgical History  Procedure Laterality Date  . Fracture  ribs      3  . Cervical spine surgery    . Right shoulder arthroscopy  2011  . Right rotator cuff  1992  . Left knee meniscus repair  1992  . Inguinal hernia repair  07/01/2012    Procedure: HERNIA REPAIR INGUINAL ADULT;  Surgeon: Madilyn Hook, DO;  Location: [facility];  Service: General;  Laterality: Right;  with Mesh  . Hernia repair     Social History   Social History  . Marital Status: Married    Spouse Name: N/A  . Number of Children: N/A  . Years of Education: N/A   Occupational History  . Driver    Social History Main Topics  . Smoking status: Former Smoker -- 43 years    Types: Cigarettes    Quit date: 07/21/2002  . Smokeless tobacco: Never Used  . Alcohol Use: No  . Drug Use: No     Comment: 26 years ago cocaine, heroin. methadone  . Sexual Activity: Yes     Comment: number of sex partners in the last 18 months  1   Other Topics Concern  . None   Social History Narrative      Married to [employer]. Exercise cardio daily for 30 minutes. Education: [employer].   Family History  Problem Relation Age of Onset  . Cancer Sister  breast  . Leukemia Mother   . Cancer Father    No Known Allergies Prior to Admission medications   Medication Sig Start Date End Date Taking? Authorizing Provider  doxycycline (VIBRA-TABS) 100 MG tablet Take 1 tablet (100 mg total) by mouth 2 (two) times daily. 07/09/15   Darlyne Russian, MD  dutasteride (AVODART) 0.5 MG capsule Take 0.5 mg by mouth daily.    Historical Provider, MD  Glucosamine-Chondroit-Vit C-Mn (GLUCOSAMINE 1500 COMPLEX PO) Take 2 tablets by mouth daily with breakfast.     Historical Provider, MD  levofloxacin (LEVAQUIN) 500 MG tablet Take 1 tablet (500 mg total) by mouth daily. Patient not taking: Reported on 07/09/2015 05/25/15   Leandrew Koyanagi, MD  metaxalone (SKELAXIN) 800 MG tablet TAKE 1 TABLET (800 MG TOTAL) BY MOUTH 3 (THREE) TIMES DAILY AS NEEDED FOR BACK PAIN. Patient not taking: Reported on 05/25/2015  09/25/14   Darlyne Russian, MD  Multiple Vitamin (MULTIVITAMIN) tablet Take 1 tablet by mouth daily.    Historical Provider, MD  mupirocin ointment (BACTROBAN) 2 % Applied to the irritated area twice a day 01/24/14   Darlyne Russian, MD  Omega-3 Fatty Acids (FISH OIL) 1000 MG CAPS Take 1 capsule by mouth 3 (three) times daily.     Historical Provider, MD  predniSONE (DELTASONE) 20 MG tablet Take 3 PO QAM x3days, 2 PO QAM x3days, 1 PO QAM x3days Patient not taking: Reported on 05/25/2015 01/08/15   Tereasa Coop, PA-C  PROAIR HFA 108 (90 BASE) MCG/ACT inhaler INHALE 2 PUFFS INTO THE LUNGS EVERY 6 (SIX) HOURS AS NEEDED FOR SHORTNESS OF BREATH. 01/11/15   Darlyne Russian, MD  QVAR 80 MCG/ACT inhaler INHALE TWO PUFFS TWICE DAILY AS DIRECTED 06/25/14   Roselee Culver, MD  Tamsulosin HCl (FLOMAX) 0.4 MG CAPS Take 0.4 mg by mouth daily after breakfast.    Historical Provider, MD  tiotropium (SPIRIVA HANDIHALER) 18 MCG inhalation capsule Once at night 01/23/15   Darlyne Russian, MD     ROS: Reviewed per patient health survey. Positive for: urinary frequency, arthralgias, back pain, neck pain, neck stiffness.  All other systems have been reviewed and were otherwise negative with the exception of those mentioned in the HPI and as above.    PHYSICAL EXAM: Filed Vitals:   07/26/15 0829  BP: 124/80  Pulse: 67  Temp: 97.5 F (36.4 C)  Resp: 16   Body mass index is 23.44 kg/(m^2).   General: Alert, no acute distress HEENT:  Normocephalic, atraumatic, oropharynx patent. Eye: [facility] Cardiovascular:  Regular rate and rhythm. Respiratory: Dry rales, both bases. Abdominal: No organomegaly, abdomen is soft and non-tender, positive bowel sounds.  No masses. Musculoskeletal: Gait intact. No edema. Pulses normal. Skin: No rashes. Neurologic: Facial musculature symmetric. Psychiatric: Patient acts appropriately throughout our interaction. Lymphatic: No cervical or submandibular  lymphadenopathy Genitourinary: Prostate exam deferred. He sees Dr. Gaynelle Arabian.   LABS:  Results for orders placed or performed in visit on 07/26/15  POCT glucose (manual entry)  Result Value Ref Range   POC Glucose 109 (A) 70 - 99 mg/dl  POCT glycosylated hemoglobin (Hb A1C)  Result Value Ref Range   Hemoglobin A1C 5.9     EKG/XRAY:   Primary read interpreted by Dr. Everlene Farrier at [facility].   ASSESSMENT/PLAN: 1. Annual physical exam Physical exam is normal except for his COPD.  2. History of COPD No change in medication - CT Chest Wo Contrast; Future  3. Neck pain Mild  worsening recently but better again now.  4. Type 2 diabetes mellitus without complication, without long-term current use of insulin (HCC) Hemoglobin A1c 5.9. - CBC with Differential/Platelet - COMPLETE METABOLIC PANEL WITH GFR - POCT glucose (manual entry) - POCT glycosylated hemoglobin (Hb A1C) - Microalbumin, urine  5. Prostate cancer [facility]) This is followed closely by Dr. Gaynelle Arabian.  6. Ex-cigarette smoker  - CT Chest Wo Contrast; Future  7. Hyperlipidemia  - Lipid panel   I personally performed the services described in this documentation, which was scribed in my presence. The recorded information has been reviewed and is accurate.  [name], MD  [facility], [facility]  07/26/2015 9:16 AM   Gross sideeffects, risk and benefits, and alternatives of medications d/w patient. Patient is aware that all medications have potential sideeffects and we are unable to predict every sideeffect or drug-drug interaction that may occur.  [name] MD 07/26/2015 8:38 AM

## 2015-08-01 ENCOUNTER — Other Ambulatory Visit: Payer: Self-pay | Admitting: Emergency Medicine

## 2015-08-01 DIAGNOSIS — R059 Cough, unspecified: Secondary | ICD-10-CM

## 2015-08-01 DIAGNOSIS — Z8709 Personal history of other diseases of the respiratory system: Secondary | ICD-10-CM

## 2015-08-01 DIAGNOSIS — R05 Cough: Secondary | ICD-10-CM

## 2015-08-01 DIAGNOSIS — Z87891 Personal history of nicotine dependence: Secondary | ICD-10-CM

## 2015-08-02 ENCOUNTER — Ambulatory Visit
Admission: RE | Admit: 2015-08-02 | Discharge: 2015-08-02 | Disposition: A | Discharge status: Home / Self Care | Payer: PRIVATE HEALTH INSURANCE | Source: Ambulatory Visit | Attending: Emergency Medicine | Admitting: Emergency Medicine

## 2015-08-02 DIAGNOSIS — Z8709 Personal history of other diseases of the respiratory system: Secondary | ICD-10-CM

## 2015-08-02 DIAGNOSIS — R059 Cough, unspecified: Secondary | ICD-10-CM

## 2015-08-02 DIAGNOSIS — Z87891 Personal history of nicotine dependence: Secondary | ICD-10-CM

## 2015-08-02 DIAGNOSIS — R05 Cough: Secondary | ICD-10-CM

## 2015-08-09 ENCOUNTER — Other Ambulatory Visit: Payer: Self-pay | Admitting: Emergency Medicine

## 2015-08-10 NOTE — Telephone Encounter (Signed)
[phone]  Following up on surescript request for inhaller.

## 2015-09-27 ENCOUNTER — Other Ambulatory Visit: Payer: Self-pay | Admitting: Emergency Medicine

## 2015-09-27 ENCOUNTER — Telehealth: Payer: Self-pay | Admitting: Emergency Medicine

## 2015-09-27 MED ORDER — DOXYCYCLINE HYCLATE 100 MG PO TABS: 100.0000 mg | ORAL_TABLET | Freq: Two times a day (BID) | ORAL | Status: DC

## 2015-09-27 NOTE — Telephone Encounter (Signed)
Patient up in [place] now has a productive cough. A prescription for doxycycline was sent to [facility] in [place] file #[phone].

## 2016-01-16 ENCOUNTER — Other Ambulatory Visit: Payer: Self-pay | Admitting: Gastroenterology

## 2016-01-16 DIAGNOSIS — B182 Chronic viral hepatitis C: Secondary | ICD-10-CM

## 2016-01-24 ENCOUNTER — Encounter: Payer: Self-pay | Admitting: Emergency Medicine

## 2016-01-24 ENCOUNTER — Ambulatory Visit (INDEPENDENT_AMBULATORY_CARE_PROVIDER_SITE_OTHER): Payer: PRIVATE HEALTH INSURANCE | Admitting: Emergency Medicine

## 2016-01-24 VITALS — BP 120/70 | HR 58 | Temp 97.5°F | Resp 16 | Ht 70.75 in | Wt 165.5 lb

## 2016-01-24 DIAGNOSIS — Z23 Encounter for immunization: Secondary | ICD-10-CM | POA: Diagnosis not present

## 2016-01-24 DIAGNOSIS — E119 Type 2 diabetes mellitus without complications: Secondary | ICD-10-CM

## 2016-01-24 DIAGNOSIS — E785 Hyperlipidemia, unspecified: Secondary | ICD-10-CM | POA: Diagnosis not present

## 2016-01-24 DIAGNOSIS — Z8709 Personal history of other diseases of the respiratory system: Secondary | ICD-10-CM

## 2016-01-24 DIAGNOSIS — I77819 Aortic ectasia, unspecified site: Secondary | ICD-10-CM

## 2016-01-24 DIAGNOSIS — Z87891 Personal history of nicotine dependence: Secondary | ICD-10-CM | POA: Diagnosis not present

## 2016-01-24 LAB — POCT CBC
Granulocyte percent: 53.6 %G (ref 37–80)
HCT, POC: 47.2 % (ref 43.5–53.7)
Hemoglobin: 16.6 g/dL (ref 14.1–18.1)
Lymph, poc: 2.9 (ref 0.6–3.4)
MCH, POC: 31.4 pg — AB (ref 27–31.2)
MCHC: 35.2 g/dL (ref 31.8–35.4)
MCV: 89.2 fL (ref 80–97)
MID (CBC): 1.1 — AB (ref 0–0.9)
MPV: 7.4 fL (ref 0–99.8)
POC Granulocyte: 4.6 (ref 2–6.9)
POC LYMPH PERCENT: 34.1 %L (ref 10–50)
POC MID %: 12.3 % — AB (ref 0–12)
Platelet Count, POC: 229 10*3/uL (ref 142–424)
RBC: 5.29 M/uL (ref 4.69–6.13)
RDW, POC: 13.2 %
WBC: 8.6 10*3/uL (ref 4.6–10.2)

## 2016-01-24 LAB — BASIC METABOLIC PANEL WITH GFR
BUN: 23 mg/dL (ref 7–25)
CHLORIDE: 106 mmol/L (ref 98–110)
CO2: 24 mmol/L (ref 20–31)
Calcium: 9.4 mg/dL (ref 8.6–10.3)
Creat: 0.84 mg/dL (ref 0.70–1.25)
GLUCOSE: 104 mg/dL — AB (ref 65–99)
POTASSIUM: 4.6 mmol/L (ref 3.5–5.3)
Sodium: 139 mmol/L (ref 135–146)

## 2016-01-24 LAB — LIPID PANEL
CHOLESTEROL: 208 mg/dL — AB (ref 125–200)
HDL: 33 mg/dL — ABNORMAL LOW (ref >=40)
LDL Cholesterol: 142 mg/dL — ABNORMAL HIGH (ref <130)
Total CHOL/HDL Ratio: 6.3 Ratio — ABNORMAL HIGH (ref <=5.0)
Triglycerides: 166 mg/dL — ABNORMAL HIGH (ref <150)
VLDL: 33 mg/dL — AB (ref <30)

## 2016-01-24 LAB — GLUCOSE, POCT (MANUAL RESULT ENTRY): POC GLUCOSE: 95 mg/dL (ref 70–99)

## 2016-01-24 LAB — POCT GLYCOSYLATED HEMOGLOBIN (HGB A1C): Hemoglobin A1C: 5.8

## 2016-01-24 NOTE — Patient Instructions (Signed)
     IF you received an x-ray today, you will receive an invoice from [facility]. Please contact [facility] at [phone] with questions or concerns regarding your invoice.   IF you received labwork today, you will receive an invoice from [facility]. Please contact [facility] at [phone] with questions or concerns regarding your invoice.   Our billing staff will not be able to assist you with questions regarding bills from these companies.  You will be contacted with the lab results as soon as they are available. The fastest way to get your results is to activate your My Chart account. Instructions are located on the last page of this paperwork. If you have not heard from us regarding the results in 2 weeks, please contact this office.      

## 2016-01-24 NOTE — Progress Notes (Signed)
Patient ID: George Watson, male   DOB: 11/09/1945, 70 y.o.   MRN: [id number]    By signing my name below, I, [name], attest that this documentation has been prepared under the direction and in the presence of Darlyne Russian, MD Electronically Signed: Ladene Artist, ED Scribe 01/24/2016 at 9:08 AM.  Chief Complaint:  Chief Complaint  Patient presents with  . Follow-up    BLOOD SUGAR   HPI: George Watson is a 70 y.o. male who reports to [facility] today for a follow-up on Diabetes. Pt is followed by his eye doctor annually; states he has cataract in his right eye.   Urology  Pt recently received a call from his urologist for a prostate MRI. He is considering radiation for his low-grade prostate CA. He states that he had 30% in 1 sector when he had a biopsy done without an increase in his PSA. Pt currently takes OTC medications like Aleve PM and Tylenol PM to assist with sleeping for at least 4 hours at a time. Without medication, he wakes every hour with urinary frequency. He has a follow-up appointment with urology in August.    Vaccines  Pt plans to return in 2 weeks to get his PNA vaccination since he is going to the beach in 2 days.   Past Medical History  Diagnosis Date  . Hepatitis C 10/1996  . H/O drug abuse     multisubstance  . Other and unspecified hyperlipidemia   . HSV-2 (herpes simplex virus 2) infection   . Elevated PSA   . COPD (chronic obstructive pulmonary disease) ([place])   . BPH (benign prostatic hypertrophy)   . Inguinal hernia     right  . Hx of biopsy 08/2004    liver  . DDD (degenerative disc disease), lumbosacral   . Essential hypertension, benign     diet controlled  . GERD (gastroesophageal reflux disease)   . Prostate cancer ([place])   . Fracture of shaft of clavicle   . Substance abuse   . Tuberculosis    Past Surgical History  Procedure Laterality Date  . Fracture ribs      3  . Cervical spine surgery    . Right shoulder arthroscopy  2011  .  Right rotator cuff  1992  . Left knee meniscus repair  1992  . Inguinal hernia repair  07/01/2012    Procedure: HERNIA REPAIR INGUINAL ADULT;  Surgeon: Madilyn Hook, DO;  Location: [facility];  Service: General;  Laterality: Right;  with Mesh  . Hernia repair     Social History   Social History  . Marital Status: Married    Spouse Name: N/A  . Number of Children: N/A  . Years of Education: N/A   Occupational History  . Driver    Social History Main Topics  . Smoking status: Former Smoker -- 43 years    Types: Cigarettes    Quit date: 07/21/2002  . Smokeless tobacco: Never Used  . Alcohol Use: No  . Drug Use: No     Comment: 26 years ago cocaine, heroin. methadone  . Sexual Activity: Yes     Comment: number of sex partners in the last 10 months  1   Other Topics Concern  . Not on file   Social History Narrative      Married to [employer]. Exercise cardio daily for 30 minutes. Education: [employer].   Family History  Problem Relation Age of Onset  . Cancer  Sister     breast  . Leukemia Mother   . Cancer Father    No Known Allergies Prior to Admission medications   Medication Sig Start Date End Date Taking? Authorizing Provider  doxycycline (VIBRA-TABS) 100 MG tablet Take 1 tablet (100 mg total) by mouth 2 (two) times daily. 09/27/15   Darlyne Russian, MD  dutasteride (AVODART) 0.5 MG capsule Take 0.5 mg by mouth daily.    Historical Provider, MD  Glucosamine-Chondroit-Vit C-Mn (GLUCOSAMINE 1500 COMPLEX PO) Take 2 tablets by mouth daily with breakfast.     Historical Provider, MD  metaxalone (SKELAXIN) 800 MG tablet TAKE 1 TABLET (800 MG TOTAL) BY MOUTH 3 (THREE) TIMES DAILY AS NEEDED FOR BACK PAIN. 09/25/14   Darlyne Russian, MD  Multiple Vitamin (MULTIVITAMIN) tablet Take 1 tablet by mouth daily.    Historical Provider, MD  mupirocin ointment (BACTROBAN) 2 % Applied to the irritated area twice a day 01/24/14   Darlyne Russian, MD  Omega-3 Fatty Acids (FISH OIL) 1000 MG CAPS Take 1  capsule by mouth 3 (three) times daily.     Historical Provider, MD  PROAIR HFA 108 (90 BASE) MCG/ACT inhaler INHALE 2 PUFFS INTO THE LUNGS EVERY 6 (SIX) HOURS AS NEEDED FOR SHORTNESS OF BREATH. 01/11/15   Darlyne Russian, MD  QVAR 80 MCG/ACT inhaler INHALE TWO PUFFS TWICE DAILY AS DIRECTED 08/12/15   Darlyne Russian, MD  Tamsulosin HCl (FLOMAX) 0.4 MG CAPS Take 0.4 mg by mouth daily after breakfast.    Historical Provider, MD  tiotropium (SPIRIVA HANDIHALER) 18 MCG inhalation capsule Once at night 01/23/15   Darlyne Russian, MD   ROS: The patient denies fevers, chills, night sweats, unintentional weight loss, chest pain, palpitations, wheezing, dyspnea on exertion, nausea, vomiting, abdominal pain, dysuria, hematuria, melena, numbness, weakness, or tingling.  All other systems have been reviewed and were otherwise negative with the exception of those mentioned in the HPI and as above.    PHYSICAL EXAM: Filed Vitals:   01/24/16 0817  BP: 120/70  Pulse: 58  Temp: 97.5 F (36.4 C)  Resp: 16   Body mass index is 23.25 kg/(m^2).  General: Alert, no acute distress HEENT:  Normocephalic, atraumatic, oropharynx patent.  Eye: [facility]. Cataract in R eye Cardiovascular:  Regular rate and rhythm, no rubs murmurs or gallops. No Carotid bruits, radial pulse intact. No pedal edema.  Respiratory: Dry rales in both bases. Abdominal: No organomegaly, abdomen is soft and non-tender, positive bowel sounds. No masses. Musculoskeletal: Gait intact. No edema, tenderness Skin: No rashes. Neurologic: Facial musculature symmetric. Psychiatric: Patient acts appropriately throughout our interaction. Lymphatic: No cervical or submandibular lymphadenopathy  LABS: Results for orders placed or performed in visit on 01/24/16  POCT glucose (manual entry)  Result Value Ref Range   POC Glucose 95 70 - 99 mg/dl  POCT glycosylated hemoglobin (Hb A1C)  Result Value Ref Range   Hemoglobin A1C 5.8   POCT CBC  Result  Value Ref Range   WBC 8.6 4.6 - 10.2 K/uL   Lymph, poc 2.9 0.6 - 3.4   POC LYMPH PERCENT 34.1 10 - 50 %L   MID (cbc) 1.1 (A) 0 - 0.9   POC MID % 12.3 (A) 0 - 12 %M   POC Granulocyte 4.6 2 - 6.9   Granulocyte percent 53.6 37 - 80 %G   RBC 5.29 4.69 - 6.13 M/uL   Hemoglobin 16.6 14.1 - 18.1 g/dL   HCT, POC 47.2 43.5 -  53.7 %   MCV 89.2 80 - 97 fL   MCH, POC 31.4 (A) 27 - 31.2 pg   MCHC 35.2 31.8 - 35.4 g/dL   RDW, POC 13.2 %   Platelet Count, POC 229 142 - 424 K/uL   MPV 7.4 0 - 99.8 fL   EKG/XRAY:   Primary read interpreted by Dr. Everlene Farrier at [facility].  ASSESSMENT/PLAN:Patient doing great. He will discuss with his urologist regarding further management of his prostate cancer. He will follow-up with Dr. Gaynelle Arabian. He will return in a few weeks to get his booster) low 23. Otherwise he is up-to-date and has a follow-up appointment with [name] regarding his hep C and colon screening.I personally performed the services described in this documentation, which was scribed in my presence. The recorded information has been reviewed and is accurate.    Gross sideeffects, risk and benefits, and alternatives of medications d/w patient. Patient is aware that all medications have potential sideeffects and we are unable to predict every sideeffect or drug-drug interaction that may occur.  [name] MD 01/24/2016 8:17 AM

## 2016-01-25 ENCOUNTER — Telehealth: Payer: Self-pay

## 2016-01-25 MED ORDER — MUPIROCIN 2 % EX OINT: TOPICAL_OINTMENT | CUTANEOUS | Status: DC

## 2016-01-25 NOTE — Addendum Note (Signed)
Addended by: [name] A on: 01/25/2016 05:35 PM   Modules accepted: Orders

## 2016-01-25 NOTE — Telephone Encounter (Signed)
Pt would like a refill on his mupirocin ointment (BACTROBAN) 2 % [id number]. He was here yesterday and saw Dr. Everlene Farrier. He said they dicussed this issue.Pharmacy:  [facility], [place]. CB # [phone]

## 2016-01-27 ENCOUNTER — Other Ambulatory Visit: Payer: Self-pay | Admitting: Emergency Medicine

## 2016-01-29 NOTE — Telephone Encounter (Signed)
This Rx was sent in on 7/7. LMOM for pt to advise.

## 2016-02-01 ENCOUNTER — Telehealth: Payer: Self-pay

## 2016-02-01 NOTE — Telephone Encounter (Signed)
Spoke with pt.  Gave message per Dr. Everlene Farrier.  Pt states he is careful with diet, except probably eats too much red meat.  States he exercises 1 hour daily.

## 2016-02-01 NOTE — Telephone Encounter (Signed)
-----   Message from Darlyne Russian, MD sent at 01/24/2016  3:56 PM EDT ----- Labs are good except his lipids are still elevated with a cholesterol of 208 triglycerides 106 and HDL of 33.

## 2016-02-04 ENCOUNTER — Other Ambulatory Visit: Payer: PRIVATE HEALTH INSURANCE

## 2016-02-05 ENCOUNTER — Ambulatory Visit
Admission: RE | Admit: 2016-02-05 | Discharge: 2016-02-05 | Disposition: A | Discharge status: Home / Self Care | Payer: PRIVATE HEALTH INSURANCE | Source: Ambulatory Visit | Attending: Gastroenterology | Admitting: Gastroenterology

## 2016-02-05 DIAGNOSIS — B182 Chronic viral hepatitis C: Secondary | ICD-10-CM

## 2016-04-11 ENCOUNTER — Encounter: Payer: Self-pay | Admitting: Emergency Medicine

## 2016-04-14 ENCOUNTER — Other Ambulatory Visit: Payer: Self-pay | Admitting: Emergency Medicine

## 2016-04-30 ENCOUNTER — Other Ambulatory Visit: Payer: Self-pay | Admitting: Emergency Medicine

## 2016-04-30 MED ORDER — ALBUTEROL SULFATE HFA 108 (90 BASE) MCG/ACT IN AERS: 2.0000 | INHALATION_SPRAY | Freq: Four times a day (QID) | RESPIRATORY_TRACT | 0 refills | Status: DC | PRN

## 2016-05-26 ENCOUNTER — Other Ambulatory Visit: Payer: Self-pay | Admitting: Emergency Medicine

## 2016-06-20 ENCOUNTER — Ambulatory Visit (HOSPITAL_COMMUNITY)
Admission: EM | Admit: 2016-06-20 | Discharge: 2016-06-20 | Disposition: A | Discharge status: Home / Self Care | Payer: PRIVATE HEALTH INSURANCE | Attending: Family Medicine | Admitting: Family Medicine

## 2016-06-20 ENCOUNTER — Encounter (HOSPITAL_COMMUNITY): Payer: Self-pay

## 2016-06-20 DIAGNOSIS — J441 Chronic obstructive pulmonary disease with (acute) exacerbation: Secondary | ICD-10-CM | POA: Diagnosis not present

## 2016-06-20 MED ORDER — PREDNISONE 10 MG (21) PO TBPK
10.0000 mg | ORAL_TABLET | Freq: Every day | ORAL | 0 refills | Status: DC
Start: 2016-06-20 — End: 2016-07-08

## 2016-06-20 MED ORDER — AMOXICILLIN 875 MG PO TABS: 875.0000 mg | ORAL_TABLET | Freq: Two times a day (BID) | ORAL | 0 refills | Status: DC

## 2016-06-20 NOTE — ED Triage Notes (Signed)
Pt has emphysema and COPD and went to [facility] and was given prednisone and a neb treatment. But still not getting better. Having SOB, cough, congestion since Sunday. No fever. 2 Days left of the prednisone.

## 2016-06-20 NOTE — ED Provider Notes (Signed)
CSN: [id number]     Arrival date & time 06/20/16  1333 History   None    Chief Complaint  Patient presents with  . URI   (Consider location/radiation/quality/duration/timing/severity/associated sxs/prior Treatment) Patient c/o cough, wheezing, and SOB.  He has hx of COPD   The history is provided by the patient.  URI  Presenting symptoms: cough   Severity:  Moderate Onset quality:  Sudden Duration:  5 days Timing:  Constant Progression:  Worsening Chronicity:  New Relieved by:  Nothing Worsened by:  Nothing Ineffective treatments:  Nebulizer treatments and prescription medications Associated symptoms: wheezing     Past Medical History:  Diagnosis Date  . BPH (benign prostatic hypertrophy)   . COPD (chronic obstructive pulmonary disease) ([place])   . DDD (degenerative disc disease), lumbosacral   . Elevated PSA   . Essential hypertension, benign    diet controlled  . Fracture of shaft of clavicle   . GERD (gastroesophageal reflux disease)   . H/O drug abuse    multisubstance  . Hepatitis C 10/1996  . HSV-2 (herpes simplex virus 2) infection   . Hx of biopsy 08/2004   liver  . Inguinal hernia    right  . Other and unspecified hyperlipidemia   . Prostate cancer ([place])   . Substance abuse   . Tuberculosis    Past Surgical History:  Procedure Laterality Date  . CERVICAL SPINE SURGERY    . fracture ribs     3  . HERNIA REPAIR    . INGUINAL HERNIA REPAIR  07/01/2012   Procedure: HERNIA REPAIR INGUINAL ADULT;  Surgeon: Madilyn Hook, DO;  Location: [facility];  Service: General;  Laterality: Right;  with Mesh  . left knee meniscus repair  1992  . right rotator cuff  1992  . right shoulder arthroscopy  2011   Family History  Problem Relation Age of Onset  . Cancer Sister     breast  . Leukemia Mother   . Cancer Father    Social History  Substance Use Topics  . Smoking status: Former Smoker    Years: 43.00    Types: Cigarettes    Quit date: 07/21/2002  . Smokeless  tobacco: Never Used  . Alcohol use No    Review of Systems  Constitutional: Negative.   HENT: Negative.   Eyes: Negative.   Respiratory: Positive for cough and wheezing.   Gastrointestinal: Negative.   Endocrine: Negative.   Genitourinary: Negative.   Musculoskeletal: Negative.   Skin: Negative.   Allergic/Immunologic: Negative.   Neurological: Negative.   Hematological: Negative.   Psychiatric/Behavioral: Negative.     Allergies  Patient has no known allergies.  Home Medications   Prior to Admission medications   Medication Sig Start Date End Date Taking? Authorizing Provider  albuterol (PROAIR HFA) 108 (90 Base) MCG/ACT inhaler Inhale 2 puffs into the lungs every 6 (six) hours as needed for wheezing or shortness of breath. 04/30/16  Yes Darlyne Russian, MD  dutasteride (AVODART) 0.5 MG capsule Take 0.5 mg by mouth daily.   Yes Historical Provider, MD  Glucosamine-Chondroit-Vit C-Mn (GLUCOSAMINE 1500 COMPLEX PO) Take 2 tablets by mouth daily with breakfast.    Yes Historical Provider, MD  Multiple Vitamin (MULTIVITAMIN) tablet Take 1 tablet by mouth daily.   Yes Historical Provider, MD  Omega-3 Fatty Acids (FISH OIL) 1000 MG CAPS Take 1 capsule by mouth 3 (three) times daily.    Yes Historical Provider, MD  QVAR 80 MCG/ACT inhaler INHALE TWO  PUFFS TWICE DAILY AS DIRECTED 05/28/16  Yes Jaynee Eagles, PA-C  SPIRIVA HANDIHALER 18 MCG inhalation capsule INHALE ONE CAPSULE ONCE AT NIGHT 01/28/16  Yes Darlyne Russian, MD  Tamsulosin HCl (FLOMAX) 0.4 MG CAPS Take 0.4 mg by mouth daily after breakfast.   Yes Historical Provider, MD  amoxicillin (AMOXIL) 875 MG tablet Take 1 tablet (875 mg total) by mouth 2 (two) times daily. 06/20/16   Lysbeth Penner, FNP  metaxalone (SKELAXIN) 800 MG tablet TAKE 1 TABLET (800 MG TOTAL) BY MOUTH 3 (THREE) TIMES DAILY AS NEEDED FOR BACK PAIN. 09/25/14   Darlyne Russian, MD  mupirocin ointment (BACTROBAN) 2 % Applied to the irritated area twice a day 01/25/16   Darlyne Russian, MD  predniSONE (STERAPRED UNI-PAK 21 TAB) 10 MG (21) TBPK tablet Take 1 tablet (10 mg total) by mouth daily. Take 4 po qd x 2d then 3 po qd x 2d then 2 po qd x 2d then 1 po qd x 2d then stop 06/20/16   Lysbeth Penner, FNP   Meds Ordered and Administered this Visit  Medications - No data to display  BP 139/64 (BP Location: Left Arm)   Pulse 87   Temp 98.3 F (36.8 C) (Oral)   Resp 16   Ht 5\' 11"  (1.803 m)   SpO2 94%  No data found.   Physical Exam  Constitutional: He appears well-developed and well-nourished.  HENT:  Head: Normocephalic and atraumatic.  Right Ear: External ear normal.  Left Ear: External ear normal.  Mouth/Throat: Oropharynx is clear and moist.  Eyes: Conjunctivae and EOM are normal. Pupils are equal, round, and reactive to light.  Neck: Normal range of motion. Neck supple.  Cardiovascular: Normal rate, regular rhythm and normal heart sounds.   Pulmonary/Chest: Effort normal. He has wheezes.  Abdominal: Soft. Bowel sounds are normal.  Nursing note and vitals reviewed.   Urgent Care Course   Clinical Course     Procedures (including critical care time)  Labs Review Labs Reviewed - No data to display  Imaging Review No results found.   Visual Acuity Review  Right Eye Distance:   Left Eye Distance:   Bilateral Distance:    Right Eye Near:   Left Eye Near:    Bilateral Near:         MDM   1. COPD exacerbation (HCC)    Amoxicillin 875mg  one po bid x 10 days #20 Prednisone 10mg  4po qd x 2d then 3po qdx 2d then 2po qdx 2d then 1po qd x 2d then stop #20 Continue SABA MDI and nebulizer Push po fluids, rest, tylenol and motrin otc prn as directed for fever, arthralgias, and myalgias.  Follow up prn if sx's continue or persist.    Lysbeth Penner, FNP 06/20/16 1530

## 2016-07-06 ENCOUNTER — Other Ambulatory Visit: Payer: Self-pay | Admitting: Emergency Medicine

## 2016-07-06 ENCOUNTER — Other Ambulatory Visit: Payer: Self-pay | Admitting: Urgent Care

## 2016-07-08 ENCOUNTER — Ambulatory Visit (INDEPENDENT_AMBULATORY_CARE_PROVIDER_SITE_OTHER): Payer: PRIVATE HEALTH INSURANCE | Admitting: Internal Medicine

## 2016-07-08 ENCOUNTER — Encounter: Payer: Self-pay | Admitting: Internal Medicine

## 2016-07-08 VITALS — BP 140/80 | HR 77 | Temp 97.8°F | Resp 16 | Ht 71.0 in | Wt 171.1 lb

## 2016-07-08 DIAGNOSIS — I700 Atherosclerosis of aorta: Secondary | ICD-10-CM

## 2016-07-08 DIAGNOSIS — J449 Chronic obstructive pulmonary disease, unspecified: Secondary | ICD-10-CM | POA: Diagnosis not present

## 2016-07-08 DIAGNOSIS — E785 Hyperlipidemia, unspecified: Secondary | ICD-10-CM

## 2016-07-08 DIAGNOSIS — Z23 Encounter for immunization: Secondary | ICD-10-CM | POA: Diagnosis not present

## 2016-07-08 MED ORDER — ATORVASTATIN CALCIUM 20 MG PO TABS: 20.0000 mg | ORAL_TABLET | Freq: Every day | ORAL | 3 refills | Status: DC

## 2016-07-08 MED ORDER — FLUTICASONE-UMECLIDIN-VILANT 100-62.5-25 MCG/INH IN AEPB: 1.0000 | INHALATION_SPRAY | Freq: Every day | RESPIRATORY_TRACT | 11 refills | Status: DC

## 2016-07-08 MED ORDER — ASPIRIN EC 81 MG PO TBEC: 81.0000 mg | DELAYED_RELEASE_TABLET | Freq: Every day | ORAL | 3 refills | Status: DC

## 2016-07-08 NOTE — Progress Notes (Signed)
Pre visit review using our clinic review tool, if applicable. No additional management support is needed unless otherwise documented below in the visit note. 

## 2016-07-08 NOTE — Patient Instructions (Signed)
Chronic Obstructive Pulmonary Disease Chronic obstructive pulmonary disease (COPD) is a common lung condition in which airflow from the lungs is limited. COPD is a general term that can be used to describe many different lung problems that limit airflow, including both chronic bronchitis and emphysema. If you have COPD, your lung function will probably never return to normal, but there are measures you can take to improve lung function and make yourself feel better. What are the causes?  Smoking (common).  Exposure to secondhand smoke.  Genetic problems.  Chronic inflammatory lung diseases or recurrent infections. What are the signs or symptoms?  Shortness of breath, especially with physical activity.  Deep, persistent (chronic) cough with a large amount of thick mucus.  Wheezing.  Rapid breaths (tachypnea).  Gray or bluish discoloration (cyanosis) of the skin, especially in your fingers, toes, or lips.  Fatigue.  Weight loss.  Frequent infections or episodes when breathing symptoms become much worse (exacerbations).  Chest tightness. How is this diagnosed? Your health care provider will take a medical history and perform a physical examination to diagnose COPD. Additional tests for COPD may include:  Lung (pulmonary) function tests.  Chest X-ray.  CT scan.  Blood tests. How is this treated? Treatment for COPD may include:  Inhaler and nebulizer medicines. These help manage the symptoms of COPD and make your breathing more comfortable.  Supplemental oxygen. Supplemental oxygen is only helpful if you have a low oxygen level in your blood.  Exercise and physical activity. These are beneficial for nearly all people with COPD.  Lung surgery or transplant.  Nutrition therapy to gain weight, if you are underweight.  Pulmonary rehabilitation. This may involve working with a team of health care providers and specialists, such as respiratory, occupational, and physical  therapists. Follow these instructions at home:  Take all medicines (inhaled or pills) as directed by your health care provider.  Avoid over-the-counter medicines or cough syrups that dry up your airway (such as antihistamines) and slow down the elimination of secretions unless instructed otherwise by your health care provider.  If you are a smoker, the most important thing that you can do is stop smoking. Continuing to smoke will cause further lung damage and breathing trouble. Ask your health care provider for help with quitting smoking. He or she can direct you to community resources or hospitals that provide support.  Avoid exposure to irritants such as smoke, chemicals, and fumes that aggravate your breathing.  Use oxygen therapy and pulmonary rehabilitation if directed by your health care provider. If you require home oxygen therapy, ask your health care provider whether you should purchase a pulse oximeter to measure your oxygen level at home.  Avoid contact with individuals who have a contagious illness.  Avoid extreme temperature and humidity changes.  Eat healthy foods. Eating smaller, more frequent meals and resting before meals may help you maintain your strength.  Stay active, but balance activity with periods of rest. Exercise and physical activity will help you maintain your ability to do things you want to do.  Preventing infection and hospitalization is very important when you have COPD. Make sure to receive all the vaccines your health care provider recommends, especially the pneumococcal and influenza vaccines. Ask your health care provider whether you need a pneumonia vaccine.  Learn and use relaxation techniques to manage stress.  Learn and use controlled breathing techniques as directed by your health care provider. Controlled breathing techniques include: 1. Pursed lip breathing. Start by breathing in (inhaling)   through your nose for 1 second. Then, purse your lips as  if you were going to whistle and breathe out (exhale) through the pursed lips for 2 seconds. 2. Diaphragmatic breathing. Start by putting one hand on your abdomen just above your waist. Inhale slowly through your nose. The hand on your abdomen should move out. Then purse your lips and exhale slowly. You should be able to feel the hand on your abdomen moving in as you exhale.  Learn and use controlled coughing to clear mucus from your lungs. Controlled coughing is a series of short, progressive coughs. The steps of controlled coughing are: 1. Lean your head slightly forward. 2. Breathe in deeply using diaphragmatic breathing. 3. Try to hold your breath for 3 seconds. 4. Keep your mouth slightly open while coughing twice. 5. Spit any mucus out into a tissue. 6. Rest and repeat the steps once or twice as needed. Contact a health care provider if:  You are coughing up more mucus than usual.  There is a change in the color or thickness of your mucus.  Your breathing is more labored than usual.  Your breathing is faster than usual. Get help right away if:  You have shortness of breath while you are resting.  You have shortness of breath that prevents you from:  Being able to talk.  Performing your usual physical activities.  You have chest pain lasting longer than 5 minutes.  Your skin color is more cyanotic than usual.  You measure low oxygen saturations for longer than 5 minutes with a pulse oximeter. This information is not intended to replace advice given to you by your health care provider. Make sure you discuss any questions you have with your health care provider. Document Released: 04/16/2005 Document Revised: 12/13/2015 Document Reviewed: 03/03/2013 Elsevier Interactive Patient Education  2017 [employer] Inc[employer]  

## 2016-07-08 NOTE — Progress Notes (Signed)
Subjective:  Patient ID: George Watson, male    DOB: 1945/10/24  Age: 70 y.o. MRN: [id number]  CC: COPD  New to me  HPI George Watson presents for f/up after a recent COPD exacerbation, He was treated with antibiotics and systemic steroids and says that he is feeling much better. His cough is only productive of clear phlegm and his wheezing and shortness of breath are improving. He denies chest pain, hemoptysis, night sweats, fever, chills. He is currently using 3 different inhalers to control his symptoms.  History Gillis has a past medical history of BPH (benign prostatic hypertrophy); COPD (chronic obstructive pulmonary disease) ([place]); DDD (degenerative disc disease), lumbosacral; Elevated PSA; Essential hypertension, benign; Fracture of shaft of clavicle; GERD (gastroesophageal reflux disease); H/O drug abuse; Hepatitis C (10/1996); HSV-2 (herpes simplex virus 2) infection; biopsy (08/2004); Inguinal hernia; Other and unspecified hyperlipidemia; Prostate cancer ([place]); Substance abuse; and Tuberculosis.   He has a past surgical history that includes fracture ribs; Cervical spine surgery; right shoulder arthroscopy (2011); right rotator cuff (1992); left knee meniscus repair (1992); Inguinal hernia repair (07/01/2012); and Hernia repair.   His family history includes Cancer in his father and sister; Leukemia in his mother.He reports that he quit smoking about 13 years ago. His smoking use included Cigarettes. He quit after 43.00 years of use. He has never used smokeless tobacco. He reports that he does not drink alcohol or use drugs.  Outpatient Medications Prior to Visit  Medication Sig Dispense Refill  . albuterol (PROAIR HFA) 108 (90 Base) MCG/ACT inhaler Inhale 2 puffs into the lungs every 6 (six) hours as needed for wheezing or shortness of breath. 8.5 each 0  . dutasteride (AVODART) 0.5 MG capsule Take 0.5 mg by mouth daily.    . mupirocin ointment (BACTROBAN) 2 % Applied to the  irritated area twice a day 22 g 3  . Omega-3 Fatty Acids (FISH OIL) 1000 MG CAPS Take 1 capsule by mouth 3 (three) times daily.     . Tamsulosin HCl (FLOMAX) 0.4 MG CAPS Take 0.4 mg by mouth daily after breakfast.    . QVAR 80 MCG/ACT inhaler INHALE TWO PUFFS TWICE DAILY AS DIRECTED 8.7 g 0  . SPIRIVA HANDIHALER 18 MCG inhalation capsule INHALE ONE CAPSULE ONCE AT NIGHT 90 capsule 1  . amoxicillin (AMOXIL) 875 MG tablet Take 1 tablet (875 mg total) by mouth 2 (two) times daily. 20 tablet 0  . Glucosamine-Chondroit-Vit C-Mn (GLUCOSAMINE 1500 COMPLEX PO) Take 2 tablets by mouth daily with breakfast.     . metaxalone (SKELAXIN) 800 MG tablet TAKE 1 TABLET (800 MG TOTAL) BY MOUTH 3 (THREE) TIMES DAILY AS NEEDED FOR BACK PAIN. 90 tablet 5  . Multiple Vitamin (MULTIVITAMIN) tablet Take 1 tablet by mouth daily.    . predniSONE (STERAPRED UNI-PAK 21 TAB) 10 MG (21) TBPK tablet Take 1 tablet (10 mg total) by mouth daily. Take 4 po qd x 2d then 3 po qd x 2d then 2 po qd x 2d then 1 po qd x 2d then stop 20 tablet 0   No facility-administered medications prior to visit.     ROS Review of Systems  Constitutional: Negative for appetite change, chills, diaphoresis, fatigue and fever.  HENT: Negative.  Negative for facial swelling, sinus pressure, trouble swallowing and voice change.   Eyes: Negative for visual disturbance.  Respiratory: Positive for cough, shortness of breath and wheezing. Negative for choking, chest tightness and stridor.   Cardiovascular: Negative.  Negative  for chest pain, palpitations and leg swelling.  Gastrointestinal: Negative for abdominal pain, constipation, diarrhea, nausea and vomiting.  Genitourinary: Negative.  Negative for difficulty urinating.  Musculoskeletal: Negative.  Negative for arthralgias, back pain, myalgias and neck pain.  Skin: Negative.  Negative for color change and rash.  Allergic/Immunologic: Negative.   Neurological: Negative.   Hematological: Negative.   Negative for adenopathy. Does not bruise/bleed easily.  Psychiatric/Behavioral: Negative.     Objective:  BP 140/80 (BP Location: Left Arm, Patient Position: Sitting, Cuff Size: Normal)   Pulse 77   Temp 97.8 F (36.6 C) (Oral)   Resp 16   Ht 5\' 11"  (1.803 m)   Wt 171 lb 2 oz (77.6 kg)   SpO2 96%   BMI 23.87 kg/m   Physical Exam  Constitutional: He is oriented to person, place, and time.  Non-toxic appearance. He does not have a sickly appearance. He does not appear ill. No distress.  HENT:  Mouth/Throat: Oropharynx is clear and moist. No oropharyngeal exudate.  Eyes: Conjunctivae are normal. Right eye exhibits no discharge. Left eye exhibits no discharge. No scleral icterus.  Neck: Normal range of motion. Neck supple. No JVD present. No tracheal deviation present. No thyromegaly present.  Cardiovascular: Normal rate, regular rhythm, normal heart sounds and intact distal pulses.  Exam reveals no gallop and no friction rub.   No murmur heard. Pulmonary/Chest: Effort normal. No accessory muscle usage or stridor. No tachypnea. No respiratory distress. He has wheezes in the right middle field, the right lower field, the left middle field and the left lower field. He has rhonchi in the right middle field, the right lower field, the left middle field and the left lower field. He has rales in the left lower field. He exhibits no tenderness.  Abdominal: Soft. Bowel sounds are normal. He exhibits no distension and no mass. There is no tenderness. There is no rebound and no guarding.  Musculoskeletal: Normal range of motion. He exhibits no edema, tenderness or deformity.  Lymphadenopathy:    He has no cervical adenopathy.  Neurological: He is oriented to person, place, and time.  Skin: Skin is warm and dry. No rash noted. He is not diaphoretic. No erythema. No pallor.    Lab Results  Component Value Date   WBC 8.6 01/24/2016   HGB 16.6 01/24/2016   HCT 47.2 01/24/2016   PLT 263 07/26/2015    GLUCOSE 104 (H) 01/24/2016   CHOL 208 (H) 01/24/2016   TRIG 166 (H) 01/24/2016   HDL 33 (L) 01/24/2016   LDLCALC 142 (H) 01/24/2016   ALT 21 07/26/2015   AST 20 07/26/2015   NA 139 01/24/2016   K 4.6 01/24/2016   CL 106 01/24/2016   CREATININE 0.84 01/24/2016   BUN 23 01/24/2016   CO2 24 01/24/2016   HGBA1C 5.8 01/24/2016   MICROALBUR 0.7 07/26/2015      Assessment & Plan:   [facility] was seen today for copd.  Diagnoses and all orders for this visit:  Chronic obstructive pulmonary disease, unspecified COPD type ([place]) - I recommended that he start triple therapy (LABA/LAMA/ICA) and prn use of albuterol -     Fluticasone-Umeclidin-Vilant (TRELEGY ELLIPTA) 100-62.5-25 MCG/INH AEPB; Inhale 1 puff into the lungs daily.  Hyperlipidemia with target LDL less than 130- he has an elevated Framingham risk score so I have asked him to start a statin and aspirin for cardiovascular risk reduction. -     atorvastatin (LIPITOR) 20 MG tablet; Take 1 tablet (20  mg total) by mouth daily. -     aspirin EC 81 MG tablet; Take 1 tablet (81 mg total) by mouth daily.  Need for prophylactic vaccination against Streptococcus pneumoniae (pneumococcus) -     Pneumococcal polysaccharide vaccine 23-valent greater than or equal to 2yo subcutaneous/IM  Atherosclerosis of aorta (HCC)- this was seen on the CT scan from earlier this year, will start risk reduction therapy with a statin and aspirin   I have discontinued Mr. Geer Glucosamine-Chondroit-Vit C-Mn (GLUCOSAMINE 1500 COMPLEX PO), multivitamin, metaxalone, SPIRIVA HANDIHALER, QVAR, amoxicillin, predniSONE, and MULTI-VITAMINS. I am also having him start on Fluticasone-Umeclidin-Vilant, atorvastatin, and aspirin EC. Additionally, I am having him maintain his Fish Oil, tamsulosin, dutasteride, mupirocin ointment, albuterol, glucosamine-chondroitin, and clotrimazole-betamethasone.  Meds ordered this encounter  Medications  . glucosamine-chondroitin  500-400 MG tablet    Sig: Take 3 tablets by mouth 3 (three) times daily.  Marland Kitchen DISCONTD: Multiple Vitamin (MULTI-VITAMINS) TABS    Sig: Take by mouth.  . clotrimazole-betamethasone (LOTRISONE) cream  . Fluticasone-Umeclidin-Vilant (TRELEGY ELLIPTA) 100-62.5-25 MCG/INH AEPB    Sig: Inhale 1 puff into the lungs daily.    Dispense:  30 each    Refill:  11  . atorvastatin (LIPITOR) 20 MG tablet    Sig: Take 1 tablet (20 mg total) by mouth daily.    Dispense:  90 tablet    Refill:  3  . aspirin EC 81 MG tablet    Sig: Take 1 tablet (81 mg total) by mouth daily.    Dispense:  90 tablet    Refill:  3     Follow-up: Return in about 6 months (around 01/06/2017).  Scarlette Calico, MD

## 2016-07-09 DIAGNOSIS — I700 Atherosclerosis of aorta: Secondary | ICD-10-CM | POA: Insufficient documentation

## 2016-09-03 ENCOUNTER — Encounter: Payer: Self-pay | Admitting: Internal Medicine

## 2016-09-03 ENCOUNTER — Other Ambulatory Visit: Payer: Self-pay | Admitting: Internal Medicine

## 2016-09-03 ENCOUNTER — Other Ambulatory Visit: Payer: Self-pay | Admitting: Urgent Care

## 2016-09-03 DIAGNOSIS — J42 Unspecified chronic bronchitis: Secondary | ICD-10-CM

## 2016-09-03 MED ORDER — TIOTROPIUM BROMIDE MONOHYDRATE 18 MCG IN CAPS: 18.0000 ug | ORAL_CAPSULE | Freq: Every day | RESPIRATORY_TRACT | 3 refills | Status: DC

## 2016-09-03 MED ORDER — BECLOMETHASONE DIPROPIONATE 80 MCG/ACT IN AERS: 2.0000 | INHALATION_SPRAY | Freq: Two times a day (BID) | RESPIRATORY_TRACT | 3 refills | Status: DC

## 2016-10-09 ENCOUNTER — Ambulatory Visit: Payer: PRIVATE HEALTH INSURANCE | Admitting: Internal Medicine

## 2016-10-09 ENCOUNTER — Encounter: Payer: Self-pay | Admitting: Internal Medicine

## 2016-10-10 ENCOUNTER — Ambulatory Visit (INDEPENDENT_AMBULATORY_CARE_PROVIDER_SITE_OTHER): Payer: PRIVATE HEALTH INSURANCE | Admitting: Internal Medicine

## 2016-10-10 ENCOUNTER — Encounter: Payer: Self-pay | Admitting: Internal Medicine

## 2016-10-10 DIAGNOSIS — J441 Chronic obstructive pulmonary disease with (acute) exacerbation: Secondary | ICD-10-CM

## 2016-10-10 DIAGNOSIS — I10 Essential (primary) hypertension: Secondary | ICD-10-CM | POA: Diagnosis not present

## 2016-10-10 MED ORDER — PREDNISONE 10 MG PO TABS: ORAL_TABLET | ORAL | 0 refills | Status: DC

## 2016-10-10 MED ORDER — METHYLPREDNISOLONE ACETATE 80 MG/ML IJ SUSP
80.0000 mg | Freq: Once | INTRAMUSCULAR | Status: AC
  Administered 2016-10-10: 80 mg via INTRAMUSCULAR

## 2016-10-10 NOTE — Progress Notes (Signed)
Pre visit review using our clinic review tool, if applicable. No additional management support is needed unless otherwise documented below in the visit note. 

## 2016-10-10 NOTE — Progress Notes (Signed)
Subjective:    Patient ID: George Watson, male    DOB: [birth year], 71 y.o.   MRN: [id number]  HPI  Here with acute onset mild to mod 2-3 days general weakness and malaise, with scant prod cough and mild sob/doe/wheezing worse at night,  but Pt denies chest pain, orthopnea, PND, increased LE swelling, palpitations, dizziness or syncope.   Past Medical History:  Diagnosis Date  . BPH (benign prostatic hypertrophy)   . COPD (chronic obstructive pulmonary disease) ([place])   . DDD (degenerative disc disease), lumbosacral   . Elevated PSA   . Essential hypertension, benign    diet controlled  . Fracture of shaft of clavicle   . GERD (gastroesophageal reflux disease)   . H/O drug abuse    multisubstance  . Hepatitis C 10/1996  . HSV-2 (herpes simplex virus 2) infection   . Hx of biopsy 08/2004   liver  . Inguinal hernia    right  . Other and unspecified hyperlipidemia   . Prostate cancer ([place])   . Substance abuse   . Tuberculosis    Past Surgical History:  Procedure Laterality Date  . CERVICAL SPINE SURGERY    . fracture ribs     3  . HERNIA REPAIR    . INGUINAL HERNIA REPAIR  07/01/2012   Procedure: HERNIA REPAIR INGUINAL ADULT;  Surgeon: Madilyn Hook, DO;  Location: [facility];  Service: General;  Laterality: Right;  with Mesh  . left knee meniscus repair  1992  . right rotator cuff  1992  . right shoulder arthroscopy  2011    reports that he quit smoking about 14 years ago. His smoking use included Cigarettes. He quit after 43.00 years of use. He has never used smokeless tobacco. He reports that he does not drink alcohol or use drugs. family history includes Cancer in his father and sister; Leukemia in his mother. No Known Allergies Current Outpatient Prescriptions on File Prior to Visit  Medication Sig Dispense Refill  . albuterol (PROAIR HFA) 108 (90 Base) MCG/ACT inhaler Inhale 2 puffs into the lungs every 6 (six) hours as needed for wheezing or shortness of breath. 8.5 each  0  . aspirin EC 81 MG tablet Take 1 tablet (81 mg total) by mouth daily. 90 tablet 3  . atorvastatin (LIPITOR) 20 MG tablet Take 1 tablet (20 mg total) by mouth daily. 90 tablet 3  . beclomethasone (QVAR) 80 MCG/ACT inhaler Inhale 2 puffs into the lungs 2 (two) times daily. 3 Inhaler 3  . clotrimazole-betamethasone (LOTRISONE) cream     . dutasteride (AVODART) 0.5 MG capsule Take 0.5 mg by mouth daily.    Marland Kitchen glucosamine-chondroitin 500-400 MG tablet Take 3 tablets by mouth 3 (three) times daily.    . mupirocin ointment (BACTROBAN) 2 % Applied to the irritated area twice a day 22 g 3  . Omega-3 Fatty Acids (FISH OIL) 1000 MG CAPS Take 1 capsule by mouth 3 (three) times daily.     Marland Kitchen QVAR 80 MCG/ACT inhaler INHALE 2 PUFFS TWICE DAILY AS DIRECTED 8.7 g 11  . Tamsulosin HCl (FLOMAX) 0.4 MG CAPS Take 0.4 mg by mouth daily after breakfast.    . tiotropium (SPIRIVA) 18 MCG inhalation capsule Place 1 capsule (18 mcg total) into inhaler and inhale daily. 90 capsule 3   No current facility-administered medications on file prior to visit.    Review of Systems All otherwise neg per pt     Objective:   Physical Exam BP Marland Kitchen)  142/88   Pulse 65   Temp 98.4 F (36.9 C) (Oral)   Wt 173 lb (78.5 kg)   SpO2 96%   BMI 24.13 kg/m  VS noted,  Constitutional: Pt appears in no apparent distress HENT: Head: NCAT.  Right Ear: External ear normal.  Left Ear: External ear normal.  Eyes: . Pupils are equal, round, and reactive to light. Conjunctivae and EOM are normal Bilat tm's with mild erythema.  Max sinus areas non tender.  Pharynx with mild erythema, no exudate Neck: Normal range of motion. Neck supple.  Cardiovascular: Normal rate and regular rhythm.   Pulmonary/Chest: Effort normal and breath sounds decreased without rales but with few bilat wheezing.  Neurological: Pt is alert. Not confused , motor grossly intact Skin: Skin is warm. No rash, no LE edema Psychiatric: Pt behavior is normal. No  agitation.  No other exam findings    Assessment & Plan:

## 2016-10-10 NOTE — Patient Instructions (Signed)
You had the steroid shot today  Please take all new medication as prescribed  - the prednisone   Please continue all other medications as before,   Please have the pharmacy call with any other refills you may need.  Please keep your appointments with your specialists as you may have planned

## 2016-10-11 NOTE — Assessment & Plan Note (Signed)
afeb but with s/s mild to mod, for depomedrol IM 80, and predpac asd, cont inhalers, declines cxr, doubt antibx are indicated,  to f/u any worsening symptoms or concerns

## 2016-10-11 NOTE — Assessment & Plan Note (Signed)
Mild elev likely situational, ok to follow BP at home and next visit

## 2016-10-13 ENCOUNTER — Ambulatory Visit: Payer: PRIVATE HEALTH INSURANCE | Admitting: Family

## 2016-10-28 ENCOUNTER — Encounter (HOSPITAL_COMMUNITY): Payer: Self-pay | Admitting: *Deleted

## 2016-10-28 ENCOUNTER — Ambulatory Visit (HOSPITAL_COMMUNITY)
Admission: EM | Admit: 2016-10-28 | Discharge: 2016-10-28 | Disposition: A | Discharge status: Home / Self Care | Payer: PRIVATE HEALTH INSURANCE | Attending: Family Medicine | Admitting: Family Medicine

## 2016-10-28 DIAGNOSIS — T63441A Toxic effect of venom of bees, accidental (unintentional), initial encounter: Secondary | ICD-10-CM | POA: Diagnosis not present

## 2016-10-28 DIAGNOSIS — M79602 Pain in left arm: Secondary | ICD-10-CM | POA: Diagnosis not present

## 2016-10-28 MED ORDER — LIDOCAINE HCL 2 % IJ SOLN
INTRAMUSCULAR | Status: AC
  Filled 2016-10-28: qty 20

## 2016-10-28 MED ORDER — DOXYCYCLINE HYCLATE 100 MG PO CAPS: 100.0000 mg | ORAL_CAPSULE | Freq: Two times a day (BID) | ORAL | 0 refills | Status: DC

## 2016-10-28 MED ORDER — METHYLPREDNISOLONE 4 MG PO TBPK: ORAL_TABLET | ORAL | 0 refills | Status: DC

## 2016-10-28 NOTE — ED Triage Notes (Signed)
Stung  Be    A     Bee        2  Days  Ago  The  Arm is  Red       And   Swelling            Itches

## 2016-10-28 NOTE — ED Provider Notes (Signed)
CSN: [id number]     Arrival date & time 10/28/16  1158 History   None    Chief Complaint  Patient presents with  . Insect Bite   (Consider location/radiation/quality/duration/timing/severity/associated sxs/prior Treatment) Patient c/o bee sting left forearm yesterday and now it is swollen and painful.   The history is provided by the patient.  Wound Check  This is a new problem. The current episode started yesterday. The problem occurs constantly. The problem has been gradually worsening. Nothing aggravates the symptoms. Nothing relieves the symptoms. He has tried nothing for the symptoms.    Past Medical History:  Diagnosis Date  . BPH (benign prostatic hypertrophy)   . COPD (chronic obstructive pulmonary disease) ([place])   . DDD (degenerative disc disease), lumbosacral   . Elevated PSA   . Essential hypertension, benign    diet controlled  . Fracture of shaft of clavicle   . GERD (gastroesophageal reflux disease)   . H/O drug abuse    multisubstance  . Hepatitis C 10/1996  . HSV-2 (herpes simplex virus 2) infection   . Hx of biopsy 08/2004   liver  . Inguinal hernia    right  . Other and unspecified hyperlipidemia   . Prostate cancer ([place])   . Substance abuse   . Tuberculosis    Past Surgical History:  Procedure Laterality Date  . CERVICAL SPINE SURGERY    . fracture ribs     3  . HERNIA REPAIR    . INGUINAL HERNIA REPAIR  07/01/2012   Procedure: HERNIA REPAIR INGUINAL ADULT;  Surgeon: Madilyn Hook, DO;  Location: [facility];  Service: General;  Laterality: Right;  with Mesh  . left knee meniscus repair  1992  . right rotator cuff  1992  . right shoulder arthroscopy  2011   Family History  Problem Relation Age of Onset  . Cancer Sister     breast  . Leukemia Mother   . Cancer Father    Social History  Substance Use Topics  . Smoking status: Former Smoker    Years: 43.00    Types: Cigarettes    Quit date: 07/21/2002  . Smokeless tobacco: Never Used  . Alcohol  use No    Review of Systems  Constitutional: Negative.   HENT: Negative.   Eyes: Negative.   Respiratory: Negative.   Cardiovascular: Negative.   Gastrointestinal: Negative.   Endocrine: Negative.   Genitourinary: Negative.   Musculoskeletal: Negative.   Allergic/Immunologic: Negative.   Neurological: Negative.   Hematological: Negative.   Psychiatric/Behavioral: Negative.     Allergies  Patient has no known allergies.  Home Medications   Prior to Admission medications   Medication Sig Start Date End Date Taking? Authorizing Provider  albuterol (PROAIR HFA) 108 (90 Base) MCG/ACT inhaler Inhale 2 puffs into the lungs every 6 (six) hours as needed for wheezing or shortness of breath. 04/30/16   Darlyne Russian, MD  aspirin EC 81 MG tablet Take 1 tablet (81 mg total) by mouth daily. 07/08/16   Janith Lima, MD  atorvastatin (LIPITOR) 20 MG tablet Take 1 tablet (20 mg total) by mouth daily. 07/08/16   Janith Lima, MD  beclomethasone (QVAR) 80 MCG/ACT inhaler Inhale 2 puffs into the lungs 2 (two) times daily. 09/03/16   Janith Lima, MD  clotrimazole-betamethasone (LOTRISONE) cream  05/23/16   Historical Provider, MD  doxycycline (VIBRAMYCIN) 100 MG capsule Take 1 capsule (100 mg total) by mouth 2 (two) times daily. 10/28/16  Lysbeth Penner, FNP  dutasteride (AVODART) 0.5 MG capsule Take 0.5 mg by mouth daily.    Historical Provider, MD  glucosamine-chondroitin 500-400 MG tablet Take 3 tablets by mouth 3 (three) times daily.    Historical Provider, MD  methylPREDNISolone (MEDROL DOSEPAK) 4 MG TBPK tablet Take 6-5-4-3-2-1 po qd 10/28/16   Lysbeth Penner, FNP  mupirocin ointment (BACTROBAN) 2 % Applied to the irritated area twice a day 01/25/16   Darlyne Russian, MD  Omega-3 Fatty Acids (FISH OIL) 1000 MG CAPS Take 1 capsule by mouth 3 (three) times daily.     Historical Provider, MD  predniSONE (DELTASONE) 10 MG tablet 4 tab by mouth x 3day,3 tabs x 3day,2tab x 3 day, 1 tab x 3day  10/10/16   Biagio Borg, MD  QVAR 80 MCG/ACT inhaler INHALE 2 PUFFS TWICE DAILY AS DIRECTED 09/04/16   Janith Lima, MD  Tamsulosin HCl (FLOMAX) 0.4 MG CAPS Take 0.4 mg by mouth daily after breakfast.    Historical Provider, MD  tiotropium (SPIRIVA) 18 MCG inhalation capsule Place 1 capsule (18 mcg total) into inhaler and inhale daily. 09/03/16   Janith Lima, MD   Meds Ordered and Administered this Visit  Medications - No data to display  BP 138/70 (BP Location: Right Arm)   Pulse 82   Temp 98.6 F (37 C) (Oral)   Resp 18   SpO2 100%  No data found.   Physical Exam  Constitutional: He appears well-developed and well-nourished.  HENT:  Head: Normocephalic.  Eyes: Conjunctivae and EOM are normal. Pupils are equal, round, and reactive to light.  Neck: Normal range of motion. Neck supple.  Cardiovascular: Normal rate, regular rhythm and normal heart sounds.   Pulmonary/Chest: Effort normal and breath sounds normal.  Skin: There is erythema.  Left forearm with erythema approx 10 cm x 6 cm and tenderness with central incision area.  Nursing note and vitals reviewed.   Urgent Care Course     Procedures (including critical care time)  Labs Review Labs Reviewed - No data to display  Imaging Review No results found.   Visual Acuity Review  Right Eye Distance:   Left Eye Distance:   Bilateral Distance:    Right Eye Near:   Left Eye Near:    Bilateral Near:         MDM   1. Bee sting, accidental or unintentional, initial encounter    Medrol dose pack as directed 4mg  6-5-4-3-2-1 po qd #21 Doxycycline 100mg  one po bid x 10 days  Take benadryl otc      Lysbeth Penner, FNP 10/28/16 1401

## 2016-12-03 ENCOUNTER — Telehealth: Payer: Self-pay

## 2016-12-03 MED ORDER — BECLOMETHASONE DIPROP HFA 80 MCG/ACT IN AERB: 2.0000 | INHALATION_SPRAY | Freq: Two times a day (BID) | RESPIRATORY_TRACT | 1 refills | Status: AC

## 2016-12-03 NOTE — Telephone Encounter (Signed)
Pharmacy called and stated that the qvar is no longer available. qvar redihaler has been sent in.

## 2016-12-04 ENCOUNTER — Encounter: Payer: Self-pay | Admitting: Internal Medicine

## 2016-12-08 MED ORDER — BECLOMETHASONE DIPROPIONATE 80 MCG/ACT IN AERS: 2.0000 | INHALATION_SPRAY | Freq: Two times a day (BID) | RESPIRATORY_TRACT | 5 refills | Status: DC

## 2016-12-22 ENCOUNTER — Other Ambulatory Visit: Payer: Self-pay | Admitting: Gastroenterology

## 2016-12-22 DIAGNOSIS — B182 Chronic viral hepatitis C: Secondary | ICD-10-CM

## 2016-12-29 ENCOUNTER — Ambulatory Visit
Admission: RE | Admit: 2016-12-29 | Discharge: 2016-12-29 | Disposition: A | Discharge status: Home / Self Care | Payer: PRIVATE HEALTH INSURANCE | Source: Ambulatory Visit | Attending: Gastroenterology | Admitting: Gastroenterology

## 2016-12-29 DIAGNOSIS — B182 Chronic viral hepatitis C: Secondary | ICD-10-CM

## 2016-12-31 ENCOUNTER — Encounter: Payer: Self-pay | Admitting: Internal Medicine

## 2016-12-31 ENCOUNTER — Other Ambulatory Visit: Payer: Self-pay | Admitting: Internal Medicine

## 2016-12-31 DIAGNOSIS — I700 Atherosclerosis of aorta: Secondary | ICD-10-CM

## 2016-12-31 DIAGNOSIS — I10 Essential (primary) hypertension: Secondary | ICD-10-CM

## 2016-12-31 DIAGNOSIS — N400 Enlarged prostate without lower urinary tract symptoms: Secondary | ICD-10-CM

## 2016-12-31 DIAGNOSIS — E785 Hyperlipidemia, unspecified: Secondary | ICD-10-CM

## 2016-12-31 DIAGNOSIS — C61 Malignant neoplasm of prostate: Secondary | ICD-10-CM

## 2017-01-06 ENCOUNTER — Other Ambulatory Visit (INDEPENDENT_AMBULATORY_CARE_PROVIDER_SITE_OTHER): Payer: PRIVATE HEALTH INSURANCE

## 2017-01-06 ENCOUNTER — Encounter: Payer: Self-pay | Admitting: Internal Medicine

## 2017-01-06 ENCOUNTER — Telehealth: Payer: Self-pay

## 2017-01-06 ENCOUNTER — Ambulatory Visit (INDEPENDENT_AMBULATORY_CARE_PROVIDER_SITE_OTHER): Payer: PRIVATE HEALTH INSURANCE | Admitting: Internal Medicine

## 2017-01-06 VITALS — BP 138/86 | HR 71 | Temp 98.4°F | Resp 16 | Ht 71.0 in | Wt 171.0 lb

## 2017-01-06 DIAGNOSIS — N400 Enlarged prostate without lower urinary tract symptoms: Secondary | ICD-10-CM

## 2017-01-06 DIAGNOSIS — I10 Essential (primary) hypertension: Secondary | ICD-10-CM

## 2017-01-06 DIAGNOSIS — D72829 Elevated white blood cell count, unspecified: Secondary | ICD-10-CM | POA: Insufficient documentation

## 2017-01-06 DIAGNOSIS — R7303 Prediabetes: Secondary | ICD-10-CM | POA: Insufficient documentation

## 2017-01-06 DIAGNOSIS — R739 Hyperglycemia, unspecified: Secondary | ICD-10-CM

## 2017-01-06 DIAGNOSIS — E785 Hyperlipidemia, unspecified: Secondary | ICD-10-CM

## 2017-01-06 DIAGNOSIS — I700 Atherosclerosis of aorta: Secondary | ICD-10-CM

## 2017-01-06 DIAGNOSIS — J411 Mucopurulent chronic bronchitis: Secondary | ICD-10-CM

## 2017-01-06 DIAGNOSIS — Z0000 Encounter for general adult medical examination without abnormal findings: Secondary | ICD-10-CM | POA: Diagnosis not present

## 2017-01-06 DIAGNOSIS — C61 Malignant neoplasm of prostate: Secondary | ICD-10-CM | POA: Diagnosis not present

## 2017-01-06 DIAGNOSIS — E119 Type 2 diabetes mellitus without complications: Secondary | ICD-10-CM | POA: Insufficient documentation

## 2017-01-06 LAB — URINALYSIS, ROUTINE W REFLEX MICROSCOPIC
BILIRUBIN URINE: NEGATIVE
KETONES UR: NEGATIVE
Leukocytes, UA: NEGATIVE
NITRITE: NEGATIVE
PH: 6 (ref 5.0–8.0)
RBC / HPF: NONE SEEN (ref 0–?)
Specific Gravity, Urine: 1.025 (ref 1.000–1.030)
TOTAL PROTEIN, URINE-UPE24: NEGATIVE
Urine Glucose: NEGATIVE
Urobilinogen, UA: 0.2 (ref 0.0–1.0)

## 2017-01-06 LAB — COMPREHENSIVE METABOLIC PANEL
ALT: 22 U/L (ref 0–53)
AST: 19 U/L (ref 0–37)
Albumin: 4.8 g/dL (ref 3.5–5.2)
Alkaline Phosphatase: 57 U/L (ref 39–117)
BUN: 22 mg/dL (ref 6–23)
CALCIUM: 10 mg/dL (ref 8.4–10.5)
CHLORIDE: 107 meq/L (ref 96–112)
CO2: 26 meq/L (ref 19–32)
Creatinine, Ser: 0.85 mg/dL (ref 0.40–1.50)
GFR: 94.79 mL/min (ref >60.00)
Glucose, Bld: 133 mg/dL — ABNORMAL HIGH (ref 70–99)
POTASSIUM: 4.6 meq/L (ref 3.5–5.1)
Sodium: 139 mEq/L (ref 135–145)
Total Bilirubin: 0.5 mg/dL (ref 0.2–1.2)
Total Protein: 7.2 g/dL (ref 6.0–8.3)

## 2017-01-06 LAB — CBC WITH DIFFERENTIAL/PLATELET
BASOS PCT: 0.8 % (ref 0.0–3.0)
Basophils Absolute: 0.1 10*3/uL (ref 0.0–0.1)
EOS PCT: 6.5 % — AB (ref 0.0–5.0)
Eosinophils Absolute: 0.7 10*3/uL (ref 0.0–0.7)
HEMATOCRIT: 48.6 % (ref 39.0–52.0)
Hemoglobin: 16.7 g/dL (ref 13.0–17.0)
LYMPHS PCT: 36.4 % (ref 12.0–46.0)
Lymphs Abs: 3.9 10*3/uL (ref 0.7–4.0)
MCHC: 34.4 g/dL (ref 30.0–36.0)
MCV: 91.6 fl (ref 78.0–100.0)
MONOS PCT: 13.3 % — AB (ref 3.0–12.0)
Monocytes Absolute: 1.4 10*3/uL — ABNORMAL HIGH (ref 0.1–1.0)
Neutro Abs: 4.6 10*3/uL (ref 1.4–7.7)
Neutrophils Relative %: 43 % (ref 43.0–77.0)
Platelets: 246 10*3/uL (ref 150.0–400.0)
RBC: 5.31 Mil/uL (ref 4.22–5.81)
RDW: 13.2 % (ref 11.5–15.5)
WBC: 10.6 10*3/uL — ABNORMAL HIGH (ref 4.0–10.5)

## 2017-01-06 LAB — PSA: PSA: 1.68 ng/mL (ref 0.10–4.00)

## 2017-01-06 LAB — LIPID PANEL
CHOL/HDL RATIO: 4
Cholesterol: 150 mg/dL (ref 0–200)
HDL: 34.5 mg/dL — AB (ref >39.00)
LDL CALC: 91 mg/dL (ref 0–99)
NONHDL: 115.45
Triglycerides: 122 mg/dL (ref 0.0–149.0)
VLDL: 24.4 mg/dL (ref 0.0–40.0)

## 2017-01-06 LAB — HEMOGLOBIN A1C: HEMOGLOBIN A1C: 6.2 % (ref 4.6–6.5)

## 2017-01-06 LAB — TSH: TSH: 2.24 u[IU]/mL (ref 0.35–4.50)

## 2017-01-06 MED ORDER — FLUTICASONE-UMECLIDIN-VILANT 100-62.5-25 MCG/INH IN AEPB: 1.0000 | INHALATION_SPRAY | Freq: Every day | RESPIRATORY_TRACT | 3 refills | Status: DC

## 2017-01-06 NOTE — Telephone Encounter (Signed)
Pt was told today that George Watson wants him to come back to the lab in 2 months to check his WBC, can orders be put in for this, but pt stated no appt is necessary

## 2017-01-06 NOTE — Patient Instructions (Signed)

## 2017-01-06 NOTE — Telephone Encounter (Signed)
PCP requested for an add on A1c.  Called lab and was told they could use the lavendar tube that was drawn for the cbc.   Add on Lab sheet sheet to fax number on page.

## 2017-01-06 NOTE — Telephone Encounter (Signed)
Did you want pt to come back in 2 months?

## 2017-01-06 NOTE — Progress Notes (Signed)
Subjective:  Patient ID: George Watson, male    DOB: 1946/02/02  Age: 71 y.o. MRN: [id number]  CC: COPD   HPI [name] presents for a CPX.  He wants to consolidate his inhalers into 1. The current regimen using 3 different inhalers is expensive and cumbersome. He's had a few exacerbations of COPD earlier this year but for the last few weeks has felt well with no episodes of chest pain, hemoptysis, fever, chills, shortness of breath, or chest pain. He sees his urologist one day after this visit.  Outpatient Medications Prior to Visit  Medication Sig Dispense Refill  . albuterol (PROAIR HFA) 108 (90 Base) MCG/ACT inhaler Inhale 2 puffs into the lungs every 6 (six) hours as needed for wheezing or shortness of breath. 8.5 each 0  . aspirin EC 81 MG tablet Take 1 tablet (81 mg total) by mouth daily. 90 tablet 3  . atorvastatin (LIPITOR) 20 MG tablet Take 1 tablet (20 mg total) by mouth daily. 90 tablet 3  . clotrimazole-betamethasone (LOTRISONE) cream     . dutasteride (AVODART) 0.5 MG capsule Take 0.5 mg by mouth daily.    Marland Kitchen glucosamine-chondroitin 500-400 MG tablet Take 3 tablets by mouth 3 (three) times daily.    . mupirocin ointment (BACTROBAN) 2 % Applied to the irritated area twice a day 22 g 3  . Omega-3 Fatty Acids (FISH OIL) 1000 MG CAPS Take 1 capsule by mouth 3 (three) times daily.     . Tamsulosin HCl (FLOMAX) 0.4 MG CAPS Take 0.4 mg by mouth daily after breakfast.    . beclomethasone (QVAR) 80 MCG/ACT inhaler Inhale 2 puffs into the lungs 2 (two) times daily. 8.7 g 5  . tiotropium (SPIRIVA) 18 MCG inhalation capsule Place 1 capsule (18 mcg total) into inhaler and inhale daily. 90 capsule 3  . beclomethasone (QVAR) 80 MCG/ACT inhaler Inhale 2 puffs into the lungs 2 (two) times daily. 3 Inhaler 3  . doxycycline (VIBRAMYCIN) 100 MG capsule Take 1 capsule (100 mg total) by mouth 2 (two) times daily. 20 capsule 0  . methylPREDNISolone (MEDROL DOSEPAK) 4 MG TBPK tablet Take  6-5-4-3-2-1 po qd 21 tablet 0  . predniSONE (DELTASONE) 10 MG tablet 4 tab by mouth x 3day,3 tabs x 3day,2tab x 3 day, 1 tab x 3day 30 tablet 0   No facility-administered medications prior to visit.     ROS Review of Systems  Constitutional: Negative.  Negative for appetite change, chills, diaphoresis, fatigue and unexpected weight change.  HENT: Negative.   Eyes: Negative for visual disturbance.  Respiratory: Negative for cough, chest tightness, shortness of breath, wheezing and stridor.   Cardiovascular: Negative for chest pain, palpitations and leg swelling.  Gastrointestinal: Negative.  Negative for abdominal pain, constipation, diarrhea and vomiting.  Endocrine: Negative.  Negative for cold intolerance, heat intolerance, polydipsia, polyphagia and polyuria.  Genitourinary: Negative.  Negative for difficulty urinating, dysuria, frequency, hematuria, scrotal swelling, testicular pain and urgency.  Musculoskeletal: Negative.  Negative for back pain and myalgias.  Skin: Negative.  Negative for rash.  Neurological: Negative.  Negative for dizziness, weakness and light-headedness.  Hematological: Negative.  Negative for adenopathy. Does not bruise/bleed easily.  Psychiatric/Behavioral: Negative.     Objective:  BP 138/86 (BP Location: Left Arm, Patient Position: Sitting, Cuff Size: Normal)   Pulse 71   Temp 98.4 F (36.9 C) (Oral)   Resp 16   Ht 5\' 11"  (1.803 m)   Wt 171 lb (77.6 kg)  SpO2 98%   BMI 23.85 kg/m   BP Readings from Last 3 Encounters:  01/06/17 138/86  10/28/16 138/70  10/10/16 (!) 142/88    Wt Readings from Last 3 Encounters:  01/06/17 171 lb (77.6 kg)  10/10/16 173 lb (78.5 kg)  07/08/16 171 lb 2 oz (77.6 kg)    Physical Exam  Constitutional: He is oriented to person, place, and time. No distress.  HENT:  Mouth/Throat: Oropharynx is clear and moist. No oropharyngeal exudate.  Eyes: Conjunctivae are normal. Right eye exhibits no discharge. Left eye  exhibits no discharge. No scleral icterus.  Neck: Normal range of motion. Neck supple. No JVD present. No thyromegaly present.  Cardiovascular: Regular rhythm.  Exam reveals no gallop.   No murmur heard. Pulmonary/Chest: Effort normal. No accessory muscle usage. No tachypnea. No respiratory distress. He has no decreased breath sounds. He has no wheezes. He has rhonchi in the right lower field and the left lower field. He has no rales.  He has mild, diffuse, lower lobe inspiratory rhonchi but good air movement  Abdominal: Soft. Bowel sounds are normal. He exhibits no distension and no mass. There is no rebound and no guarding.  Genitourinary:  Genitourinary Comments: GU and rectal exams were deferred at his request since he tells me that he seeing his urologist one day after this visit.  Musculoskeletal: Normal range of motion. He exhibits no edema or tenderness.  Lymphadenopathy:    He has no cervical adenopathy.  Neurological: He is alert and oriented to person, place, and time.  Skin: Skin is warm and dry. No rash noted. He is not diaphoretic. No erythema. No pallor.  Psychiatric: He has a normal mood and affect. His behavior is normal. Judgment and thought content normal.  Vitals reviewed.   Lab Results  Component Value Date   WBC 10.6 (H) 01/06/2017   HGB 16.7 01/06/2017   HCT 48.6 01/06/2017   PLT 246.0 01/06/2017   GLUCOSE 133 (H) 01/06/2017   CHOL 150 01/06/2017   TRIG 122.0 01/06/2017   HDL 34.50 (L) 01/06/2017   LDLCALC 91 01/06/2017   ALT 22 01/06/2017   AST 19 01/06/2017   NA 139 01/06/2017   K 4.6 01/06/2017   CL 107 01/06/2017   CREATININE 0.85 01/06/2017   BUN 22 01/06/2017   CO2 26 01/06/2017   TSH 2.24 01/06/2017   PSA 1.68 01/06/2017   HGBA1C 6.2 01/06/2017   MICROALBUR 0.7 07/26/2015    US Abdomen Complete W/elastography  Result Date: 12/29/2016 CLINICAL DATA:  Patient with history of cirrhosis. EXAM: ULTRASOUND ABDOMEN ULTRASOUND HEPATIC ELASTOGRAPHY  TECHNIQUE: Sonography of the upper abdomen was performed. In addition, ultrasound elastography evaluation of the liver was performed. A region of interest was placed within the right lobe of the liver. Following application of a compressive sonographic pulse, shear waves were detected in the adjacent hepatic tissue and the shear wave velocity was calculated. Multiple assessments were performed at the selected site. Median shear wave velocity is correlated to a Metavir fibrosis score. COMPARISON:  Abdominal ultrasound 02/05/2016. FINDINGS: ULTRASOUND ABDOMEN Gallbladder: No gallstones or wall thickening visualized. No sonographic Murphy sign noted by sonographer. Common bile duct: Diameter: 3 mm Liver: Increased in echogenicity. No focal lesion identified. Hepatopetal portal venous flow. IVC: No abnormality visualized. Pancreas: Visualized portion unremarkable. Spleen: Size and appearance within normal limits. Right Kidney: Length: 11.5 cm. Echogenicity within normal limits. No mass or hydronephrosis visualized. Left Kidney: Length: 12.5 cm. Echogenicity within normal limits. No mass or  hydronephrosis visualized. Abdominal aorta: Measures 3 cm. Other findings: None. ULTRASOUND HEPATIC ELASTOGRAPHY Device: Siemens Helix VTQ Patient position: Oblique Transducer 6C1 Number of measurements: 10 Hepatic segment:  8 Median velocity:   1.12  m/sec IQR: 0.21 IQR/Median velocity ratio: 0.19 Corresponding Metavir fibrosis score:  F0/F1 Risk of fibrosis: Minimal Limitations of exam: None Pertinent findings noted on other imaging exams:  Hepatic steatosis. Please note that abnormal shear wave velocities may also be identified in clinical settings other than with hepatic fibrosis, such as: acute hepatitis, elevated right heart and central venous pressures including use of beta blockers, veno-occlusive disease (Budd-Chiari), infiltrative processes such as mastocytosis/amyloidosis/infiltrative tumor, extrahepatic cholestasis, in the  post-prandial state, and liver transplantation. Correlation with patient history, laboratory data, and clinical condition recommended. IMPRESSION: ULTRASOUND ABDOMEN: Hepatic steatosis. Abdominal aorta measures 3 cm. Recommend followup by ultrasound in 3 years. This recommendation follows ACR consensus guidelines: White Paper of the ACR Incidental Findings Committee II on Vascular Findings. J Am Coll Radiol 2013; 12:458-099 ULTRASOUND HEPATIC ELASTOGRAPHY: Median hepatic shear wave velocity is calculated at 1.12 m/sec. Corresponding Metavir fibrosis score is  F0/F1. Risk of fibrosis is Minimal. Follow-up: None required Electronically Signed   By: Lovey Newcomer M.D.   On: 12/29/2016 11:09    Assessment & Plan:   Hasaan was seen today for copd.  Diagnoses and all orders for this visit:  Hyperglycemia- His A1c is up to 6.2%, he is prediabetic, no medications are needed to treat this, he will continue to monitor for signs and symptoms of hyperglycemia and I'll follow his A1c in the future. -     Hemoglobin A1c; Future  Mucopurulent chronic bronchitis ([place])- will consolidate his triple therapy into 1 inhaler. -     Fluticasone-Umeclidin-Vilant (TRELEGY ELLIPTA) 100-62.5-25 MCG/INH AEPB; Inhale 1 puff into the lungs daily.  Leukocytosis, unspecified type- his white blood cell count is mildly elevated 10.6 and he has a slight increase in the monocytes and eosinophils. He is not anemic and his other cell lines are normal. At this time this appears to be a benign leukocytosis but I have asked him to return in about 2 months me to recheck his CBC and to monitor him for development of lymphoproliferative disease.  Routine general medical examination at a health care facility- exam completed, labs ordered and reviewed, screening for colon cancer is up-to-date, vaccines reviewed, medication education material was given.  Essential hypertension, benign   I have discontinued Mr. Menz's beclomethasone,  tiotropium, predniSONE, methylPREDNISolone, doxycycline, and beclomethasone. I am also having him start on Fluticasone-Umeclidin-Vilant. Additionally, I am having him maintain his Fish Oil, tamsulosin, dutasteride, mupirocin ointment, albuterol, glucosamine-chondroitin, clotrimazole-betamethasone, atorvastatin, and aspirin EC.  Meds ordered this encounter  Medications  . Fluticasone-Umeclidin-Vilant (TRELEGY ELLIPTA) 100-62.5-25 MCG/INH AEPB    Sig: Inhale 1 puff into the lungs daily.    Dispense:  90 each    Refill:  3     Follow-up: Return if symptoms worsen or fail to improve.  Scarlette Calico, MD

## 2017-01-07 DIAGNOSIS — Z0000 Encounter for general adult medical examination without abnormal findings: Secondary | ICD-10-CM | POA: Insufficient documentation

## 2017-01-08 ENCOUNTER — Other Ambulatory Visit: Payer: Self-pay | Admitting: Internal Medicine

## 2017-01-08 DIAGNOSIS — D72829 Elevated white blood cell count, unspecified: Secondary | ICD-10-CM

## 2017-01-08 NOTE — Telephone Encounter (Signed)
I have ordered a CBC  He can just go straight to the lab in 2 months

## 2017-01-20 ENCOUNTER — Encounter: Payer: Self-pay | Admitting: Internal Medicine

## 2017-01-22 ENCOUNTER — Other Ambulatory Visit: Payer: Self-pay | Admitting: Internal Medicine

## 2017-01-22 DIAGNOSIS — M5137 Other intervertebral disc degeneration, lumbosacral region: Secondary | ICD-10-CM

## 2017-01-22 MED ORDER — METAXALONE 800 MG PO TABS: 800.0000 mg | ORAL_TABLET | Freq: Three times a day (TID) | ORAL | 2 refills | Status: DC

## 2017-02-23 ENCOUNTER — Encounter: Payer: Self-pay | Admitting: Internal Medicine

## 2017-02-23 ENCOUNTER — Other Ambulatory Visit: Payer: Self-pay | Admitting: Internal Medicine

## 2017-02-23 DIAGNOSIS — D72829 Elevated white blood cell count, unspecified: Secondary | ICD-10-CM

## 2017-02-26 ENCOUNTER — Encounter: Payer: Self-pay | Admitting: Internal Medicine

## 2017-02-26 ENCOUNTER — Other Ambulatory Visit (INDEPENDENT_AMBULATORY_CARE_PROVIDER_SITE_OTHER): Payer: PRIVATE HEALTH INSURANCE

## 2017-02-26 DIAGNOSIS — D72829 Elevated white blood cell count, unspecified: Secondary | ICD-10-CM | POA: Diagnosis not present

## 2017-02-26 LAB — CBC WITH DIFFERENTIAL/PLATELET
BASOS PCT: 0.9 % (ref 0.0–3.0)
Basophils Absolute: 0.1 10*3/uL (ref 0.0–0.1)
EOS ABS: 0.5 10*3/uL (ref 0.0–0.7)
EOS PCT: 5.1 % — AB (ref 0.0–5.0)
HEMATOCRIT: 47.3 % (ref 39.0–52.0)
HEMOGLOBIN: 15.9 g/dL (ref 13.0–17.0)
LYMPHS PCT: 29.6 % (ref 12.0–46.0)
Lymphs Abs: 2.8 10*3/uL (ref 0.7–4.0)
MCHC: 33.6 g/dL (ref 30.0–36.0)
MCV: 92.3 fl (ref 78.0–100.0)
MONO ABS: 1.4 10*3/uL — AB (ref 0.1–1.0)
Monocytes Relative: 14.4 % — ABNORMAL HIGH (ref 3.0–12.0)
Neutro Abs: 4.7 10*3/uL (ref 1.4–7.7)
Neutrophils Relative %: 50 % (ref 43.0–77.0)
Platelets: 239 10*3/uL (ref 150.0–400.0)
RBC: 5.13 Mil/uL (ref 4.22–5.81)
RDW: 13 % (ref 11.5–15.5)
WBC: 9.4 10*3/uL (ref 4.0–10.5)

## 2017-03-24 ENCOUNTER — Encounter: Payer: Self-pay | Admitting: Internal Medicine

## 2017-03-30 ENCOUNTER — Encounter: Payer: Self-pay | Admitting: Internal Medicine

## 2017-04-02 ENCOUNTER — Ambulatory Visit (HOSPITAL_COMMUNITY)
Admission: EM | Admit: 2017-04-02 | Discharge: 2017-04-02 | Disposition: A | Discharge status: Home / Self Care | Payer: PRIVATE HEALTH INSURANCE | Attending: Family Medicine | Admitting: Family Medicine

## 2017-04-02 ENCOUNTER — Encounter (HOSPITAL_COMMUNITY): Payer: Self-pay | Admitting: Emergency Medicine

## 2017-04-02 DIAGNOSIS — J209 Acute bronchitis, unspecified: Secondary | ICD-10-CM

## 2017-04-02 MED ORDER — ALBUTEROL SULFATE HFA 108 (90 BASE) MCG/ACT IN AERS: 1.0000 | INHALATION_SPRAY | Freq: Four times a day (QID) | RESPIRATORY_TRACT | 0 refills | Status: DC | PRN

## 2017-04-02 MED ORDER — AZITHROMYCIN 250 MG PO TABS: 250.0000 mg | ORAL_TABLET | Freq: Every day | ORAL | 0 refills | Status: DC

## 2017-04-02 MED ORDER — PREDNISONE 50 MG PO TABS: ORAL_TABLET | ORAL | 0 refills | Status: DC

## 2017-04-02 NOTE — ED Triage Notes (Signed)
Pt complains of sinus pressure and pain, nasal congestion, cough, and a mild sore throat and bilateral ear pain.  Pt states he has COPD and his sinus issues go straight to his chest.

## 2017-04-02 NOTE — ED Provider Notes (Signed)
[facility]   [id number] 04/02/17 Arrival Time: 2979   SUBJECTIVE:  George Watson is a 71 y.o. male who presents to the urgent care with complaint of cough, congestion, sinus pain and pressure, and shortness of breath for 2 days. Has past history of COPD. Denies any fever, chills, loss of appetite, nausea, vomiting, diarrhea, or other symptoms.     Past Medical History:  Diagnosis Date  . BPH (benign prostatic hypertrophy)   . COPD (chronic obstructive pulmonary disease) ([place])   . DDD (degenerative disc disease), lumbosacral   . Elevated PSA   . Essential hypertension, benign    diet controlled  . Fracture of shaft of clavicle   . GERD (gastroesophageal reflux disease)   . H/O drug abuse    multisubstance  . Hepatitis C 10/1996  . HSV-2 (herpes simplex virus 2) infection   . Hx of biopsy 08/2004   liver  . Inguinal hernia    right  . Other and unspecified hyperlipidemia   . Prostate cancer ([place])   . Substance abuse   . Tuberculosis    Family History  Problem Relation Age of Onset  . Cancer Sister        breast  . Leukemia Mother   . Cancer Father    Social History   Social History  . Marital status: Married    Spouse name: N/A  . Number of children: N/A  . Years of education: N/A   Occupational History  . Driver    Social History Main Topics  . Smoking status: Former Smoker    Years: 43.00    Types: Cigarettes    Quit date: 07/21/2002  . Smokeless tobacco: Never Used  . Alcohol use No  . Drug use: No     Comment: 26 years ago cocaine, heroin. methadone  . Sexual activity: Yes     Comment: number of sex partners in the last 26 months  1   Other Topics Concern  . Not on file   Social History Narrative      Married to [employer]. Exercise cardio daily for 30 minutes. Education: [employer].   Current Meds  Medication Sig  . aspirin EC 81 MG tablet Take 1 tablet (81 mg total) by mouth daily.  Marland Kitchen atorvastatin (LIPITOR) 20 MG tablet Take 1  tablet (20 mg total) by mouth daily.  . clotrimazole-betamethasone (LOTRISONE) cream   . dutasteride (AVODART) 0.5 MG capsule Take 0.5 mg by mouth daily.  . Fluticasone-Umeclidin-Vilant (TRELEGY ELLIPTA) 100-62.5-25 MCG/INH AEPB Inhale 1 puff into the lungs daily.  Marland Kitchen glucosamine-chondroitin 500-400 MG tablet Take 3 tablets by mouth 3 (three) times daily.  . metaxalone (SKELAXIN) 800 MG tablet Take 1 tablet (800 mg total) by mouth 3 (three) times daily.  . mupirocin ointment (BACTROBAN) 2 % Applied to the irritated area twice a day  . Omega-3 Fatty Acids (FISH OIL) 1000 MG CAPS Take 1 capsule by mouth 3 (three) times daily.   . Tamsulosin HCl (FLOMAX) 0.4 MG CAPS Take 0.4 mg by mouth daily after breakfast.   No Known Allergies    ROS: As per HPI, remainder of ROS negative.   OBJECTIVE:   Vitals:   04/02/17 1352  BP: 139/79  Pulse: 75  Temp: 98.5 F (36.9 C)  TempSrc: Oral  SpO2: 94%      General appearance: alert; no distress Eyes:  conjunctiva normal HENT: normocephalic; atraumatic;  Neck: supple, no cervical lymphadenopathy  Lungs: Diffuse expiratory wheeze heard  throughout Heart: regular rate and rhythm Abdomen: soft, non-tender;  Extremities: no cyanosis or edema; symmetrical with no gross deformities Skin: warm and dry Neurologic: normal gait; grossly normal Psychological: alert and cooperative; normal mood and affect      Labs:   Labs Reviewed - No data to display  No results found.     ASSESSMENT & PLAN:  1. Acute bronchitis, unspecified organism     Meds ordered this encounter  Medications  . DISCONTD: albuterol (PROVENTIL HFA;VENTOLIN HFA) 108 (90 Base) MCG/ACT inhaler    Sig: Inhale 1-2 puffs into the lungs every 6 (six) hours as needed for wheezing or shortness of breath.    Dispense:  1 Inhaler    Refill:  0  . azithromycin (ZITHROMAX) 250 MG tablet    Sig: Take 1 tablet (250 mg total) by mouth daily. Take first 2 tablets together, then  1 every day until finished.    Dispense:  6 tablet    Refill:  0  . predniSONE (DELTASONE) 50 MG tablet    Sig: Take 1 tablet daily with food    Dispense:  5 tablet    Refill:  0   Follow-up with primary care as needed Reviewed expectations re: course of current medical issues. Questions answered. Outlined signs and symptoms indicating need for more acute intervention. Patient verbalized understanding. After Visit Summary given.    Procedures:        Barnet Glasgow, NP 04/02/17 1512

## 2017-04-02 NOTE — Discharge Instructions (Signed)
I am treating you for bronchitis. I have prescribed Azithromycin. Take 2 tablets today, then 1 tablet daily till finished. I have also prescribed a steroid called prednisone. Take one tablet daily with food.  Should your symptoms fail to improve or worsen, follow up with your primary care provider, or return to clinic.

## 2017-07-06 ENCOUNTER — Other Ambulatory Visit: Payer: Self-pay | Admitting: Internal Medicine

## 2017-07-06 ENCOUNTER — Ambulatory Visit (HOSPITAL_COMMUNITY)
Admission: EM | Admit: 2017-07-06 | Discharge: 2017-07-06 | Disposition: A | Discharge status: Home / Self Care | Payer: PRIVATE HEALTH INSURANCE | Attending: Family Medicine | Admitting: Family Medicine

## 2017-07-06 ENCOUNTER — Encounter (HOSPITAL_COMMUNITY): Payer: Self-pay | Admitting: *Deleted

## 2017-07-06 DIAGNOSIS — E785 Hyperlipidemia, unspecified: Secondary | ICD-10-CM

## 2017-07-06 DIAGNOSIS — J40 Bronchitis, not specified as acute or chronic: Secondary | ICD-10-CM | POA: Diagnosis not present

## 2017-07-06 MED ORDER — PREDNISONE 50 MG PO TABS: ORAL_TABLET | ORAL | 0 refills | Status: DC

## 2017-07-06 MED ORDER — AZITHROMYCIN 250 MG PO TABS: 250.0000 mg | ORAL_TABLET | Freq: Every day | ORAL | 0 refills | Status: DC

## 2017-07-06 NOTE — Discharge Instructions (Signed)
We are giving you an antibiotic and steroids to help with the inflammation in your lungs and bronchitis.   Please take azithromycin- 2 tablets today, then 1 tablet for the following 4 days  Please take prednisone- 1 tablet daily.  Please rinse and use mouth wash after inhaler use. You may use pro air as needed for shortness of breath.   Please return if symptoms not improving with treatment, develop worsening shortness of breath, chest pain or fevers.

## 2017-07-06 NOTE — ED Provider Notes (Signed)
[place]    CSN: [id number] Arrival date & time: 07/06/17  1023     History   Chief Complaint Chief Complaint  Patient presents with  . Cough    HPI George Watson is a 71 y.o. male history of COPD presenting with concern for developing Bronchitis. He has had 1 days of shortness of breath, increased dyspnea, and congestion. Mild cough. No rhinorrhea, sore throat or headache/pressure. Uses Trelegy for COPD and Proair. No fevers, nausea, vomiting, diarrhea, abdominal pain.   HPI  Past Medical History:  Diagnosis Date  . BPH (benign prostatic hypertrophy)   . COPD (chronic obstructive pulmonary disease) ([place])   . DDD (degenerative disc disease), lumbosacral   . Elevated PSA   . Essential hypertension, benign    diet controlled  . Fracture of shaft of clavicle   . GERD (gastroesophageal reflux disease)   . H/O drug abuse    multisubstance  . Hepatitis C 10/1996  . HSV-2 (herpes simplex virus 2) infection   . Hx of biopsy 08/2004   liver  . Inguinal hernia    right  . Other and unspecified hyperlipidemia   . Prostate cancer ([place])   . Substance abuse ([place])   . Tuberculosis     Patient Active Problem List   Diagnosis Date Noted  . Routine general medical examination at a health care facility 01/07/2017  . Hyperglycemia 01/06/2017  . Leukocytosis 01/06/2017  . Atherosclerosis of aorta ([place]) 07/09/2016  . Degeneration of lumbar or lumbosacral intervertebral disc 02/15/2014  . Ureteral calculus, right 02/02/2012  . Essential hypertension, benign   . H/O drug abuse   . Hyperlipidemia with target LDL less than 130   . HSV-2 (herpes simplex virus 2) infection   . COPD (chronic obstructive pulmonary disease) ([place])   . Benign prostatic hyperplasia   . DDD (degenerative disc disease), lumbosacral   . Prostate cancer ([place])   . Hepatitis C 10/19/1996    Past Surgical History:  Procedure Laterality Date  . CERVICAL SPINE SURGERY    . fracture ribs     3   . HERNIA REPAIR    . INGUINAL HERNIA REPAIR  07/01/2012   Procedure: HERNIA REPAIR INGUINAL ADULT;  Surgeon: Madilyn Hook, DO;  Location: [facility];  Service: General;  Laterality: Right;  with Mesh  . left knee meniscus repair  1992  . right rotator cuff  1992  . right shoulder arthroscopy  2011       Home Medications    Prior to Admission medications   Medication Sig Start Date End Date Taking? Authorizing Provider  aspirin EC 81 MG tablet Take 1 tablet (81 mg total) by mouth daily. 07/08/16  Yes Janith Lima, MD  dutasteride (AVODART) 0.5 MG capsule Take 0.5 mg by mouth daily.   Yes [provider]  Fluticasone-Umeclidin-Vilant (TRELEGY ELLIPTA) 100-62.5-25 MCG/INH AEPB Inhale 1 puff into the lungs daily. 01/06/17  Yes Janith Lima, MD  glucosamine-chondroitin 500-400 MG tablet Take 3 tablets by mouth 3 (three) times daily.   Yes [provider]  Multiple Vitamins-Minerals (CENTRUM SILVER PO) Take by mouth.   Yes [provider]  Omega-3 Fatty Acids (FISH OIL) 1000 MG CAPS Take 1 capsule by mouth 3 (three) times daily.    Yes [provider]  Tamsulosin HCl (FLOMAX) 0.4 MG CAPS Take 0.4 mg by mouth daily after breakfast.   Yes [provider]  atorvastatin (LIPITOR) 20 MG tablet TAKE 1 TABLET BY  MOUTH EVERY DAY 07/06/17   Janith Lima, MD  azithromycin (ZITHROMAX) 250 MG tablet Take 1 tablet (250 mg total) by mouth daily for 5 days. Take first 2 tablets together, then 1 every day until finished. 07/06/17 07/11/17  General Wearing, Elesa Hacker, PA-C  clotrimazole-betamethasone (LOTRISONE) cream  05/23/16   [provider]  metaxalone (SKELAXIN) 800 MG tablet Take 1 tablet (800 mg total) by mouth 3 (three) times daily. 01/22/17   Janith Lima, MD  mupirocin ointment (BACTROBAN) 2 % Applied to the irritated area twice a day 01/25/16   Darlyne Russian, MD  predniSONE (DELTASONE) 50 MG tablet Take 1 tablet daily with food 07/06/17   Tayshun Gappa,  Elesa Hacker, PA-C    Family History Family History  Problem Relation Age of Onset  . Cancer Sister        breast  . Leukemia Mother   . Cancer Father     Social History Social History   Tobacco Use  . Smoking status: Former Smoker    Years: 43.00    Types: Cigarettes    Last attempt to quit: 07/21/2002    Years since quitting: 14.9  . Smokeless tobacco: Never Used  Substance Use Topics  . Alcohol use: No  . Drug use: No    Comment: 26 years ago cocaine, heroin. methadone     Allergies   Patient has no known allergies.   Review of Systems Review of Systems  Constitutional: Negative for chills and fever.  HENT: Negative for ear pain and sore throat.   Eyes: Negative for pain and visual disturbance.  Respiratory: Positive for cough, chest tightness and shortness of breath.   Cardiovascular: Negative for chest pain and palpitations.  Gastrointestinal: Negative for abdominal pain, diarrhea, nausea and vomiting.  Genitourinary: Negative for dysuria and hematuria.  Musculoskeletal: Negative for arthralgias and back pain.  Skin: Negative for color change and rash.  Neurological: Negative for dizziness, syncope, weakness, light-headedness and headaches.  All other systems reviewed and are negative.    Physical Exam Triage Vital Signs ED Triage Vitals [07/06/17 1119]  Enc Vitals Group     BP (!) 149/86     Pulse Rate 70     Resp      Temp 98.1 F (36.7 C)     Temp Source Oral     SpO2 99 %     Weight      Height      Head Circumference      Peak Flow      Pain Score      Pain Loc      Pain Edu?      Excl. in [place]?    No data found.  Updated Vital Signs BP (!) 149/86 (BP Location: Left Arm)   Pulse 70   Temp 98.1 F (36.7 C) (Oral)   SpO2 99%    Physical Exam  Constitutional: He appears well-developed and well-nourished.  HENT:  Head: Normocephalic and atraumatic.  Mouth/Throat: Uvula is midline. Oral lesions present. No trismus in the jaw. No uvula  swelling. Posterior oropharyngeal erythema present. No oropharyngeal exudate.    Eyes: Conjunctivae are normal.  Neck: Neck supple.  Cardiovascular: Normal rate and regular rhythm.  No murmur heard. Pulmonary/Chest: Effort normal. No respiratory distress. He has wheezes in the left upper field and the left lower field. He has rhonchi.  Abdominal: Soft. There is no tenderness.  Musculoskeletal: He exhibits no edema.  Neurological: He is alert.  Skin:  Skin is warm and dry.  Psychiatric: He has a normal mood and affect.  Nursing note and vitals reviewed.     UC Treatments / Results  Labs (all labs ordered are listed, but only abnormal results are displayed) Labs Reviewed - No data to display  EKG  EKG Interpretation None       Radiology No results found.  Procedures Procedures (including critical care time)  Medications Ordered in UC Medications - No data to display   Initial Impression / Assessment and Plan / UC Course  I have reviewed the triage vital signs and the nursing notes.  Pertinent labs & imaging results that were available during my care of the patient were reviewed by me and considered in my medical decision making (see chart for details).     Patient with early acute bronchitis, with history of COPD, treated with azithromycin and prednisone, encouraged albuterol use for increased dyspnea, also rinsing with mouthwash after inhaler use. Discussed return precautions to include development of increased SOB, dyspnea, fever, chest pain. Patient verbalized understanding and is agreeable with plan.   Final Clinical Impressions(s) / UC Diagnoses   Final diagnoses:  Bronchitis    ED Discharge Orders        Ordered    azithromycin (ZITHROMAX) 250 MG tablet  Daily     07/06/17 1145    predniSONE (DELTASONE) 50 MG tablet     07/06/17 1145       Controlled Substance Prescriptions [place] Controlled Substance Registry consulted? Not Applicable   Janith Lima, [place] 07/06/17 1234

## 2017-07-06 NOTE — ED Triage Notes (Signed)
Per pt he is having Bronchitis flare up, per pt he is having heaviness in the chest and feeling weak

## 2017-07-23 ENCOUNTER — Other Ambulatory Visit: Payer: Self-pay

## 2017-07-23 ENCOUNTER — Ambulatory Visit (HOSPITAL_COMMUNITY)
Admission: EM | Admit: 2017-07-23 | Discharge: 2017-07-23 | Disposition: A | Discharge status: Home / Self Care | Payer: PRIVATE HEALTH INSURANCE | Attending: Physician Assistant | Admitting: Physician Assistant

## 2017-07-23 ENCOUNTER — Ambulatory Visit (INDEPENDENT_AMBULATORY_CARE_PROVIDER_SITE_OTHER): Payer: PRIVATE HEALTH INSURANCE

## 2017-07-23 ENCOUNTER — Encounter (HOSPITAL_COMMUNITY): Payer: Self-pay | Admitting: Emergency Medicine

## 2017-07-23 DIAGNOSIS — J40 Bronchitis, not specified as acute or chronic: Secondary | ICD-10-CM

## 2017-07-23 MED ORDER — PREDNISONE 10 MG PO TABS
ORAL_TABLET | ORAL | 0 refills | Status: DC
Start: 2017-07-23 — End: 2017-09-30

## 2017-07-23 MED ORDER — AZITHROMYCIN 250 MG PO TABS: 250.0000 mg | ORAL_TABLET | Freq: Every day | ORAL | 0 refills | Status: DC

## 2017-07-23 NOTE — Discharge Instructions (Signed)
See your Physician for recheck in 1 week  °

## 2017-07-23 NOTE — ED Provider Notes (Signed)
[place]    CSN: [id number] Arrival date & time: 07/23/17  1221     History   Chief Complaint Chief Complaint  Patient presents with  . Cough    HPI DIN BOOKWALTER is a 72 y.o. male.   The history is provided by the patient. No language interpreter was used.  Cough  Cough characteristics:  Productive Sputum characteristics:  Nondescript Severity:  Moderate Onset quality:  Gradual Progression:  Worsening Chronicity:  New Smoker: no   Context: not sick contacts   Relieved by:  Nothing Worsened by:  Nothing Ineffective treatments:  None tried Associated symptoms: no fever   Pt complains of a cough and congestion.  Pt reports he has not had a fever.  Pt reports he gets frequent bronchitis and responds well to zithromax and prednisone  Past Medical History:  Diagnosis Date  . BPH (benign prostatic hypertrophy)   . COPD (chronic obstructive pulmonary disease) ([place])   . DDD (degenerative disc disease), lumbosacral   . Elevated PSA   . Essential hypertension, benign    diet controlled  . Fracture of shaft of clavicle   . GERD (gastroesophageal reflux disease)   . H/O drug abuse    multisubstance  . Hepatitis C 10/1996  . HSV-2 (herpes simplex virus 2) infection   . Hx of biopsy 08/2004   liver  . Inguinal hernia    right  . Other and unspecified hyperlipidemia   . Prostate cancer ([place])   . Substance abuse ([place])   . Tuberculosis     Patient Active Problem List   Diagnosis Date Noted  . Routine general medical examination at a health care facility 01/07/2017  . Hyperglycemia 01/06/2017  . Leukocytosis 01/06/2017  . Atherosclerosis of aorta ([place]) 07/09/2016  . Degeneration of lumbar or lumbosacral intervertebral disc 02/15/2014  . Ureteral calculus, right 02/02/2012  . Essential hypertension, benign   . H/O drug abuse   . Hyperlipidemia with target LDL less than 130   . HSV-2 (herpes simplex virus 2) infection   . COPD (chronic obstructive  pulmonary disease) ([place])   . Benign prostatic hyperplasia   . DDD (degenerative disc disease), lumbosacral   . Prostate cancer ([place])   . Hepatitis C 10/19/1996    Past Surgical History:  Procedure Laterality Date  . CERVICAL SPINE SURGERY    . fracture ribs     3  . HERNIA REPAIR    . INGUINAL HERNIA REPAIR  07/01/2012   Procedure: HERNIA REPAIR INGUINAL ADULT;  Surgeon: Madilyn Hook, DO;  Location: [facility];  Service: General;  Laterality: Right;  with Mesh  . left knee meniscus repair  1992  . right rotator cuff  1992  . right shoulder arthroscopy  2011       Home Medications    Prior to Admission medications   Medication Sig Start Date End Date Taking? Authorizing Provider  aspirin EC 81 MG tablet Take 1 tablet (81 mg total) by mouth daily. 07/08/16  Yes Janith Lima, MD  atorvastatin (LIPITOR) 20 MG tablet TAKE 1 TABLET BY MOUTH EVERY DAY 07/06/17  Yes Janith Lima, MD  clotrimazole-betamethasone (LOTRISONE) cream  05/23/16  Yes [provider]  dutasteride (AVODART) 0.5 MG capsule Take 0.5 mg by mouth daily.   Yes [provider]  Fluticasone-Umeclidin-Vilant (TRELEGY ELLIPTA) 100-62.5-25 MCG/INH AEPB Inhale 1 puff into the lungs daily. 01/06/17  Yes Janith Lima, MD  glucosamine-chondroitin 500-400 MG tablet Take 3 tablets by  mouth 3 (three) times daily.   Yes [provider]  metaxalone (SKELAXIN) 800 MG tablet Take 1 tablet (800 mg total) by mouth 3 (three) times daily. 01/22/17  Yes Janith Lima, MD  Multiple Vitamins-Minerals (CENTRUM SILVER PO) Take by mouth.   Yes [provider]  mupirocin ointment (BACTROBAN) 2 % Applied to the irritated area twice a day 01/25/16  Yes Daub, Loura Back, MD  Omega-3 Fatty Acids (FISH OIL) 1000 MG CAPS Take 1 capsule by mouth 3 (three) times daily.    Yes [provider]  Tamsulosin HCl (FLOMAX) 0.4 MG CAPS Take 0.4 mg by mouth daily after breakfast.   Yes [provider]    predniSONE (DELTASONE) 50 MG tablet Take 1 tablet daily with food 07/06/17   Wieters, [place] C, PA-C    Family History Family History  Problem Relation Age of Onset  . Cancer Sister        breast  . Leukemia Mother   . Cancer Father     Social History Social History   Tobacco Use  . Smoking status: Former Smoker    Years: 43.00    Types: Cigarettes    Last attempt to quit: 07/21/2002    Years since quitting: 15.0  . Smokeless tobacco: Never Used  Substance Use Topics  . Alcohol use: No  . Drug use: No    Comment: 26 years ago cocaine, heroin. methadone     Allergies   Patient has no known allergies.   Review of Systems Review of Systems  Constitutional: Negative for fever.  Respiratory: Positive for cough.   All other systems reviewed and are negative.    Physical Exam Triage Vital Signs ED Triage Vitals [07/23/17 1321]  Enc Vitals Group     BP 132/73     Pulse Rate 74     Resp      Temp 98.2 F (36.8 C)     Temp Source Oral     SpO2 98 %     Weight      Height      Head Circumference      Peak Flow      Pain Score      Pain Loc      Pain Edu?      Excl. in [place]?    No data found.  Updated Vital Signs BP 132/73 (BP Location: Left Arm)   Pulse 74   Temp 98.2 F (36.8 C) (Oral)   SpO2 98%   Visual Acuity Right Eye Distance:   Left Eye Distance:   Bilateral Distance:    Right Eye Near:   Left Eye Near:    Bilateral Near:     Physical Exam  Constitutional: He appears well-developed and well-nourished.  HENT:  Head: Normocephalic and atraumatic.  Eyes: Conjunctivae are normal.  Neck: Neck supple.  Cardiovascular: Normal rate and regular rhythm.  No murmur heard. Pulmonary/Chest: Effort normal and breath sounds normal. No respiratory distress.  rhonchi  Abdominal: Soft. There is no tenderness.  Musculoskeletal: He exhibits no edema.  Neurological: He is alert.  Skin: Skin is warm and dry.  Psychiatric: He has a normal mood and  affect.  Nursing note and vitals reviewed.    UC Treatments / Results  Labs (all labs ordered are listed, but only abnormal results are displayed) Labs Reviewed - No data to display  EKG  EKG Interpretation None       Radiology Dg Chest 2 View  Result Date: 07/23/2017 CLINICAL DATA:  Five days of cough and wheezing without fever. History of emphysema, tuberculosis, and previous episodes of pneumonia. Discontinued smoking 15 years ago. EXAM: CHEST  2 VIEW COMPARISON:  Chest x-ray of January 08, 2015 FINDINGS: The lungs are adequately inflated. The interstitial markings are coarse bilaterally. There is no alveolar infiltrate or pleural effusion. There is stable biapical pleural thickening. The heart and pulmonary vascularity are normal. There is calcification in the wall of the aortic arch. The bony thorax exhibits no acute abnormality. IMPRESSION: Mild interstitial prominence bilaterally may reflect changes in radiographic technique since the previous study, but chronic bronchitic changes with superimposed acute bronchitis could produce similar findings. There is no evidence of CHF or alveolar pneumonia. Thoracic aortic atherosclerosis. Electronically Signed   By: David  Martinique M.D.   On: 07/23/2017 15:08    Procedures Procedures (including critical care time)  Medications Ordered in UC Medications - No data to display   Initial Impression / Assessment and Plan / UC Course  I have reviewed the triage vital signs and the nursing notes.  Pertinent labs & imaging results that were available during my care of the patient were reviewed by me and considered in my medical decision making (see chart for details).       Final Clinical Impressions(s) / UC Diagnoses   Final diagnoses:  Bronchitis    ED Discharge Orders        Ordered    predniSONE (DELTASONE) 10 MG tablet     07/23/17 1524    azithromycin (ZITHROMAX) 250 MG tablet  Daily     07/23/17 1524     An After Visit  Summary was printed and given to the patient.  Controlled Substance Prescriptions [place] Controlled Substance Registry consulted? Not Applicable  An After Visit Summary was printed and given to the patient.   Fransico Meadow, [place] 07/23/17 2007

## 2017-07-23 NOTE — ED Triage Notes (Signed)
Pt was diagnosed with bronchitis on December 17.  He states he completed all medications and was beginning to feel better but then started feeling bad again three days ago.  Same symptoms.

## 2017-07-28 ENCOUNTER — Other Ambulatory Visit: Payer: Self-pay | Admitting: Internal Medicine

## 2017-07-28 DIAGNOSIS — J411 Mucopurulent chronic bronchitis: Secondary | ICD-10-CM

## 2017-08-24 LAB — HM COLONOSCOPY

## 2017-08-27 ENCOUNTER — Encounter: Payer: Self-pay | Admitting: Internal Medicine

## 2017-08-31 DIAGNOSIS — M75101 Unspecified rotator cuff tear or rupture of right shoulder, not specified as traumatic: Secondary | ICD-10-CM | POA: Insufficient documentation

## 2017-09-04 ENCOUNTER — Encounter: Payer: Self-pay | Admitting: Internal Medicine

## 2017-09-30 ENCOUNTER — Other Ambulatory Visit: Payer: Self-pay

## 2017-09-30 ENCOUNTER — Telehealth (HOSPITAL_COMMUNITY): Payer: Self-pay | Admitting: Emergency Medicine

## 2017-09-30 ENCOUNTER — Ambulatory Visit (HOSPITAL_COMMUNITY)
Admission: EM | Admit: 2017-09-30 | Discharge: 2017-09-30 | Disposition: A | Discharge status: Home / Self Care | Payer: PRIVATE HEALTH INSURANCE | Attending: Family Medicine | Admitting: Family Medicine

## 2017-09-30 ENCOUNTER — Encounter (HOSPITAL_COMMUNITY): Payer: Self-pay | Admitting: Emergency Medicine

## 2017-09-30 DIAGNOSIS — J441 Chronic obstructive pulmonary disease with (acute) exacerbation: Secondary | ICD-10-CM

## 2017-09-30 DIAGNOSIS — J40 Bronchitis, not specified as acute or chronic: Secondary | ICD-10-CM

## 2017-09-30 MED ORDER — AZITHROMYCIN 250 MG PO TABS: 250.0000 mg | ORAL_TABLET | Freq: Every day | ORAL | 0 refills | Status: DC

## 2017-09-30 MED ORDER — PREDNISONE 50 MG PO TABS: 50.0000 mg | ORAL_TABLET | Freq: Every day | ORAL | 0 refills | Status: AC

## 2017-09-30 NOTE — ED Provider Notes (Addendum)
[place]    CSN: [id number] Arrival date & time: 09/30/17  1016     History   Chief Complaint Chief Complaint  Patient presents with  . URI    HPI George Watson is a 72 y.o. male history of COPD, hypertension presenting today with increased sputum production and cough.  States that symptoms began approximately 3-4 days ago.  Has not had any fevers.  Denies nausea vomiting.  He has had increased wheezing and slight short of breath.  States he gets the same thing every few months or so, usually improves with azithromycin and prednisone.  Uses trilogy daily and Pro Air as needed for COPD.  HPI  Past Medical History:  Diagnosis Date  . BPH (benign prostatic hypertrophy)   . COPD (chronic obstructive pulmonary disease) ([place])   . DDD (degenerative disc disease), lumbosacral   . Elevated PSA   . Essential hypertension, benign    diet controlled  . Fracture of shaft of clavicle   . GERD (gastroesophageal reflux disease)   . H/O drug abuse    multisubstance  . Hepatitis C 10/1996  . HSV-2 (herpes simplex virus 2) infection   . Hx of biopsy 08/2004   liver  . Inguinal hernia    right  . Other and unspecified hyperlipidemia   . Prostate cancer ([place])   . Substance abuse ([place])   . Tuberculosis     Patient Active Problem List   Diagnosis Date Noted  . Routine general medical examination at a health care facility 01/07/2017  . Hyperglycemia 01/06/2017  . Leukocytosis 01/06/2017  . Atherosclerosis of aorta ([place]) 07/09/2016  . Degeneration of lumbar or lumbosacral intervertebral disc 02/15/2014  . Ureteral calculus, right 02/02/2012  . Essential hypertension, benign   . H/O drug abuse   . Hyperlipidemia with target LDL less than 130   . HSV-2 (herpes simplex virus 2) infection   . COPD (chronic obstructive pulmonary disease) ([place])   . Benign prostatic hyperplasia   . DDD (degenerative disc disease), lumbosacral   . Prostate cancer ([place])   . Hepatitis C  10/19/1996    Past Surgical History:  Procedure Laterality Date  . CERVICAL SPINE SURGERY    . fracture ribs     3  . HERNIA REPAIR    . INGUINAL HERNIA REPAIR  07/01/2012   Procedure: HERNIA REPAIR INGUINAL ADULT;  Surgeon: Madilyn Hook, DO;  Location: [facility];  Service: General;  Laterality: Right;  with Mesh  . left knee meniscus repair  1992  . right rotator cuff  1992  . right shoulder arthroscopy  2011       Home Medications    Prior to Admission medications   Medication Sig Start Date End Date Taking? Authorizing Provider  aspirin EC 81 MG tablet Take 1 tablet (81 mg total) by mouth daily. 07/08/16  Yes Janith Lima, MD  dutasteride (AVODART) 0.5 MG capsule Take 0.5 mg by mouth daily.   Yes [provider]  glucosamine-chondroitin 500-400 MG tablet Take 3 tablets by mouth 3 (three) times daily.   Yes [provider]  metaxalone (SKELAXIN) 800 MG tablet Take 1 tablet (800 mg total) by mouth 3 (three) times daily. 01/22/17  Yes Janith Lima, MD  Multiple Vitamins-Minerals (CENTRUM SILVER PO) Take by mouth.   Yes [provider]  Omega-3 Fatty Acids (FISH OIL) 1000 MG CAPS Take 1 capsule by mouth 3 (three) times daily.    Yes [provider]  Tamsulosin HCl (FLOMAX) 0.4 MG CAPS Take 0.4 mg by mouth daily after breakfast.   Yes [provider]  TRELEGY ELLIPTA 100-62.5-25 MCG/INH AEPB INHALE 1 PUFF INTO THE LUNGS DAILY 07/28/17  Yes Janith Lima, MD  atorvastatin (LIPITOR) 20 MG tablet TAKE 1 TABLET BY MOUTH EVERY DAY Patient not taking: Reported on 09/30/2017 07/06/17   Janith Lima, MD  azithromycin (ZITHROMAX Z-PAK) 250 MG tablet Take 1 tablet (250 mg total) by mouth daily for 5 days. 09/30/17 10/05/17  Wieters, Madelynn Done C, PA-C  clotrimazole-betamethasone (LOTRISONE) cream  05/23/16   [provider]  mupirocin ointment (BACTROBAN) 2 % Applied to the irritated area twice a day Patient not taking: Reported on 09/30/2017  01/25/16   Darlyne Russian, MD  predniSONE (DELTASONE) 50 MG tablet Take 1 tablet (50 mg total) by mouth daily for 5 days. 09/30/17 10/05/17  Wieters, Elesa Hacker, PA-C    Family History Family History  Problem Relation Age of Onset  . Cancer Sister        breast  . Leukemia Mother   . Cancer Father     Social History Social History   Tobacco Use  . Smoking status: Former Smoker    Years: 43.00    Types: Cigarettes    Last attempt to quit: 07/21/2002    Years since quitting: 15.2  . Smokeless tobacco: Never Used  Substance Use Topics  . Alcohol use: No  . Drug use: No    Comment: 26 years ago cocaine, heroin. methadone     Allergies   Patient has no known allergies.   Review of Systems Review of Systems  Constitutional: Positive for fatigue. Negative for activity change, appetite change and fever.  HENT: Negative for congestion, ear pain, postnasal drip, rhinorrhea, sinus pressure and sore throat.   Eyes: Negative for pain and itching.  Respiratory: Positive for cough, shortness of breath and wheezing.   Cardiovascular: Negative for chest pain.  Gastrointestinal: Negative for abdominal pain, diarrhea, nausea and vomiting.  Musculoskeletal: Negative for myalgias.  Skin: Negative for rash.  Neurological: Negative for dizziness, light-headedness and headaches.     Physical Exam Triage Vital Signs ED Triage Vitals [09/30/17 1109]  Enc Vitals Group     BP (!) 150/83     Pulse Rate 67     Resp 18     Temp 98.4 F (36.9 C)     Temp src      SpO2 97 %     Weight      Height      Head Circumference      Peak Flow      Pain Score      Pain Loc      Pain Edu?      Excl. in [place]?    No data found.  Updated Vital Signs BP (!) 150/83   Pulse 67   Temp 98.4 F (36.9 C)   Resp 18   SpO2 97%   Visual Acuity Right Eye Distance:   Left Eye Distance:   Bilateral Distance:    Right Eye Near:   Left Eye Near:    Bilateral Near:     Physical Exam  Constitutional:  He appears well-developed and well-nourished.  HENT:  Head: Normocephalic and atraumatic.  Mouth/Throat: Oropharynx is clear and moist.  Eyes: Conjunctivae are normal.  Neck: Neck supple.  Cardiovascular: Normal rate and regular rhythm.  No murmur heard. Pulmonary/Chest: Effort normal. No respiratory distress. He has wheezes.  Mild diffuse wheezing throughout bilateral lung fields, coarse or rhonchi to left lower lobe  Abdominal: Soft. There is no tenderness.  Musculoskeletal: He exhibits no edema.  Neurological: He is alert.  Skin: Skin is warm and dry.  Psychiatric: He has a normal mood and affect.  Nursing note and vitals reviewed.    UC Treatments / Results  Labs (all labs ordered are listed, but only abnormal results are displayed) Labs Reviewed - No data to display  EKG  EKG Interpretation None       Radiology No results found.  Procedures Procedures (including critical care time)  Medications Ordered in UC Medications - No data to display   Initial Impression / Assessment and Plan / UC Course  I have reviewed the triage vital signs and the nursing notes.  Pertinent labs & imaging results that were available during my care of the patient were reviewed by me and considered in my medical decision making (see chart for details).     Patient likely with COPD exacerbation/chronic bronchitis.  Given increase in purulent production, shortness of breath will provide azithromycin.  Prednisone to help with COPD.  Offered breathing treatment in clinic, patient declined as he needed to return to work. Discussed strict return precautions. Patient verbalized understanding and is agreeable with plan.   Final Clinical Impressions(s) / UC Diagnoses   Final diagnoses:  Bronchitis  COPD exacerbation [facility])    ED Discharge Orders        Ordered    azithromycin (ZITHROMAX Z-PAK) 250 MG tablet  Daily     09/30/17 1128    predniSONE (DELTASONE) 50 MG tablet  Daily      09/30/17 1128       Controlled Substance Prescriptions [place] Controlled Substance Registry consulted? Not Applicable   Janith Lima, PA-C 09/30/17 [address], [place] C, [place] 09/30/17 1134

## 2017-09-30 NOTE — ED Triage Notes (Signed)
Pt c/o upper respiratory issues, states hes got some "gunk in my lungs", coughing up green stuff x3-4. Denies fevers.

## 2017-09-30 NOTE — Discharge Instructions (Signed)
Please begin azithromycin- 2 tablets today then 1 tablet the following 4 days  Prednisone daily for the next 5 days.

## 2017-09-30 NOTE — Telephone Encounter (Signed)
Pharmacy sent a fax to clarify dosing of azithromycin.  Spoke to prescriber and dose first day was changed 2 2 pills, followed by one pill on days 2-day5.  Change in dosing by hallie wieters, pa

## 2017-10-11 ENCOUNTER — Encounter: Payer: Self-pay | Admitting: Internal Medicine

## 2017-10-12 ENCOUNTER — Encounter: Payer: Self-pay | Admitting: Internal Medicine

## 2017-10-14 ENCOUNTER — Encounter: Payer: Self-pay | Admitting: Internal Medicine

## 2017-10-29 ENCOUNTER — Telehealth: Payer: Self-pay

## 2017-10-29 NOTE — Telephone Encounter (Signed)
Key: [id number]  Submitted today via Cover My Meds. If Christella Scheuermann has not responded in 120 hours, contact [employer] at [phone].

## 2017-11-05 ENCOUNTER — Other Ambulatory Visit: Payer: Self-pay | Admitting: Gastroenterology

## 2017-11-05 DIAGNOSIS — B182 Chronic viral hepatitis C: Secondary | ICD-10-CM

## 2017-11-09 NOTE — Telephone Encounter (Signed)
PA for Trelegy was approved. Can you inform pt of same please.

## 2017-11-10 NOTE — Telephone Encounter (Signed)
Called patient and informed. He said to tell you all Thank you!

## 2017-11-20 ENCOUNTER — Ambulatory Visit
Admission: RE | Admit: 2017-11-20 | Discharge: 2017-11-20 | Disposition: A | Discharge status: Home / Self Care | Payer: PRIVATE HEALTH INSURANCE | Source: Ambulatory Visit | Attending: Gastroenterology | Admitting: Gastroenterology

## 2017-11-20 DIAGNOSIS — B182 Chronic viral hepatitis C: Secondary | ICD-10-CM

## 2017-12-01 ENCOUNTER — Encounter: Payer: Self-pay | Admitting: Internal Medicine

## 2017-12-10 ENCOUNTER — Encounter (INDEPENDENT_AMBULATORY_CARE_PROVIDER_SITE_OTHER): Payer: Self-pay

## 2017-12-19 ENCOUNTER — Encounter: Payer: Self-pay | Admitting: Internal Medicine

## 2017-12-24 ENCOUNTER — Ambulatory Visit: Payer: PRIVATE HEALTH INSURANCE | Admitting: Internal Medicine

## 2018-01-04 DIAGNOSIS — C61 Malignant neoplasm of prostate: Secondary | ICD-10-CM | POA: Diagnosis not present

## 2018-01-04 LAB — PSA: PSA: 1

## 2018-01-05 LAB — PSA: PSA: 1

## 2018-01-08 DIAGNOSIS — C61 Malignant neoplasm of prostate: Secondary | ICD-10-CM | POA: Diagnosis not present

## 2018-01-08 DIAGNOSIS — R351 Nocturia: Secondary | ICD-10-CM | POA: Diagnosis not present

## 2018-01-08 DIAGNOSIS — N401 Enlarged prostate with lower urinary tract symptoms: Secondary | ICD-10-CM | POA: Diagnosis not present

## 2018-01-08 DIAGNOSIS — R350 Frequency of micturition: Secondary | ICD-10-CM | POA: Diagnosis not present

## 2018-01-11 DIAGNOSIS — M47817 Spondylosis without myelopathy or radiculopathy, lumbosacral region: Secondary | ICD-10-CM | POA: Diagnosis not present

## 2018-01-11 DIAGNOSIS — M9902 Segmental and somatic dysfunction of thoracic region: Secondary | ICD-10-CM | POA: Diagnosis not present

## 2018-01-11 DIAGNOSIS — M9901 Segmental and somatic dysfunction of cervical region: Secondary | ICD-10-CM | POA: Diagnosis not present

## 2018-01-11 DIAGNOSIS — M47814 Spondylosis without myelopathy or radiculopathy, thoracic region: Secondary | ICD-10-CM | POA: Diagnosis not present

## 2018-01-11 DIAGNOSIS — M9907 Segmental and somatic dysfunction of upper extremity: Secondary | ICD-10-CM | POA: Diagnosis not present

## 2018-01-11 DIAGNOSIS — M9903 Segmental and somatic dysfunction of lumbar region: Secondary | ICD-10-CM | POA: Diagnosis not present

## 2018-01-11 DIAGNOSIS — M25511 Pain in right shoulder: Secondary | ICD-10-CM | POA: Diagnosis not present

## 2018-02-02 DIAGNOSIS — M9902 Segmental and somatic dysfunction of thoracic region: Secondary | ICD-10-CM | POA: Diagnosis not present

## 2018-02-02 DIAGNOSIS — M9901 Segmental and somatic dysfunction of cervical region: Secondary | ICD-10-CM | POA: Diagnosis not present

## 2018-02-02 DIAGNOSIS — M25511 Pain in right shoulder: Secondary | ICD-10-CM | POA: Diagnosis not present

## 2018-02-02 DIAGNOSIS — M9903 Segmental and somatic dysfunction of lumbar region: Secondary | ICD-10-CM | POA: Diagnosis not present

## 2018-02-02 DIAGNOSIS — M9907 Segmental and somatic dysfunction of upper extremity: Secondary | ICD-10-CM | POA: Diagnosis not present

## 2018-02-02 DIAGNOSIS — M47814 Spondylosis without myelopathy or radiculopathy, thoracic region: Secondary | ICD-10-CM | POA: Diagnosis not present

## 2018-02-02 DIAGNOSIS — M47817 Spondylosis without myelopathy or radiculopathy, lumbosacral region: Secondary | ICD-10-CM | POA: Diagnosis not present

## 2018-02-09 ENCOUNTER — Encounter: Payer: Self-pay | Admitting: Internal Medicine

## 2018-02-11 ENCOUNTER — Other Ambulatory Visit: Payer: Self-pay | Admitting: Internal Medicine

## 2018-02-11 DIAGNOSIS — F40243 Fear of flying: Secondary | ICD-10-CM | POA: Insufficient documentation

## 2018-02-11 MED ORDER — DIAZEPAM 5 MG PO TABS: 5.0000 mg | ORAL_TABLET | Freq: Two times a day (BID) | ORAL | 1 refills | Status: DC | PRN

## 2018-02-12 DIAGNOSIS — B182 Chronic viral hepatitis C: Secondary | ICD-10-CM | POA: Diagnosis not present

## 2018-02-15 ENCOUNTER — Encounter: Payer: Self-pay | Admitting: Family

## 2018-02-15 ENCOUNTER — Ambulatory Visit (INDEPENDENT_AMBULATORY_CARE_PROVIDER_SITE_OTHER): Payer: PPO | Admitting: Family

## 2018-02-15 ENCOUNTER — Ambulatory Visit: Payer: Self-pay | Admitting: *Deleted

## 2018-02-15 VITALS — BP 148/82 | HR 82 | Temp 99.1°F | Ht 71.0 in | Wt 168.8 lb

## 2018-02-15 DIAGNOSIS — I10 Essential (primary) hypertension: Secondary | ICD-10-CM

## 2018-02-15 MED ORDER — GLUCOSAMINE-CHONDROITIN 500-400 MG PO TABS: 3.0000 | ORAL_TABLET | Freq: Every day | ORAL | Status: AC

## 2018-02-15 MED ORDER — OLMESARTAN MEDOXOMIL 20 MG PO TABS: 20.0000 mg | ORAL_TABLET | Freq: Every day | ORAL | 1 refills | Status: DC

## 2018-02-15 MED ORDER — TELMISARTAN 40 MG PO TABS: 40.0000 mg | ORAL_TABLET | Freq: Every day | ORAL | 1 refills | Status: DC

## 2018-02-15 NOTE — Telephone Encounter (Signed)
170/110 bp, wants an appt, none available with PCP  Patient is calling to state that he had been on a medication from urologist that lowered his BP and was able to stop the medication. He has since discontinued the medication and now has had increase in BP readings. He would like to get back on BP medication. Requesting appointment to restart immediately. Reason for Disposition . [7] Systolic BP  >= 673 OR Diastolic >= 80 AND [4] taking BP medications  Answer Assessment - Initial Assessment Questions 1. BLOOD PRESSURE: "What is the blood pressure?" "Did you take at least two measurements 5 minutes apart?"        157/95  155/83 P 72 2. ONSET: "When did you take your blood pressure?"     8:30 3. HOW: "How did you obtain the blood pressure?" (e.g., visiting nurse, automatic home BP monitor)     Automatic cuff 4. HISTORY: "Do you have a history of high blood pressure?"     yes 5. MEDICATIONS: "Are you taking any medications for blood pressure?" "Have you missed any doses recently?"     no 6. OTHER SYMPTOMS: "Do you have any symptoms?" (e.g., headache, chest pain, blurred vision, difficulty breathing, weakness)     no 7. PREGNANCY: "Is there any chance you are pregnant?" "When was your last menstrual period?"     n/a  Protocols used: HIGH BLOOD PRESSURE-A-AH

## 2018-02-15 NOTE — Patient Instructions (Signed)
DASH Eating Plan DASH stands for "Dietary Approaches to Stop Hypertension." The DASH eating plan is a healthy eating plan that has been shown to reduce high blood pressure (hypertension). It may also reduce your risk for type 2 diabetes, heart disease, and stroke. The DASH eating plan may also help with weight loss. What are tips for following this plan? General guidelines  Avoid eating more than 2,300 mg (milligrams) of salt (sodium) a day. If you have hypertension, you may need to reduce your sodium intake to 1,500 mg a day.  Limit alcohol intake to no more than 1 drink a day for nonpregnant women and 2 drinks a day for men. One drink equals 12 oz of beer, 5 oz of wine, or 1 oz of hard liquor.  Work with your health care provider to maintain a healthy body weight or to lose weight. Ask what an ideal weight is for you.  Get at least 30 minutes of exercise that causes your heart to beat faster (aerobic exercise) most days of the week. Activities may include walking, swimming, or biking.  Work with your health care provider or diet and nutrition specialist (dietitian) to adjust your eating plan to your individual calorie needs. Reading food labels  Check food labels for the amount of sodium per serving. Choose foods with less than 5 percent of the Daily Value of sodium. Generally, foods with less than 300 mg of sodium per serving fit into this eating plan.  To find whole grains, look for the word "whole" as the first word in the ingredient list. Shopping  Buy products labeled as "low-sodium" or "no salt added."  Buy fresh foods. Avoid canned foods and premade or frozen meals. Cooking  Avoid adding salt when cooking. Use salt-free seasonings or herbs instead of table salt or sea salt. Check with your health care provider or pharmacist before using salt substitutes.  Do not fry foods. Cook foods using healthy methods such as baking, boiling, grilling, and broiling instead.  Cook with  heart-healthy oils, such as olive, canola, soybean, or sunflower oil. Meal planning   Eat a balanced diet that includes: ? 5 or more servings of fruits and vegetables each day. At each meal, try to fill half of your plate with fruits and vegetables. ? Up to 6-8 servings of whole grains each day. ? Less than 6 oz of lean meat, poultry, or fish each day. A 3-oz serving of meat is about the same size as a deck of cards. One egg equals 1 oz. ? 2 servings of low-fat dairy each day. ? A serving of nuts, seeds, or beans 5 times each week. ? Heart-healthy fats. Healthy fats called Omega-3 fatty acids are found in foods such as flaxseeds and coldwater fish, like sardines, salmon, and mackerel.  Limit how much you eat of the following: ? Canned or prepackaged foods. ? Food that is high in trans fat, such as fried foods. ? Food that is high in saturated fat, such as fatty meat. ? Sweets, desserts, sugary drinks, and other foods with added sugar. ? Full-fat dairy products.  Do not salt foods before eating.  Try to eat at least 2 vegetarian meals each week.  Eat more home-cooked food and less restaurant, buffet, and fast food.  When eating at a restaurant, ask that your food be prepared with less salt or no salt, if possible. What foods are recommended? The items listed may not be a complete list. Talk with your dietitian about what   dietary choices are best for you. Grains Whole-grain or whole-wheat bread. Whole-grain or whole-wheat pasta. Brown rice. Oatmeal. Quinoa. Bulgur. Whole-grain and low-sodium cereals. Pita bread. Low-fat, low-sodium crackers. Whole-wheat flour tortillas. Vegetables Fresh or frozen vegetables (raw, steamed, roasted, or grilled). Low-sodium or reduced-sodium tomato and vegetable juice. Low-sodium or reduced-sodium tomato sauce and tomato paste. Low-sodium or reduced-sodium canned vegetables. Fruits All fresh, dried, or frozen fruit. Canned fruit in natural juice (without  added sugar). Meat and other protein foods Skinless chicken or turkey. Ground chicken or turkey. Pork with fat trimmed off. Fish and seafood. Egg whites. Dried beans, peas, or lentils. Unsalted nuts, nut butters, and seeds. Unsalted canned beans. Lean cuts of beef with fat trimmed off. Low-sodium, lean deli meat. Dairy Low-fat (1%) or fat-free (skim) milk. Fat-free, low-fat, or reduced-fat cheeses. Nonfat, low-sodium ricotta or cottage cheese. Low-fat or nonfat yogurt. Low-fat, low-sodium cheese. Fats and oils Soft margarine without trans fats. Vegetable oil. Low-fat, reduced-fat, or light mayonnaise and salad dressings (reduced-sodium). Canola, safflower, olive, soybean, and sunflower oils. Avocado. Seasoning and other foods Herbs. Spices. Seasoning mixes without salt. Unsalted popcorn and pretzels. Fat-free sweets. What foods are not recommended? The items listed may not be a complete list. Talk with your dietitian about what dietary choices are best for you. Grains Baked goods made with fat, such as croissants, muffins, or some breads. Dry pasta or rice meal packs. Vegetables Creamed or fried vegetables. Vegetables in a cheese sauce. Regular canned vegetables (not low-sodium or reduced-sodium). Regular canned tomato sauce and paste (not low-sodium or reduced-sodium). Regular tomato and vegetable juice (not low-sodium or reduced-sodium). Pickles. Olives. Fruits Canned fruit in a light or heavy syrup. Fried fruit. Fruit in cream or butter sauce. Meat and other protein foods Fatty cuts of meat. Ribs. Fried meat. Bacon. Sausage. Bologna and other processed lunch meats. Salami. Fatback. Hotdogs. Bratwurst. Salted nuts and seeds. Canned beans with added salt. Canned or smoked fish. Whole eggs or egg yolks. Chicken or turkey with skin. Dairy Whole or 2% milk, cream, and half-and-half. Whole or full-fat cream cheese. Whole-fat or sweetened yogurt. Full-fat cheese. Nondairy creamers. Whipped toppings.  Processed cheese and cheese spreads. Fats and oils Butter. Stick margarine. Lard. Shortening. Ghee. Bacon fat. Tropical oils, such as coconut, palm kernel, or palm oil. Seasoning and other foods Salted popcorn and pretzels. Onion salt, garlic salt, seasoned salt, table salt, and sea salt. Worcestershire sauce. Tartar sauce. Barbecue sauce. Teriyaki sauce. Soy sauce, including reduced-sodium. Steak sauce. Canned and packaged gravies. Fish sauce. Oyster sauce. Cocktail sauce. Horseradish that you find on the shelf. Ketchup. Mustard. Meat flavorings and tenderizers. Bouillon cubes. Hot sauce and Tabasco sauce. Premade or packaged marinades. Premade or packaged taco seasonings. Relishes. Regular salad dressings. Where to find more information:  National Heart, Lung, and Blood Institute: www.nhlbi.nih.gov  American Heart Association: www.heart.org Summary  The DASH eating plan is a healthy eating plan that has been shown to reduce high blood pressure (hypertension). It may also reduce your risk for type 2 diabetes, heart disease, and stroke.  With the DASH eating plan, you should limit salt (sodium) intake to 2,300 mg a day. If you have hypertension, you may need to reduce your sodium intake to 1,500 mg a day.  When on the DASH eating plan, aim to eat more fresh fruits and vegetables, whole grains, lean proteins, low-fat dairy, and heart-healthy fats.  Work with your health care provider or diet and nutrition specialist (dietitian) to adjust your eating plan to your individual   calorie needs. This information is not intended to replace advice given to you by your health care provider. Make sure you discuss any questions you have with your health care provider. Document Released: 06/26/2011 Document Revised: 06/30/2016 Document Reviewed: 06/30/2016 Elsevier Interactive Patient Education  2018 Elsevier Inc.  

## 2018-02-15 NOTE — Progress Notes (Signed)
George Watson is a 72 y.o. male with the following history as recorded in Epic[employer]:  Patient Active Problem List   Diagnosis Date Noted  . Fear of flying 02/11/2018  . Routine general medical examination at a health care facility 01/07/2017  . Hyperglycemia 01/06/2017  . Leukocytosis 01/06/2017  . Atherosclerosis of aorta ([place]) 07/09/2016  . Degeneration of lumbar or lumbosacral intervertebral disc 02/15/2014  . Ureteral calculus, right 02/02/2012  . Essential hypertension, benign   . H/O drug abuse   . Hyperlipidemia with target LDL less than 130   . HSV-2 (herpes simplex virus 2) infection   . COPD (chronic obstructive pulmonary disease) ([place])   . Benign prostatic hyperplasia   . DDD (degenerative disc disease), lumbosacral   . Prostate cancer ([place])   . Hepatitis C 10/19/1996    Current Outpatient Medications  Medication Sig Dispense Refill  . aspirin EC 81 MG tablet Take 1 tablet (81 mg total) by mouth daily. 90 tablet 3  . clotrimazole-betamethasone (LOTRISONE) cream     . co-enzyme Q-10 30 MG capsule Take 30 mg by mouth 3 (three) times daily.    . diazepam (VALIUM) 5 MG tablet Take 1 tablet (5 mg total) by mouth every 12 (twelve) hours as needed for anxiety. 2 tablet 1  . dutasteride (AVODART) 0.5 MG capsule Take 0.5 mg by mouth daily.    Marland Kitchen glucosamine-chondroitin 500-400 MG tablet Take 3 tablets by mouth daily.    . Influenza Vac Subunit Quad (FLUCELVAX QUADRIVALENT) 0.5 ML SUSY Flucelvax Quad 2018-2019 (PF) 60 mcg (15 mcg x 4)/0.5 mL IM syringe  TO BE ADMINISTERED BY PHARMACIST FOR IMMUNIZATION    . Multiple Vitamins-Minerals (CENTRUM SILVER PO) Take by mouth.    . mupirocin ointment (BACTROBAN) 2 % Applied to the irritated area twice a day 22 g 3  . Omega-3 Fatty Acids (FISH OIL) 1000 MG CAPS Take 1 capsule by mouth 3 (three) times daily.     . pimecrolimus (ELIDEL) 1 % cream Elidel 1 % topical cream    . sildenafil (VIAGRA) 100 MG tablet continuously as needed.    .  Tamsulosin HCl (FLOMAX) 0.4 MG CAPS Take 0.4 mg by mouth daily after breakfast.    . TRELEGY ELLIPTA 100-62.5-25 MCG/INH AEPB INHALE 1 PUFF INTO THE LUNGS DAILY 28 each 5  . olmesartan (BENICAR) 20 MG tablet Take 1 tablet (20 mg total) by mouth daily. 30 tablet 1   No current facility-administered medications for this visit.     Allergies: Patient has no known allergies.  Past Medical History:  Diagnosis Date  . BPH (benign prostatic hypertrophy)   . COPD (chronic obstructive pulmonary disease) ([place])   . DDD (degenerative disc disease), lumbosacral   . Elevated PSA   . Essential hypertension, benign    diet controlled  . Fracture of shaft of clavicle   . GERD (gastroesophageal reflux disease)   . H/O drug abuse    multisubstance  . Hepatitis C 10/1996  . HSV-2 (herpes simplex virus 2) infection   . Hx of biopsy 08/2004   liver  . Inguinal hernia    right  . Other and unspecified hyperlipidemia   . Prostate cancer ([place])   . Substance abuse ([place])   . Tuberculosis     Past Surgical History:  Procedure Laterality Date  . CERVICAL SPINE SURGERY    . fracture ribs     3  . HERNIA REPAIR    . INGUINAL HERNIA REPAIR  07/01/2012  Procedure: HERNIA REPAIR INGUINAL ADULT;  Surgeon: Madilyn Hook, DO;  Location: [facility];  Service: General;  Laterality: Right;  with Mesh  . left knee meniscus repair  1992  . right rotator cuff  1992  . right shoulder arthroscopy  2011    Family History  Problem Relation Age of Onset  . Cancer Sister        breast  . Leukemia Mother   . Cancer Father     Social History   Tobacco Use  . Smoking status: Former Smoker    Years: 43.00    Types: Cigarettes    Last attempt to quit: 07/21/2002    Years since quitting: 15.5  . Smokeless tobacco: Never Used  Substance Use Topics  . Alcohol use: No    Subjective:  Patient presents with concerns for elevated blood pressure; has had history of hypertension in the past- has taken medication in the  past few years but was able to stop; notes that son had a stroke in the past month/ friend had a stroke and decided that he should start checking his own blood pressure; denies any headaches/ blurred vision; patient notes that home readings average 170/90; planning to see his PCP in follow-up in about 2 weeks- prefers to do labs at that time.   Objective:  Vitals:   02/15/18 1032  BP: (!) 148/82  Pulse: 82  Temp: 99.1 F (37.3 C)  TempSrc: Oral  SpO2: 95%  Weight: 168 lb 12.8 oz (76.6 kg)  Height: 5\' 11"  (1.803 m)    General: Well developed, well nourished, in no acute distress  Skin : Warm and dry.  Head: Normocephalic and atraumatic  Lungs: Respirations unlabored; wheezing noted in all 4 lobes CVS exam: normal rate and regular rhythm.  Abdomen: Soft; nontender; nondistended; normoactive bowel sounds; no masses or hepatosplenomegaly  Neurologic: Alert and oriented; speech intact; face symmetrical; moves all extremities well; CNII-XII intact without focal deficit   Assessment:  1. Essential hypertension, benign     Plan:  Patient has spoke with previous provider and requesting a blood pressure medication "that works in the kidneys;" Update EKG today- NSR; he will get labs with his PCP follow-up in 2 weeks; trial of Benicar 20 mg daily; follow-up to be determined.  No follow-ups on file.  Orders Placed This Encounter  Procedures  . EKG 12-Lead    Requested Prescriptions   Signed Prescriptions Disp Refills  . glucosamine-chondroitin 500-400 MG tablet      Sig: Take 3 tablets by mouth daily.  Marland Kitchen olmesartan (BENICAR) 20 MG tablet 30 tablet 1    Sig: Take 1 tablet (20 mg total) by mouth daily.

## 2018-02-19 DIAGNOSIS — Z8619 Personal history of other infectious and parasitic diseases: Secondary | ICD-10-CM | POA: Diagnosis not present

## 2018-02-25 ENCOUNTER — Other Ambulatory Visit: Payer: Self-pay | Admitting: Internal Medicine

## 2018-02-25 DIAGNOSIS — J411 Mucopurulent chronic bronchitis: Secondary | ICD-10-CM

## 2018-03-04 ENCOUNTER — Ambulatory Visit: Payer: PRIVATE HEALTH INSURANCE | Admitting: Internal Medicine

## 2018-03-08 ENCOUNTER — Encounter: Payer: Self-pay | Admitting: Internal Medicine

## 2018-03-09 DIAGNOSIS — M47812 Spondylosis without myelopathy or radiculopathy, cervical region: Secondary | ICD-10-CM | POA: Diagnosis not present

## 2018-03-09 DIAGNOSIS — M9902 Segmental and somatic dysfunction of thoracic region: Secondary | ICD-10-CM | POA: Diagnosis not present

## 2018-03-09 DIAGNOSIS — M9903 Segmental and somatic dysfunction of lumbar region: Secondary | ICD-10-CM | POA: Diagnosis not present

## 2018-03-09 DIAGNOSIS — M9901 Segmental and somatic dysfunction of cervical region: Secondary | ICD-10-CM | POA: Diagnosis not present

## 2018-03-09 DIAGNOSIS — M47817 Spondylosis without myelopathy or radiculopathy, lumbosacral region: Secondary | ICD-10-CM | POA: Diagnosis not present

## 2018-03-17 ENCOUNTER — Encounter: Payer: Self-pay | Admitting: Internal Medicine

## 2018-03-17 ENCOUNTER — Ambulatory Visit (INDEPENDENT_AMBULATORY_CARE_PROVIDER_SITE_OTHER): Payer: PPO | Admitting: Internal Medicine

## 2018-03-17 ENCOUNTER — Other Ambulatory Visit (INDEPENDENT_AMBULATORY_CARE_PROVIDER_SITE_OTHER): Payer: PPO

## 2018-03-17 VITALS — BP 146/86 | HR 75 | Temp 97.6°F | Resp 16 | Wt 172.0 lb

## 2018-03-17 DIAGNOSIS — R739 Hyperglycemia, unspecified: Secondary | ICD-10-CM | POA: Diagnosis not present

## 2018-03-17 DIAGNOSIS — E785 Hyperlipidemia, unspecified: Secondary | ICD-10-CM

## 2018-03-17 DIAGNOSIS — J418 Mixed simple and mucopurulent chronic bronchitis: Secondary | ICD-10-CM

## 2018-03-17 DIAGNOSIS — I10 Essential (primary) hypertension: Secondary | ICD-10-CM

## 2018-03-17 LAB — CBC WITH DIFFERENTIAL/PLATELET
Basophils Absolute: 0.1 10*3/uL (ref 0.0–0.1)
Basophils Relative: 1.1 % (ref 0.0–3.0)
EOS PCT: 5.4 % — AB (ref 0.0–5.0)
Eosinophils Absolute: 0.5 10*3/uL (ref 0.0–0.7)
HCT: 48.3 % (ref 39.0–52.0)
Hemoglobin: 16.5 g/dL (ref 13.0–17.0)
LYMPHS ABS: 2.8 10*3/uL (ref 0.7–4.0)
Lymphocytes Relative: 30.7 % (ref 12.0–46.0)
MCHC: 34.1 g/dL (ref 30.0–36.0)
MCV: 91 fl (ref 78.0–100.0)
MONOS PCT: 12.5 % — AB (ref 3.0–12.0)
Monocytes Absolute: 1.2 10*3/uL — ABNORMAL HIGH (ref 0.1–1.0)
NEUTROS ABS: 4.6 10*3/uL (ref 1.4–7.7)
NEUTROS PCT: 50.3 % (ref 43.0–77.0)
Platelets: 243 10*3/uL (ref 150.0–400.0)
RBC: 5.31 Mil/uL (ref 4.22–5.81)
RDW: 13 % (ref 11.5–15.5)
WBC: 9.2 10*3/uL (ref 4.0–10.5)

## 2018-03-17 LAB — URINALYSIS, ROUTINE W REFLEX MICROSCOPIC
Bilirubin Urine: NEGATIVE
Hgb urine dipstick: NEGATIVE
Ketones, ur: NEGATIVE
Leukocytes, UA: NEGATIVE
Nitrite: NEGATIVE
RBC / HPF: NONE SEEN (ref 0–?)
SPECIFIC GRAVITY, URINE: 1.02 (ref 1.000–1.030)
Total Protein, Urine: NEGATIVE
Urine Glucose: NEGATIVE
Urobilinogen, UA: 0.2 (ref 0.0–1.0)
pH: 6.5 (ref 5.0–8.0)

## 2018-03-17 LAB — HEMOGLOBIN A1C: Hgb A1c MFr Bld: 6.2 % (ref 4.6–6.5)

## 2018-03-17 LAB — LDL CHOLESTEROL, DIRECT: LDL DIRECT: 132 mg/dL

## 2018-03-17 LAB — BASIC METABOLIC PANEL
BUN: 22 mg/dL (ref 6–23)
CALCIUM: 10.2 mg/dL (ref 8.4–10.5)
CO2: 26 meq/L (ref 19–32)
Chloride: 102 mEq/L (ref 96–112)
Creatinine, Ser: 1.05 mg/dL (ref 0.40–1.50)
GFR: 74.02 mL/min (ref >60.00)
GLUCOSE: 88 mg/dL (ref 70–99)
Potassium: 4.7 mEq/L (ref 3.5–5.1)
SODIUM: 137 meq/L (ref 135–145)

## 2018-03-17 LAB — LIPID PANEL
CHOLESTEROL: 221 mg/dL — AB (ref 0–200)
HDL: 30.9 mg/dL — AB (ref >39.00)
NonHDL: 190.09
Total CHOL/HDL Ratio: 7
Triglycerides: 381 mg/dL — ABNORMAL HIGH (ref 0.0–149.0)
VLDL: 76.2 mg/dL — AB (ref 0.0–40.0)

## 2018-03-17 LAB — TSH: TSH: 2.26 u[IU]/mL (ref 0.35–4.50)

## 2018-03-17 LAB — VITAMIN D 25 HYDROXY (VIT D DEFICIENCY, FRACTURES): VITD: 34.28 ng/mL (ref 30.00–100.00)

## 2018-03-17 MED ORDER — PITAVASTATIN CALCIUM 2 MG PO TABS: 1.0000 | ORAL_TABLET | Freq: Every day | ORAL | 1 refills | Status: DC

## 2018-03-17 MED ORDER — UMECLIDINIUM-VILANTEROL 62.5-25 MCG/INH IN AEPB: 1.0000 | INHALATION_SPRAY | Freq: Every day | RESPIRATORY_TRACT | 1 refills | Status: DC

## 2018-03-17 MED ORDER — OLMESARTAN MEDOXOMIL-HCTZ 40-12.5 MG PO TABS: 1.0000 | ORAL_TABLET | Freq: Every day | ORAL | 1 refills | Status: DC

## 2018-03-17 NOTE — Patient Instructions (Signed)

## 2018-03-17 NOTE — Progress Notes (Signed)
Subjective:  Patient ID: George Watson, male    DOB: Dec 03, 1945  Age: 72 y.o. MRN: [id number]  CC: Hypertension; Hyperlipidemia; and COPD   HPI George Watson presents for f/up on hypertension.  He was seen by someone else about a month ago and started on an ARB.  He has been monitoring his blood pressure frequently and has had no blood pressures less than 140/90.  He feels well and offers no complaints.  He tells me his insurance will not pay for Trelegy unless he fails Anoro first.  Outpatient Medications Prior to Visit  Medication Sig Dispense Refill  . dutasteride (AVODART) 0.5 MG capsule Take 0.5 mg by mouth daily.    Marland Kitchen glucosamine-chondroitin 500-400 MG tablet Take 3 tablets by mouth daily.    . Multiple Vitamins-Minerals (CENTRUM SILVER PO) Take by mouth.    . Omega-3 Fatty Acids (FISH OIL) 1000 MG CAPS Take 1 capsule by mouth 3 (three) times daily.     . pimecrolimus (ELIDEL) 1 % cream Elidel 1 % topical cream    . sildenafil (VIAGRA) 100 MG tablet continuously as needed.    Marland Kitchen aspirin EC 81 MG tablet Take 1 tablet (81 mg total) by mouth daily. 90 tablet 3  . clotrimazole-betamethasone (LOTRISONE) cream     . co-enzyme Q-10 30 MG capsule Take 30 mg by mouth 3 (three) times daily.    . diazepam (VALIUM) 5 MG tablet Take 1 tablet (5 mg total) by mouth every 12 (twelve) hours as needed for anxiety. 2 tablet 1  . Influenza Vac Subunit Quad (FLUCELVAX QUADRIVALENT) 0.5 ML SUSY Flucelvax Quad 2018-2019 (PF) 60 mcg (15 mcg x 4)/0.5 mL IM syringe  TO BE ADMINISTERED BY PHARMACIST FOR IMMUNIZATION    . mupirocin ointment (BACTROBAN) 2 % Applied to the irritated area twice a day 22 g 3  . olmesartan (BENICAR) 20 MG tablet Take 1 tablet (20 mg total) by mouth daily. 30 tablet 1  . Tamsulosin HCl (FLOMAX) 0.4 MG CAPS Take 0.4 mg by mouth daily after breakfast.    . TRELEGY ELLIPTA 100-62.5-25 MCG/INH AEPB INHALE 1 PUFF INTO THE LUNGS DAILY 90 each 1   No facility-administered medications  prior to visit.     ROS Review of Systems  Constitutional: Negative for diaphoresis and fatigue.  HENT: Negative.   Eyes: Negative for visual disturbance.  Respiratory: Negative for cough, chest tightness, shortness of breath and wheezing.   Cardiovascular: Negative for chest pain, palpitations and leg swelling.  Gastrointestinal: Negative for abdominal pain, constipation, diarrhea and nausea.  Endocrine: Negative.   Genitourinary: Negative.  Negative for difficulty urinating, dysuria and hematuria.  Musculoskeletal: Negative.  Negative for arthralgias and myalgias.  Skin: Negative.  Negative for color change.  Neurological: Negative.  Negative for dizziness, weakness, light-headedness and headaches.  Hematological: Negative for adenopathy. Does not bruise/bleed easily.  Psychiatric/Behavioral: Negative.     Objective:  BP (!) 146/86   Pulse 75   Temp 97.6 F (36.4 C) (Oral)   Resp 16   Wt 172 lb (78 kg)   SpO2 96%   BMI 23.99 kg/m   BP Readings from Last 3 Encounters:  03/17/18 (!) 146/86  02/15/18 (!) 148/82  09/30/17 (!) 150/83    Wt Readings from Last 3 Encounters:  03/17/18 172 lb (78 kg)  02/15/18 168 lb 12.8 oz (76.6 kg)  01/06/17 171 lb (77.6 kg)    Physical Exam  Constitutional: He is oriented to person, place, and time. No  distress.  HENT:  Mouth/Throat: Oropharynx is clear and moist. No oropharyngeal exudate.  Eyes: Conjunctivae are normal. No scleral icterus.  Neck: Normal range of motion. Neck supple. No JVD present. No thyromegaly present.  Cardiovascular: Normal rate, regular rhythm and normal heart sounds. Exam reveals no gallop.  No murmur heard. Pulmonary/Chest: Effort normal. No tachypnea. No respiratory distress. He has no decreased breath sounds. He has no wheezes. He has rhonchi in the right middle field and the right lower field. He has no rales.  Abdominal: Soft. Normal appearance and bowel sounds are normal. He exhibits no mass. There is no  hepatosplenomegaly. There is no tenderness.  Musculoskeletal: Normal range of motion. He exhibits no edema, tenderness or deformity.  Lymphadenopathy:    He has no cervical adenopathy.  Neurological: He is alert and oriented to person, place, and time.  Skin: Skin is warm and dry. No rash noted. He is not diaphoretic.  Psychiatric: He has a normal mood and affect. His behavior is normal. Judgment and thought content normal.  Vitals reviewed.   Lab Results  Component Value Date   WBC 9.2 03/17/2018   HGB 16.5 03/17/2018   HCT 48.3 03/17/2018   PLT 243.0 03/17/2018   GLUCOSE 88 03/17/2018   CHOL 221 (H) 03/17/2018   TRIG 381.0 (H) 03/17/2018   HDL 30.90 (L) 03/17/2018   LDLDIRECT 132.0 03/17/2018   LDLCALC 91 01/06/2017   ALT 22 01/06/2017   AST 19 01/06/2017   NA 137 03/17/2018   K 4.7 03/17/2018   CL 102 03/17/2018   CREATININE 1.05 03/17/2018   BUN 22 03/17/2018   CO2 26 03/17/2018   TSH 2.26 03/17/2018   PSA 1.00 01/05/2018   HGBA1C 6.2 03/17/2018   MICROALBUR 0.7 07/26/2015    US Abdomen Complete  Result Date: 11/20/2017 CLINICAL DATA:  Chronic hepatitis C EXAM: ABDOMEN ULTRASOUND COMPLETE COMPARISON:  Ultrasound the abdomen of 12/29/2016 FINDINGS: Gallbladder: The gallbladder is visualized and no gallstones are noted. There is no pain over the gallbladder with compression. Common bile duct: Diameter: The common bile duct is normal measuring 5.3 mm in diameter. Liver: The echogenicity of the liver is within normal limits. However the contours appear somewhat nodular and changes of cirrhosis cannot be excluded. No focal hepatic lesion is seen. Portal vein is patent on color Doppler imaging with normal direction of blood flow towards the liver. IVC: No abnormality visualized. Pancreas: The pancreas is moderately well seen with only the distal tail obscured. The pancreatic duct is not dilated. Spleen: The spleen measures 5.4 cm. Right Kidney: Length: 11.2 cm.  No hydronephrosis  is seen. Left Kidney: Length: 12.8 cm.  No hydronephrosis is seen. Abdominal aorta: The abdominal aorta is normal in caliber with abdominal aortic atherosclerosis noted. Other findings: None. IMPRESSION: 1. Somewhat nodular contours of the liver may indicate changes of cirrhosis. No focal hepatic abnormality is seen. 2. No gallstones. 3. Abdominal aortic atherosclerosis. Electronically Signed   By: Ivar Drape M.D.   On: 11/20/2017 12:18    Assessment & Plan:   George Watson was seen today for hypertension, hyperlipidemia and copd.  Diagnoses and all orders for this visit:  Essential hypertension, benign- His blood pressure is not adequately well controlled.  His lab work is negative for secondary causes or endorgan damage.  I will increase the dose of the ARB and will add on a thiazide diuretic as well. -     CBC with Differential/Platelet; Future -     TSH; Future -  Urinalysis, Routine w reflex microscopic; Future -     VITAMIN D 25 Hydroxy (Vit-D Deficiency, Fractures); Future -     olmesartan-hydrochlorothiazide (BENICAR HCT) 40-12.5 MG tablet; Take 1 tablet by mouth daily.  Hyperglycemia- His A1c is at 6.2%.  He is prediabetic.  Medical therapy is not indicated. -     Basic metabolic panel; Future -     Hemoglobin A1c; Future  Hyperlipidemia with target LDL less than 130- He has an elevated ASCVD risk score so I have asked him to take a statin for CV risk reduction. -     Lipid panel; Future -     Pitavastatin Calcium (LIVALO) 2 MG TABS; Take 1 tablet (2 mg total) by mouth daily.  Mixed simple and mucopurulent chronic bronchitis (HCC) -     umeclidinium-vilanterol (ANORO ELLIPTA) 62.5-25 MCG/INH AEPB; Inhale 1 puff into the lungs daily.   I have discontinued George Watson's tamsulosin, mupirocin ointment, clotrimazole-betamethasone, aspirin EC, diazepam, Influenza Vac Subunit Quad, co-enzyme Q-10, olmesartan, and TRELEGY ELLIPTA. I am also having him start on  olmesartan-hydrochlorothiazide, umeclidinium-vilanterol, and Pitavastatin Calcium. Additionally, I am having him maintain his Fish Oil, dutasteride, Multiple Vitamins-Minerals (CENTRUM SILVER PO), pimecrolimus, sildenafil, and glucosamine-chondroitin.  Meds ordered this encounter  Medications  . olmesartan-hydrochlorothiazide (BENICAR HCT) 40-12.5 MG tablet    Sig: Take 1 tablet by mouth daily.    Dispense:  90 tablet    Refill:  1  . umeclidinium-vilanterol (ANORO ELLIPTA) 62.5-25 MCG/INH AEPB    Sig: Inhale 1 puff into the lungs daily.    Dispense:  90 each    Refill:  1  . Pitavastatin Calcium (LIVALO) 2 MG TABS    Sig: Take 1 tablet (2 mg total) by mouth daily.    Dispense:  90 tablet    Refill:  1     Follow-up: Return in about 3 months (around 06/17/2018).  Scarlette Calico, MD

## 2018-03-29 ENCOUNTER — Encounter: Payer: Self-pay | Admitting: Internal Medicine

## 2018-04-03 ENCOUNTER — Encounter: Payer: Self-pay | Admitting: Internal Medicine

## 2018-04-05 DIAGNOSIS — D692 Other nonthrombocytopenic purpura: Secondary | ICD-10-CM | POA: Diagnosis not present

## 2018-04-05 DIAGNOSIS — L821 Other seborrheic keratosis: Secondary | ICD-10-CM | POA: Diagnosis not present

## 2018-04-05 DIAGNOSIS — L400 Psoriasis vulgaris: Secondary | ICD-10-CM | POA: Diagnosis not present

## 2018-04-06 ENCOUNTER — Ambulatory Visit (HOSPITAL_COMMUNITY)
Admission: EM | Admit: 2018-04-06 | Discharge: 2018-04-06 | Disposition: A | Discharge status: Home / Self Care | Payer: PPO | Attending: Family Medicine | Admitting: Family Medicine

## 2018-04-06 ENCOUNTER — Encounter (HOSPITAL_COMMUNITY): Payer: Self-pay

## 2018-04-06 DIAGNOSIS — Z87891 Personal history of nicotine dependence: Secondary | ICD-10-CM | POA: Diagnosis not present

## 2018-04-06 DIAGNOSIS — Z79899 Other long term (current) drug therapy: Secondary | ICD-10-CM | POA: Insufficient documentation

## 2018-04-06 DIAGNOSIS — M9903 Segmental and somatic dysfunction of lumbar region: Secondary | ICD-10-CM | POA: Diagnosis not present

## 2018-04-06 DIAGNOSIS — I10 Essential (primary) hypertension: Secondary | ICD-10-CM | POA: Diagnosis not present

## 2018-04-06 DIAGNOSIS — E785 Hyperlipidemia, unspecified: Secondary | ICD-10-CM | POA: Diagnosis not present

## 2018-04-06 DIAGNOSIS — M9902 Segmental and somatic dysfunction of thoracic region: Secondary | ICD-10-CM | POA: Diagnosis not present

## 2018-04-06 DIAGNOSIS — Z8546 Personal history of malignant neoplasm of prostate: Secondary | ICD-10-CM | POA: Insufficient documentation

## 2018-04-06 DIAGNOSIS — B1920 Unspecified viral hepatitis C without hepatic coma: Secondary | ICD-10-CM | POA: Insufficient documentation

## 2018-04-06 DIAGNOSIS — N400 Enlarged prostate without lower urinary tract symptoms: Secondary | ICD-10-CM | POA: Diagnosis not present

## 2018-04-06 DIAGNOSIS — M47812 Spondylosis without myelopathy or radiculopathy, cervical region: Secondary | ICD-10-CM | POA: Diagnosis not present

## 2018-04-06 DIAGNOSIS — J029 Acute pharyngitis, unspecified: Secondary | ICD-10-CM

## 2018-04-06 DIAGNOSIS — M47817 Spondylosis without myelopathy or radiculopathy, lumbosacral region: Secondary | ICD-10-CM | POA: Diagnosis not present

## 2018-04-06 DIAGNOSIS — J449 Chronic obstructive pulmonary disease, unspecified: Secondary | ICD-10-CM | POA: Insufficient documentation

## 2018-04-06 DIAGNOSIS — M9901 Segmental and somatic dysfunction of cervical region: Secondary | ICD-10-CM | POA: Diagnosis not present

## 2018-04-06 LAB — POCT RAPID STREP A: Streptococcus, Group A Screen (Direct): NEGATIVE

## 2018-04-06 MED ORDER — CETIRIZINE HCL 10 MG PO CAPS: 10.0000 mg | ORAL_CAPSULE | Freq: Every day | ORAL | 0 refills | Status: DC

## 2018-04-06 NOTE — ED Triage Notes (Signed)
Pt presents with complaints of sore throat, denies any fevers or other symptoms.

## 2018-04-06 NOTE — ED Provider Notes (Signed)
[facility]    CSN: [id number] Arrival date & time: 04/06/18  1540     History   Chief Complaint Chief Complaint  Patient presents with  . Sore Throat    HPI George Watson is a 72 y.o. male history of COPD, hyperlipidemia, hypertension presenting today for evaluation of sore throat.  Patient states that his sore throat began yesterday and has progressively worsened since.  He ate some potato chips which she feels worsened his symptoms.  He has been trying ibuprofen, Zicam and Cepacol lozenges but is concerned that his throat is still sore.  Denies other associated symptoms including congestion, rhinorrhea, cough.  Patient has had wheezing, but states that he chronically has wheeze denies exacerbation of his COPD at this time.  Denies fevers.  HPI  Past Medical History:  Diagnosis Date  . BPH (benign prostatic hypertrophy)   . COPD (chronic obstructive pulmonary disease) ([place])   . DDD (degenerative disc disease), lumbosacral   . Elevated PSA   . Essential hypertension, benign    diet controlled  . Fracture of shaft of clavicle   . GERD (gastroesophageal reflux disease)   . H/O drug abuse    multisubstance  . Hepatitis C 10/1996  . HSV-2 (herpes simplex virus 2) infection   . Hx of biopsy 08/2004   liver  . Inguinal hernia    right  . Other and unspecified hyperlipidemia   . Prostate cancer ([place])   . Substance abuse ([place])   . Tuberculosis     Patient Active Problem List   Diagnosis Date Noted  . Fear of flying 02/11/2018  . Routine general medical examination at a health care facility 01/07/2017  . Hyperglycemia 01/06/2017  . Atherosclerosis of aorta ([place]) 07/09/2016  . Degeneration of lumbar or lumbosacral intervertebral disc 02/15/2014  . Ureteral calculus, right 02/02/2012  . Essential hypertension, benign   . Hyperlipidemia with target LDL less than 130   . HSV-2 (herpes simplex virus 2) infection   . COPD (chronic obstructive pulmonary disease)  ([place])   . Benign prostatic hyperplasia   . DDD (degenerative disc disease), lumbosacral   . Prostate cancer ([place])   . Hepatitis C 10/19/1996    Past Surgical History:  Procedure Laterality Date  . CERVICAL SPINE SURGERY    . fracture ribs     3  . HERNIA REPAIR    . INGUINAL HERNIA REPAIR  07/01/2012   Procedure: HERNIA REPAIR INGUINAL ADULT;  Surgeon: Madilyn Hook, DO;  Location: [facility];  Service: General;  Laterality: Right;  with Mesh  . left knee meniscus repair  1992  . right rotator cuff  1992  . right shoulder arthroscopy  2011       Home Medications    Prior to Admission medications   Medication Sig Start Date End Date Taking? Authorizing Provider  Cetirizine HCl 10 MG CAPS Take 1 capsule (10 mg total) by mouth daily for 10 days. 04/06/18 04/16/18  Sajid Ruppert C, PA-C  dutasteride (AVODART) 0.5 MG capsule Take 0.5 mg by mouth daily.    [provider]  glucosamine-chondroitin 500-400 MG tablet Take 3 tablets by mouth daily. 02/15/18   Marrian Salvage, FNP  Multiple Vitamins-Minerals (CENTRUM SILVER PO) Take by mouth.    [provider]  olmesartan-hydrochlorothiazide (BENICAR HCT) 40-12.5 MG tablet Take 1 tablet by mouth daily. 03/17/18   Janith Lima, MD  Omega-3 Fatty Acids (FISH OIL) 1000 MG CAPS Take 1 capsule by mouth  3 (three) times daily.     [provider]  pimecrolimus (ELIDEL) 1 % cream Elidel 1 % topical cream    [provider]  Pitavastatin Calcium (LIVALO) 2 MG TABS Take 1 tablet (2 mg total) by mouth daily. 03/17/18   Janith Lima, MD  sildenafil (VIAGRA) 100 MG tablet continuously as needed. 12/09/12   [provider]  umeclidinium-vilanterol (ANORO ELLIPTA) 62.5-25 MCG/INH AEPB Inhale 1 puff into the lungs daily. 03/17/18   Janith Lima, MD    Family History Family History  Problem Relation Age of Onset  . Cancer Sister        breast  . Leukemia Mother   . Cancer Father     Social  History Social History   Tobacco Use  . Smoking status: Former Smoker    Years: 43.00    Types: Cigarettes    Last attempt to quit: 07/21/2002    Years since quitting: 15.7  . Smokeless tobacco: Never Used  Substance Use Topics  . Alcohol use: No  . Drug use: No    Comment: 26 years ago cocaine, heroin. methadone     Allergies   Patient has no known allergies.   Review of Systems Review of Systems  Constitutional: Negative for activity change, appetite change, chills, fatigue and fever.  HENT: Positive for sore throat. Negative for congestion, ear pain, rhinorrhea, sinus pressure and trouble swallowing.   Eyes: Negative for discharge and redness.  Respiratory: Negative for cough, chest tightness and shortness of breath.   Cardiovascular: Negative for chest pain.  Gastrointestinal: Negative for abdominal pain, diarrhea, nausea and vomiting.  Musculoskeletal: Negative for myalgias.  Skin: Negative for rash.  Neurological: Negative for dizziness, light-headedness and headaches.     Physical Exam Triage Vital Signs ED Triage Vitals  Enc Vitals Group     BP 04/06/18 0843 (!) 145/77     Pulse Rate 04/06/18 0843 62     Resp 04/06/18 0842 20     Temp 04/06/18 0842 98.3 F (36.8 C)     Temp src --      SpO2 04/06/18 0842 97 %     Weight --      Height --      Head Circumference --      Peak Flow --      Pain Score 04/06/18 0843 2     Pain Loc --      Pain Edu? --      Excl. in [place]? --    No data found.  Updated Vital Signs BP (!) 145/77   Pulse 62   Temp 98.3 F (36.8 C)   Resp 20   SpO2 97%   Visual Acuity Right Eye Distance:   Left Eye Distance:   Bilateral Distance:    Right Eye Near:   Left Eye Near:    Bilateral Near:     Physical Exam  Constitutional: He appears well-developed and well-nourished.  HENT:  Head: Normocephalic and atraumatic.  Bilateral ears without tenderness to palpation of external auricle, tragus and mastoid, EAC's without  erythema or swelling, TM's with good bony landmarks and cone of light. Non erythematous.  Oral mucosa pink and moist, no tonsillar enlargement or exudate. Posterior pharynx patent and erythematous, no uvula deviation or swelling. Normal phonation.  Eyes: Conjunctivae are normal.  Neck: Neck supple.  Cardiovascular: Normal rate and regular rhythm.  No murmur heard. Pulmonary/Chest: Effort normal and breath sounds normal. No respiratory distress.  Breathing  comfortably at rest, CTABL, no wheezing, rales or other adventitious sounds auscultated  Abdominal: Soft. There is no tenderness.  Musculoskeletal: He exhibits no edema.  Neurological: He is alert.  Skin: Skin is warm and dry.  Psychiatric: He has a normal mood and affect.  Nursing note and vitals reviewed.    UC Treatments / Results  Labs (all labs ordered are listed, but only abnormal results are displayed) Labs Reviewed  CULTURE, GROUP A STREP [facility])  POCT RAPID STREP A    EKG None  Radiology No results found.  Procedures Procedures (including critical care time)  Medications Ordered in UC Medications - No data to display  Initial Impression / Assessment and Plan / UC Course  I have reviewed the triage vital signs and the nursing notes.  Pertinent labs & imaging results that were available during my care of the patient were reviewed by me and considered in my medical decision making (see chart for details).     Patient with sore throat for 2 days, strep test negative, no associated symptoms.  Possible drainage versus viral.  Will treat symptomatically with daily allergy pill, other recommendations below.Discussed strict return precautions. Patient verbalized understanding and is agreeable with plan.  Final Clinical Impressions(s) / UC Diagnoses   Final diagnoses:  Sore throat     Discharge Instructions     Sore Throat  Your rapid strep tested Negative today. We will send for a culture and call in about 2  days if results are positive. For now we will treat your sore throat as a virus with symptom management.   Please continue Tylenol or Ibuprofen for fever and pain. May try salt water gargles, cepacol lozenges, throat spray, or OTC cold relief medicine for throat discomfort. If you also have congestion take a daily anti-histamine like Zyrtec, Claritin, and a oral decongestant to help with post nasal drip that may be irritating your throat.   Stay hydrated and drink plenty of fluids to keep your throat coated relieve irritation.     ED Prescriptions    Medication Sig Dispense Auth. Provider   Cetirizine HCl 10 MG CAPS Take 1 capsule (10 mg total) by mouth daily for 10 days. 15 capsule Leanndra Pember C, PA-C     Controlled Substance Prescriptions [place] Controlled Substance Registry consulted? Not Applicable   Janith Lima, [place] 04/06/18 1712

## 2018-04-06 NOTE — Discharge Instructions (Addendum)
Sore Throat  °Your rapid strep tested Negative today. We will send for a culture and call in about 2 days if results are positive. For now we will treat your sore throat as a virus with symptom management.  ° °Please continue Tylenol or Ibuprofen for fever and pain. May try salt water gargles, cepacol lozenges, throat spray, or OTC cold relief medicine for throat discomfort. If you also have congestion take a daily anti-histamine like Zyrtec, Claritin, and a oral decongestant to help with post nasal drip that may be irritating your throat.  ° °Stay hydrated and drink plenty of fluids to keep your throat coated relieve irritation.  ° °

## 2018-04-08 LAB — CULTURE, GROUP A STREP (THRC)

## 2018-05-04 DIAGNOSIS — M47812 Spondylosis without myelopathy or radiculopathy, cervical region: Secondary | ICD-10-CM | POA: Diagnosis not present

## 2018-05-04 DIAGNOSIS — M47817 Spondylosis without myelopathy or radiculopathy, lumbosacral region: Secondary | ICD-10-CM | POA: Diagnosis not present

## 2018-05-04 DIAGNOSIS — M9902 Segmental and somatic dysfunction of thoracic region: Secondary | ICD-10-CM | POA: Diagnosis not present

## 2018-05-04 DIAGNOSIS — M9901 Segmental and somatic dysfunction of cervical region: Secondary | ICD-10-CM | POA: Diagnosis not present

## 2018-05-04 DIAGNOSIS — M9903 Segmental and somatic dysfunction of lumbar region: Secondary | ICD-10-CM | POA: Diagnosis not present

## 2018-05-20 ENCOUNTER — Ambulatory Visit: Payer: PPO

## 2018-05-20 NOTE — Progress Notes (Deleted)
Subjective:   George Watson is a 72 y.o. male who presents for an Initial Medicare Annual Wellness Visit.  Review of Systems  No ROS.  Medicare Wellness Visit. Additional risk factors are reflected in the social history.    Sleep patterns: {SX; SLEEP PATTERNS:18802::"feels rested on waking","does not get up to void","gets up *** times nightly to void","sleeps *** hours nightly"}.    Home Safety/Smoke Alarms: Feels safe in home. Smoke alarms in place.  Living environment; residence and Firearm Safety: {Rehab home environment / accessibility:30080::"no firearms","firearms stored safely"}. Seat Belt Safety/Bike Helmet: Wears seat belt.   PSA-  Lab Results  Component Value Date   PSA 1.00 01/05/2018   PSA 1.00 01/04/2018   PSA 1.68 01/06/2017      Objective:    There were no vitals filed for this visit. There is no height or weight on file to calculate BMI.  Advanced Directives 04/02/2017 06/29/2012 02/02/2012  Does Patient Have a Medical Advance Directive? No;Yes Patient has advance directive, copy not in chart Patient does not have advance directive  Type of Advance Directive - Living will -  Copy of [place] in Chart? - Copy requested from family -  Pre-existing out of facility DNR order (yellow form or pink MOST form) - No -    Current Medications (verified) Outpatient Encounter Medications as of 05/20/2018  Medication Sig  . Cetirizine HCl 10 MG CAPS Take 1 capsule (10 mg total) by mouth daily for 10 days.  Marland Kitchen dutasteride (AVODART) 0.5 MG capsule Take 0.5 mg by mouth daily.  Marland Kitchen glucosamine-chondroitin 500-400 MG tablet Take 3 tablets by mouth daily.  . Multiple Vitamins-Minerals (CENTRUM SILVER PO) Take by mouth.  . olmesartan-hydrochlorothiazide (BENICAR HCT) 40-12.5 MG tablet Take 1 tablet by mouth daily.  . Omega-3 Fatty Acids (FISH OIL) 1000 MG CAPS Take 1 capsule by mouth 3 (three) times daily.   . pimecrolimus (ELIDEL) 1 % cream Elidel 1 %  topical cream  . Pitavastatin Calcium (LIVALO) 2 MG TABS Take 1 tablet (2 mg total) by mouth daily.  . sildenafil (VIAGRA) 100 MG tablet continuously as needed.  . umeclidinium-vilanterol (ANORO ELLIPTA) 62.5-25 MCG/INH AEPB Inhale 1 puff into the lungs daily.   No facility-administered encounter medications on file as of 05/20/2018.     Allergies (verified) Patient has no known allergies.   History: Past Medical History:  Diagnosis Date  . BPH (benign prostatic hypertrophy)   . COPD (chronic obstructive pulmonary disease) ([place])   . DDD (degenerative disc disease), lumbosacral   . Elevated PSA   . Essential hypertension, benign    diet controlled  . Fracture of shaft of clavicle   . GERD (gastroesophageal reflux disease)   . H/O drug abuse    multisubstance  . Hepatitis C 10/1996  . HSV-2 (herpes simplex virus 2) infection   . Hx of biopsy 08/2004   liver  . Inguinal hernia    right  . Other and unspecified hyperlipidemia   . Prostate cancer ([place])   . Substance abuse ([place])   . Tuberculosis    Past Surgical History:  Procedure Laterality Date  . CERVICAL SPINE SURGERY    . fracture ribs     3  . HERNIA REPAIR    . INGUINAL HERNIA REPAIR  07/01/2012   Procedure: HERNIA REPAIR INGUINAL ADULT;  Surgeon: Madilyn Hook, DO;  Location: [facility];  Service: General;  Laterality: Right;  with Mesh  . left knee meniscus repair  1992  . right rotator cuff  1992  . right shoulder arthroscopy  2011   Family History  Problem Relation Age of Onset  . Cancer Sister        breast  . Leukemia Mother   . Cancer Father    Social History   Socioeconomic History  . Marital status: Married    Spouse name: Not on file  . Number of children: Not on file  . Years of education: Not on file  . Highest education level: Not on file  Occupational History  . Occupation: Diplomatic Services operational officer  . Financial resource strain: Not on file  . Food insecurity:    Worry: Not on file     Inability: Not on file  . Transportation needs:    Medical: Not on file    Non-medical: Not on file  Tobacco Use  . Smoking status: Former Smoker    Years: 43.00    Types: Cigarettes    Last attempt to quit: 07/21/2002    Years since quitting: 15.8  . Smokeless tobacco: Never Used  Substance and Sexual Activity  . Alcohol use: No  . Drug use: No    Comment: 26 years ago cocaine, heroin. methadone  . Sexual activity: Yes    Comment: number of sex partners in the last 12 months  1  Lifestyle  . Physical activity:    Days per week: Not on file    Minutes per session: Not on file  . Stress: Not on file  Relationships  . Social connections:    Talks on phone: Not on file    Gets together: Not on file    Attends religious service: Not on file    Active member of club or organization: Not on file    Attends meetings of clubs or organizations: Not on file    Relationship status: Not on file  Other Topics Concern  . Not on file  Social History Narrative      Married to [employer]. Exercise cardio daily for 30 minutes. Education: [employer].   Tobacco Counseling Counseling given: Not Answered  Activities of Daily Living No flowsheet data found.   Immunizations and Health Maintenance Immunization History  Administered Date(s) Administered  . Hepatitis A 02/18/1998, 08/21/1998  . Influenza Split 04/01/2011, 05/18/2012  . Influenza, High Dose Seasonal PF 04/23/2016, 03/17/2018  . Influenza, Seasonal, Injecte, Preservative Fre 05/01/2017  . Influenza,inj,Quad PF,6+ Mos 04/28/2013, 04/21/2014  . Influenza-Unspecified 04/21/2015  . Pneumococcal Conjugate-13 08/22/2014  . Pneumococcal Polysaccharide-23 07/21/2002, 07/08/2016  . Tdap 11/18/2005, 01/03/2015   Health Maintenance Due  Topic Date Due  . OPHTHALMOLOGY EXAM  07/21/2016  . FOOT EXAM  01/23/2017    Patient Care Team: Janith Lima, MD as PCP - General (Internal Medicine) Verl Blalock, Marijo Conception, MD (Inactive)  (Cardiology) Richmond Campbell, MD (Gastroenterology) Newt Minion, MD (Orthopedic Surgery) Kathie Rhodes, MD (Urology)  Indicate any recent Medical Services you may have received from other than [facility] providers in the past year (date may be approximate).    Assessment:   This is a routine wellness examination for [facility]. Physical assessment deferred to PCP.   Hearing/Vision screen No exam data present  Dietary issues and exercise activities discussed:   Diet (meal preparation, eat out, water intake, caffeinated beverages, dairy products, fruits and vegetables): {Desc; diets:16563}   Goals   None    Depression Screen PHQ 2/9 Scores 01/24/2016 07/26/2015 07/09/2015 05/25/2015  PHQ - 2 Score 0 0  0 0    Fall Risk Fall Risk  02/15/2018 01/24/2016 07/26/2015 01/23/2015 08/22/2014  Falls in the past year? No No No No No   Cognitive Function:        Screening Tests Health Maintenance  Topic Date Due  . OPHTHALMOLOGY EXAM  07/21/2016  . FOOT EXAM  01/23/2017  . HEMOGLOBIN A1C  09/17/2018  . TETANUS/TDAP  01/02/2025  . COLONOSCOPY  08/25/2027  . INFLUENZA VACCINE  Completed  . Hepatitis C Screening  Completed  . PNA vac Low Risk Adult  Completed       Plan:     I have personally reviewed and noted the following in the patient's chart:   . Medical and social history . Use of alcohol, tobacco or illicit drugs  . Current medications and supplements . Functional ability and status . Nutritional status . Physical activity . Advanced directives . List of other physicians . Vitals . Screenings to include cognitive, depression, and falls . Referrals and appointments  In addition, I have reviewed and discussed with patient certain preventive protocols, quality metrics, and best practice recommendations. A written personalized care plan for preventive services as well as general preventive health recommendations were provided to patient.     Michiel Cowboy, RN   05/20/2018

## 2018-06-01 DIAGNOSIS — M47812 Spondylosis without myelopathy or radiculopathy, cervical region: Secondary | ICD-10-CM | POA: Diagnosis not present

## 2018-06-01 DIAGNOSIS — M9903 Segmental and somatic dysfunction of lumbar region: Secondary | ICD-10-CM | POA: Diagnosis not present

## 2018-06-01 DIAGNOSIS — M9902 Segmental and somatic dysfunction of thoracic region: Secondary | ICD-10-CM | POA: Diagnosis not present

## 2018-06-01 DIAGNOSIS — M9901 Segmental and somatic dysfunction of cervical region: Secondary | ICD-10-CM | POA: Diagnosis not present

## 2018-06-01 DIAGNOSIS — M47817 Spondylosis without myelopathy or radiculopathy, lumbosacral region: Secondary | ICD-10-CM | POA: Diagnosis not present

## 2018-06-01 DIAGNOSIS — S29012A Strain of muscle and tendon of back wall of thorax, initial encounter: Secondary | ICD-10-CM | POA: Diagnosis not present

## 2018-06-09 ENCOUNTER — Ambulatory Visit (INDEPENDENT_AMBULATORY_CARE_PROVIDER_SITE_OTHER): Payer: PPO | Admitting: Internal Medicine

## 2018-06-09 ENCOUNTER — Other Ambulatory Visit: Payer: Self-pay | Admitting: Internal Medicine

## 2018-06-09 ENCOUNTER — Encounter: Payer: Self-pay | Admitting: Internal Medicine

## 2018-06-09 VITALS — BP 164/92 | HR 74 | Temp 97.9°F | Resp 16 | Ht 71.0 in | Wt 173.2 lb

## 2018-06-09 DIAGNOSIS — I10 Essential (primary) hypertension: Secondary | ICD-10-CM | POA: Diagnosis not present

## 2018-06-09 DIAGNOSIS — Z0000 Encounter for general adult medical examination without abnormal findings: Secondary | ICD-10-CM

## 2018-06-09 DIAGNOSIS — E785 Hyperlipidemia, unspecified: Secondary | ICD-10-CM | POA: Diagnosis not present

## 2018-06-09 DIAGNOSIS — J418 Mixed simple and mucopurulent chronic bronchitis: Secondary | ICD-10-CM

## 2018-06-09 DIAGNOSIS — R739 Hyperglycemia, unspecified: Secondary | ICD-10-CM

## 2018-06-09 DIAGNOSIS — Z0001 Encounter for general adult medical examination with abnormal findings: Secondary | ICD-10-CM | POA: Diagnosis not present

## 2018-06-09 MED ORDER — UMECLIDINIUM-VILANTEROL 62.5-25 MCG/INH IN AEPB: 1.0000 | INHALATION_SPRAY | Freq: Every day | RESPIRATORY_TRACT | 1 refills | Status: DC

## 2018-06-09 MED ORDER — OLMESARTAN MEDOXOMIL 20 MG PO TABS: 20.0000 mg | ORAL_TABLET | Freq: Every day | ORAL | 1 refills | Status: DC

## 2018-06-09 NOTE — Patient Instructions (Signed)

## 2018-06-09 NOTE — Progress Notes (Signed)
Subjective:  Patient ID: George Watson, male    DOB: 07/27/1945  Age: 72 y.o. MRN: [id number]  CC: Hypertension; Hyperlipidemia; COPD; and Annual Exam   HPI George Watson presents for a CPX.  He complains that Livalo because muscle and joint aches so he stopped taking it.  He tells me the muscle and joint aches have resolved.   He is also concerned his blood pressure is not well controlled.  He is not taking the ARB/thiazide combination because it caused dizziness.  He is very active and denies any recent episodes of CP, DOE, palpitations, edema, or fatigue.  Past Medical History:  Diagnosis Date  . BPH (benign prostatic hypertrophy)   . COPD (chronic obstructive pulmonary disease) ([place])   . DDD (degenerative disc disease), lumbosacral   . Elevated PSA   . Essential hypertension, benign    diet controlled  . Fracture of shaft of clavicle   . GERD (gastroesophageal reflux disease)   . H/O drug abuse ([place])    multisubstance  . Hepatitis C 10/1996  . HSV-2 (herpes simplex virus 2) infection   . Hx of biopsy 08/2004   liver  . Inguinal hernia    right  . Other and unspecified hyperlipidemia   . Prostate cancer ([place])   . Substance abuse ([place])   . Tuberculosis    Past Surgical History:  Procedure Laterality Date  . CERVICAL SPINE SURGERY    . fracture ribs     3  . HERNIA REPAIR    . INGUINAL HERNIA REPAIR  07/01/2012   Procedure: HERNIA REPAIR INGUINAL ADULT;  Surgeon: Madilyn Hook, DO;  Location: [facility];  Service: General;  Laterality: Right;  with Mesh  . left knee meniscus repair  1992  . right rotator cuff  1992  . right shoulder arthroscopy  2011    reports that he quit smoking about 15 years ago. His smoking use included cigarettes. He quit after 43.00 years of use. He has never used smokeless tobacco. He reports that he does not drink alcohol or use drugs. family history includes Cancer in his father and sister; Leukemia in his mother. No Known  Allergies  Outpatient Medications Prior to Visit  Medication Sig Dispense Refill  . dutasteride (AVODART) 0.5 MG capsule Take 0.5 mg by mouth daily.    Marland Kitchen glucosamine-chondroitin 500-400 MG tablet Take 3 tablets by mouth daily.    . Multiple Vitamins-Minerals (CENTRUM SILVER PO) Take by mouth.    . Omega-3 Fatty Acids (FISH OIL) 1000 MG CAPS Take 1 capsule by mouth 3 (three) times daily.     . sildenafil (VIAGRA) 100 MG tablet continuously as needed.    . tamsulosin (FLOMAX) 0.4 MG CAPS capsule tamsulosin 0.4 mg capsule    . umeclidinium-vilanterol (ANORO ELLIPTA) 62.5-25 MCG/INH AEPB Inhale 1 puff into the lungs daily. 90 each 1  . Cetirizine HCl 10 MG CAPS Take 1 capsule (10 mg total) by mouth daily for 10 days. 15 capsule 0  . olmesartan-hydrochlorothiazide (BENICAR HCT) 40-12.5 MG tablet Take 1 tablet by mouth daily. 90 tablet 1  . pimecrolimus (ELIDEL) 1 % cream Elidel 1 % topical cream    . Pitavastatin Calcium (LIVALO) 2 MG TABS Take 1 tablet (2 mg total) by mouth daily. 90 tablet 1   No facility-administered medications prior to visit.     ROS Review of Systems  Constitutional: Negative for appetite change, diaphoresis, fatigue and unexpected weight change.  HENT: Negative.   Eyes: Negative  for visual disturbance.  Respiratory: Negative for cough, chest tightness, shortness of breath and wheezing.   Cardiovascular: Negative for chest pain, palpitations and leg swelling.  Gastrointestinal: Negative for abdominal pain, constipation, diarrhea and nausea.  Genitourinary: Negative.  Negative for difficulty urinating, scrotal swelling, testicular pain and urgency.  Musculoskeletal: Negative.  Negative for arthralgias and myalgias.  Skin: Negative.  Negative for color change.  Neurological: Negative for dizziness, weakness and light-headedness.  Hematological: Negative for adenopathy. Does not bruise/bleed easily.  Psychiatric/Behavioral: Negative.     Objective:  BP (!) 164/92  (BP Location: Left Arm, Patient Position: Sitting, Cuff Size: Normal)   Pulse 74   Temp 97.9 F (36.6 C) (Oral)   Resp 16   Ht 5\' 11"  (1.803 m)   Wt 173 lb 4 oz (78.6 kg)   SpO2 95%   BMI 24.16 kg/m   BP Readings from Last 3 Encounters:  06/09/18 (!) 164/92  04/06/18 (!) 145/77  03/17/18 (!) 146/86    Wt Readings from Last 3 Encounters:  06/09/18 173 lb 4 oz (78.6 kg)  03/17/18 172 lb (78 kg)  02/15/18 168 lb 12.8 oz (76.6 kg)    Physical Exam  Constitutional: He is oriented to person, place, and time.  HENT:  Mouth/Throat: Oropharynx is clear and moist. No oropharyngeal exudate.  Eyes: Conjunctivae are normal. No scleral icterus.  Neck: Normal range of motion. Neck supple. No JVD present. No thyromegaly present.  Cardiovascular: Normal rate, regular rhythm and normal heart sounds. Exam reveals no gallop.  No murmur heard. Pulmonary/Chest: Effort normal. No tachypnea. No respiratory distress. He has no decreased breath sounds. He has wheezes in the right lower field and the left lower field. He has no rhonchi. He has no rales.  Abdominal: Soft. Bowel sounds are normal. He exhibits no mass. There is no hepatosplenomegaly. There is no tenderness.  Musculoskeletal: Normal range of motion. He exhibits no edema, tenderness or deformity.  Lymphadenopathy:    He has no cervical adenopathy.  Neurological: He is alert and oriented to person, place, and time.  Skin: Skin is warm and dry. No rash noted.  Vitals reviewed.   Lab Results  Component Value Date   WBC 9.2 03/17/2018   HGB 16.5 03/17/2018   HCT 48.3 03/17/2018   PLT 243.0 03/17/2018   GLUCOSE 88 03/17/2018   CHOL 221 (H) 03/17/2018   TRIG 381.0 (H) 03/17/2018   HDL 30.90 (L) 03/17/2018   LDLDIRECT 132.0 03/17/2018   LDLCALC 91 01/06/2017   ALT 22 01/06/2017   AST 19 01/06/2017   NA 137 03/17/2018   K 4.7 03/17/2018   CL 102 03/17/2018   CREATININE 1.05 03/17/2018   BUN 22 03/17/2018   CO2 26 03/17/2018    TSH 2.26 03/17/2018   PSA 1.00 01/05/2018   HGBA1C 6.2 03/17/2018   MICROALBUR 0.7 07/26/2015   No exam data present  Assessment & Plan:   George Watson was seen today for hypertension, hyperlipidemia, copd and annual exam.  Diagnoses and all orders for this visit:  Essential hypertension, benign- His blood pressure is not well controlled.  Will try to control this with an ARB. -     olmesartan (BENICAR) 20 MG tablet; Take 1 tablet (20 mg total) by mouth daily.  Hyperlipidemia with target LDL less than 130- He is not willing to take a statin for CV risk reduction.  Mixed simple and mucopurulent chronic bronchitis ([place])- He is getting good symptom relief with the LABA/LAMA inhaler.  Will  continue Anoro. -     umeclidinium-vilanterol (ANORO ELLIPTA) 62.5-25 MCG/INH AEPB; Inhale 1 puff into the lungs daily.  Hyperglycemia- His recent A1c was up to 6.2%.  He is prediabetic.  Medical therapy is not indicated.  Routine general medical examination at a health care facility   I have discontinued George Watson's pimecrolimus, olmesartan-hydrochlorothiazide, Pitavastatin Calcium, and Cetirizine HCl. I am also having him start on olmesartan. Additionally, I am having him maintain his Fish Oil, dutasteride, Multiple Vitamins-Minerals (CENTRUM SILVER PO), sildenafil, glucosamine-chondroitin, tamsulosin, and umeclidinium-vilanterol.  Meds ordered this encounter  Medications  . olmesartan (BENICAR) 20 MG tablet    Sig: Take 1 tablet (20 mg total) by mouth daily.    Dispense:  90 tablet    Refill:  1  . umeclidinium-vilanterol (ANORO ELLIPTA) 62.5-25 MCG/INH AEPB    Sig: Inhale 1 puff into the lungs daily.    Dispense:  90 each    Refill:  1   See AVS for instructions about healthy living and anticipatory guidance.  Follow-up: Return in about 3 months (around 09/09/2018).  Scarlette Calico, MD

## 2018-06-10 NOTE — Assessment & Plan Note (Signed)
We discussed end-of-life decisions and Durene Fruits directives and he elected not to make any distinctions on either 1 of these.  The written screening recommendations is given to patient and attached in the patent instructions or AVS.   The patient is here for annual Medicare wellness examination and management of other chronic and acute problems.   The risk factors are reflected in the social history.  The roster of all physicians providing medical care to patient - is listed in the Snapshot section of the chart.  Activities of daily living:  The patient is 100% inedpendent in all ADLs: dressing, toileting, feeding as well as independent mobility  Home safety : The patient has smoke detectors in the home. They wear seatbelts.No firearms at home ( firearms are present in the home, kept in a safe fashion). There is no violence in the home.   There is no risks for hepatitis, STDs or HIV. There is no   history of blood transfusion. They have no travel history to infectious disease endemic areas of the world.  The patient has (has not) seen their dentist in the last six month. They have (not) seen their eye doctor in the last year. They deny (admit to) any hearing difficulty and have not had audiologic testing in the last year.  They do not  have excessive sun exposure. Discussed the need for sun protection: hats, long sleeves and use of sunscreen if there is significant sun exposure.   Diet: the importance of a healthy diet is discussed. They do have a healthy (unhealthy-high fat/fast food) diet.  The patient has a regular exercise program.  The benefits of regular aerobic exercise were discussed.  Depression screen: there are no signs or vegative symptoms of depression- irritability, change in appetite, anhedonia, sadness/tearfullness.  Cognitive assessment: the patient manages all their financial and personal affairs and is actively engaged. They could relate day,date,year and events; recalled 3/3  objects at 3 minutes; performed clock-face test normally.  The following portions of the patient's history were reviewed and updated as appropriate: allergies, current medications, past family history, past medical history,  past surgical history, past social history  and problem list.  Vision, hearing, body mass index were assessed and reviewed.   During the course of the visit the patient was educated and counseled about appropriate screening and preventive services including : fall prevention , diabetes screening, nutrition counseling, colorectal cancer screening, and recommended immunizations.

## 2018-06-29 DIAGNOSIS — S29012A Strain of muscle and tendon of back wall of thorax, initial encounter: Secondary | ICD-10-CM | POA: Diagnosis not present

## 2018-06-29 DIAGNOSIS — M47817 Spondylosis without myelopathy or radiculopathy, lumbosacral region: Secondary | ICD-10-CM | POA: Diagnosis not present

## 2018-06-29 DIAGNOSIS — M9903 Segmental and somatic dysfunction of lumbar region: Secondary | ICD-10-CM | POA: Diagnosis not present

## 2018-06-29 DIAGNOSIS — M47812 Spondylosis without myelopathy or radiculopathy, cervical region: Secondary | ICD-10-CM | POA: Diagnosis not present

## 2018-06-29 DIAGNOSIS — M9902 Segmental and somatic dysfunction of thoracic region: Secondary | ICD-10-CM | POA: Diagnosis not present

## 2018-06-29 DIAGNOSIS — M9901 Segmental and somatic dysfunction of cervical region: Secondary | ICD-10-CM | POA: Diagnosis not present

## 2018-08-10 DIAGNOSIS — S29012A Strain of muscle and tendon of back wall of thorax, initial encounter: Secondary | ICD-10-CM | POA: Diagnosis not present

## 2018-08-10 DIAGNOSIS — M9903 Segmental and somatic dysfunction of lumbar region: Secondary | ICD-10-CM | POA: Diagnosis not present

## 2018-08-10 DIAGNOSIS — M9901 Segmental and somatic dysfunction of cervical region: Secondary | ICD-10-CM | POA: Diagnosis not present

## 2018-08-10 DIAGNOSIS — M47812 Spondylosis without myelopathy or radiculopathy, cervical region: Secondary | ICD-10-CM | POA: Diagnosis not present

## 2018-08-10 DIAGNOSIS — M9902 Segmental and somatic dysfunction of thoracic region: Secondary | ICD-10-CM | POA: Diagnosis not present

## 2018-08-10 DIAGNOSIS — M47817 Spondylosis without myelopathy or radiculopathy, lumbosacral region: Secondary | ICD-10-CM | POA: Diagnosis not present

## 2018-08-11 DIAGNOSIS — H25811 Combined forms of age-related cataract, right eye: Secondary | ICD-10-CM | POA: Diagnosis not present

## 2018-08-11 DIAGNOSIS — H2512 Age-related nuclear cataract, left eye: Secondary | ICD-10-CM | POA: Diagnosis not present

## 2018-08-11 DIAGNOSIS — H2511 Age-related nuclear cataract, right eye: Secondary | ICD-10-CM | POA: Diagnosis not present

## 2018-08-24 DIAGNOSIS — J440 Chronic obstructive pulmonary disease with acute lower respiratory infection: Secondary | ICD-10-CM | POA: Diagnosis not present

## 2018-08-24 DIAGNOSIS — J209 Acute bronchitis, unspecified: Secondary | ICD-10-CM | POA: Diagnosis not present

## 2018-08-29 ENCOUNTER — Encounter (HOSPITAL_COMMUNITY): Payer: Self-pay | Admitting: Emergency Medicine

## 2018-08-29 ENCOUNTER — Telehealth (HOSPITAL_COMMUNITY): Payer: Self-pay

## 2018-08-29 ENCOUNTER — Ambulatory Visit (HOSPITAL_COMMUNITY)
Admission: EM | Admit: 2018-08-29 | Discharge: 2018-08-29 | Disposition: A | Discharge status: Home / Self Care | Payer: PPO | Attending: Internal Medicine | Admitting: Internal Medicine

## 2018-08-29 DIAGNOSIS — J441 Chronic obstructive pulmonary disease with (acute) exacerbation: Secondary | ICD-10-CM | POA: Diagnosis not present

## 2018-08-29 DIAGNOSIS — I10 Essential (primary) hypertension: Secondary | ICD-10-CM | POA: Diagnosis not present

## 2018-08-29 MED ORDER — METHYLPREDNISOLONE SODIUM SUCC 125 MG IJ SOLR
125.0000 mg | Freq: Once | INTRAMUSCULAR | Status: AC
  Administered 2018-08-29: 125 mg via INTRAMUSCULAR

## 2018-08-29 MED ORDER — METHYLPREDNISOLONE SODIUM SUCC 125 MG IJ SOLR
INTRAMUSCULAR | Status: AC
  Filled 2018-08-29: qty 2

## 2018-08-29 MED ORDER — IPRATROPIUM-ALBUTEROL 0.5-2.5 (3) MG/3ML IN SOLN
3.0000 mL | Freq: Once | RESPIRATORY_TRACT | Status: AC
  Administered 2018-08-29: 3 mL via RESPIRATORY_TRACT

## 2018-08-29 MED ORDER — IPRATROPIUM-ALBUTEROL 0.5-2.5 (3) MG/3ML IN SOLN
RESPIRATORY_TRACT | Status: AC
  Filled 2018-08-29: qty 3

## 2018-08-29 MED ORDER — PREDNISONE 10 MG (21) PO TBPK: ORAL_TABLET | Freq: Every day | ORAL | 0 refills | Status: DC

## 2018-08-29 NOTE — ED Provider Notes (Signed)
[facility]    CSN: [id number] Arrival date & time: 08/29/18  1131     History   Chief Complaint Chief Complaint  Patient presents with  . Shortness of Breath    HPI George Watson is a 73 y.o. male history of COPD, BPH, hypertension, presenting today for evaluation of shortness of breath.  Patient states that over the past week he has had worsening of his COPD.  He notes that he has had cough, shortness of breath and wheezing.  He was seen at a different urgent care earlier in the week and initiated on azithromycin and prednisone 40 daily for 5 days.  He states that he completed these yesterday, symptoms improved, but today when he woke up worsened again is having difficulty walking short distances without having short [place] of breath.  Denies chest pain.  He has had some mild associated rhinorrhea and sore throat.  Denies any fevers.  He is currently on Trelegy daily and albuterol as needed  HPI  Past Medical History:  Diagnosis Date  . BPH (benign prostatic hypertrophy)   . COPD (chronic obstructive pulmonary disease) ([place])   . DDD (degenerative disc disease), lumbosacral   . Elevated PSA   . Essential hypertension, benign    diet controlled  . Fracture of shaft of clavicle   . GERD (gastroesophageal reflux disease)   . H/O drug abuse ([place])    multisubstance  . Hepatitis C 10/1996  . HSV-2 (herpes simplex virus 2) infection   . Hx of biopsy 08/2004   liver  . Inguinal hernia    right  . Other and unspecified hyperlipidemia   . Prostate cancer ([place])   . Substance abuse ([place])   . Tuberculosis     Patient Active Problem List   Diagnosis Date Noted  . Fear of flying 02/11/2018  . Routine general medical examination at a health care facility 01/07/2017  . Hyperglycemia 01/06/2017  . Atherosclerosis of aorta ([place]) 07/09/2016  . Degeneration of lumbar or lumbosacral intervertebral disc 02/15/2014  . Ureteral calculus, right 02/02/2012  . Essential hypertension,  benign   . Hyperlipidemia with target LDL less than 130   . HSV-2 (herpes simplex virus 2) infection   . COPD (chronic obstructive pulmonary disease) ([place])   . Benign prostatic hyperplasia   . DDD (degenerative disc disease), lumbosacral   . Prostate cancer ([place])   . Hepatitis C 10/19/1996    Past Surgical History:  Procedure Laterality Date  . CERVICAL SPINE SURGERY    . fracture ribs     3  . HERNIA REPAIR    . INGUINAL HERNIA REPAIR  07/01/2012   Procedure: HERNIA REPAIR INGUINAL ADULT;  Surgeon: Madilyn Hook, DO;  Location: [facility];  Service: General;  Laterality: Right;  with Mesh  . left knee meniscus repair  1992  . right rotator cuff  1992  . right shoulder arthroscopy  2011       Home Medications    Prior to Admission medications   Medication Sig Start Date End Date Taking? Authorizing Provider  dutasteride (AVODART) 0.5 MG capsule Take 0.5 mg by mouth daily.    [provider]  glucosamine-chondroitin 500-400 MG tablet Take 3 tablets by mouth daily. 02/15/18   Marrian Salvage, FNP  Multiple Vitamins-Minerals (CENTRUM SILVER PO) Take by mouth.    [provider]  olmesartan (BENICAR) 20 MG tablet Take 1 tablet (20 mg total) by mouth daily. 06/09/18   Janith Lima,  MD  Omega-3 Fatty Acids (FISH OIL) 1000 MG CAPS Take 1 capsule by mouth 3 (three) times daily.     [provider]  predniSONE (STERAPRED UNI-PAK 21 TAB) 10 MG (21) TBPK tablet Take by mouth daily. Take 6 tabs by mouth daily  for 2 days, then 5 tabs for 2 days, then 4 tabs for 2 days, then 3 tabs for 2 days, 2 tabs for 2 days, then 1 tab by mouth daily for 2 days 08/29/18   ,  C, PA-C  sildenafil (VIAGRA) 100 MG tablet continuously as needed. 12/09/12   [provider]  tamsulosin (FLOMAX) 0.4 MG CAPS capsule tamsulosin 0.4 mg capsule    [provider]  umeclidinium-vilanterol (ANORO ELLIPTA) 62.5-25 MCG/INH AEPB Inhale 1 puff into the lungs  daily. 06/09/18   Janith Lima, MD    Family History Family History  Problem Relation Age of Onset  . Cancer Sister        breast  . Leukemia Mother   . Cancer Father     Social History Social History   Tobacco Use  . Smoking status: Former Smoker    Years: 43.00    Types: Cigarettes    Last attempt to quit: 07/21/2002    Years since quitting: 16.1  . Smokeless tobacco: Never Used  Substance Use Topics  . Alcohol use: No  . Drug use: No    Comment: 26 years ago cocaine, heroin. methadone     Allergies   Patient has no known allergies.   Review of Systems Review of Systems  Constitutional: Negative for activity change, appetite change, chills, fatigue and fever.  HENT: Positive for congestion and rhinorrhea. Negative for ear pain, sinus pressure, sore throat and trouble swallowing.   Eyes: Negative for discharge and redness.  Respiratory: Positive for cough, shortness of breath and wheezing. Negative for chest tightness.   Cardiovascular: Negative for chest pain.  Gastrointestinal: Negative for abdominal pain, diarrhea, nausea and vomiting.  Musculoskeletal: Negative for myalgias.  Skin: Negative for rash.  Neurological: Negative for dizziness, light-headedness and headaches.     Physical Exam Triage Vital Signs ED Triage Vitals [08/29/18 1230]  Enc Vitals Group     BP (!) 158/77     Pulse Rate 74     Resp 18     Temp 98.2 F (36.8 C)     Temp Source Temporal     SpO2 96 %     Weight      Height      Head Circumference      [place] Flow      Pain Score 3     Pain Loc      Pain Edu?      Excl. in [place]?    No data found.  Updated Vital Signs BP (!) 158/77 (BP Location: Left Arm)   Pulse 74   Temp 98.2 F (36.8 C) (Temporal)   Resp 18   SpO2 96%   Visual Acuity Right Eye Distance:   Left Eye Distance:   Bilateral Distance:    Right Eye Near:   Left Eye Near:    Bilateral Near:     Physical Exam Vitals signs and nursing note reviewed.    Constitutional:      Appearance: He is well-developed.  HENT:     Head: Normocephalic and atraumatic.     Ears:     Comments: Bilateral ears without tenderness to palpation of external auricle, tragus and mastoid, EAC's  without erythema or swelling, TM's with good bony landmarks and cone of light. Non erythematous.    Mouth/Throat:     Comments: Oral mucosa pink and moist, no tonsillar enlargement or exudate. Posterior pharynx patent and nonerythematous, no uvula deviation or swelling. Normal phonation. Eyes:     Conjunctiva/sclera: Conjunctivae normal.  Neck:     Musculoskeletal: Neck supple.  Cardiovascular:     Rate and Rhythm: Normal rate and regular rhythm.     Heart sounds: No murmur.  Pulmonary:     Effort: Pulmonary effort is normal. No respiratory distress.     Breath sounds: Wheezing and rhonchi present.     Comments: Breathing comfortably at rest, moderate wheezing and rhonchi with inspiration and expiration throughout bilateral lung fields Abdominal:     Palpations: Abdomen is soft.     Tenderness: There is no abdominal tenderness.  Skin:    General: Skin is warm and dry.  Neurological:     Mental Status: He is alert.      UC Treatments / Results  Labs (all labs ordered are listed, but only abnormal results are displayed) Labs Reviewed - No data to display  EKG None  Radiology No results found.  Procedures Procedures (including critical care time)  Medications Ordered in UC Medications  ipratropium-albuterol (DUONEB) 0.5-2.5 (3) MG/3ML nebulizer solution 3 mL (3 mLs Nebulization Given 08/29/18 1327)  methylPREDNISolone sodium succinate (SOLU-MEDROL) 125 mg/2 mL injection 125 mg (125 mg Intramuscular Given 08/29/18 1327)    Initial Impression / Assessment and Plan / UC Course  I have reviewed the triage vital signs and the nursing notes.  Pertinent labs & imaging results that were available during my care of the patient were reviewed by me and considered  in my medical decision making (see chart for details).     DuoNeb provided in clinic with moderate improvement of symptoms, but not full resolution, Solu-Medrol IM given.  Will initiate a prednisone taper beginning tomorrow.  Continue albuterol and Trelegy as prescribed.  Continue to monitor, Discussed strict return precautions. Patient verbalized understanding and is agreeable with plan.  Final Clinical Impressions(s) / UC Diagnoses   Final diagnoses:  COPD exacerbation [facility])     Discharge Instructions     Begin prednisone taper Continue inhalers Follow up if not improving    ED Prescriptions    Medication Sig Dispense Auth. Provider   predniSONE (STERAPRED UNI-PAK 21 TAB) 10 MG (21) TBPK tablet Take by mouth daily. Take 6 tabs by mouth daily  for 2 days, then 5 tabs for 2 days, then 4 tabs for 2 days, then 3 tabs for 2 days, 2 tabs for 2 days, then 1 tab by mouth daily for 2 days 21 tablet ,  C, PA-C     Controlled Substance Prescriptions [place] Controlled Substance Registry consulted? Not Applicable   Janith Lima, [place] 08/29/18 1339

## 2018-08-29 NOTE — Discharge Instructions (Signed)
Begin prednisone taper Continue inhalers Follow up if not improving

## 2018-08-29 NOTE — ED Triage Notes (Signed)
Pt sts URI sx with hx of COPD; pt sts on pred last week with some improvement but now worse again

## 2018-08-31 DIAGNOSIS — M9901 Segmental and somatic dysfunction of cervical region: Secondary | ICD-10-CM | POA: Diagnosis not present

## 2018-08-31 DIAGNOSIS — M47817 Spondylosis without myelopathy or radiculopathy, lumbosacral region: Secondary | ICD-10-CM | POA: Diagnosis not present

## 2018-08-31 DIAGNOSIS — M9902 Segmental and somatic dysfunction of thoracic region: Secondary | ICD-10-CM | POA: Diagnosis not present

## 2018-08-31 DIAGNOSIS — M9903 Segmental and somatic dysfunction of lumbar region: Secondary | ICD-10-CM | POA: Diagnosis not present

## 2018-08-31 DIAGNOSIS — S29012A Strain of muscle and tendon of back wall of thorax, initial encounter: Secondary | ICD-10-CM | POA: Diagnosis not present

## 2018-08-31 DIAGNOSIS — M47812 Spondylosis without myelopathy or radiculopathy, cervical region: Secondary | ICD-10-CM | POA: Diagnosis not present

## 2018-09-15 ENCOUNTER — Other Ambulatory Visit: Payer: Self-pay | Admitting: Internal Medicine

## 2018-09-15 DIAGNOSIS — J418 Mixed simple and mucopurulent chronic bronchitis: Secondary | ICD-10-CM

## 2018-09-16 ENCOUNTER — Other Ambulatory Visit: Payer: Self-pay | Admitting: Internal Medicine

## 2018-09-16 DIAGNOSIS — J418 Mixed simple and mucopurulent chronic bronchitis: Secondary | ICD-10-CM

## 2018-09-16 MED ORDER — ALBUTEROL SULFATE HFA 108 (90 BASE) MCG/ACT IN AERS: 2.0000 | INHALATION_SPRAY | Freq: Four times a day (QID) | RESPIRATORY_TRACT | 1 refills | Status: DC | PRN

## 2018-09-20 ENCOUNTER — Encounter: Payer: Self-pay | Admitting: Internal Medicine

## 2018-09-20 DIAGNOSIS — I10 Essential (primary) hypertension: Secondary | ICD-10-CM

## 2018-09-20 DIAGNOSIS — J418 Mixed simple and mucopurulent chronic bronchitis: Secondary | ICD-10-CM

## 2018-09-22 DIAGNOSIS — H2181 Floppy iris syndrome: Secondary | ICD-10-CM | POA: Diagnosis not present

## 2018-09-22 DIAGNOSIS — H2512 Age-related nuclear cataract, left eye: Secondary | ICD-10-CM | POA: Diagnosis not present

## 2018-09-22 DIAGNOSIS — H25812 Combined forms of age-related cataract, left eye: Secondary | ICD-10-CM | POA: Diagnosis not present

## 2018-09-23 DIAGNOSIS — Z20828 Contact with and (suspected) exposure to other viral communicable diseases: Secondary | ICD-10-CM | POA: Diagnosis not present

## 2018-09-23 MED ORDER — UMECLIDINIUM-VILANTEROL 62.5-25 MCG/INH IN AEPB: 1.0000 | INHALATION_SPRAY | Freq: Every day | RESPIRATORY_TRACT | 1 refills | Status: DC

## 2018-09-23 MED ORDER — OLMESARTAN MEDOXOMIL 20 MG PO TABS: 20.0000 mg | ORAL_TABLET | Freq: Every day | ORAL | 1 refills | Status: DC

## 2018-09-23 MED ORDER — ALBUTEROL SULFATE HFA 108 (90 BASE) MCG/ACT IN AERS: 2.0000 | INHALATION_SPRAY | Freq: Four times a day (QID) | RESPIRATORY_TRACT | 1 refills | Status: DC | PRN

## 2018-09-27 ENCOUNTER — Other Ambulatory Visit: Payer: Self-pay | Admitting: Urology

## 2018-09-27 DIAGNOSIS — C61 Malignant neoplasm of prostate: Secondary | ICD-10-CM

## 2018-09-28 ENCOUNTER — Encounter: Payer: Self-pay | Admitting: Internal Medicine

## 2018-09-30 ENCOUNTER — Encounter: Payer: Self-pay | Admitting: Internal Medicine

## 2018-10-04 ENCOUNTER — Encounter: Payer: Self-pay | Admitting: Internal Medicine

## 2018-10-04 ENCOUNTER — Ambulatory Visit (INDEPENDENT_AMBULATORY_CARE_PROVIDER_SITE_OTHER): Payer: PPO | Admitting: Internal Medicine

## 2018-10-04 ENCOUNTER — Ambulatory Visit (INDEPENDENT_AMBULATORY_CARE_PROVIDER_SITE_OTHER)
Admission: RE | Admit: 2018-10-04 | Discharge: 2018-10-04 | Disposition: A | Discharge status: Home / Self Care | Payer: PPO | Source: Ambulatory Visit | Attending: Internal Medicine | Admitting: Internal Medicine

## 2018-10-04 ENCOUNTER — Other Ambulatory Visit: Payer: Self-pay

## 2018-10-04 VITALS — BP 144/78 | HR 72 | Temp 98.4°F | Resp 16 | Ht 71.0 in | Wt 181.4 lb

## 2018-10-04 DIAGNOSIS — R0602 Shortness of breath: Secondary | ICD-10-CM | POA: Diagnosis not present

## 2018-10-04 DIAGNOSIS — J441 Chronic obstructive pulmonary disease with (acute) exacerbation: Secondary | ICD-10-CM | POA: Diagnosis not present

## 2018-10-04 MED ORDER — PREDNISONE 10 MG (21) PO TBPK: ORAL_TABLET | Freq: Every day | ORAL | 0 refills | Status: DC

## 2018-10-04 MED ORDER — AZITHROMYCIN 250 MG PO TABS: ORAL_TABLET | ORAL | 0 refills | Status: DC

## 2018-10-04 NOTE — Assessment & Plan Note (Signed)
Symptoms c/w COPD exacerbation +/- infection Unlikely flu or COVID-19 due to lack of fever and symptoms very typical of his COPD CXR  Continue home inhalers zpak Prednisone taper otc cold meds prn  Call if no improvement

## 2018-10-04 NOTE — Patient Instructions (Addendum)
Have a chest x-ray today.  We will call you with the results.     Take the antibiotic as prescribed - complete the entire course.  Take hte steroid as prescribed.  Continue over the counter cold medication, advil and tylenol.  Increase your fluids and rest.    Call if no improvement

## 2018-10-04 NOTE — Progress Notes (Signed)
Subjective:    Patient ID: GAY RAPE, male    DOB: 1945/10/16, 73 y.o.   MRN: [id number]  HPI He is here for an acute visit for symptoms c/w his COPD flares.  His symptoms started 3 days ago  He is experiencing difficulty breathing/SOB, cough, wheeze and a little fatigued.  He has brought up some discolored sputum - green in color.  He denies fever and has checked many times.  He denies nasal congestion, ear pain, sinus pain, sore throat, nausea, diarrhea, body aches, and headaches.    He has been using his inhalers.     Medications and allergies reviewed with patient and updated if appropriate.  Patient Active Problem List   Diagnosis Date Noted  . Fear of flying 02/11/2018  . Routine general medical examination at a health care facility 01/07/2017  . Hyperglycemia 01/06/2017  . Atherosclerosis of aorta ([place]) 07/09/2016  . Degeneration of lumbar or lumbosacral intervertebral disc 02/15/2014  . Ureteral calculus, right 02/02/2012  . Essential hypertension, benign   . Hyperlipidemia with target LDL less than 130   . HSV-2 (herpes simplex virus 2) infection   . COPD (chronic obstructive pulmonary disease) ([place])   . Benign prostatic hyperplasia   . DDD (degenerative disc disease), lumbosacral   . Prostate cancer ([place])   . Hepatitis C 10/19/1996    Current Outpatient Medications on File Prior to Visit  Medication Sig Dispense Refill  . albuterol (PROAIR HFA) 108 (90 Base) MCG/ACT inhaler Inhale 2 puffs into the lungs every 6 (six) hours as needed for shortness of breath. 3 Inhaler 1  . dutasteride (AVODART) 0.5 MG capsule Take 0.5 mg by mouth daily.    Marland Kitchen glucosamine-chondroitin 500-400 MG tablet Take 3 tablets by mouth daily.    . Multiple Vitamins-Minerals (CENTRUM SILVER PO) Take by mouth.    . olmesartan (BENICAR) 20 MG tablet Take 1 tablet (20 mg total) by mouth daily. 90 tablet 1  . Omega-3 Fatty Acids (FISH OIL) 1000 MG CAPS Take 1 capsule by mouth 3 (three) times  daily.     . sildenafil (VIAGRA) 100 MG tablet continuously as needed.    . tamsulosin (FLOMAX) 0.4 MG CAPS capsule tamsulosin 0.4 mg capsule    . umeclidinium-vilanterol (ANORO ELLIPTA) 62.5-25 MCG/INH AEPB Inhale 1 puff into the lungs daily. 90 each 1   No current facility-administered medications on file prior to visit.     Past Medical History:  Diagnosis Date  . BPH (benign prostatic hypertrophy)   . COPD (chronic obstructive pulmonary disease) ([place])   . DDD (degenerative disc disease), lumbosacral   . Elevated PSA   . Essential hypertension, benign    diet controlled  . Fracture of shaft of clavicle   . GERD (gastroesophageal reflux disease)   . H/O drug abuse ([place])    multisubstance  . Hepatitis C 10/1996  . HSV-2 (herpes simplex virus 2) infection   . Hx of biopsy 08/2004   liver  . Inguinal hernia    right  . Other and unspecified hyperlipidemia   . Prostate cancer ([place])   . Substance abuse ([place])   . Tuberculosis     Past Surgical History:  Procedure Laterality Date  . CERVICAL SPINE SURGERY    . fracture ribs     3  . HERNIA REPAIR    . INGUINAL HERNIA REPAIR  07/01/2012   Procedure: HERNIA REPAIR INGUINAL ADULT;  Surgeon: Madilyn Hook, DO;  Location: [facility];  Service:  General;  Laterality: Right;  with Mesh  . left knee meniscus repair  1992  . right rotator cuff  1992  . right shoulder arthroscopy  2011    Social History   Socioeconomic History  . Marital status: Married    Spouse name: Not on file  . Number of children: Not on file  . Years of education: Not on file  . Highest education level: Not on file  Occupational History  . Occupation: Diplomatic Services operational officer  . Financial resource strain: Not on file  . Food insecurity:    Worry: Not on file    Inability: Not on file  . Transportation needs:    Medical: Not on file    Non-medical: Not on file  Tobacco Use  . Smoking status: Former Smoker    Years: 43.00    Types: Cigarettes    Last  attempt to quit: 07/21/2002    Years since quitting: 16.2  . Smokeless tobacco: Never Used  Substance and Sexual Activity  . Alcohol use: No  . Drug use: No    Comment: 26 years ago cocaine, heroin. methadone  . Sexual activity: Yes    Comment: number of sex partners in the last 12 months  1  Lifestyle  . Physical activity:    Days per week: Not on file    Minutes per session: Not on file  . Stress: Not on file  Relationships  . Social connections:    Talks on phone: Not on file    Gets together: Not on file    Attends religious service: Not on file    Active member of club or organization: Not on file    Attends meetings of clubs or organizations: Not on file    Relationship status: Not on file  Other Topics Concern  . Not on file  Social History Narrative      Married to [employer]. Exercise cardio daily for 30 minutes. Education: [employer].    Family History  Problem Relation Age of Onset  . Cancer Sister        breast  . Leukemia Mother   . Cancer Father     Review of Systems  Constitutional: Negative for fever.  HENT: Negative for congestion, ear pain, sinus pain and sore throat.   Respiratory: Positive for cough (minimal green sputum this morning), shortness of breath and wheezing (chronic, increased).   Cardiovascular: Negative for chest pain.  Gastrointestinal: Negative for diarrhea and nausea.  Musculoskeletal: Negative for myalgias.  Neurological: Negative for light-headedness and headaches.       Objective:   Vitals:   10/04/18 0921  BP: (!) 144/78  Pulse: 72  Resp: 16  Temp: 98.4 F (36.9 C)  SpO2: 97%   Filed Weights   10/04/18 0921  Weight: 181 lb 6.4 oz (82.3 kg)   Body mass index is 25.3 kg/m.  Wt Readings from Last 3 Encounters:  10/04/18 181 lb 6.4 oz (82.3 kg)  06/09/18 173 lb 4 oz (78.6 kg)  03/17/18 172 lb (78 kg)     Physical Exam GENERAL APPEARANCE: Appears stated age, well appearing, NAD EYES: conjunctiva clear, no  icterus HEENT: bilateral tympanic membranes and ear canals normal, oropharynx with no erythema, no thyromegaly, trachea midline, no cervical or supraclavicular lymphadenopathy LUNGS: unlabored breathing, good air entry bilaterally diffuse wheezing with expiration, no obvious crackles CARDIOVASCULAR: Normal S1,S2 without murmurs, no edema SKIN: warm, dry        Assessment &  Plan:   See Problem List for Assessment and Plan of chronic medical problems.

## 2018-10-14 ENCOUNTER — Other Ambulatory Visit: Payer: Self-pay

## 2018-10-14 ENCOUNTER — Ambulatory Visit
Admission: RE | Admit: 2018-10-14 | Discharge: 2018-10-14 | Disposition: A | Discharge status: Home / Self Care | Payer: PPO | Source: Ambulatory Visit | Attending: Urology | Admitting: Urology

## 2018-10-14 DIAGNOSIS — C61 Malignant neoplasm of prostate: Secondary | ICD-10-CM

## 2018-10-14 MED ORDER — GADOBENATE DIMEGLUMINE 529 MG/ML IV SOLN
15.0000 mL | Freq: Once | INTRAVENOUS | Status: AC | PRN
  Administered 2018-10-14: 15 mL via INTRAVENOUS

## 2018-10-15 ENCOUNTER — Encounter: Payer: Self-pay | Admitting: Internal Medicine

## 2018-10-15 ENCOUNTER — Other Ambulatory Visit: Payer: Self-pay | Admitting: Internal Medicine

## 2018-10-15 DIAGNOSIS — J441 Chronic obstructive pulmonary disease with (acute) exacerbation: Secondary | ICD-10-CM

## 2018-10-15 MED ORDER — PREDNISONE 20 MG PO TABS: 40.0000 mg | ORAL_TABLET | Freq: Every day | ORAL | 0 refills | Status: DC

## 2018-10-18 NOTE — Telephone Encounter (Signed)
Please advise.   Copied from [place] [phone]. Topic: Appointment Scheduling - Scheduling Inquiry for Clinic >> Oct 18, 2018 10:58 AM [name] wrote: Reason for CRM: pt called and left a voicemail that states that he would like a call back on scheduling a follow up visit from 10/04/18 appointment. Please advise

## 2018-10-18 NOTE — Telephone Encounter (Signed)
lvm for pt to call back.   mychart message sent to pt stating same.

## 2018-10-19 ENCOUNTER — Other Ambulatory Visit: Payer: PPO

## 2018-10-22 DIAGNOSIS — S29012A Strain of muscle and tendon of back wall of thorax, initial encounter: Secondary | ICD-10-CM | POA: Diagnosis not present

## 2018-10-22 DIAGNOSIS — M47812 Spondylosis without myelopathy or radiculopathy, cervical region: Secondary | ICD-10-CM | POA: Diagnosis not present

## 2018-10-22 DIAGNOSIS — M9901 Segmental and somatic dysfunction of cervical region: Secondary | ICD-10-CM | POA: Diagnosis not present

## 2018-10-22 DIAGNOSIS — M9902 Segmental and somatic dysfunction of thoracic region: Secondary | ICD-10-CM | POA: Diagnosis not present

## 2018-10-22 DIAGNOSIS — M47817 Spondylosis without myelopathy or radiculopathy, lumbosacral region: Secondary | ICD-10-CM | POA: Diagnosis not present

## 2018-10-22 DIAGNOSIS — M9903 Segmental and somatic dysfunction of lumbar region: Secondary | ICD-10-CM | POA: Diagnosis not present

## 2018-11-22 DIAGNOSIS — L821 Other seborrheic keratosis: Secondary | ICD-10-CM | POA: Diagnosis not present

## 2018-11-22 DIAGNOSIS — S29012A Strain of muscle and tendon of back wall of thorax, initial encounter: Secondary | ICD-10-CM | POA: Diagnosis not present

## 2018-11-22 DIAGNOSIS — M9902 Segmental and somatic dysfunction of thoracic region: Secondary | ICD-10-CM | POA: Diagnosis not present

## 2018-11-22 DIAGNOSIS — M9903 Segmental and somatic dysfunction of lumbar region: Secondary | ICD-10-CM | POA: Diagnosis not present

## 2018-11-22 DIAGNOSIS — M47817 Spondylosis without myelopathy or radiculopathy, lumbosacral region: Secondary | ICD-10-CM | POA: Diagnosis not present

## 2018-11-22 DIAGNOSIS — L570 Actinic keratosis: Secondary | ICD-10-CM | POA: Diagnosis not present

## 2018-11-22 DIAGNOSIS — M47812 Spondylosis without myelopathy or radiculopathy, cervical region: Secondary | ICD-10-CM | POA: Diagnosis not present

## 2018-11-22 DIAGNOSIS — M9901 Segmental and somatic dysfunction of cervical region: Secondary | ICD-10-CM | POA: Diagnosis not present

## 2018-11-27 ENCOUNTER — Other Ambulatory Visit: Payer: Self-pay | Admitting: Internal Medicine

## 2018-11-27 DIAGNOSIS — J418 Mixed simple and mucopurulent chronic bronchitis: Secondary | ICD-10-CM

## 2018-12-10 DIAGNOSIS — S29012A Strain of muscle and tendon of back wall of thorax, initial encounter: Secondary | ICD-10-CM | POA: Diagnosis not present

## 2018-12-10 DIAGNOSIS — M9901 Segmental and somatic dysfunction of cervical region: Secondary | ICD-10-CM | POA: Diagnosis not present

## 2018-12-10 DIAGNOSIS — M47817 Spondylosis without myelopathy or radiculopathy, lumbosacral region: Secondary | ICD-10-CM | POA: Diagnosis not present

## 2018-12-10 DIAGNOSIS — M47812 Spondylosis without myelopathy or radiculopathy, cervical region: Secondary | ICD-10-CM | POA: Diagnosis not present

## 2018-12-10 DIAGNOSIS — M9902 Segmental and somatic dysfunction of thoracic region: Secondary | ICD-10-CM | POA: Diagnosis not present

## 2018-12-10 DIAGNOSIS — M9903 Segmental and somatic dysfunction of lumbar region: Secondary | ICD-10-CM | POA: Diagnosis not present

## 2019-01-04 DIAGNOSIS — C61 Malignant neoplasm of prostate: Secondary | ICD-10-CM | POA: Diagnosis not present

## 2019-01-06 LAB — PSA: PSA: 1.2

## 2019-01-07 DIAGNOSIS — S29012A Strain of muscle and tendon of back wall of thorax, initial encounter: Secondary | ICD-10-CM | POA: Diagnosis not present

## 2019-01-07 DIAGNOSIS — M9903 Segmental and somatic dysfunction of lumbar region: Secondary | ICD-10-CM | POA: Diagnosis not present

## 2019-01-07 DIAGNOSIS — M47817 Spondylosis without myelopathy or radiculopathy, lumbosacral region: Secondary | ICD-10-CM | POA: Diagnosis not present

## 2019-01-07 DIAGNOSIS — M9901 Segmental and somatic dysfunction of cervical region: Secondary | ICD-10-CM | POA: Diagnosis not present

## 2019-01-07 DIAGNOSIS — M9902 Segmental and somatic dysfunction of thoracic region: Secondary | ICD-10-CM | POA: Diagnosis not present

## 2019-01-07 DIAGNOSIS — M47812 Spondylosis without myelopathy or radiculopathy, cervical region: Secondary | ICD-10-CM | POA: Diagnosis not present

## 2019-01-10 DIAGNOSIS — C61 Malignant neoplasm of prostate: Secondary | ICD-10-CM | POA: Diagnosis not present

## 2019-01-10 DIAGNOSIS — N401 Enlarged prostate with lower urinary tract symptoms: Secondary | ICD-10-CM | POA: Diagnosis not present

## 2019-01-10 DIAGNOSIS — R351 Nocturia: Secondary | ICD-10-CM | POA: Diagnosis not present

## 2019-01-18 ENCOUNTER — Other Ambulatory Visit: Payer: Self-pay

## 2019-01-18 ENCOUNTER — Encounter (HOSPITAL_COMMUNITY): Payer: Self-pay

## 2019-01-18 ENCOUNTER — Ambulatory Visit: Payer: Self-pay | Admitting: *Deleted

## 2019-01-18 ENCOUNTER — Ambulatory Visit (HOSPITAL_COMMUNITY)
Admission: EM | Admit: 2019-01-18 | Discharge: 2019-01-18 | Disposition: A | Discharge status: Home / Self Care | Payer: PPO | Attending: Family Medicine | Admitting: Family Medicine

## 2019-01-18 DIAGNOSIS — R0602 Shortness of breath: Secondary | ICD-10-CM

## 2019-01-18 DIAGNOSIS — R062 Wheezing: Secondary | ICD-10-CM

## 2019-01-18 MED ORDER — PREDNISONE 10 MG (21) PO TBPK: ORAL_TABLET | Freq: Every day | ORAL | 0 refills | Status: DC

## 2019-01-18 NOTE — ED Notes (Signed)
Notified dr hagler patient wanting to speak to physician prior to having chest xray that was ordered.

## 2019-01-18 NOTE — Discharge Instructions (Addendum)
You have been seen at the [facility] today for shortness of breath. Your evaluation today was not suggestive of any emergent condition requiring medical intervention at this time. Your ECG (heart tracing) did not show any worrisome changes. However, some medical problems make take more time to appear. Therefore, it's very important that you pay attention to any new symptoms or worsening of your current condition.  Please proceed directly to the Emergency Department immediately should you feel worse in any way or have any of the following symptoms: chest pain, pain that spreads to your arm, neck, jaw, back or abdomen, returning or worsening shortness of breath, or nausea and vomiting.

## 2019-01-18 NOTE — ED Provider Notes (Signed)
[facility]   [id number] 01/18/19 Arrival Time: 4315  ASSESSMENT & PLAN:  1. SOB (shortness of breath)   2. Wheezing    SOB has resolved. No indication of CV etiology. He is feeling normal now. Declines CXR.   With wheezing, will begin: Meds ordered this encounter  Medications   predniSONE (STERAPRED UNI-PAK 21 TAB) 10 MG (21) TBPK tablet    Sig: Take by mouth daily. Take as directed.    Dispense:  21 tablet    Refill:  0    ECG: Performed today and interpreted by me: normal EKG, normal sinus rhythm.  Follow-up Information    Janith Lima, MD.   Specialty: Internal Medicine Why: As needed. Contact information: [address] [place] [address] [phone]        [facility].   Specialty: Urgent Care Why: As needed. Contact information: [place] [phone]         Reviewed expectations re: course of current medical issues. Questions answered. Outlined signs and symptoms indicating need for more acute intervention. Patient verbalized understanding. After Visit Summary given.   SUBJECTIVE:  History from: patient. George Watson is a 73 y.o. male who presents with complaint of isolated episode of feeling SOB without chest pain. Noticed after exercising. H/O COPD; he does have baseline sporadic episodes of feeling SOB 'but this time it felt a little worse'. Does feel that he was wheezing at that time. Symptoms lasted 30 min - 1 hour and have resolved. Reports no associated n/v. Denies: fatigue, irregular heart beat, lower extremity edema, near-syncope, orthopnea, palpitations, paroxysmal nocturnal dyspnea and syncope. No specific aggravating or alleviating factors reported while symptoms were present. Recent illnesses: none. Fever: absent. Ambulatory without assistance. No recent prolonged immobility. Self/OTC treatment: none reported. Illicit drug  use: none. Social History   Tobacco Use  Smoking Status Former Smoker   Years: 43.00   Types: Cigarettes   Quit date: 07/21/2002   Years since quitting: 16.5  Smokeless Tobacco Never Used    ROS: As per HPI. All other systems negative.   OBJECTIVE:  Vitals:   01/18/19 1701  BP: 138/79  Pulse: 66  Resp: 18  Temp: 97.9 F (36.6 C)  TempSrc: Oral  SpO2: 97%    General appearance: alert, oriented, no acute distress Eyes: PERRLA; EOMI; conjunctivae normal HENT: normocephalic; atraumatic Neck: supple with FROM Lungs: without labored respirations, moderate bilateral expiratory wheezes present Heart: regular rate and rhythm without murmer Chest Wall: without tenderness to palpation Abdomen: soft, non-tender; bowel sounds normal; no masses or organomegaly; no guarding or rebound tenderness Extremities: without edema; without calf swelling or tenderness; symmetrical without gross deformities Skin: warm and dry; without rash or lesions Psychological: alert and cooperative; normal mood and affect   No Known Allergies  Past Medical History:  Diagnosis Date   BPH (benign prostatic hypertrophy)    COPD (chronic obstructive pulmonary disease) ([facility])    DDD (degenerative disc disease), lumbosacral    Elevated PSA    Essential hypertension, benign    diet controlled   Fracture of shaft of clavicle    GERD (gastroesophageal reflux disease)    H/O drug abuse ([place])    multisubstance   Hepatitis C 10/1996   HSV-2 (herpes simplex virus 2) infection    Hx of biopsy 08/2004   liver   Inguinal hernia    right   Other and unspecified hyperlipidemia  Prostate cancer ([place])    Substance abuse ([place])    Tuberculosis    Social History   Socioeconomic History   Marital status: Married    Spouse name: Not on file   Number of children: Not on file   Years of education: Not on file   Highest education level: Not on file  Occupational History   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 methadone   Sexual activity: Yes    Comment: number of sex partners in the last 12 months  1  Lifestyle   Physical activity    Days per week: Not on file    Minutes per session: Not on file   Stress: Not on file  Relationships   Social connections    Talks on phone: Not on file    Gets together: Not on file    Attends religious service: Not on file    Active member of club or organization: Not on file    Attends meetings of clubs or organizations: Not on file    Relationship status: Not on file   Intimate partner violence    Fear of current or ex partner: Not on file    Emotionally abused: Not on file    Physically abused: Not on file    Forced sexual activity: Not on file  Other Topics Concern   Not on file  Social History Narrative      Married to [place] white. Exercise cardio daily for 30 minutes. Education: [employer].   Family History  Problem Relation Age of Onset   Cancer Sister        breast   Leukemia Mother    Cancer Father    Past Surgical History:  Procedure Laterality Date   CERVICAL SPINE SURGERY     fracture ribs     3   HERNIA REPAIR     INGUINAL HERNIA REPAIR  07/01/2012   Procedure: HERNIA REPAIR INGUINAL ADULT;  Surgeon: Madilyn Hook, DO;  Location: [facility];  Service: General;  Laterality: Right;  with Mesh   left knee meniscus repair  1992   right rotator cuff  1992   right shoulder arthroscopy  2011     Vanessa Kick, MD 01/21/19 [phone]

## 2019-01-18 NOTE — Telephone Encounter (Signed)
Pt calling with shortness of breath when he is walking and he gets light headed. He has a hx of emphysema. He denies fever, chest pain, headache, or runny nose. He states that he rides a bike daily. He stated that he would go to [facility] to be seen. Routing to [facility] at [place] for review.   Reason for Disposition . [1] MILD difficulty breathing (e.g., minimal/no SOB at rest, SOB with walking, pulse <100) AND [2] NEW-onset or WORSE than normal  Answer Assessment - Initial Assessment Questions 1. RESPIRATORY STATUS: "Describe your breathing?" (e.g., wheezing, shortness of breath, unable to speak, severe coughing)      Shortness of breath when walking  2. ONSET: "When did this breathing problem begin?"      today 3. PATTERN "Does the difficult breathing come and go, or has it been constant since it started?"      Come and goes 4. SEVERITY: "How bad is your breathing?" (e.g., mild, moderate, severe)    - MILD: No SOB at rest, mild SOB with walking, speaks normally in sentences, can lay down, no retractions, pulse < 100.    - MODERATE: SOB at rest, SOB with minimal exertion and prefers to sit, cannot lie down flat, speaks in phrases, mild retractions, audible wheezing, pulse 100-120.    - SEVERE: Very SOB at rest, speaks in single words, struggling to breathe, sitting hunched forward, retractions, pulse > 120      Mild to moderate 5. RECURRENT SYMPTOM: "Have you had difficulty breathing before?" If so, ask: "When was the last time?" and "What happened that time?"      yes 6. CARDIAC HISTORY: "Do you have any history of heart disease?" (e.g., heart attack, angina, bypass surgery, angioplasty)      no 7. LUNG HISTORY: "Do you have any history of lung disease?"  (e.g., pulmonary embolus, asthma, emphysema)     emphysema 8. CAUSE: "What do you think is causing the breathing problem?"      Don't know 9. OTHER SYMPTOMS: "Do you have any other symptoms? (e.g., dizziness, runny nose, cough,  chest pain, fever)     Dizziness  10. PREGNANCY: "Is there any chance you are pregnant?" "When was your last menstrual period?"       no 11. TRAVEL: "Have you traveled out of the country in the last month?" (e.g., travel history, exposures)  Protocols used: BREATHING DIFFICULTY-A-AH

## 2019-01-18 NOTE — ED Triage Notes (Signed)
Patient presents to Urgent Care with complaints of shortness of breath since today after exercising. Patient reports he has emphysema and SOB is not unusual for him but it seems worse today.

## 2019-01-19 ENCOUNTER — Encounter: Payer: Self-pay | Admitting: Internal Medicine

## 2019-01-20 ENCOUNTER — Other Ambulatory Visit: Payer: Self-pay

## 2019-01-20 ENCOUNTER — Ambulatory Visit (INDEPENDENT_AMBULATORY_CARE_PROVIDER_SITE_OTHER): Payer: PPO | Admitting: Internal Medicine

## 2019-01-20 ENCOUNTER — Other Ambulatory Visit (INDEPENDENT_AMBULATORY_CARE_PROVIDER_SITE_OTHER): Payer: PPO

## 2019-01-20 ENCOUNTER — Encounter: Payer: Self-pay | Admitting: Internal Medicine

## 2019-01-20 VITALS — BP 142/74 | HR 83 | Temp 98.6°F | Resp 16 | Ht 71.0 in | Wt 174.0 lb

## 2019-01-20 DIAGNOSIS — I10 Essential (primary) hypertension: Secondary | ICD-10-CM

## 2019-01-20 DIAGNOSIS — J418 Mixed simple and mucopurulent chronic bronchitis: Secondary | ICD-10-CM

## 2019-01-20 DIAGNOSIS — E785 Hyperlipidemia, unspecified: Secondary | ICD-10-CM

## 2019-01-20 DIAGNOSIS — C61 Malignant neoplasm of prostate: Secondary | ICD-10-CM

## 2019-01-20 DIAGNOSIS — E781 Pure hyperglyceridemia: Secondary | ICD-10-CM | POA: Diagnosis not present

## 2019-01-20 DIAGNOSIS — R739 Hyperglycemia, unspecified: Secondary | ICD-10-CM | POA: Diagnosis not present

## 2019-01-20 DIAGNOSIS — J441 Chronic obstructive pulmonary disease with (acute) exacerbation: Secondary | ICD-10-CM | POA: Diagnosis not present

## 2019-01-20 DIAGNOSIS — N400 Enlarged prostate without lower urinary tract symptoms: Secondary | ICD-10-CM

## 2019-01-20 LAB — CBC WITH DIFFERENTIAL/PLATELET
Basophils Absolute: 0 10*3/uL (ref 0.0–0.1)
Basophils Relative: 0.3 % (ref 0.0–3.0)
Eosinophils Absolute: 0 10*3/uL (ref 0.0–0.7)
Eosinophils Relative: 0 % (ref 0.0–5.0)
HCT: 45.4 % (ref 39.0–52.0)
Hemoglobin: 15.3 g/dL (ref 13.0–17.0)
Lymphocytes Relative: 18.9 % (ref 12.0–46.0)
Lymphs Abs: 1.2 10*3/uL (ref 0.7–4.0)
MCHC: 33.6 g/dL (ref 30.0–36.0)
MCV: 92.6 fl (ref 78.0–100.0)
Monocytes Absolute: 0.2 10*3/uL (ref 0.1–1.0)
Monocytes Relative: 3.9 % (ref 3.0–12.0)
Neutro Abs: 4.7 10*3/uL (ref 1.4–7.7)
Neutrophils Relative %: 76.9 % (ref 43.0–77.0)
Platelets: 273 10*3/uL (ref 150.0–400.0)
RBC: 4.91 Mil/uL (ref 4.22–5.81)
RDW: 13.1 % (ref 11.5–15.5)
WBC: 6.2 10*3/uL (ref 4.0–10.5)

## 2019-01-20 LAB — TSH: TSH: 0.86 u[IU]/mL (ref 0.35–4.50)

## 2019-01-20 LAB — BASIC METABOLIC PANEL
BUN: 26 mg/dL — ABNORMAL HIGH (ref 6–23)
CO2: 23 mEq/L (ref 19–32)
Calcium: 9.5 mg/dL (ref 8.4–10.5)
Chloride: 105 mEq/L (ref 96–112)
Creatinine, Ser: 1.01 mg/dL (ref 0.40–1.50)
GFR: 72.66 mL/min (ref >60.00)
Glucose, Bld: 179 mg/dL — ABNORMAL HIGH (ref 70–99)
Potassium: 4.6 mEq/L (ref 3.5–5.1)
Sodium: 138 mEq/L (ref 135–145)

## 2019-01-20 LAB — LIPID PANEL
Cholesterol: 223 mg/dL — ABNORMAL HIGH (ref 0–200)
HDL: 32.8 mg/dL — ABNORMAL LOW (ref >39.00)
NonHDL: 190.51
Total CHOL/HDL Ratio: 7
Triglycerides: 228 mg/dL — ABNORMAL HIGH (ref 0.0–149.0)
VLDL: 45.6 mg/dL — ABNORMAL HIGH (ref 0.0–40.0)

## 2019-01-20 LAB — HEMOGLOBIN A1C: Hgb A1c MFr Bld: 6.2 % (ref 4.6–6.5)

## 2019-01-20 LAB — LDL CHOLESTEROL, DIRECT: Direct LDL: 133 mg/dL

## 2019-01-20 MED ORDER — PREDNISONE 50 MG PO TABS: 50.0000 mg | ORAL_TABLET | Freq: Every day | ORAL | 0 refills | Status: DC

## 2019-01-20 NOTE — Progress Notes (Signed)
Subjective:  Patient ID: George Watson, male    DOB: 03-06-1946  Age: 73 y.o. MRN: [id number]  CC: Cough and COPD   HPI George Watson presents for concerns about a one-week history of shortness of breath, cough productive of clear phlegm, dizziness, weakness, and wheezing.  He was seen at an urgent care center 1 day prior to this visit and his EKG was normal.  He has refused to undergo a chest x-ray.  He tells me he is compliant with LABA/lAMA inhaler.  He is frustrated that he is having so many exacerbations of COPD.  Outpatient Medications Prior to Visit  Medication Sig Dispense Refill  . albuterol (PROAIR HFA) 108 (90 Base) MCG/ACT inhaler Inhale 2 puffs into the lungs every 6 (six) hours as needed for shortness of breath. 3 Inhaler 1  . ANORO ELLIPTA 62.5-25 MCG/INH AEPB TAKE 1 PUFF BY MOUTH EVERY DAY 120 each 1  . dutasteride (AVODART) 0.5 MG capsule Take 0.5 mg by mouth daily.    Marland Kitchen glucosamine-chondroitin 500-400 MG tablet Take 3 tablets by mouth daily.    . Multiple Vitamins-Minerals (CENTRUM SILVER PO) Take by mouth.    . sildenafil (VIAGRA) 100 MG tablet continuously as needed.    . tamsulosin (FLOMAX) 0.4 MG CAPS capsule Take 0.4 mg by mouth daily.     Marland Kitchen olmesartan (BENICAR) 20 MG tablet Take 1 tablet (20 mg total) by mouth daily. 90 tablet 1  . Omega-3 Fatty Acids (FISH OIL) 1000 MG CAPS Take 1 capsule by mouth 3 (three) times daily.     . predniSONE (STERAPRED UNI-PAK 21 TAB) 10 MG (21) TBPK tablet Take by mouth daily. Take as directed. 21 tablet 0  . azithromycin (ZITHROMAX) 250 MG tablet Take two tabs the first day and then one tab daily for four days (Patient not taking: Reported on 01/20/2019) 6 tablet 0   No facility-administered medications prior to visit.     ROS Review of Systems  Constitutional: Negative.  Negative for chills, diaphoresis, fatigue and fever.  HENT: Negative.  Negative for facial swelling, sore throat and trouble swallowing.   Respiratory: Positive  for cough, shortness of breath and wheezing. Negative for chest tightness and stridor.   Cardiovascular: Negative for chest pain, palpitations and leg swelling.  Gastrointestinal: Negative for abdominal pain, diarrhea, nausea and vomiting.  Endocrine: Negative.   Genitourinary: Negative.  Negative for difficulty urinating.  Musculoskeletal: Negative.  Negative for arthralgias and myalgias.  Skin: Negative.  Negative for color change and rash.  Neurological: Positive for weakness. Negative for dizziness and light-headedness.  Hematological: Negative for adenopathy. Does not bruise/bleed easily.  Psychiatric/Behavioral: Negative.     Objective:  BP (!) 142/74 (BP Location: Left Arm, Patient Position: Sitting, Cuff Size: Normal)   Pulse 83   Temp 98.6 F (37 C) (Oral)   Resp 16   Ht 5\' 11"  (1.803 m)   Wt 174 lb (78.9 kg)   SpO2 94%   BMI 24.27 kg/m   BP Readings from Last 3 Encounters:  01/20/19 (!) 142/74  01/18/19 138/79  10/04/18 (!) 144/78    Wt Readings from Last 3 Encounters:  01/20/19 174 lb (78.9 kg)  10/04/18 181 lb 6.4 oz (82.3 kg)  06/09/18 173 lb 4 oz (78.6 kg)    Physical Exam Vitals signs reviewed.  Constitutional:      General: He is not in acute distress.    Appearance: He is not ill-appearing, toxic-appearing or diaphoretic.  HENT:  Nose: Nose normal.     Mouth/Throat:     Mouth: Mucous membranes are moist.     Pharynx: No oropharyngeal exudate or posterior oropharyngeal erythema.  Eyes:     General: No scleral icterus.    Conjunctiva/sclera: Conjunctivae normal.  Neck:     Musculoskeletal: Normal range of motion. No neck rigidity or muscular tenderness.  Cardiovascular:     Rate and Rhythm: Normal rate and regular rhythm.     Heart sounds: No murmur. No gallop.   Pulmonary:     Effort: Pulmonary effort is normal. No tachypnea, accessory muscle usage, respiratory distress or retractions.     Breath sounds: No stridor. Examination of the  right-middle field reveals wheezing. Examination of the left-middle field reveals wheezing. Examination of the right-lower field reveals wheezing. Examination of the left-lower field reveals wheezing. Wheezing present. No rhonchi or rales.     Comments: Inspiratory wheezes in both lung bases.  No rhonchi.  Good air movement. Abdominal:     General: Abdomen is flat.     Palpations: There is no mass.     Tenderness: There is no abdominal tenderness. There is no guarding.  Musculoskeletal: Normal range of motion.        General: No swelling.     Right lower leg: No edema.     Left lower leg: No edema.  Lymphadenopathy:     Cervical: No cervical adenopathy.  Skin:    General: Skin is warm.     Coloration: Skin is not jaundiced or pale.  Neurological:     General: No focal deficit present.     Mental Status: He is alert. Mental status is at baseline.  Psychiatric:        Mood and Affect: Mood normal.     Lab Results  Component Value Date   WBC 6.2 01/20/2019   HGB 15.3 01/20/2019   HCT 45.4 01/20/2019   PLT 273.0 01/20/2019   GLUCOSE 179 (H) 01/20/2019   CHOL 223 (H) 01/20/2019   TRIG 228.0 (H) 01/20/2019   HDL 32.80 (L) 01/20/2019   LDLDIRECT 133.0 01/20/2019   LDLCALC 91 01/06/2017   ALT 22 01/06/2017   AST 19 01/06/2017   NA 138 01/20/2019   K 4.6 01/20/2019   CL 105 01/20/2019   CREATININE 1.01 01/20/2019   BUN 26 (H) 01/20/2019   CO2 23 01/20/2019   TSH 0.86 01/20/2019   PSA 1.2 01/06/2019   HGBA1C 6.2 01/20/2019   MICROALBUR 0.7 07/26/2015    No results found.  Assessment & Plan:   Cartrell was seen today for cough and copd.  Diagnoses and all orders for this visit:  Essential hypertension, benign- His blood pressure is not adequately well controlled so have asked him to double the dose of his ARB. -     Basic metabolic panel; Future -     CBC with Differential/Platelet; Future -     olmesartan (BENICAR) 40 MG tablet; Take 1 tablet (40 mg total) by mouth  daily.  Hyperglycemia - His A1c remains at 6.2%.  He is prediabetic.  Medical therapy is not indicated. -     Basic metabolic panel; Future -     Hemoglobin A1c; Future  Hyperlipidemia with target LDL less than 130- He has an elevated ASCVD risk score so I have asked him to start taking a statin for CV risk reduction. -     Lipid panel; Future -     TSH; Future -  Pitavastatin Calcium (LIVALO) 2 MG TABS; Take 1 tablet (2 mg total) by mouth daily.  Prostate cancer [facility])- He saw his urologist 2 weeks ago and his PSA remains low.  This is reassuring that there is no recurrence of prostate cancer. -     Cancel: PSA; Future  Benign prostatic hyperplasia without lower urinary tract symptoms -     Cancel: PSA; Future  COPD exacerbation (HCC) -     predniSONE (DELTASONE) 50 MG tablet; Take 1 tablet (50 mg total) by mouth daily with breakfast.  Mixed simple and mucopurulent chronic bronchitis ([place])- He has had frequent exacerbations over the last year or 2 so I have asked him to add an ICS to the LAMA/LABA and to add on Roflumilast as well. -     fluticasone (FLOVENT HFA) 110 MCG/ACT inhaler; Inhale 1 puff into the lungs 2 (two) times a day. -     Roflumilast (DALIRESP) 250 MCG TABS; Take 1 tablet by mouth daily.  Pure hypertriglyceridemia- He has an elevated ASCVD risk score so I have asked him to start taking VASCEPA to lower the triglycerides, to reduce the complications of hypertriglyceridemia, and for CV risk reduction. -     Icosapent Ethyl (VASCEPA) 1 g CAPS; Take 2 capsules (2 g total) by mouth 2 (two) times daily.   I have discontinued Bruk P. Dannemiller's Fish Oil, olmesartan, azithromycin, and predniSONE. I am also having him start on predniSONE, fluticasone, Daliresp, olmesartan, Vascepa, and Livalo. Additionally, I am having him maintain his dutasteride, Multiple Vitamins-Minerals (CENTRUM SILVER PO), sildenafil, glucosamine-chondroitin, tamsulosin, albuterol, and Anoro Ellipta.   Meds ordered this encounter  Medications  . predniSONE (DELTASONE) 50 MG tablet    Sig: Take 1 tablet (50 mg total) by mouth daily with breakfast.    Dispense:  5 tablet    Refill:  0  . fluticasone (FLOVENT HFA) 110 MCG/ACT inhaler    Sig: Inhale 1 puff into the lungs 2 (two) times a day.    Dispense:  3 Inhaler    Refill:  1  . Roflumilast (DALIRESP) 250 MCG TABS    Sig: Take 1 tablet by mouth daily.    Dispense:  90 tablet    Refill:  1  . olmesartan (BENICAR) 40 MG tablet    Sig: Take 1 tablet (40 mg total) by mouth daily.    Dispense:  90 tablet    Refill:  1  . Icosapent Ethyl (VASCEPA) 1 g CAPS    Sig: Take 2 capsules (2 g total) by mouth 2 (two) times daily.    Dispense:  360 capsule    Refill:  1  . Pitavastatin Calcium (LIVALO) 2 MG TABS    Sig: Take 1 tablet (2 mg total) by mouth daily.    Dispense:  90 tablet    Refill:  1     Follow-up: Return if symptoms worsen or fail to improve.  Scarlette Calico, MD

## 2019-01-20 NOTE — Patient Instructions (Signed)

## 2019-01-21 ENCOUNTER — Encounter: Payer: Self-pay | Admitting: Internal Medicine

## 2019-01-21 MED ORDER — OLMESARTAN MEDOXOMIL 40 MG PO TABS: 40.0000 mg | ORAL_TABLET | Freq: Every day | ORAL | 1 refills | Status: DC

## 2019-01-21 MED ORDER — LIVALO 2 MG PO TABS: 1.0000 | ORAL_TABLET | Freq: Every day | ORAL | 1 refills | Status: DC

## 2019-01-21 MED ORDER — FLUTICASONE PROPIONATE HFA 110 MCG/ACT IN AERO: 1.0000 | INHALATION_SPRAY | Freq: Two times a day (BID) | RESPIRATORY_TRACT | 1 refills | Status: DC

## 2019-01-21 MED ORDER — DALIRESP 250 MCG PO TABS: 1.0000 | ORAL_TABLET | Freq: Every day | ORAL | 1 refills | Status: DC

## 2019-01-21 MED ORDER — VASCEPA 1 G PO CAPS: 2.0000 | ORAL_CAPSULE | Freq: Two times a day (BID) | ORAL | 1 refills | Status: DC

## 2019-01-22 ENCOUNTER — Encounter: Payer: Self-pay | Admitting: Internal Medicine

## 2019-02-02 DIAGNOSIS — M9903 Segmental and somatic dysfunction of lumbar region: Secondary | ICD-10-CM | POA: Diagnosis not present

## 2019-02-02 DIAGNOSIS — M47812 Spondylosis without myelopathy or radiculopathy, cervical region: Secondary | ICD-10-CM | POA: Diagnosis not present

## 2019-02-02 DIAGNOSIS — M47817 Spondylosis without myelopathy or radiculopathy, lumbosacral region: Secondary | ICD-10-CM | POA: Diagnosis not present

## 2019-02-02 DIAGNOSIS — M9902 Segmental and somatic dysfunction of thoracic region: Secondary | ICD-10-CM | POA: Diagnosis not present

## 2019-02-02 DIAGNOSIS — S29012A Strain of muscle and tendon of back wall of thorax, initial encounter: Secondary | ICD-10-CM | POA: Diagnosis not present

## 2019-02-02 DIAGNOSIS — M9901 Segmental and somatic dysfunction of cervical region: Secondary | ICD-10-CM | POA: Diagnosis not present

## 2019-02-03 DIAGNOSIS — L249 Irritant contact dermatitis, unspecified cause: Secondary | ICD-10-CM | POA: Diagnosis not present

## 2019-02-11 DIAGNOSIS — B182 Chronic viral hepatitis C: Secondary | ICD-10-CM | POA: Diagnosis not present

## 2019-02-22 ENCOUNTER — Encounter: Payer: Self-pay | Admitting: Internal Medicine

## 2019-02-23 DIAGNOSIS — B182 Chronic viral hepatitis C: Secondary | ICD-10-CM | POA: Diagnosis not present

## 2019-03-03 DIAGNOSIS — K7469 Other cirrhosis of liver: Secondary | ICD-10-CM | POA: Diagnosis not present

## 2019-03-03 DIAGNOSIS — B1920 Unspecified viral hepatitis C without hepatic coma: Secondary | ICD-10-CM | POA: Diagnosis not present

## 2019-03-14 DIAGNOSIS — M9901 Segmental and somatic dysfunction of cervical region: Secondary | ICD-10-CM | POA: Diagnosis not present

## 2019-03-14 DIAGNOSIS — M47812 Spondylosis without myelopathy or radiculopathy, cervical region: Secondary | ICD-10-CM | POA: Diagnosis not present

## 2019-03-14 DIAGNOSIS — S29012A Strain of muscle and tendon of back wall of thorax, initial encounter: Secondary | ICD-10-CM | POA: Diagnosis not present

## 2019-03-14 DIAGNOSIS — M47817 Spondylosis without myelopathy or radiculopathy, lumbosacral region: Secondary | ICD-10-CM | POA: Diagnosis not present

## 2019-03-14 DIAGNOSIS — M9903 Segmental and somatic dysfunction of lumbar region: Secondary | ICD-10-CM | POA: Diagnosis not present

## 2019-03-14 DIAGNOSIS — M9902 Segmental and somatic dysfunction of thoracic region: Secondary | ICD-10-CM | POA: Diagnosis not present

## 2019-03-29 DIAGNOSIS — M9903 Segmental and somatic dysfunction of lumbar region: Secondary | ICD-10-CM | POA: Diagnosis not present

## 2019-03-29 DIAGNOSIS — M47812 Spondylosis without myelopathy or radiculopathy, cervical region: Secondary | ICD-10-CM | POA: Diagnosis not present

## 2019-03-29 DIAGNOSIS — M9901 Segmental and somatic dysfunction of cervical region: Secondary | ICD-10-CM | POA: Diagnosis not present

## 2019-03-29 DIAGNOSIS — S29012A Strain of muscle and tendon of back wall of thorax, initial encounter: Secondary | ICD-10-CM | POA: Diagnosis not present

## 2019-03-29 DIAGNOSIS — M47817 Spondylosis without myelopathy or radiculopathy, lumbosacral region: Secondary | ICD-10-CM | POA: Diagnosis not present

## 2019-03-29 DIAGNOSIS — M9902 Segmental and somatic dysfunction of thoracic region: Secondary | ICD-10-CM | POA: Diagnosis not present

## 2019-04-06 ENCOUNTER — Other Ambulatory Visit: Payer: Self-pay | Admitting: Internal Medicine

## 2019-04-06 DIAGNOSIS — J418 Mixed simple and mucopurulent chronic bronchitis: Secondary | ICD-10-CM

## 2019-04-08 ENCOUNTER — Other Ambulatory Visit: Payer: Self-pay | Admitting: Internal Medicine

## 2019-04-08 ENCOUNTER — Encounter: Payer: Self-pay | Admitting: Internal Medicine

## 2019-04-08 DIAGNOSIS — E781 Pure hyperglyceridemia: Secondary | ICD-10-CM | POA: Insufficient documentation

## 2019-04-08 DIAGNOSIS — E785 Hyperlipidemia, unspecified: Secondary | ICD-10-CM

## 2019-04-08 MED ORDER — OMEGA-3-ACID ETHYL ESTERS 1 G PO CAPS: 2.0000 g | ORAL_CAPSULE | Freq: Two times a day (BID) | ORAL | 1 refills | Status: DC

## 2019-04-14 ENCOUNTER — Other Ambulatory Visit: Payer: Self-pay | Admitting: Internal Medicine

## 2019-04-14 DIAGNOSIS — J418 Mixed simple and mucopurulent chronic bronchitis: Secondary | ICD-10-CM

## 2019-04-25 ENCOUNTER — Telehealth: Payer: Self-pay | Admitting: Internal Medicine

## 2019-04-25 ENCOUNTER — Other Ambulatory Visit (INDEPENDENT_AMBULATORY_CARE_PROVIDER_SITE_OTHER): Payer: PPO

## 2019-04-25 ENCOUNTER — Other Ambulatory Visit: Payer: Self-pay

## 2019-04-25 ENCOUNTER — Encounter: Payer: Self-pay | Admitting: Internal Medicine

## 2019-04-25 ENCOUNTER — Ambulatory Visit (INDEPENDENT_AMBULATORY_CARE_PROVIDER_SITE_OTHER): Payer: PPO | Admitting: Internal Medicine

## 2019-04-25 ENCOUNTER — Ambulatory Visit (INDEPENDENT_AMBULATORY_CARE_PROVIDER_SITE_OTHER)
Admission: RE | Admit: 2019-04-25 | Discharge: 2019-04-25 | Disposition: A | Discharge status: Home / Self Care | Payer: PPO | Source: Ambulatory Visit | Attending: Internal Medicine | Admitting: Internal Medicine

## 2019-04-25 VITALS — BP 140/84 | HR 80 | Temp 98.4°F | Resp 16 | Ht 71.0 in | Wt 172.5 lb

## 2019-04-25 DIAGNOSIS — R7303 Prediabetes: Secondary | ICD-10-CM

## 2019-04-25 DIAGNOSIS — R0781 Pleurodynia: Secondary | ICD-10-CM

## 2019-04-25 DIAGNOSIS — R079 Chest pain, unspecified: Secondary | ICD-10-CM | POA: Diagnosis not present

## 2019-04-25 DIAGNOSIS — R091 Pleurisy: Secondary | ICD-10-CM

## 2019-04-25 DIAGNOSIS — R0602 Shortness of breath: Secondary | ICD-10-CM | POA: Diagnosis not present

## 2019-04-25 LAB — CBC WITH DIFFERENTIAL/PLATELET
Basophils Absolute: 0.1 10*3/uL (ref 0.0–0.1)
Basophils Relative: 0.8 % (ref 0.0–3.0)
Eosinophils Absolute: 1.2 10*3/uL — ABNORMAL HIGH (ref 0.0–0.7)
Eosinophils Relative: 11.6 % — ABNORMAL HIGH (ref 0.0–5.0)
HCT: 44.5 % (ref 39.0–52.0)
Hemoglobin: 15.2 g/dL (ref 13.0–17.0)
Lymphocytes Relative: 32.9 % (ref 12.0–46.0)
Lymphs Abs: 3.3 10*3/uL (ref 0.7–4.0)
MCHC: 34.2 g/dL (ref 30.0–36.0)
MCV: 92.7 fl (ref 78.0–100.0)
Monocytes Absolute: 1.2 10*3/uL — ABNORMAL HIGH (ref 0.1–1.0)
Monocytes Relative: 12.6 % — ABNORMAL HIGH (ref 3.0–12.0)
Neutro Abs: 4.2 10*3/uL (ref 1.4–7.7)
Neutrophils Relative %: 42.1 % — ABNORMAL LOW (ref 43.0–77.0)
Platelets: 268 10*3/uL (ref 150.0–400.0)
RBC: 4.8 Mil/uL (ref 4.22–5.81)
RDW: 13 % (ref 11.5–15.5)
WBC: 9.9 10*3/uL (ref 4.0–10.5)

## 2019-04-25 LAB — BASIC METABOLIC PANEL
BUN: 20 mg/dL (ref 6–23)
CO2: 26 mEq/L (ref 19–32)
Calcium: 10 mg/dL (ref 8.4–10.5)
Chloride: 106 mEq/L (ref 96–112)
Creatinine, Ser: 0.88 mg/dL (ref 0.40–1.50)
GFR: 85.12 mL/min (ref >60.00)
Glucose, Bld: 91 mg/dL (ref 70–99)
Potassium: 4 mEq/L (ref 3.5–5.1)
Sodium: 140 mEq/L (ref 135–145)

## 2019-04-25 LAB — D-DIMER, QUANTITATIVE: D-Dimer, Quant: 0.39 mcg/mL FEU (ref <0.50)

## 2019-04-25 MED ORDER — IBUPROFEN 600 MG PO TABS: 600.0000 mg | ORAL_TABLET | Freq: Three times a day (TID) | ORAL | 0 refills | Status: AC | PRN

## 2019-04-25 NOTE — Patient Instructions (Signed)

## 2019-04-25 NOTE — Progress Notes (Signed)
Subjective:  Patient ID: George Watson, male    DOB: 1946/04/28  Age: 73 y.o. MRN: [id number]  CC: Chest Pain   HPI George Watson presents for f/up - He complains of a 1 day history of left lateral mid chest wall pain after exercising.  He describes the pain as pleuritic in nature.  He has an unchanged level of shortness of breath but he denies cough, hemoptysis, diaphoresis, fever, chills, dizziness, lightheadedness, or near syncope.  Outpatient Medications Prior to Visit  Medication Sig Dispense Refill  . albuterol (VENTOLIN HFA) 108 (90 Base) MCG/ACT inhaler INHALE 2 PUFFS INTO THE LUNGS EVERY 6 (SIX) HOURS AS NEEDED FOR SHORTNESS OF BREATH. 18 g 1  . ANORO ELLIPTA 62.5-25 MCG/INH AEPB INHALE 1 PUFF BY MOUTH EVERY DAY 120 each 1  . dutasteride (AVODART) 0.5 MG capsule Take 0.5 mg by mouth daily.    . fluticasone (FLOVENT HFA) 110 MCG/ACT inhaler Inhale 1 puff into the lungs 2 (two) times a day. 3 Inhaler 1  . glucosamine-chondroitin 500-400 MG tablet Take 3 tablets by mouth daily.    . Multiple Vitamins-Minerals (CENTRUM SILVER PO) Take by mouth.    . olmesartan (BENICAR) 40 MG tablet Take 1 tablet (40 mg total) by mouth daily. 90 tablet 1  . omega-3 acid ethyl esters (LOVAZA) 1 g capsule Take 2 capsules (2 g total) by mouth 2 (two) times daily. 360 capsule 1  . Pitavastatin Calcium (LIVALO) 2 MG TABS Take 1 tablet (2 mg total) by mouth daily. 90 tablet 1  . Roflumilast (DALIRESP) 250 MCG TABS Take 1 tablet by mouth daily. 90 tablet 1  . sildenafil (VIAGRA) 100 MG tablet continuously as needed.    . tamsulosin (FLOMAX) 0.4 MG CAPS capsule Take 0.4 mg by mouth daily.     . predniSONE (DELTASONE) 50 MG tablet Take 1 tablet (50 mg total) by mouth daily with breakfast. 5 tablet 0   No facility-administered medications prior to visit.     ROS Review of Systems  Constitutional: Negative for diaphoresis, fatigue and unexpected weight change.  Eyes: Negative.   Respiratory: Positive  for shortness of breath. Negative for cough, chest tightness, wheezing and stridor.   Cardiovascular: Positive for chest pain. Negative for palpitations and leg swelling.  Gastrointestinal: Negative for abdominal pain, constipation, diarrhea, nausea and vomiting.  Endocrine: Negative.   Genitourinary: Negative.  Negative for difficulty urinating and hematuria.  Musculoskeletal: Negative for arthralgias and myalgias.  Skin: Negative.  Negative for color change and rash.  Neurological: Negative.  Negative for dizziness, syncope, weakness and light-headedness.  Hematological: Negative for adenopathy. Does not bruise/bleed easily.  Psychiatric/Behavioral: Negative.     Objective:  BP 140/84 (BP Location: Left Arm, Patient Position: Sitting, Cuff Size: Normal)   Pulse 80   Temp 98.4 F (36.9 C) (Oral)   Resp 16   Ht 5\' 11"  (1.803 m)   Wt 172 lb 8 oz (78.2 kg)   SpO2 94%   BMI 24.06 kg/m   BP Readings from Last 3 Encounters:  04/25/19 140/84  01/20/19 (!) 142/74  01/18/19 138/79    Wt Readings from Last 3 Encounters:  04/25/19 172 lb 8 oz (78.2 kg)  01/20/19 174 lb (78.9 kg)  10/04/18 181 lb 6.4 oz (82.3 kg)    Physical Exam Vitals signs reviewed.  Constitutional:      General: He is not in acute distress.    Appearance: He is well-developed. He is not ill-appearing, toxic-appearing or diaphoretic.  HENT:     Nose: Nose normal.     Mouth/Throat:     Mouth: Mucous membranes are moist.  Eyes:     General: No scleral icterus.    Conjunctiva/sclera: Conjunctivae normal.  Neck:     Musculoskeletal: Normal range of motion and neck supple.     Thyroid: No thyromegaly.  Cardiovascular:     Rate and Rhythm: Normal rate and regular rhythm.     Heart sounds: No murmur. No friction rub.  Pulmonary:     Effort: Pulmonary effort is normal. No tachypnea.     Breath sounds: No stridor or decreased air movement. Examination of the right-lower field reveals wheezing. Examination of  the left-lower field reveals wheezing and rhonchi. Wheezing and rhonchi present. No decreased breath sounds or rales.  Chest:     Chest wall: No mass, deformity, swelling, tenderness or crepitus.  Abdominal:     General: Abdomen is flat.     Palpations: There is no mass.     Tenderness: There is no abdominal tenderness.  Lymphadenopathy:     Upper Body:     Right upper body: No supraclavicular, axillary or pectoral adenopathy.     Left upper body: No supraclavicular, axillary or pectoral adenopathy.  Neurological:     Mental Status: He is alert.     Lab Results  Component Value Date   WBC 9.9 04/25/2019   HGB 15.2 04/25/2019   HCT 44.5 04/25/2019   PLT 268.0 04/25/2019   GLUCOSE 91 04/25/2019   CHOL 223 (H) 01/20/2019   TRIG 228.0 (H) 01/20/2019   HDL 32.80 (L) 01/20/2019   LDLDIRECT 133.0 01/20/2019   LDLCALC 91 01/06/2017   ALT 22 01/06/2017   AST 19 01/06/2017   NA 140 04/25/2019   K 4.0 04/25/2019   CL 106 04/25/2019   CREATININE 0.88 04/25/2019   BUN 20 04/25/2019   CO2 26 04/25/2019   TSH 0.86 01/20/2019   PSA 1.2 01/06/2019   HGBA1C 6.3 04/25/2019   MICROALBUR 0.7 07/26/2015    Dg Chest 2 View  Result Date: 04/25/2019 CLINICAL DATA:  Pleuritic left chest pain and shortness of breath. EXAM: CHEST - 2 VIEW COMPARISON:  10/04/2018 FINDINGS: The heart size and mediastinal contours are within normal limits. Both lungs are clear. The visualized skeletal structures are sign from soft tissue anchors in the right humeral head. IMPRESSION: No active cardiopulmonary disease. Electronically Signed   By: Zetta Bills M.D.   On: 04/25/2019 16:26    Assessment & Plan:   George Watson was seen today for chest pain.  Diagnoses and all orders for this visit:  Prediabetes- His A1c is stable at 6.3%.  Medical therapy is not indicated. -     Basic metabolic panel; Future -     Hemoglobin A1c; Future  Pleuritic chest pain- Exam, plain films, and normal d-dimer are reassuring that  he does not have a pulmonary embolus, pneumothorax, pneumonia, or effusion.  I will treat him for viral pleurisy. -     CBC with Differential/Platelet; Future -     D-dimer, quantitative (not at [facility]); Future -     DG Chest 2 View; Future  Pleurisy without effusion -     ibuprofen (ADVIL) 600 MG tablet; Take 1 tablet (600 mg total) by mouth every 8 (eight) hours as needed for up to 10 days for mild pain.   I have discontinued Jaxan P. Jollie's predniSONE. I am also having him start on ibuprofen. Additionally, I am  having him maintain his dutasteride, Multiple Vitamins-Minerals (CENTRUM SILVER PO), sildenafil, glucosamine-chondroitin, tamsulosin, fluticasone, Daliresp, olmesartan, Livalo, albuterol, omega-3 acid ethyl esters, and Anoro Ellipta.  Meds ordered this encounter  Medications  . ibuprofen (ADVIL) 600 MG tablet    Sig: Take 1 tablet (600 mg total) by mouth every 8 (eight) hours as needed for up to 10 days for mild pain.    Dispense:  30 tablet    Refill:  0     Follow-up: Return if symptoms worsen or fail to improve.  Scarlette Calico, MD

## 2019-04-26 ENCOUNTER — Telehealth: Payer: Self-pay | Admitting: Family Medicine

## 2019-04-26 ENCOUNTER — Encounter: Payer: Self-pay | Admitting: Internal Medicine

## 2019-04-26 DIAGNOSIS — S29012A Strain of muscle and tendon of back wall of thorax, initial encounter: Secondary | ICD-10-CM | POA: Diagnosis not present

## 2019-04-26 DIAGNOSIS — M47817 Spondylosis without myelopathy or radiculopathy, lumbosacral region: Secondary | ICD-10-CM | POA: Diagnosis not present

## 2019-04-26 DIAGNOSIS — M47812 Spondylosis without myelopathy or radiculopathy, cervical region: Secondary | ICD-10-CM | POA: Diagnosis not present

## 2019-04-26 DIAGNOSIS — M9901 Segmental and somatic dysfunction of cervical region: Secondary | ICD-10-CM | POA: Diagnosis not present

## 2019-04-26 DIAGNOSIS — M9902 Segmental and somatic dysfunction of thoracic region: Secondary | ICD-10-CM | POA: Diagnosis not present

## 2019-04-26 DIAGNOSIS — M9903 Segmental and somatic dysfunction of lumbar region: Secondary | ICD-10-CM | POA: Diagnosis not present

## 2019-04-26 LAB — HEMOGLOBIN A1C: Hgb A1c MFr Bld: 6.3 % (ref 4.6–6.5)

## 2019-04-26 NOTE — Telephone Encounter (Signed)
Patient is requesting to transfer care from Dr Ronnald Ramp at [facility] to Dr Rogers Blocker at [facility] due to location.  Dr Ronnald Ramp, is this okay with you? Dr. Rogers Blocker, is this okay with you?

## 2019-04-26 NOTE — Telephone Encounter (Signed)
Copied from [place] [id number]. Topic: Appointment Scheduling - Scheduling Inquiry for Clinic >> Apr 26, 2019 12:59 PM Scherrie Gerlach wrote: Reason for CRM:  pt states he lives within 1 mile of the [facility] location and wants to switch to Dr Rogers Blocker from Dr Ronnald Ramp.  Is that ok?

## 2019-04-26 NOTE — Telephone Encounter (Signed)
Yes, I am ok with this  TJ

## 2019-04-27 NOTE — Telephone Encounter (Signed)
Fine with me.  Thanks! Aw

## 2019-04-27 NOTE — Telephone Encounter (Signed)
Please schedule

## 2019-04-27 NOTE — Telephone Encounter (Signed)
Can you help to get him scheduled at your office?

## 2019-04-27 NOTE — Telephone Encounter (Signed)
error 

## 2019-04-29 DIAGNOSIS — M21961 Unspecified acquired deformity of right lower leg: Secondary | ICD-10-CM | POA: Diagnosis not present

## 2019-04-29 DIAGNOSIS — R609 Edema, unspecified: Secondary | ICD-10-CM | POA: Diagnosis not present

## 2019-05-02 ENCOUNTER — Encounter: Payer: Self-pay | Admitting: Internal Medicine

## 2019-05-03 DIAGNOSIS — Z961 Presence of intraocular lens: Secondary | ICD-10-CM | POA: Diagnosis not present

## 2019-05-10 ENCOUNTER — Encounter: Payer: Self-pay | Admitting: Internal Medicine

## 2019-05-10 NOTE — Progress Notes (Signed)
Virtual Visit via Video Note  I connected with George Watson on 05/11/19 at  9:45 AM EDT by a video enabled telemedicine application and verified that I am speaking with the correct person using two identifiers.   I discussed the limitations of evaluation and management by telemedicine and the availability of in person appointments. The patient expressed understanding and agreed to proceed.  The patient is currently at home and I am in the office.    No referring provider.    History of Present Illness: This is an acute visit for difficulty breathing.  He saw Dr Ronnald Ramp several weeks ago - he pulled muscles in his rib cage.  He had some mild breathing issues related to that and was concerned about the possibility of pneumonia.  He had an x-ray at that time to rule out infection, which was normal.  Yesterday he stared feeling heavy in the chest -it was difficult to free.  He describes it as a heaviness or tightness in the chest.  Today that symptom is not as bad as yesterday, but he still has it.  He has some wheezing at times, but denies any cough.  He denies any cold symptoms, fevers or chills.  He denies chest pain or palpitations.  He states this feeling of chest heaviness and tightness is similar to his flares of COPD in the past.  He has been working out in the yard, but is unsure what could have triggered this.  He is using his inhalers as prescribed and they do help, except for the albuterol does not seem to help too much.  This morning he was exercising on his stationary bike and he did have a brief episode of right-sided, arm and leg, weakness.  It lasted maybe 5-10 minutes.  He is unsure if it was related to exercise or something else.  He denied any other concerning symptoms at that time.  He has felt fine since then.  He has checked his blood pressure a few times today and it has been variable-once it was in the 120s, but typically in the 150s.   Review of Systems  Constitutional:  Negative for chills and fever.  HENT: Negative for congestion, ear pain, sinus pain and sore throat.   Respiratory: Positive for wheezing. Negative for cough and shortness of breath.        Chest tightness/heaviness  Cardiovascular: Negative for chest pain and palpitations.  Neurological: Positive for weakness (right arm and leg weakness this morning - lasted 10-15 min).       Little lightheaded this morning when exercising     Social History   Socioeconomic History  . Marital status: Married    Spouse name: Not on file  . Number of children: Not on file  . Years of education: Not on file  . Highest education level: Not on file  Occupational History  . Occupation: Diplomatic Services operational officer  . Financial resource strain: Not on file  . Food insecurity    Worry: Not on file    Inability: Not on file  . Transportation needs    Medical: Not on file    Non-medical: Not on file  Tobacco Use  . Smoking status: Former Smoker    Years: 43.00    Types: Cigarettes    Quit date: 07/21/2002    Years since quitting: 16.8  . Smokeless tobacco: Never Used  Substance and Sexual Activity  . Alcohol use: No  . Drug use: No  Comment: 26 years ago cocaine, heroin. methadone  . Sexual activity: Yes    Comment: number of sex partners in the last 12 months  1  Lifestyle  . Physical activity    Days per week: Not on file    Minutes per session: Not on file  . Stress: Not on file  Relationships  . Social Herbalist on phone: Not on file    Gets together: Not on file    Attends religious service: Not on file    Active member of club or organization: Not on file    Attends meetings of clubs or organizations: Not on file    Relationship status: Not on file  Other Topics Concern  . Not on file  Social History Narrative      Married to [employer]. Exercise cardio daily for 30 minutes. Education: [employer].     Observations/Objective: Appears well in NAD Breathing  normally Speaking clearly, neurologically nonfocal  Assessment and Plan:  See Problem List for Assessment and Plan of chronic medical problems.   Follow Up Instructions:    I discussed the assessment and treatment plan with the patient. The patient was provided an opportunity to ask questions and all were answered. The patient agreed with the plan and demonstrated an understanding of the instructions.   The patient was advised to call back or seek an in-person evaluation if the symptoms worsen or if the condition fails to improve as anticipated.    Binnie Rail, MD

## 2019-05-11 ENCOUNTER — Encounter: Payer: Self-pay | Admitting: Internal Medicine

## 2019-05-11 ENCOUNTER — Ambulatory Visit (INDEPENDENT_AMBULATORY_CARE_PROVIDER_SITE_OTHER): Payer: PPO | Admitting: Internal Medicine

## 2019-05-11 DIAGNOSIS — R531 Weakness: Secondary | ICD-10-CM

## 2019-05-11 DIAGNOSIS — J441 Chronic obstructive pulmonary disease with (acute) exacerbation: Secondary | ICD-10-CM | POA: Diagnosis not present

## 2019-05-11 DIAGNOSIS — I10 Essential (primary) hypertension: Secondary | ICD-10-CM | POA: Diagnosis not present

## 2019-05-11 MED ORDER — PREDNISONE 20 MG PO TABS: 40.0000 mg | ORAL_TABLET | Freq: Every day | ORAL | 0 refills | Status: AC

## 2019-05-11 NOTE — Assessment & Plan Note (Signed)
Blood pressure variable today, but seems to be on the high side at home Advised him to continue to monitor at home and discuss with PCP-may need an additional medication

## 2019-05-11 NOTE — Assessment & Plan Note (Signed)
Current chest tightness and heaviness consistent with previous COPD exacerbations No evidence of an infection He is taking his Flovent and Anoro daily, albuterol as needed-he will continue this We will do a few days of prednisone, which has been effective for him in the past If his symptoms do not resolve completely or if he develops any new symptoms he will call

## 2019-05-11 NOTE — Assessment & Plan Note (Signed)
Transient right-sided weakness this morning after exercising No other concerning neurological symptoms No previous episodes Discussed the most concerning cause would be a TIA Does not tolerate aspirin and currently not taking a statin due to side effects.  He will discuss this with his PCP for further testing and medication adjustment Advised that if this occurs again he needs to call immediately or go to the emergency room

## 2019-05-24 DIAGNOSIS — M9901 Segmental and somatic dysfunction of cervical region: Secondary | ICD-10-CM | POA: Diagnosis not present

## 2019-05-24 DIAGNOSIS — M9902 Segmental and somatic dysfunction of thoracic region: Secondary | ICD-10-CM | POA: Diagnosis not present

## 2019-05-24 DIAGNOSIS — M9903 Segmental and somatic dysfunction of lumbar region: Secondary | ICD-10-CM | POA: Diagnosis not present

## 2019-05-24 DIAGNOSIS — M47817 Spondylosis without myelopathy or radiculopathy, lumbosacral region: Secondary | ICD-10-CM | POA: Diagnosis not present

## 2019-05-24 DIAGNOSIS — S29012A Strain of muscle and tendon of back wall of thorax, initial encounter: Secondary | ICD-10-CM | POA: Diagnosis not present

## 2019-05-24 DIAGNOSIS — M47812 Spondylosis without myelopathy or radiculopathy, cervical region: Secondary | ICD-10-CM | POA: Diagnosis not present

## 2019-05-26 DIAGNOSIS — L218 Other seborrheic dermatitis: Secondary | ICD-10-CM | POA: Diagnosis not present

## 2019-05-26 DIAGNOSIS — L570 Actinic keratosis: Secondary | ICD-10-CM | POA: Diagnosis not present

## 2019-05-26 DIAGNOSIS — L821 Other seborrheic keratosis: Secondary | ICD-10-CM | POA: Diagnosis not present

## 2019-05-26 DIAGNOSIS — L814 Other melanin hyperpigmentation: Secondary | ICD-10-CM | POA: Diagnosis not present

## 2019-06-13 ENCOUNTER — Encounter: Payer: PPO | Admitting: Internal Medicine

## 2019-06-13 ENCOUNTER — Encounter: Payer: Self-pay | Admitting: Family Medicine

## 2019-06-13 ENCOUNTER — Ambulatory Visit (INDEPENDENT_AMBULATORY_CARE_PROVIDER_SITE_OTHER): Payer: PPO | Admitting: Family Medicine

## 2019-06-13 ENCOUNTER — Other Ambulatory Visit: Payer: Self-pay

## 2019-06-13 VITALS — BP 138/82 | HR 68 | Temp 98.1°F | Ht 71.0 in | Wt 175.0 lb

## 2019-06-13 DIAGNOSIS — I77810 Thoracic aortic ectasia: Secondary | ICD-10-CM | POA: Insufficient documentation

## 2019-06-13 DIAGNOSIS — R7303 Prediabetes: Secondary | ICD-10-CM | POA: Diagnosis not present

## 2019-06-13 DIAGNOSIS — I10 Essential (primary) hypertension: Secondary | ICD-10-CM

## 2019-06-13 DIAGNOSIS — E785 Hyperlipidemia, unspecified: Secondary | ICD-10-CM | POA: Diagnosis not present

## 2019-06-13 MED ORDER — ROSUVASTATIN CALCIUM 10 MG PO TABS: 10.0000 mg | ORAL_TABLET | Freq: Every day | ORAL | 3 refills | Status: DC

## 2019-06-13 NOTE — Progress Notes (Signed)
Patient: George Watson MRN: [id number] DOB: 14-Mar-1946 PCP: [name], MD     Subjective:  Chief Complaint  Patient presents with  . Establish Care  . Annual Exam    HPI: Mr. Hargraves is 73 year old male who is transferring care to me today.   Hypertension: Here for follow up of hypertension.  Currently on benicar 40mg . Takes medication as prescribed and denies any side effects. Exercise includes stationary bike 2x/day, yoga, body weight and paddling. Weight has been stable. Denies any chest pain, headaches, shortness of breath, vision changes, swelling in lower extremities.   Hyperlipidemia: hx of prediabetes, copd and hyperlipidemia/hypertriglyceridemia. He states he tried a statin, but had some muscle cramping and stopped. He is currently on lovaza. He did get livalo and did used it and tolerated it fine. Looks like he may have been on lipitor. Never been on crestor. Unsure of family history. He is one of 7 kids. One sister had a CVA/MI.   Prediabetes: last a1c was one month ago and it was 6.3. on no medication. Diet/lifestyle.   Dilated aortic root: last scanned 2017. Recommended yearly screening with CTA. Has had no other screening done.   Immunization History  Administered Date(s) Administered  . Fluad Quad(high Dose 65+) 03/29/2019  . Hepatitis A 02/18/1998, 08/21/1998  . Influenza Split 04/01/2011, 05/18/2012  . Influenza, High Dose Seasonal PF 04/23/2016, 03/17/2018  . Influenza, Seasonal, Injecte, Preservative Fre 05/01/2017  . Influenza,inj,Quad PF,6+ Mos 04/28/2013, 04/21/2014  . Influenza-Unspecified 04/21/2015  . Pneumococcal Conjugate-13 08/22/2014  . Pneumococcal Polysaccharide-23 07/21/2002, 07/08/2016  . Tdap 11/18/2005, 01/03/2015   Colonoscopy: 08/24/2017 PSA: followed by [facility]  Flu shot: utd   Review of Systems  Constitutional: Negative for chills, fatigue and fever.  HENT: Negative for congestion, dental problem, ear pain, hearing loss,  postnasal drip, rhinorrhea, sore throat and trouble swallowing.   Eyes: Positive for visual disturbance.       Had cataract surgery in 07/2018  Respiratory: Negative for cough, chest tightness and shortness of breath.   Cardiovascular: Negative for chest pain, palpitations and leg swelling.  Gastrointestinal: Negative for abdominal pain, blood in stool, diarrhea, nausea and vomiting.  Endocrine: Negative for cold intolerance, heat intolerance, polydipsia, polyphagia and polyuria.  Genitourinary: Negative for dysuria and hematuria.  Musculoskeletal: Negative for arthralgias.  Skin: Negative for rash.  Neurological: Negative for dizziness and headaches.  Psychiatric/Behavioral: Negative for dysphoric mood and sleep disturbance. The patient is not nervous/anxious.     Allergies Patient has No Known Allergies.  Past Medical History Patient  has a past medical history of BPH (benign prostatic hypertrophy), COPD (chronic obstructive pulmonary disease) ([place]), DDD (degenerative disc disease), lumbosacral, Elevated PSA, Essential hypertension, benign, Fracture of shaft of clavicle, GERD (gastroesophageal reflux disease), H/O drug abuse ([place]), Hepatitis C (10/1996), HSV-2 (herpes simplex virus 2) infection, biopsy (08/2004), Inguinal hernia, Other and unspecified hyperlipidemia, Prostate cancer ([place]), Substance abuse ([place]), and Tuberculosis.  Surgical History Patient  has a past surgical history that includes fracture ribs; Cervical spine surgery; right shoulder arthroscopy (2011); right rotator cuff (1992); left knee meniscus repair (1992); Inguinal hernia repair (07/01/2012); and Hernia repair.  Family History [name]'s family history includes Cancer in his father and sister; Leukemia in his mother.  Social History Patient  reports that he quit smoking about 16 years ago. His smoking use included cigarettes. He quit after 43.00 years of use. He has never used smokeless tobacco. He reports that he  does not drink alcohol or use  drugs.    Objective: Vitals:   06/13/19 1313  BP: 138/82  Pulse: 68  Temp: 98.1 F (36.7 C)  TempSrc: Skin  SpO2: 94%  Weight: 175 lb (79.4 kg)  Height: 5\' 11"  (1.803 m)    Body mass index is 24.41 kg/m.  Physical Exam Vitals signs reviewed.  Constitutional:      Appearance: Normal appearance. He is well-developed and normal weight.  HENT:     Head: Normocephalic and atraumatic.     Right Ear: Tympanic membrane, ear canal and external ear normal.     Left Ear: Tympanic membrane, ear canal and external ear normal.     Nose: Nose normal.  Eyes:     Extraocular Movements: Extraocular movements intact.     Conjunctiva/sclera: Conjunctivae normal.     Pupils: Pupils are equal, round, and reactive to light.  Neck:     Musculoskeletal: Normal range of motion and neck supple.     Thyroid: No thyromegaly.     Vascular: No carotid bruit.  Cardiovascular:     Rate and Rhythm: Normal rate and regular rhythm.     Pulses: Normal pulses.     Heart sounds: Normal heart sounds. No murmur.  Pulmonary:     Effort: Pulmonary effort is normal.     Breath sounds: Normal breath sounds.  Abdominal:     General: Abdomen is flat. Bowel sounds are normal. There is no distension.     Palpations: Abdomen is soft.     Tenderness: There is no abdominal tenderness.  Lymphadenopathy:     Cervical: No cervical adenopathy.  Skin:    General: Skin is warm and dry.     Findings: No rash.  Neurological:     General: No focal deficit present.     Mental Status: He is alert and oriented to person, place, and time.     Cranial Nerves: No cranial nerve deficit.     Coordination: Coordination normal.     Deep Tendon Reflexes: Reflexes normal.  Psychiatric:        Mood and Affect: Mood normal.        Behavior: Behavior normal.        Depression screen [facility] 2/9 06/13/2019 06/09/2018 01/24/2016 07/26/2015 07/09/2015  Decreased Interest 0 0 0 0 0  Down, Depressed, Hopeless  0 0 0 0 0  PHQ - 2 Score 0 0 0 0 0    Assessment/plan: 1. Essential hypertension, benign Blood pressure is to goal. Continue current anti-hypertensive medications. Refills not needed. Recommended routine exercise and healthy diet including DASH diet and mediterranean diet. Encouraged weight loss. F/u in 6 months.    2. Hyperlipidemia with target LDL less than 130 Trial of crestor and see how he does. Recommended he be on statin with ascvd risk of over 30%. May do trial of every other day. Will need close monitoring of his liver as well with hx of hep C, although in remission. Will repeat liver enzymes in 3 months. Discussed side effects of statin and he is to let me know.   3. Prediabetes Just had labs done last month. Has been steady and to goal for age. Will f/u in 4-5 months for routine repeat 6 months lab work. Continue healthy and active lifestyle.   4. Dilated aortic root (HCC) Due to cost will send to vascular surgery for recommendation on CTA. Dilation was 4.1cm at it's widest form 2017 with recommended routine screening. None since that time.   - Ambulatory referral  to Vascular Surgery   Return in about 4 months (around 10/11/2019) for f/u and fasting labs. .  This visit occurred during the SARS-CoV-2 public health emergency.  Safety protocols were in place, including screening questions prior to the visit, additional usage of staff PPE, and extensive cleaning of exam room while observing appropriate contact time as indicated for disinfecting solutions.   [name], MD [facility]  06/13/2019

## 2019-06-15 ENCOUNTER — Other Ambulatory Visit: Payer: Self-pay

## 2019-06-17 ENCOUNTER — Other Ambulatory Visit: Payer: Self-pay | Admitting: Family Medicine

## 2019-06-17 DIAGNOSIS — I77810 Thoracic aortic ectasia: Secondary | ICD-10-CM

## 2019-06-17 NOTE — Progress Notes (Signed)
Hey stephanie, they emailed and said to send him here instead of vascular so I put in new referral.  Thanks!  aw

## 2019-06-21 DIAGNOSIS — M9901 Segmental and somatic dysfunction of cervical region: Secondary | ICD-10-CM | POA: Diagnosis not present

## 2019-06-21 DIAGNOSIS — M47817 Spondylosis without myelopathy or radiculopathy, lumbosacral region: Secondary | ICD-10-CM | POA: Diagnosis not present

## 2019-06-21 DIAGNOSIS — S29012A Strain of muscle and tendon of back wall of thorax, initial encounter: Secondary | ICD-10-CM | POA: Diagnosis not present

## 2019-06-21 DIAGNOSIS — M9902 Segmental and somatic dysfunction of thoracic region: Secondary | ICD-10-CM | POA: Diagnosis not present

## 2019-06-21 DIAGNOSIS — M9903 Segmental and somatic dysfunction of lumbar region: Secondary | ICD-10-CM | POA: Diagnosis not present

## 2019-06-21 DIAGNOSIS — M47812 Spondylosis without myelopathy or radiculopathy, cervical region: Secondary | ICD-10-CM | POA: Diagnosis not present

## 2019-06-27 ENCOUNTER — Other Ambulatory Visit: Payer: Self-pay | Admitting: Family Medicine

## 2019-06-27 ENCOUNTER — Encounter: Payer: Self-pay | Admitting: Family Medicine

## 2019-06-27 DIAGNOSIS — I77810 Thoracic aortic ectasia: Secondary | ICD-10-CM

## 2019-07-07 ENCOUNTER — Encounter (HOSPITAL_COMMUNITY): Payer: Self-pay | Admitting: Emergency Medicine

## 2019-07-07 ENCOUNTER — Observation Stay (HOSPITAL_COMMUNITY): Payer: PPO

## 2019-07-07 ENCOUNTER — Other Ambulatory Visit: Payer: Self-pay

## 2019-07-07 ENCOUNTER — Observation Stay (HOSPITAL_COMMUNITY)
Admission: EM | Admit: 2019-07-07 | Discharge: 2019-07-08 | Disposition: A | Discharge status: Home / Self Care | Payer: PPO | Attending: Internal Medicine | Admitting: Internal Medicine

## 2019-07-07 ENCOUNTER — Emergency Department (HOSPITAL_COMMUNITY): Payer: PPO

## 2019-07-07 ENCOUNTER — Observation Stay (HOSPITAL_BASED_OUTPATIENT_CLINIC_OR_DEPARTMENT_OTHER): Payer: PPO

## 2019-07-07 DIAGNOSIS — F1911 Other psychoactive substance abuse, in remission: Secondary | ICD-10-CM | POA: Insufficient documentation

## 2019-07-07 DIAGNOSIS — I6501 Occlusion and stenosis of right vertebral artery: Secondary | ICD-10-CM | POA: Diagnosis not present

## 2019-07-07 DIAGNOSIS — Z7982 Long term (current) use of aspirin: Secondary | ICD-10-CM | POA: Insufficient documentation

## 2019-07-07 DIAGNOSIS — Z79899 Other long term (current) drug therapy: Secondary | ICD-10-CM | POA: Diagnosis not present

## 2019-07-07 DIAGNOSIS — R29818 Other symptoms and signs involving the nervous system: Secondary | ICD-10-CM | POA: Diagnosis not present

## 2019-07-07 DIAGNOSIS — E785 Hyperlipidemia, unspecified: Secondary | ICD-10-CM | POA: Diagnosis not present

## 2019-07-07 DIAGNOSIS — I119 Hypertensive heart disease without heart failure: Secondary | ICD-10-CM | POA: Diagnosis not present

## 2019-07-07 DIAGNOSIS — J449 Chronic obstructive pulmonary disease, unspecified: Secondary | ICD-10-CM | POA: Diagnosis not present

## 2019-07-07 DIAGNOSIS — I6523 Occlusion and stenosis of bilateral carotid arteries: Secondary | ICD-10-CM | POA: Diagnosis not present

## 2019-07-07 DIAGNOSIS — B182 Chronic viral hepatitis C: Secondary | ICD-10-CM | POA: Insufficient documentation

## 2019-07-07 DIAGNOSIS — N400 Enlarged prostate without lower urinary tract symptoms: Secondary | ICD-10-CM | POA: Insufficient documentation

## 2019-07-07 DIAGNOSIS — Z8619 Personal history of other infectious and parasitic diseases: Secondary | ICD-10-CM | POA: Diagnosis present

## 2019-07-07 DIAGNOSIS — R531 Weakness: Secondary | ICD-10-CM | POA: Diagnosis not present

## 2019-07-07 DIAGNOSIS — Z87891 Personal history of nicotine dependence: Secondary | ICD-10-CM | POA: Insufficient documentation

## 2019-07-07 DIAGNOSIS — I700 Atherosclerosis of aorta: Secondary | ICD-10-CM | POA: Diagnosis not present

## 2019-07-07 DIAGNOSIS — Z20828 Contact with and (suspected) exposure to other viral communicable diseases: Secondary | ICD-10-CM | POA: Insufficient documentation

## 2019-07-07 DIAGNOSIS — Z7902 Long term (current) use of antithrombotics/antiplatelets: Secondary | ICD-10-CM | POA: Diagnosis not present

## 2019-07-07 DIAGNOSIS — K219 Gastro-esophageal reflux disease without esophagitis: Secondary | ICD-10-CM | POA: Insufficient documentation

## 2019-07-07 DIAGNOSIS — G459 Transient cerebral ischemic attack, unspecified: Secondary | ICD-10-CM

## 2019-07-07 DIAGNOSIS — Z809 Family history of malignant neoplasm, unspecified: Secondary | ICD-10-CM | POA: Diagnosis not present

## 2019-07-07 DIAGNOSIS — R0902 Hypoxemia: Secondary | ICD-10-CM | POA: Diagnosis not present

## 2019-07-07 DIAGNOSIS — Z806 Family history of leukemia: Secondary | ICD-10-CM | POA: Diagnosis not present

## 2019-07-07 DIAGNOSIS — C61 Malignant neoplasm of prostate: Secondary | ICD-10-CM | POA: Diagnosis not present

## 2019-07-07 DIAGNOSIS — Z803 Family history of malignant neoplasm of breast: Secondary | ICD-10-CM | POA: Insufficient documentation

## 2019-07-07 DIAGNOSIS — I63512 Cerebral infarction due to unspecified occlusion or stenosis of left middle cerebral artery: Secondary | ICD-10-CM

## 2019-07-07 DIAGNOSIS — E7800 Pure hypercholesterolemia, unspecified: Secondary | ICD-10-CM | POA: Insufficient documentation

## 2019-07-07 DIAGNOSIS — I6602 Occlusion and stenosis of left middle cerebral artery: Secondary | ICD-10-CM | POA: Diagnosis not present

## 2019-07-07 DIAGNOSIS — I10 Essential (primary) hypertension: Secondary | ICD-10-CM | POA: Diagnosis not present

## 2019-07-07 DIAGNOSIS — Z03818 Encounter for observation for suspected exposure to other biological agents ruled out: Secondary | ICD-10-CM | POA: Diagnosis not present

## 2019-07-07 LAB — CBC
HCT: 48.8 % (ref 39.0–52.0)
Hemoglobin: 16 g/dL (ref 13.0–17.0)
MCH: 30.9 pg (ref 26.0–34.0)
MCHC: 32.8 g/dL (ref 30.0–36.0)
MCV: 94.4 fL (ref 80.0–100.0)
Platelets: 278 10*3/uL (ref 150–400)
RBC: 5.17 MIL/uL (ref 4.22–5.81)
RDW: 12.2 % (ref 11.5–15.5)
WBC: 9.2 10*3/uL (ref 4.0–10.5)
nRBC: 0 % (ref 0.0–0.2)

## 2019-07-07 LAB — COMPREHENSIVE METABOLIC PANEL
ALT: 24 U/L (ref 0–44)
AST: 23 U/L (ref 15–41)
Albumin: 4.3 g/dL (ref 3.5–5.0)
Alkaline Phosphatase: 53 U/L (ref 38–126)
Anion gap: 12 (ref 5–15)
BUN: 24 mg/dL — ABNORMAL HIGH (ref 8–23)
CO2: 21 mmol/L — ABNORMAL LOW (ref 22–32)
Calcium: 10 mg/dL (ref 8.9–10.3)
Chloride: 106 mmol/L (ref 98–111)
Creatinine, Ser: 0.82 mg/dL (ref 0.61–1.24)
GFR calc Af Amer: >60 mL/min (ref >60)
GFR calc non Af Amer: >60 mL/min (ref >60)
Glucose, Bld: 121 mg/dL — ABNORMAL HIGH (ref 70–99)
Potassium: 4 mmol/L (ref 3.5–5.1)
Sodium: 139 mmol/L (ref 135–145)
Total Bilirubin: 0.7 mg/dL (ref 0.3–1.2)
Total Protein: 7.3 g/dL (ref 6.5–8.1)

## 2019-07-07 LAB — URINALYSIS, ROUTINE W REFLEX MICROSCOPIC
Bilirubin Urine: NEGATIVE
Glucose, UA: NEGATIVE mg/dL
Hgb urine dipstick: NEGATIVE
Ketones, ur: NEGATIVE mg/dL
Leukocytes,Ua: NEGATIVE
Nitrite: NEGATIVE
Protein, ur: NEGATIVE mg/dL
Specific Gravity, Urine: 1.03 (ref 1.005–1.030)
pH: 7 (ref 5.0–8.0)

## 2019-07-07 LAB — I-STAT CHEM 8, ED
BUN: 26 mg/dL — ABNORMAL HIGH (ref 8–23)
Calcium, Ion: 1.11 mmol/L — ABNORMAL LOW (ref 1.15–1.40)
Chloride: 109 mmol/L (ref 98–111)
Creatinine, Ser: 0.7 mg/dL (ref 0.61–1.24)
Glucose, Bld: 118 mg/dL — ABNORMAL HIGH (ref 70–99)
HCT: 47 % (ref 39.0–52.0)
Hemoglobin: 16 g/dL (ref 13.0–17.0)
Potassium: 3.9 mmol/L (ref 3.5–5.1)
Sodium: 140 mmol/L (ref 135–145)
TCO2: 22 mmol/L (ref 22–32)

## 2019-07-07 LAB — DIFFERENTIAL
Abs Immature Granulocytes: 0.02 10*3/uL (ref 0.00–0.07)
Basophils Absolute: 0.1 10*3/uL (ref 0.0–0.1)
Basophils Relative: 1 %
Eosinophils Absolute: 0.7 10*3/uL — ABNORMAL HIGH (ref 0.0–0.5)
Eosinophils Relative: 8 %
Immature Granulocytes: 0 %
Lymphocytes Relative: 36 %
Lymphs Abs: 3.3 10*3/uL (ref 0.7–4.0)
Monocytes Absolute: 1.3 10*3/uL — ABNORMAL HIGH (ref 0.1–1.0)
Monocytes Relative: 14 %
Neutro Abs: 3.7 10*3/uL (ref 1.7–7.7)
Neutrophils Relative %: 41 %

## 2019-07-07 LAB — LIPID PANEL
Cholesterol: 195 mg/dL (ref 0–200)
HDL: 34 mg/dL — ABNORMAL LOW (ref >40)
LDL Cholesterol: 125 mg/dL — ABNORMAL HIGH (ref 0–99)
Total CHOL/HDL Ratio: 5.7 RATIO
Triglycerides: 181 mg/dL — ABNORMAL HIGH (ref <150)
VLDL: 36 mg/dL (ref 0–40)

## 2019-07-07 LAB — HEMOGLOBIN A1C
Hgb A1c MFr Bld: 6.5 % — ABNORMAL HIGH (ref 4.8–5.6)
Mean Plasma Glucose: 139.85 mg/dL

## 2019-07-07 LAB — SARS CORONAVIRUS 2 (TAT 6-24 HRS): SARS Coronavirus 2: NEGATIVE

## 2019-07-07 LAB — PROTIME-INR
INR: 1 (ref 0.8–1.2)
Prothrombin Time: 13.4 seconds (ref 11.4–15.2)

## 2019-07-07 LAB — RAPID URINE DRUG SCREEN, HOSP PERFORMED
Amphetamines: NOT DETECTED
Barbiturates: NOT DETECTED
Benzodiazepines: NOT DETECTED
Cocaine: NOT DETECTED
Opiates: NOT DETECTED
Tetrahydrocannabinol: NOT DETECTED

## 2019-07-07 LAB — ECHOCARDIOGRAM COMPLETE

## 2019-07-07 LAB — APTT: aPTT: 28 seconds (ref 24–36)

## 2019-07-07 LAB — POC SARS CORONAVIRUS 2 AG -  ED: SARS Coronavirus 2 Ag: NEGATIVE

## 2019-07-07 LAB — CBG MONITORING, ED: Glucose-Capillary: 119 mg/dL — ABNORMAL HIGH (ref 70–99)

## 2019-07-07 MED ORDER — ATORVASTATIN CALCIUM 40 MG PO TABS: 40.0000 mg | ORAL_TABLET | Freq: Every day | ORAL | Status: DC

## 2019-07-07 MED ORDER — DUTASTERIDE 0.5 MG PO CAPS
0.5000 mg | ORAL_CAPSULE | Freq: Every day | ORAL | Status: DC
  Administered 2019-07-08: 0.5 mg via ORAL
  Filled 2019-07-07 (×2): qty 1

## 2019-07-07 MED ORDER — SODIUM CHLORIDE 0.9 % IV SOLN: Freq: Once | INTRAVENOUS | Status: AC

## 2019-07-07 MED ORDER — FAMOTIDINE 20 MG PO TABS
20.0000 mg | ORAL_TABLET | Freq: Every evening | ORAL | Status: DC
  Administered 2019-07-07: 20 mg via ORAL
  Filled 2019-07-07: qty 1

## 2019-07-07 MED ORDER — STROKE: EARLY STAGES OF RECOVERY BOOK: Freq: Once | Status: DC

## 2019-07-07 MED ORDER — ROSUVASTATIN CALCIUM 20 MG PO TABS
40.0000 mg | ORAL_TABLET | Freq: Once | ORAL | Status: AC
  Administered 2019-07-07: 40 mg via ORAL
  Filled 2019-07-07 (×2): qty 2

## 2019-07-07 MED ORDER — DIPHENHYDRAMINE HCL 25 MG PO CAPS
50.0000 mg | ORAL_CAPSULE | Freq: Once | ORAL | Status: AC
  Administered 2019-07-07: 50 mg via ORAL
  Filled 2019-07-07: qty 2

## 2019-07-07 MED ORDER — CLOPIDOGREL BISULFATE 75 MG PO TABS
525.0000 mg | ORAL_TABLET | Freq: Once | ORAL | Status: AC
  Administered 2019-07-07: 525 mg via ORAL
  Filled 2019-07-07: qty 7

## 2019-07-07 MED ORDER — ATORVASTATIN CALCIUM 80 MG PO TABS: 80.0000 mg | ORAL_TABLET | Freq: Every day | ORAL | Status: DC

## 2019-07-07 MED ORDER — ACETAMINOPHEN 325 MG PO TABS
650.0000 mg | ORAL_TABLET | ORAL | Status: DC | PRN
  Administered 2019-07-07: 650 mg via ORAL
  Filled 2019-07-07: qty 2

## 2019-07-07 MED ORDER — ROSUVASTATIN CALCIUM 20 MG PO TABS: 20.0000 mg | ORAL_TABLET | Freq: Every day | ORAL | Status: DC

## 2019-07-07 MED ORDER — IOHEXOL 350 MG/ML SOLN
75.0000 mL | Freq: Once | INTRAVENOUS | Status: AC | PRN
  Administered 2019-07-07: 75 mL via INTRAVENOUS

## 2019-07-07 MED ORDER — ACETAMINOPHEN 160 MG/5ML PO SOLN: 650.0000 mg | ORAL | Status: DC | PRN

## 2019-07-07 MED ORDER — UMECLIDINIUM-VILANTEROL 62.5-25 MCG/INH IN AEPB
1.0000 | INHALATION_SPRAY | Freq: Every day | RESPIRATORY_TRACT | Status: DC
  Filled 2019-07-07: qty 14

## 2019-07-07 MED ORDER — TAMSULOSIN HCL 0.4 MG PO CAPS
0.4000 mg | ORAL_CAPSULE | Freq: Every day | ORAL | Status: DC
  Administered 2019-07-07 – 2019-07-08 (×2): 0.4 mg via ORAL
  Filled 2019-07-07 (×2): qty 1

## 2019-07-07 MED ORDER — CLOPIDOGREL BISULFATE 75 MG PO TABS
75.0000 mg | ORAL_TABLET | Freq: Every day | ORAL | Status: DC
  Administered 2019-07-07 – 2019-07-08 (×2): 75 mg via ORAL
  Filled 2019-07-07 (×2): qty 1

## 2019-07-07 MED ORDER — ASPIRIN 300 MG RE SUPP: 300.0000 mg | Freq: Every day | RECTAL | Status: DC

## 2019-07-07 MED ORDER — ACETAMINOPHEN 650 MG RE SUPP: 650.0000 mg | RECTAL | Status: DC | PRN

## 2019-07-07 MED ORDER — SENNOSIDES-DOCUSATE SODIUM 8.6-50 MG PO TABS: 1.0000 | ORAL_TABLET | Freq: Every evening | ORAL | Status: DC | PRN

## 2019-07-07 MED ORDER — ROSUVASTATIN CALCIUM 20 MG PO TABS
20.0000 mg | ORAL_TABLET | Freq: Every day | ORAL | Status: DC
  Filled 2019-07-07: qty 1

## 2019-07-07 MED ORDER — ASPIRIN 325 MG PO TABS
325.0000 mg | ORAL_TABLET | Freq: Every day | ORAL | Status: DC
  Administered 2019-07-07: 325 mg via ORAL
  Filled 2019-07-07: qty 1

## 2019-07-07 MED ORDER — ENOXAPARIN SODIUM 40 MG/0.4ML ~~LOC~~ SOLN
40.0000 mg | SUBCUTANEOUS | Status: DC
  Administered 2019-07-07: 40 mg via SUBCUTANEOUS
  Filled 2019-07-07 (×2): qty 0.4

## 2019-07-07 MED ORDER — IRBESARTAN 300 MG PO TABS
300.0000 mg | ORAL_TABLET | Freq: Every day | ORAL | Status: DC
  Administered 2019-07-07 – 2019-07-08 (×2): 300 mg via ORAL
  Filled 2019-07-07 (×2): qty 1

## 2019-07-07 MED ORDER — ASPIRIN EC 81 MG PO TBEC
81.0000 mg | DELAYED_RELEASE_TABLET | Freq: Every day | ORAL | Status: DC
  Administered 2019-07-08: 81 mg via ORAL
  Filled 2019-07-07: qty 1

## 2019-07-07 MED ORDER — HYDROCORTISONE 0.5 % EX CREA
1.0000 "application " | TOPICAL_CREAM | Freq: Every day | CUTANEOUS | Status: DC | PRN
  Filled 2019-07-07: qty 28.35

## 2019-07-07 NOTE — ED Notes (Signed)
Lunch tray ordered 

## 2019-07-07 NOTE — ED Notes (Signed)
[facility] called to page out a code stroke

## 2019-07-07 NOTE — ED Notes (Addendum)
ED TO INPATIENT HANDOFF REPORT  ED Nurse  Phone #:  [phone]  S Name/Age/Gender George Watson 73 y.o. male Room/Bed: RESUSC/RESUSC  Code Status   Code Status: Not on file  Home/SNF/Other Home {Patient oriented x4 Is this baseline ? yes  Triage Complete: Triage complete  Chief Complaint weakness  Triage Note Patient arrived with EMS from home , went to bed at 11 pm last night , woke up at 3 am with intermittent right arm weakness , speech clear / no facial asymmetry , equal grips with no arm drift. Code stroke initiated .     Allergies No Known Allergies  Level of Care/Admitting Diagnosis ED Disposition    ED Disposition Condition Comment   Admit  The patient appears reasonably stabilized for admission considering the current resources, flow, and capabilities available in the ED at this time, and I doubt any other [facility] requiring further screening and/or treatment in the ED prior to admission is  present.       B Medical/Surgery History Past Medical History:  Diagnosis Date  . BPH (benign prostatic hypertrophy)   . COPD (chronic obstructive pulmonary disease) ([place])   . DDD (degenerative disc disease), lumbosacral   . Elevated PSA   . Essential hypertension, benign    diet controlled  . Fracture of shaft of clavicle   . GERD (gastroesophageal reflux disease)   . H/O drug abuse ([place])    multisubstance  . Hepatitis C 10/1996  . HSV-2 (herpes simplex virus 2) infection   . Hx of biopsy 08/2004   liver  . Inguinal hernia    right  . Other and unspecified hyperlipidemia   . Prostate cancer ([place])   . Substance abuse ([place])   . Tuberculosis    Past Surgical History:  Procedure Laterality Date  . CERVICAL SPINE SURGERY    . fracture ribs     3  . HERNIA REPAIR    . INGUINAL HERNIA REPAIR  07/01/2012   Procedure: HERNIA REPAIR INGUINAL ADULT;  Surgeon: Madilyn Hook, DO;  Location: [facility]S;  Service: General;  Laterality: Right;  with Mesh  . left knee meniscus  repair  1992  . right rotator cuff  1992  . right shoulder arthroscopy  2011     A IV Location/Drains/Wounds Patient Lines/Drains/Airways Status   Active Line/Drains/Airways    Name:   Placement date:   Placement time:   Site:   Days:   Peripheral IV 07/07/19 Left Forearm   07/07/19    0519    Forearm   less than 1   Incision 07/01/12 Groin Right   07/01/12    1215     2562          Intake/Output Last 24 hours No intake or output data in the 24 hours ending 07/07/19 9628  Labs/Imaging Results for orders placed or performed during the hospital encounter of 07/07/19 (from the past 48 hour(s))  CBG monitoring, ED     Status: Abnormal   Collection Time: 07/07/19  5:17 AM  Result Value Ref Range   Glucose-Capillary 119 (H) 70 - 99 mg/dL  Protime-INR     Status: None   Collection Time: 07/07/19  5:22 AM  Result Value Ref Range   Prothrombin Time 13.4 11.4 - 15.2 seconds   INR 1.0 0.8 - 1.2    Comment: (NOTE) INR goal varies based on device and disease states. Performed at [facility], [place] [address], [place], [place]  [address]   APTT     Status: None   Collection Time: 07/07/19  5:22 AM  Result Value Ref Range   aPTT 28 24 - 36 seconds    Comment: Performed at [facility], [place] [address] [place], [place] [address]  Comprehensive metabolic panel     Status: Abnormal   Collection Time: 07/07/19  5:22 AM  Result Value Ref Range   Sodium 139 135 - 145 mmol/L   Potassium 4.0 3.5 - 5.1 mmol/L   Chloride 106 98 - 111 mmol/L   CO2 21 (L) 22 - 32 mmol/L   Glucose, Bld 121 (H) 70 - 99 mg/dL   BUN 24 (H) 8 - 23 mg/dL   Creatinine, Ser 0.82 0.61 - 1.24 mg/dL   Calcium 10.0 8.9 - 10.3 mg/dL   Total Protein 7.3 6.5 - 8.1 g/dL   Albumin 4.3 3.5 - 5.0 g/dL   AST 23 15 - 41 U/L   ALT 24 0 - 44 U/L   Alkaline Phosphatase 53 38 - 126 U/L   Total Bilirubin 0.7 0.3 - 1.2 mg/dL   GFR calc non Af Amer >60 >60 mL/min   GFR calc Af Amer >60 >60 mL/min   Anion gap 12 5 - 15     Comment: Performed at [facility] [address], [place], [place] [address]  Urinalysis, Routine w reflex microscopic     Status: Abnormal   Collection Time: 07/07/19  5:22 AM  Result Value Ref Range   Color, Urine STRAW (A) YELLOW   APPearance CLEAR CLEAR   Specific Gravity, Urine 1.030 1.005 - 1.030   pH 7.0 5.0 - 8.0   Glucose, UA NEGATIVE NEGATIVE mg/dL   Hgb urine dipstick NEGATIVE NEGATIVE   Bilirubin Urine NEGATIVE NEGATIVE   Ketones, ur NEGATIVE NEGATIVE mg/dL   Protein, ur NEGATIVE NEGATIVE mg/dL   Nitrite NEGATIVE NEGATIVE   Leukocytes,Ua NEGATIVE NEGATIVE    Comment: Performed at [facility] [address] [place], [place] [address]  I-stat chem 8, ED     Status: Abnormal   Collection Time: 07/07/19  5:33 AM  Result Value Ref Range   Sodium 140 135 - 145 mmol/L   Potassium 3.9 3.5 - 5.1 mmol/L   Chloride 109 98 - 111 mmol/L   BUN 26 (H) 8 - 23 mg/dL   Creatinine, Ser 0.70 0.61 - 1.24 mg/dL   Glucose, Bld 118 (H) 70 - 99 mg/dL   Calcium, Ion 1.11 (L) 1.15 - 1.40 mmol/L   TCO2 22 22 - 32 mmol/L   Hemoglobin 16.0 13.0 - 17.0 g/dL   HCT 47.0 39.0 - 52.0 %  POC SARS Coronavirus 2 Ag-ED - Nasal Swab (BD Veritor Kit)     Status: None   Collection Time: 07/07/19  6:08 AM  Result Value Ref Range   SARS Coronavirus 2 Ag NEGATIVE NEGATIVE    Comment: (NOTE) SARS-CoV-2 antigen NOT DETECTED.  Negative results are presumptive.  Negative results do not preclude SARS-CoV-2 infection and should not be used as the sole basis for treatment or other patient management decisions, including infection  control decisions, particularly in the presence of clinical signs and  symptoms consistent with COVID-19, or in those who have been in contact with the virus.  Negative results must be combined with clinical observations, patient history, and epidemiological information. The expected result is Negative. Fact Sheet for Patients:  PodPark.tn Fact Sheet for Healthcare Providers: GiftContent.is This test is  not yet approved or cleared by the [place] and  has been authorized for detection and/or diagnosis of SARS-CoV-2 by FDA under an Emergency Use Authorization (EUA).  This EUA will remain in effect (meaning this test can be used) for the duration of  the COVID-19 de claration under Section 564(b)(1) of the Act, 21 U.S.C. section 360bbb-3(b)(1), unless the authorization is terminated or revoked sooner.    CT Code Stroke CTA Head W/WO contrast  Result Date: 07/07/2019 CLINICAL DATA:  Right arm weakness EXAM: CT ANGIOGRAPHY HEAD AND NECK TECHNIQUE: Multidetector CT imaging of the head and neck was performed using the standard protocol during bolus administration of intravenous contrast. Multiplanar CT image reconstructions and MIPs were obtained to evaluate the vascular anatomy. Carotid stenosis measurements (when applicable) are obtained utilizing NASCET criteria, using the distal internal carotid diameter as the denominator. CONTRAST:  46m OMNIPAQUE IOHEXOL 350 MG/ML SOLN COMPARISON:  Brain MRI 12/06/2013 FINDINGS: CTA NECK FINDINGS Aortic arch: Unremarkable.  Three vessel branching. Right carotid system: Mixed density atheromatous plaque at the bifurcation without flow limiting stenosis or ulceration. Left carotid system: Mild low-density atheromatous plaque at the common carotid bifurcation without stenosis or ulceration. There are additional plaques in the distal common carotid and distal left ICA which are also not flow reducing. Vertebral arteries: Proximal subclavian atherosclerosis without flow limiting stenosis. There is a severe stenosis at the proximal aspect of the non dominant right vertebral artery with relative underfilling. The dominant left vertebral artery is widely patent to the dura. Skeleton: Spinal degeneration with multilevel  anterolisthesis and C6-7 ACDF. No acute finding. Other neck: Negative Upper chest: Mild emphysema Review of the MIP images confirms the above findings CTA HEAD FINDINGS Anterior circulation: Bilateral carotid siphon atherosclerotic calcification. Notable plaque the left M1 segment with 50% stenosis as estimated on coronal MIPS. No detectable branch occlusion. Negative for aneurysm. Posterior circulation: Strong left vertebral artery dominance with less intense flow in the proximally stenotic right vertebral artery. The right vertebral artery and PICA are patent. The basilar is smooth and widely patent. Symmetric robust flow in the posterior cerebral arteries. Venous sinuses: Unremarkable in the arterial phase. Strong dominance of the left transverse sigmoid system Anatomic variants: None significant Review of the MIP images confirms the above findings IMPRESSION: 1. No emergent finding. 2. Severe stenosis of the non dominant right V1 segment with downstream underfilling. 3. Cervical carotid atherosclerosis without ulceration or flow reducing stenosis. 4. ~50% left M1 segment stenosis. Electronically Signed   By: JMonte FantasiaM.D.   On: 07/07/2019 06:10   CT Code Stroke CTA Neck W/WO contrast  Result Date: 07/07/2019 CLINICAL DATA:  Right arm weakness EXAM: CT ANGIOGRAPHY HEAD AND NECK TECHNIQUE: Multidetector CT imaging of the head and neck was performed using the standard protocol during bolus administration of intravenous contrast. Multiplanar CT image reconstructions and MIPs were obtained to evaluate the vascular anatomy. Carotid stenosis measurements (when applicable) are obtained utilizing NASCET criteria, using the distal internal carotid diameter as the denominator. CONTRAST:  758mOMNIPAQUE IOHEXOL 350 MG/ML SOLN COMPARISON:  Brain MRI 12/06/2013 FINDINGS: CTA NECK FINDINGS Aortic arch: Unremarkable.  Three vessel branching. Right carotid system: Mixed density atheromatous plaque at the bifurcation  without flow limiting stenosis or ulceration. Left carotid system: Mild low-density atheromatous plaque at the common carotid bifurcation without stenosis or ulceration. There are additional plaques in the distal common carotid and distal left ICA which are also not flow reducing. Vertebral arteries: Proximal subclavian atherosclerosis without flow  limiting stenosis. There is a severe stenosis at the proximal aspect of the non dominant right vertebral artery with relative underfilling. The dominant left vertebral artery is widely patent to the dura. Skeleton: Spinal degeneration with multilevel anterolisthesis and C6-7 ACDF. No acute finding. Other neck: Negative Upper chest: Mild emphysema Review of the MIP images confirms the above findings CTA HEAD FINDINGS Anterior circulation: Bilateral carotid siphon atherosclerotic calcification. Notable plaque the left M1 segment with 50% stenosis as estimated on coronal MIPS. No detectable branch occlusion. Negative for aneurysm. Posterior circulation: Strong left vertebral artery dominance with less intense flow in the proximally stenotic right vertebral artery. The right vertebral artery and PICA are patent. The basilar is smooth and widely patent. Symmetric robust flow in the posterior cerebral arteries. Venous sinuses: Unremarkable in the arterial phase. Strong dominance of the left transverse sigmoid system Anatomic variants: None significant Review of the MIP images confirms the above findings IMPRESSION: 1. No emergent finding. 2. Severe stenosis of the non dominant right V1 segment with downstream underfilling. 3. Cervical carotid atherosclerosis without ulceration or flow reducing stenosis. 4. ~50% left M1 segment stenosis. Electronically Signed   By: Monte Fantasia M.D.   On: 07/07/2019 06:10   CT HEAD CODE STROKE WO CONTRAST  Result Date: 07/07/2019 CLINICAL DATA:  Code stroke.  Right arm weakness EXAM: CT HEAD WITHOUT CONTRAST TECHNIQUE: Contiguous axial  images were obtained from the base of the skull through the vertex without intravenous contrast. COMPARISON:  Brain MRI 12/06/2013 FINDINGS: Brain: No evidence of acute infarction, hemorrhage, hydrocephalus, extra-axial collection or mass lesion/mass effect. Vascular: No hyperdense vessel or unexpected calcification. Skull: Normal. Negative for fracture or focal lesion. Sinuses/Orbits: No acute finding. Other: These results were communicated to Dr. Rory Percy at [place] 12/17/2020by text page via the [facility] messaging system. ASPECTS [facility] Stroke Program Early CT Score) - Ganglionic level infarction (caudate, lentiform nuclei, internal capsule, insula, M1-M3 cortex): 7 - Supraganglionic infarction (M4-M6 cortex): 3 Total score (0-10 with 10 being normal): 10 IMPRESSION: Negative head CT.  ASPECTS is 10. Electronically Signed   By: Monte Fantasia M.D.   On: 07/07/2019 05:35    Pending Labs Unresulted Labs (From admission, onward)    Start     Ordered   07/07/19 0520  SARS CORONAVIRUS 2 (TAT 6-24 HRS) Nasopharyngeal Nasopharyngeal Swab  (Tier 3 (TAT 6-24 hrs))  Once,   STAT    Question Answer Comment  Is this test for diagnosis or screening Screening   Symptomatic for COVID-19 as defined by CDC No   Hospitalized for COVID-19 No   Admitted to ICU for COVID-19 No   Previously tested for COVID-19 No   Resident in a congregate (group) care setting No   Employed in healthcare setting No      07/07/19 0521   07/07/19 0519  Ethanol  ONCE - STAT,   STAT     07/07/19 0518   07/07/19 0519  CBC  (Stroke Panel (PNL))  ONCE - STAT,   STAT     07/07/19 0518   07/07/19 0519  Differential  (Stroke Panel (PNL))  ONCE - STAT,   STAT     07/07/19 0518   07/07/19 0519  Urine rapid drug screen (hosp performed)  ONCE - STAT,   STAT     07/07/19 0518          Vitals/Pain Today's Vitals   07/07/19 0530 07/07/19 0545 07/07/19 0601 07/07/19 0622  BP: (!) 155/79 (!) 150/79  Marland Kitchen)  151/78  Pulse: (!) 59 72  68   Resp: '15 17  18  ' Temp:      TempSrc:      SpO2: 98% 97%  98%  PainSc:   0-No pain 0-No pain    Isolation Precautions Airborne and Contact precautions  Medications Medications  iohexol (OMNIPAQUE) 350 MG/ML injection 75 mL (75 mLs Intravenous Contrast Given 07/07/19 0543)    Mobility walks Low fall risk   Focused Assessments No neuro deficits at this time .   R Recommendations: See Admitting Provider Note  Report given to:   Additional Notes:

## 2019-07-07 NOTE — ED Notes (Signed)
[facility] called to cancel code strok per Dr. Rory Percy

## 2019-07-07 NOTE — Evaluation (Signed)
Physical Therapy Evaluation Patient Details Name: George Watson MRN: [id number] DOB: Nov 10, 1945 Today's Date: 07/07/2019   History of Present Illness  George Watson is a 73 y.o. male with medical history significant of hypertension, COPD/emphysema, BPH, treated hepatitis C, prostate cancer, remote tobacco use, and remote alcohol abuse.  He presents with acute onset of right upper and lower leg weakness around 3 AM this morning. MRI negative for acute changes.  Clinical Impression  Patient presents with mobility close to functional baseline.  Feel though he continues to complain of some symptoms he is not in need of skilled PT.  Encouraged management of risk factors to reduce risk of further episodes.  PT to sign off.     Follow Up Recommendations No PT follow up    Equipment Recommendations  None recommended by PT    Recommendations for Other Services       Precautions / Restrictions Precautions Precautions: None      Mobility  Bed Mobility Overal bed mobility: Independent                Transfers Overall transfer level: Independent                  Ambulation/Gait Ambulation/Gait assistance: Independent Gait Distance (Feet): 130 Feet Assistive device: None Gait Pattern/deviations: Step-through pattern;WFL(Within Functional Limits)        Stairs            Wheelchair Mobility    Modified Rankin (Stroke Patients Only) Modified Rankin (Stroke Patients Only) Pre-Morbid Rankin Score: No symptoms Modified Rankin: No significant disability     Balance Overall balance assessment: Independent             Standing balance comment: reaching to pick up someting from floor WFL, but pt reports feels harder than usual; turning in circle, looking over shoulder, attempting SLS all [facility]                 Standardized Balance Assessment Standardized Balance Assessment : Dynamic Gait Index   Dynamic Gait Index Level Surface: Normal Change in  Gait Speed: Normal Gait with Horizontal Head Turns: Normal Gait with Vertical Head Turns: Normal Gait and Pivot Turn: Normal Step Around Obstacles: Normal       Pertinent Vitals/Pain Pain Assessment: Faces Faces Pain Scale: Hurts little more Pain Location: frontal headache Pain Descriptors / Indicators: Discomfort;Headache Pain Intervention(s): Monitored during session    Home Living Family/patient expects to be discharged to:: Private residence Living Arrangements: Spouse/significant other Available Help at Discharge: Family Type of Home: House Home Access: Stairs to enter Entrance Stairs-Rails: None Entrance Stairs-Number of Steps: 2 small Home Layout: One level Home Equipment: Environmental consultant - 2 wheels;Shower seat;Cane - single point Additional Comments: knee walker (wife recently had foot surgery)    Prior Function Level of Independence: Independent         Comments: active, uses stationary bike 2x/day for 30 min each and does lot of work around the house, Comptroller        Extremity/Trunk Assessment   Upper Extremity Assessment Upper Extremity Assessment: RUE deficits/detail RUE Deficits / Details: has R rotator cuff tear per pt; assist to elevate shoulder then can hold; denies numbness/tingling, elbow/wrist/hand WFL (but maybe less strong compared to LUE)  Has intention tremor bilaterally with FNF    Lower Extremity Assessment Lower Extremity Assessment: Overall WFL for tasks assessed(RN reported some difficulty holding R LE, but not noted on my exam)  Communication   Communication: No difficulties  Cognition Arousal/Alertness: Awake/alert Behavior During Therapy: WFL for tasks assessed/performed Overall Cognitive Status: Within Functional Limits for tasks assessed                                        General Comments General comments (skin integrity, edema, etc.): Discussed stroke warning signs and risk factors and to  always seek medical attention if symptoms.  Encouraged BP control and follow up with MD for cholesterol management.    Exercises     Assessment/Plan    PT Assessment Patent does not need any further PT services  PT Problem List         PT Treatment Interventions      PT Goals (Current goals can be found in the Care Plan section)  Acute Rehab PT Goals PT Goal Formulation: All assessment and education complete, DC therapy    Frequency     Barriers to discharge        Co-evaluation               AM-PAC PT "6 Clicks" Mobility  Outcome Measure Help needed turning from your back to your side while in a flat bed without using bedrails?: None Help needed moving from lying on your back to sitting on the side of a flat bed without using bedrails?: None Help needed moving to and from a bed to a chair (including a wheelchair)?: None Help needed standing up from a chair using your arms (e.g., wheelchair or bedside chair)?: None Help needed to walk in hospital room?: None Help needed climbing 3-5 steps with a railing? : None 6 Click Score: 24    End of Session   Activity Tolerance: Patient tolerated treatment well Patient left: in bed;with call bell/phone within reach   PT Visit Diagnosis: Other abnormalities of gait and mobility (R26.89)    Time: [id number] PT Time Calculation (min) (ACUTE ONLY): 13 min   Charges:   PT Evaluation $PT Eval Low Complexity: [place], PT Acute Rehabilitation Services [phone] 07/07/2019   Reginia Naas 07/07/2019, 6:04 PM

## 2019-07-07 NOTE — ED Triage Notes (Addendum)
Patient arrived with EMS from home , went to bed at 11 pm last night , woke up at 3 am with intermittent right arm weakness , speech clear / no facial asymmetry , equal grips with no arm drift at arrival. Code stroke initiated .

## 2019-07-07 NOTE — ED Notes (Addendum)
Dr Tamala Julian at bedside. Notified him of pharm note that inhaler ordered is not available.

## 2019-07-07 NOTE — ED Notes (Addendum)
Patient returned from [place]. Dr Erlinda Hong at bedside.

## 2019-07-07 NOTE — ED Notes (Addendum)
Dr. Carloyn Jaeger at bedside evaluating patient .

## 2019-07-07 NOTE — ED Notes (Signed)
Returned from CT scan , alert and oriented , respirations unlabored , denies pain , waiting for admitting MD .

## 2019-07-07 NOTE — Progress Notes (Addendum)
Patient had been evaluated by neurology with recommendations to be on aspirin 81mg , Plavix 75 mg daily, and increase Crestor to 20 mg nightly.  Initially the plan was to discharge patient home.  However, patient had recurrence of right-sided symptoms with complaints of frontal headache at around 4:40 PM.  On physical exam patient with right upper and lower extremity 4+/5 when previously had been 5/5.  Dr. Erlinda Hong was notified and recommended repeating CT scan of the brain stat, recheck MRI brain later this evening, and EEG in a.m.  Differential includes possibility of TIA/stroke versus complex migraine versus less likely seizure.  Plan is to monitor patient overnight at this time.

## 2019-07-07 NOTE — ED Notes (Signed)
Dr. Smith at bedside.

## 2019-07-07 NOTE — Progress Notes (Addendum)
Called by Dr. Tamala Julian that pt had again right sided weakness just before discharge. I came to bedside, he stated that he was about to be discharged and then he started to feel right sided weakness again, similar to the event that brought him in. He also stated that he had a mild HA on the vertex since 1-2pm and comes and goes. He denies any hx of migraine, or any HA yesterday with episode. Otherwise, he denies any vision changes, speech changes, or seizure like activity. He is right handed, was able to text messaging his wife with cell phone use right hand, but felt a little clumsy. He is still on IVF and BP at 150-160s.    Temp:  [97.2 F (36.2 C)] 97.2 F (36.2 C) (12/17 0512) Pulse Rate:  [59-74] 71 (12/17 1615) Resp:  [12-19] 18 (12/17 1615) BP: (134-164)/(78-92) 154/90 (12/17 1615) SpO2:  [94 %-98 %] 95 % (12/17 1615)  General - Well nourished, well developed, in no apparent distress.  Ophthalmologic - fundi not visualized due to noncooperation.  Cardiovascular - Regular rhythm and rate.  Mental Status -  Level of arousal and orientation to time, place, and person were intact. Language including expression, naming, repetition, comprehension was assessed and found intact. Attention span and concentration were normal. Fund of Knowledge was assessed and was intact.  Cranial Nerves II - XII - II - Visual field intact OU. III, IV, VI - Extraocular movements intact. V - Facial sensation intact bilaterally. VII - Facial movement intact bilaterally. VIII - Hearing & vestibular intact bilaterally. X - Palate elevates symmetrically. XI - Chin turning & shoulder shrug intact bilaterally. XII - Tongue protrusion intact.  Motor Strength - The patient's strength was normal in all extremities except chronic right rotator cuff injury so the RUE proximal 3/5, however finger grip on the right strong.  Bulk was normal and fasciculations were absent.   Motor Tone - Muscle tone was assessed at the  neck and appendages and was normal.  Reflexes - The patient's reflexes were symmetrical in all extremities and he had no pathological reflexes.  Sensory - Light touch, temperature/pinprick were assessed and were symmetrical.    Coordination - The patient had normal movements in the left hand and left leg with no ataxia or dysmetria. Slight dysmetria on the right hand FTN and leg HTS (?? Anxiety). Tremor was absent.  Gait and Station - able to walk without assistance in CT room. He felt he walks fine.     CT HEAD WO CONTRAST  Result Date: 07/07/2019 CLINICAL DATA:  Acute neuro deficit, stroke suspected EXAM: CT HEAD WITHOUT CONTRAST TECHNIQUE: Contiguous axial images were obtained from the base of the skull through the vertex without intravenous contrast. COMPARISON:  Same-day MRI and CT angiography FINDINGS: Brain: No evidence of acute infarction, hemorrhage, hydrocephalus, extra-axial collection or mass lesion/mass effect. Patchy areas of white matter hypoattenuation are most compatible with chronic microvascular angiopathy. Slight prominence of the ventricles, cisterns and sulci compatible with mild frontal predominant parenchymal volume loss. Vascular: Atherosclerotic calcification of the carotid siphons and intradural vertebral arteries. No hyperdense vessel. Skull: No calvarial fracture or suspicious osseous lesion. No scalp swelling or hematoma. Sinuses/Orbits: Paranasal sinuses and mastoid air cells are predominantly clear. Orbital structures are unremarkable aside from prior lens extractions. Other: None IMPRESSION: No acute interval change from comparison CT and MRI performed 12 hours prior. Electronically Signed   By: Lovena Le M.D.   On: 07/07/2019 17:55   Assessment and  Plan:  Pt seems to have recurrent right sided mild weakness and dysmetria and getting better over time. He felt his typing with his cell phone and walking have improved. Currently too mild to treat. Discussed with him  about too mild to treat with tPA and he is in agreement. However, with the recurrent feature, I will load him with total plavix 600mg  and high dose statin stat. Will repeat MRI limited to rule out stroke and will do EEG to rule out seizure although less likely. Will need close neuro check.   - Admit for further stroke work up  - Frequent neuro checks,. Q2h x 12 hours and then Q4h - Telemetry monitoring - MRI brain - limited sequence to rule out stroke - EEG to rule out seizure - Permissive hypertension (only treat if BP > 220/120 unless a lower blood pressure is clinically necessary) for 24-48 hours post stroke onset - Continue IVF - Avoid low BP - Will load with plavix 600mg  total and then continue DAPT - Will have stat crestor 40mg  - Discussed with Dr. Tamala Julian - Will follow  Rosalin Hawking, MD PhD Stroke Neurology 07/07/2019 6:12 PM  I spent  35 minutes in total face-to-face time with the patient, more than 50% of which was spent in counseling and coordination of care, reviewing test results, images and medication, and discussing the diagnosis of recurrent stroke symptoms, treatment plan and potential prognosis. This patient's care requiresreview of multiple databases, neurological assessment, discussion with family, other specialists and medical decision making of high complexity. I discussed with Dr. Tamala Julian

## 2019-07-07 NOTE — ED Notes (Signed)
Per Dr Erlinda Hong pt to have high dose Plavix and Crestor before going to MRI. Will call MRI now that pt has been given treatment.

## 2019-07-07 NOTE — ED Notes (Signed)
Called CT to expedite CT scan if possible.

## 2019-07-07 NOTE — Progress Notes (Deleted)
EEG complete - results pending 

## 2019-07-07 NOTE — ED Notes (Signed)
Requested diet order.

## 2019-07-07 NOTE — Consult Note (Signed)
Neurology Consultation  Reason for Consult: Right arm weakness Referring Physician: Dr. Nicholes Stairs  CC: Right arm weakness  History is obtained from: Patient, chart review  HPI: George Watson is a 73 y.o. male past medical history of COPD, hypertension, polysubstance abuse in the past, hepatitis C, hypercholesterolemia, prostate cancer, usual state of health last 11 PM on 07/06/2019 when he went to bed and woke up at 3 AM today and noticed that he is not able to control his right arm well.  Symptoms lasted for about an hour.  Called EMS because he had a similar episode in October and was told by one of his pulmonary physicians to call 911 if this ever happened again. He waited about an hour before calling EMS and the symptoms subsided while he was in the ambulance. In the ER, he was noted to have some tremulousness of his right arm hence a code stroke was initiated for concern for acute weakness. He reports that the current weakness in his right arm is secondary to his torn rotator cuff and not so much of true weakness.  He was much weaker at the house he says. Denies any tingling numbness on the other side of the body.  Denies headaches.  Denies visual changes.  Denies facial drooping or speech related changes. Retired [employer].  Former smoker.  Has a history of substance abuse listed on the chart.  He has outpatient neurologist that he had seen for memory related concerns-did not really have any real memory issues but had a friend who was diagnosed with early onset dementia so he went to see the neurologist for memory testing.   LKW: 11 PM 07/06/2019 tpa given?: no, outside the window, NIH 0 Premorbid modified Rankin scale (mRS):0  ROS: Review of systems performed in detail and is negative except as noted in HPI.  Past Medical History:  Diagnosis Date  . BPH (benign prostatic hypertrophy)   . COPD (chronic obstructive pulmonary disease) ([place])   . DDD (degenerative disc disease),  lumbosacral   . Elevated PSA   . Essential hypertension, benign    diet controlled  . Fracture of shaft of clavicle   . GERD (gastroesophageal reflux disease)   . H/O drug abuse ([place])    multisubstance  . Hepatitis C 10/1996  . HSV-2 (herpes simplex virus 2) infection   . Hx of biopsy 08/2004   liver  . Inguinal hernia    right  . Other and unspecified hyperlipidemia   . Prostate cancer ([place])   . Substance abuse ([place])   . Tuberculosis     Family History  Problem Relation Age of Onset  . Cancer Sister        breast  . Leukemia Mother   . Cancer Father    Social History:   reports that he quit smoking about 16 years ago. His smoking use included cigarettes. He quit after 43.00 years of use. He has never used smokeless tobacco. He reports that he does not drink alcohol or use drugs.  Medications No current facility-administered medications for this encounter.  Current Outpatient Medications:  .  albuterol (VENTOLIN HFA) 108 (90 Base) MCG/ACT inhaler, INHALE 2 PUFFS INTO THE LUNGS EVERY 6 (SIX) HOURS AS NEEDED FOR SHORTNESS OF BREATH., Disp: 18 g, Rfl: 1 .  ANORO ELLIPTA 62.5-25 MCG/INH AEPB, INHALE 1 PUFF BY MOUTH EVERY DAY, Disp: 120 each, Rfl: 1 .  aspirin 81 MG chewable tablet, Chew by mouth daily. Currently taking MWF, Disp: , Rfl:  .  cholecalciferol (VITAMIN D3) 25 MCG (1000 UT) tablet, Take 1,000 Units by mouth daily., Disp: , Rfl:  .  Coenzyme Q10-Vitamin E 100-300 MG-UNIT CHEW, Chew by mouth., Disp: , Rfl:  .  dutasteride (AVODART) 0.5 MG capsule, Take 0.5 mg by mouth daily., Disp: , Rfl:  .  famotidine (HM FAMOTIDINE) 20 MG tablet, Take 20 mg by mouth 2 (two) times daily., Disp: , Rfl:  .  glucosamine-chondroitin 500-400 MG tablet, Take 3 tablets by mouth daily., Disp: , Rfl:  .  Multiple Vitamins-Minerals (CENTRUM SILVER PO), Take by mouth., Disp: , Rfl:  .  olmesartan (BENICAR) 40 MG tablet, Take 1 tablet (40 mg total) by mouth daily., Disp: 90 tablet, Rfl: 1 .   omega-3 acid ethyl esters (LOVAZA) 1 g capsule, Take 2 capsules (2 g total) by mouth 2 (two) times daily., Disp: 360 capsule, Rfl: 1 .  rosuvastatin (CRESTOR) 10 MG tablet, Take 1 tablet (10 mg total) by mouth daily., Disp: 90 tablet, Rfl: 3 .  tamsulosin (FLOMAX) 0.4 MG CAPS capsule, Take 0.4 mg by mouth daily. , Disp: , Rfl:   Exam: Current vital signs: BP (!) 150/79   Pulse 72   Temp (!) 97.2 F (36.2 C) (Oral)   Resp 17   SpO2 97%  Vital signs in last 24 hours: Temp:  [97.2 F (36.2 C)] 97.2 F (36.2 C) (12/17 0512) Pulse Rate:  [59-72] 72 (12/17 0545) Resp:  [15-18] 17 (12/17 0545) BP: (149-159)/(79-85) 150/79 (12/17 0545) SpO2:  [97 %-98 %] 97 % (12/17 0545)  GENERAL: Awake, alert in NAD HEENT: - Normocephalic and atraumatic, dry mm, no LN++, no Thyromegally LUNGS - Clear to auscultation bilaterally with no wheezes CV - S1S2 RRR, no m/r/g, equal pulses bilaterally. ABDOMEN - Soft, nontender, nondistended with normoactive BS Ext: warm, well perfused, intact peripheral pulses, no edema  NEURO:  Mental Status: AA&Ox3.  Slightly anxious appearing. Language: speech is nondysarthric.  Naming, repetition, fluency, and comprehension intact. Cranial Nerves: PERRL EOMI, visual fields full, no facial asymmetry,facial sensation intact, hearing intact, tongue/uvula/soft palate midline, normalsternocleidomastoid and trapezius muscle strength. No evidence of tongue atrophy or fibrillations Motor: Full strength in all 4 extremities with the exception of decreased range of motion in the right shoulder. Tone: is normal and bulk is normal.  There is mild action tremor in both upper extremities. Sensation- Intact to light touch bilaterally Coordination: FTN intact bilaterally Gait- deferred  NIHSS-0   Labs I have reviewed labs in epic and the results pertinent to this consultation are:  CBC    Component Value Date/Time   WBC 9.9 04/25/2019 1621   RBC 4.80 04/25/2019 1621   HGB 16.0  07/07/2019 0533   HCT 47.0 07/07/2019 0533   PLT 268.0 04/25/2019 1621   MCV 92.7 04/25/2019 1621   MCV 89.2 01/24/2016 0857   MCH 31.4 (A) 01/24/2016 0857   MCH 31.0 07/26/2015 0904   MCHC 34.2 04/25/2019 1621   RDW 13.0 04/25/2019 1621   LYMPHSABS 3.3 04/25/2019 1621   MONOABS 1.2 (H) 04/25/2019 1621   EOSABS 1.2 (H) 04/25/2019 1621   BASOSABS 0.1 04/25/2019 1621    CMP     Component Value Date/Time   NA 140 07/07/2019 0533   K 3.9 07/07/2019 0533   CL 109 07/07/2019 0533   CO2 26 04/25/2019 1621   GLUCOSE 118 (H) 07/07/2019 0533   BUN 26 (H) 07/07/2019 0533   BUN 27 (A) 08/06/2011 0000   CREATININE 0.70 07/07/2019 0533  CREATININE 0.84 01/24/2016 0847   CALCIUM 10.0 04/25/2019 1621   PROT 7.2 01/06/2017 0740   ALBUMIN 4.8 01/06/2017 0740   AST 19 01/06/2017 0740   ALT 22 01/06/2017 0740   ALKPHOS 57 01/06/2017 0740   BILITOT 0.5 01/06/2017 0740   GFRNONAA >89 01/24/2016 0847   GFRAA >89 01/24/2016 0847    Lipid Panel     Component Value Date/Time   CHOL 223 (H) 01/20/2019 1400   TRIG 228.0 (H) 01/20/2019 1400   HDL 32.80 (L) 01/20/2019 1400   CHOLHDL 7 01/20/2019 1400   VLDL 45.6 (H) 01/20/2019 1400   LDLCALC 91 01/06/2017 0740   LDLDIRECT 133.0 01/20/2019 1400     Imaging I have reviewed the images obtained: CT-scan of the brain-no acute changes   Assessment:  73 year old man past medical history of COPD, hypertension, polysubstance abuse in the past, hepatitis C, hypercholesterolemia, prostate cancer with sudden onset of right upper extremity weakness that lasted about an hour this morning upon waking up at 3 AM.  Last known normal 11 PM last night-outside the window for IV TPA. Also NIH stroke scale 0. Reports having had similar episode lasting less than an hour in October.  Spontaneously resolved. Reports some right shoulder rotator cuff injury and tear but the weakness was sudden onset and resolved completely. Given the risk factors, will evaluate  for stroke/TIA risk factors work-up.  Recommendations: -Admit for observation -Telemetry -Frequent neurochecks -CTA head and neck-completed-formal read pending -MRI brain without contrast -2D echo -A1c -Lipid panel -Aspirin 325 now and daily.  Is on baby aspirin every other day due to easy bleeding. -Atorvastatin 80 now and daily -PT -OT -Speech therapy -N.p.o. until cleared by bedside stroke swallow screen or formal swallow evaluation -Allow for permissive hypertension.  Treat only if systolic blood pressures are greater than 220 on a as needed basis. -Check urinary toxicology screen  Code stroke can be canceled.  Current NIH 0 and back to baseline.  If symptoms worsen, call code stroke again as his last known well clock has now reset due to NIHSS 0.  Stroke team will follow with you.  -- Amie [place], MD [facility] Pager: [phone] If 7pm to 7am, please call on call as listed on AMION.

## 2019-07-07 NOTE — ED Notes (Signed)
Patient transported to MRI 

## 2019-07-07 NOTE — ED Notes (Signed)
MRI is on the way to get the pt.

## 2019-07-07 NOTE — Progress Notes (Signed)
Echocardiogram 2D Echocardiogram has been performed.  George Watson George Watson George Watson 07/07/2019, 11:13 AM

## 2019-07-07 NOTE — ED Notes (Signed)
Dr. Carloyn Jaeger advised RN to cancel Code Stroke.

## 2019-07-07 NOTE — ED Notes (Signed)
Called bed placement to let them know we are going to need a bed as the pt is no longer going to be discharged.

## 2019-07-07 NOTE — Progress Notes (Addendum)
STROKE TEAM PROGRESS NOTE   INTERVAL HISTORY Pt lying in bed, stated that his right sided weakness resolved after 1 hour this morning. He had a similar episode on 05/12/2019 while he was doing exercises on station bike, resolved in 20 minutes. MRI negative. CT head and neck showed left M1 50% stenosis. He stated that he only takes aspirin 81 and Crestor 10 there times a week at home.  Vitals:   07/07/19 1030 07/07/19 1200 07/07/19 1240 07/07/19 1245  BP: (!) 138/92 (!) 159/85  (!) 164/90  Pulse: 74 71    Resp: 14 19  14   Temp:      TempSrc:      SpO2: 95% 96% 98%     CBC:  Recent Labs  Lab 07/07/19 0522 07/07/19 0533  WBC 9.2  --   NEUTROABS 3.7  --   HGB 16.0 16.0  HCT 48.8 47.0  MCV 94.4  --   PLT 278  --     Basic Metabolic Panel:  Recent Labs  Lab 07/07/19 0522 07/07/19 0533  NA 139 140  K 4.0 3.9  CL 106 109  CO2 21*  --   GLUCOSE 121* 118*  BUN 24* 26*  CREATININE 0.82 0.70  CALCIUM 10.0  --    Lipid Panel:     Component Value Date/Time   CHOL 223 (H) 01/20/2019 1400   TRIG 228.0 (H) 01/20/2019 1400   HDL 32.80 (L) 01/20/2019 1400   CHOLHDL 7 01/20/2019 1400   VLDL 45.6 (H) 01/20/2019 1400   LDLCALC 91 01/06/2017 0740   HgbA1c:  Lab Results  Component Value Date   HGBA1C 6.5 (H) 07/07/2019   Urine Drug Screen:     Component Value Date/Time   LABOPIA NONE DETECTED 07/07/2019 0522   COCAINSCRNUR NONE DETECTED 07/07/2019 0522   LABBENZ NONE DETECTED 07/07/2019 0522   AMPHETMU NONE DETECTED 07/07/2019 0522   THCU NONE DETECTED 07/07/2019 0522   LABBARB NONE DETECTED 07/07/2019 0522    Alcohol Level No results found for: ETH  IMAGING CT Code Stroke CTA Head W/WO contrast  Result Date: 07/07/2019 CLINICAL DATA:  Right arm weakness EXAM: CT ANGIOGRAPHY HEAD AND NECK TECHNIQUE: Multidetector CT imaging of the head and neck was performed using the standard protocol during bolus administration of intravenous contrast. Multiplanar CT image  reconstructions and MIPs were obtained to evaluate the vascular anatomy. Carotid stenosis measurements (when applicable) are obtained utilizing NASCET criteria, using the distal internal carotid diameter as the denominator. CONTRAST:  40mL OMNIPAQUE IOHEXOL 350 MG/ML SOLN COMPARISON:  Brain MRI 12/06/2013 FINDINGS: CTA NECK FINDINGS Aortic arch: Unremarkable.  Three vessel branching. Right carotid system: Mixed density atheromatous plaque at the bifurcation without flow limiting stenosis or ulceration. Left carotid system: Mild low-density atheromatous plaque at the common carotid bifurcation without stenosis or ulceration. There are additional plaques in the distal common carotid and distal left ICA which are also not flow reducing. Vertebral arteries: Proximal subclavian atherosclerosis without flow limiting stenosis. There is a severe stenosis at the proximal aspect of the non dominant right vertebral artery with relative underfilling. The dominant left vertebral artery is widely patent to the dura. Skeleton: Spinal degeneration with multilevel anterolisthesis and C6-7 ACDF. No acute finding. Other neck: Negative Upper chest: Mild emphysema Review of the MIP images confirms the above findings CTA HEAD FINDINGS Anterior circulation: Bilateral carotid siphon atherosclerotic calcification. Notable plaque the left M1 segment with 50% stenosis as estimated on coronal MIPS. No detectable branch occlusion. Negative for  aneurysm. Posterior circulation: Strong left vertebral artery dominance with less intense flow in the proximally stenotic right vertebral artery. The right vertebral artery and PICA are patent. The basilar is smooth and widely patent. Symmetric robust flow in the posterior cerebral arteries. Venous sinuses: Unremarkable in the arterial phase. Strong dominance of the left transverse sigmoid system Anatomic variants: None significant Review of the MIP images confirms the above findings IMPRESSION: 1. No  emergent finding. 2. Severe stenosis of the non dominant right V1 segment with downstream underfilling. 3. Cervical carotid atherosclerosis without ulceration or flow reducing stenosis. 4. ~50% left M1 segment stenosis. Electronically Signed   By: Monte Fantasia M.D.   On: 07/07/2019 06:10   DG Chest 2 View  Result Date: 07/07/2019 CLINICAL DATA:  TIA symptoms EXAM: CHEST - 2 VIEW COMPARISON:  April 25, 2019 FINDINGS: There is no edema or consolidation. Heart size and pulmonary vascularity are within normal limits. No adenopathy. Postoperative changes noted in the right shoulder and lower cervical regions. There is an old healed fracture of the left clavicle. There are foci aortic atherosclerosis. IMPRESSION: No edema or consolidation. Cardiac silhouette within normal limits. Aortic Atherosclerosis (ICD10-I70.0). Electronically Signed   By: Lowella Grip III M.D.   On: 07/07/2019 07:57   CT Code Stroke CTA Neck W/WO contrast  Result Date: 07/07/2019 CLINICAL DATA:  Right arm weakness EXAM: CT ANGIOGRAPHY HEAD AND NECK TECHNIQUE: Multidetector CT imaging of the head and neck was performed using the standard protocol during bolus administration of intravenous contrast. Multiplanar CT image reconstructions and MIPs were obtained to evaluate the vascular anatomy. Carotid stenosis measurements (when applicable) are obtained utilizing NASCET criteria, using the distal internal carotid diameter as the denominator. CONTRAST:  18mL OMNIPAQUE IOHEXOL 350 MG/ML SOLN COMPARISON:  Brain MRI 12/06/2013 FINDINGS: CTA NECK FINDINGS Aortic arch: Unremarkable.  Three vessel branching. Right carotid system: Mixed density atheromatous plaque at the bifurcation without flow limiting stenosis or ulceration. Left carotid system: Mild low-density atheromatous plaque at the common carotid bifurcation without stenosis or ulceration. There are additional plaques in the distal common carotid and distal left ICA which are also  not flow reducing. Vertebral arteries: Proximal subclavian atherosclerosis without flow limiting stenosis. There is a severe stenosis at the proximal aspect of the non dominant right vertebral artery with relative underfilling. The dominant left vertebral artery is widely patent to the dura. Skeleton: Spinal degeneration with multilevel anterolisthesis and C6-7 ACDF. No acute finding. Other neck: Negative Upper chest: Mild emphysema Review of the MIP images confirms the above findings CTA HEAD FINDINGS Anterior circulation: Bilateral carotid siphon atherosclerotic calcification. Notable plaque the left M1 segment with 50% stenosis as estimated on coronal MIPS. No detectable branch occlusion. Negative for aneurysm. Posterior circulation: Strong left vertebral artery dominance with less intense flow in the proximally stenotic right vertebral artery. The right vertebral artery and PICA are patent. The basilar is smooth and widely patent. Symmetric robust flow in the posterior cerebral arteries. Venous sinuses: Unremarkable in the arterial phase. Strong dominance of the left transverse sigmoid system Anatomic variants: None significant Review of the MIP images confirms the above findings IMPRESSION: 1. No emergent finding. 2. Severe stenosis of the non dominant right V1 segment with downstream underfilling. 3. Cervical carotid atherosclerosis without ulceration or flow reducing stenosis. 4. ~50% left M1 segment stenosis. Electronically Signed   By: Monte Fantasia M.D.   On: 07/07/2019 06:10   MR BRAIN WO CONTRAST  Result Date: 07/07/2019 CLINICAL DATA:  TIA. Right  arm weakness. EXAM: MRI HEAD WITHOUT CONTRAST TECHNIQUE: Multiplanar, multiecho pulse sequences of the brain and surrounding structures were obtained without intravenous contrast. COMPARISON:  Head CT 07/07/2019 and MRI 12/06/2013 FINDINGS: Brain: There is no evidence of acute infarct, intracranial hemorrhage, mass, midline shift, or extra-axial fluid  collection. Mild cerebral atrophy is within normal limits for age. T2 hyperintensities in the cerebral white matter, primarily in the periventricular white matter, are unchanged from the prior MRI and nonspecific but compatible with mild chronic small vessel ischemic disease. Vascular: Major intracranial vascular flow voids are preserved. Skull and upper cervical spine: Unremarkable bone marrow signal. Sinuses/Orbits: Bilateral cataract extraction. Minimal mucosal thickening in the ethmoid air cells bilaterally. Clear mastoid air cells. Other: None. IMPRESSION: 1. No acute intracranial abnormality. 2. Mild chronic small vessel ischemic disease. Electronically Signed   By: Logan Bores M.D.   On: 07/07/2019 09:01   CT HEAD CODE STROKE WO CONTRAST  Result Date: 07/07/2019 CLINICAL DATA:  Code stroke.  Right arm weakness EXAM: CT HEAD WITHOUT CONTRAST TECHNIQUE: Contiguous axial images were obtained from the base of the skull through the vertex without intravenous contrast. COMPARISON:  Brain MRI 12/06/2013 FINDINGS: Brain: No evidence of acute infarction, hemorrhage, hydrocephalus, extra-axial collection or mass lesion/mass effect. Vascular: No hyperdense vessel or unexpected calcification. Skull: Normal. Negative for fracture or focal lesion. Sinuses/Orbits: No acute finding. Other: These results were communicated to Dr. Rory Percy at [place] 12/17/2020by text page via the [facility] messaging system. ASPECTS [facility] Stroke Program Early CT Score) - Ganglionic level infarction (caudate, lentiform nuclei, internal capsule, insula, M1-M3 cortex): 7 - Supraganglionic infarction (M4-M6 cortex): 3 Total score (0-10 with 10 being normal): 10 IMPRESSION: Negative head CT.  ASPECTS is 10. Electronically Signed   By: Monte Fantasia M.D.   On: 07/07/2019 05:35    PHYSICAL EXAM  Temp:  [97.2 F (36.2 C)] 97.2 F (36.2 C) (12/17 0512) Pulse Rate:  [59-74] 71 (12/17 1200) Resp:  [12-19] 14 (12/17 1245) BP:  (134-164)/(78-92) 164/90 (12/17 1245) SpO2:  [94 %-98 %] 98 % (12/17 1240)  General - Well nourished, well developed, in no apparent distress.  Ophthalmologic - fundi not visualized due to noncooperation.  Cardiovascular - Regular rhythm and rate.  Mental Status -  Level of arousal and orientation to time, place, and person were intact. Language including expression, naming, repetition, comprehension was assessed and found intact. Attention span and concentration were normal. Recent and remote memory were intact. Fund of Knowledge was assessed and was intact.  Cranial Nerves II - XII - II - Visual field intact OU. III, IV, VI - Extraocular movements intact. V - Facial sensation intact bilaterally. VII - Facial movement intact bilaterally. VIII - Hearing & vestibular intact bilaterally. X - Palate elevates symmetrically. XI - Chin turning & shoulder shrug intact bilaterally. XII - Tongue protrusion intact.  Motor Strength - The patient's strength was normal in all extremities except chronic right rotator cuff injury so the RUE proximal 3/5.  Bulk was normal and fasciculations were absent.   Motor Tone - Muscle tone was assessed at the neck and appendages and was normal.  Reflexes - The patient's reflexes were symmetrical in all extremities and he had no pathological reflexes.  Sensory - Light touch, temperature/pinprick were assessed and were symmetrical.    Coordination - The patient had normal movements in the hands and feet with no ataxia or dysmetria.  Tremor was absent.  Gait and Station - deferred.   ASSESSMENT/PLAN Mr. Kavaughn  P Apo is a 73 y.o. male with history of COPD, hypertension, polysubstance abuse in the past, hepatitis C, hypercholesterolemia, prostate cancer presenting with transient R arm weakness.   TIA -could be related to left M1 50% stenosis.  Code Stroke CT head No acute abnormality. ASPECTS 10.     CTA head & neck no ELVO. Severe stenosis RV1.  Cervical right ICA atherosclerosis. L M1 ~50% stenosis.  MRI  No acute abnormality  2D Echo EF 55 to 60%  LDL 125  HgbA1c 6.5  UDS neg  Lovenox 40 mg sq daily for VTE prophylaxis  aspirin 81 mg daily 3 times per week prior to admission, now on aspirin 325 mg daily. Recommend aspirin 81 and Plavix 75 for 3 weeks and then aspirin alone.  Therapy recommendations:  No therapy needs  Disposition:  Return home  Hypertension  Stable . BP goal normotensive  Hyperlipidemia  Home meds:  crestor 10 3 days a week and lovaza  Now on crestor 20 daily  LDL 125, goal < 70  Hx of intolerance of lipitor  Continue statin at discharge  Pre-Diabetes  HgbA1c 6.5, goal < 7.0  CBGs  SSI  PCP follow up  Other Stroke Risk Factors  Advanced age  Former Cigarette smoker  Other Active Problems  [facility] day # 0  Neurology will sign off. Please call with questions. Pt will follow up with stroke clinic NP at [facility] in about 4 weeks. Thanks for the consult.  Rosalin Hawking, MD PhD Stroke Neurology 07/07/2019 3:14 PM  To contact Stroke Continuity provider, please refer to [email or web address] After hours, contact General Neurology

## 2019-07-07 NOTE — Care Management Obs Status (Signed)
[place] NOTIFICATION   Patient Details  Name: George Watson MRN: [id number] Date of Birth: 14-Apr-1946   Medicare Observation Status Notification Given:  Yes    Nailyn Dearinger, LCSW 07/07/2019, 2:29 PM

## 2019-07-07 NOTE — Progress Notes (Deleted)
Started vLTM   Event button tested.(RN shown)   Notified neurology

## 2019-07-07 NOTE — H&P (Signed)
History and Physical    George Watson [id number] DOB: 1946/07/21 DOA: 07/07/2019  Referring MD/NP/PA: Shela Leff, MD PCP: [name], MD  Patient coming from: home  Chief Complaint: Right-sided weakness  I have personally briefly reviewed patient's old medical records in [place]   HPI: George Watson is a 73 y.o. male with medical history significant of hypertension, COPD/emphysema, BPH, treated hepatitis C, prostate cancer, remote tobacco use, and remote alcohol abuse.  He presents with acute onset of right upper and lower leg weakness around 3 AM this morning.  Patient was last noted to be normal at around 11 PM when he went to sleep.  He reported being unable to stand on the right leg with inability to control his right arm.  Symptoms lasted approximately 1 hour and started to resolve while in the ambulance.  Reports having similar symptoms occur on October 21, but reported only lasted 20 minutes at that time.  Denied having any change in speech, facial droop, headache, change in vision, fever, chills, nausea, or vomiting.  He reports chronically having lower back pain and wheezing due to his history of emphysema.  He reports that his breathing is at his baseline.  He quit smoking over 16 years ago and has not picked up drink in over 30 years.  ED Course: Patient presented to the hospital as a code stroke.  Initial evaluation with CT scanning of the brain did not reveal any acute abnormalities.  Dr. Rory Percy of neurology was consulted.  Patient was out of the window for TPA and symptoms had resolved.  NIH stroke scale was 0.  Vital signs were noted to be relatively stable.  Labs significant for BUN 26 and creatinine 0.7.  CTA of the head and neck revealed severe stenosis of the right V1 segment with downstream filling and cervical carotid atherosclerosis.  Point-of-care COVID-19 screening was negative.  TRH was called to admit for completion of TIA work-up.    Review of  Systems  Constitutional: Negative for chills, fever and weight loss.  HENT: Negative for congestion and nosebleeds.   Eyes: Negative for photophobia and pain.  Respiratory: Positive for wheezing. Negative for hemoptysis.   Cardiovascular: Negative for chest pain, palpitations and leg swelling.  Gastrointestinal: Negative for abdominal pain, nausea and vomiting.  Genitourinary: Negative for dysuria and hematuria.  Musculoskeletal: Positive for back pain.  Skin: Negative for itching.  Neurological: Positive for focal weakness. Negative for speech change, loss of consciousness and headaches.  Psychiatric/Behavioral: Negative for memory loss and substance abuse.    Past Medical History:  Diagnosis Date  . BPH (benign prostatic hypertrophy)   . COPD (chronic obstructive pulmonary disease) ([place])   . DDD (degenerative disc disease), lumbosacral   . Elevated PSA   . Essential hypertension, benign    diet controlled  . Fracture of shaft of clavicle   . GERD (gastroesophageal reflux disease)   . H/O drug abuse ([place])    multisubstance  . Hepatitis C 10/1996  . HSV-2 (herpes simplex virus 2) infection   . Hx of biopsy 08/2004   liver  . Inguinal hernia    right  . Other and unspecified hyperlipidemia   . Prostate cancer ([place])   . Substance abuse ([place])   . Tuberculosis     Past Surgical History:  Procedure Laterality Date  . CERVICAL SPINE SURGERY    . fracture ribs     3  . HERNIA REPAIR    . INGUINAL HERNIA  REPAIR  07/01/2012   Procedure: HERNIA REPAIR INGUINAL ADULT;  Surgeon: Madilyn Hook, DO;  Location: [facility];  Service: General;  Laterality: Right;  with Mesh  . left knee meniscus repair  1992  . right rotator cuff  1992  . right shoulder arthroscopy  2011     reports that he quit smoking about 16 years ago. His smoking use included cigarettes. He quit after 43.00 years of use. He has never used smokeless tobacco. He reports that he does not drink alcohol or use  drugs.  No Known Allergies  Family History  Problem Relation Age of Onset  . Cancer Sister        breast  . Leukemia Mother   . Cancer Father     Prior to Admission medications   Medication Sig Start Date End Date Taking? Authorizing Provider  albuterol (VENTOLIN HFA) 108 (90 Base) MCG/ACT inhaler INHALE 2 PUFFS INTO THE LUNGS EVERY 6 (SIX) HOURS AS NEEDED FOR SHORTNESS OF BREATH. 04/06/19   Janith Lima, MD  ANORO ELLIPTA 62.5-25 MCG/INH AEPB INHALE 1 PUFF BY MOUTH EVERY DAY 04/15/19   Janith Lima, MD  aspirin 81 MG chewable tablet Chew by mouth daily. Currently taking MWF    [provider]  cholecalciferol (VITAMIN D3) 25 MCG (1000 UT) tablet Take 1,000 Units by mouth daily.    [provider]  Coenzyme Q10-Vitamin E 100-300 MG-UNIT CHEW Chew by mouth.    [provider]  dutasteride (AVODART) 0.5 MG capsule Take 0.5 mg by mouth daily.    [provider]  famotidine (HM FAMOTIDINE) 20 MG tablet Take 20 mg by mouth 2 (two) times daily.    [provider]  glucosamine-chondroitin 500-400 MG tablet Take 3 tablets by mouth daily. 02/15/18   Marrian Salvage, FNP  Multiple Vitamins-Minerals (CENTRUM SILVER PO) Take by mouth.    [provider]  olmesartan (BENICAR) 40 MG tablet Take 1 tablet (40 mg total) by mouth daily. 01/21/19   Janith Lima, MD  omega-3 acid ethyl esters (LOVAZA) 1 g capsule Take 2 capsules (2 g total) by mouth 2 (two) times daily. 04/08/19   Janith Lima, MD  rosuvastatin (CRESTOR) 10 MG tablet Take 1 tablet (10 mg total) by mouth daily. 06/13/19   [name], MD  tamsulosin (FLOMAX) 0.4 MG CAPS capsule Take 0.4 mg by mouth daily.     [provider]    Physical Exam:  Constitutional: Elderly male NAD, calm, comfortable Vitals:   07/07/19 0515 07/07/19 0530 07/07/19 0545 07/07/19 0622  BP: (!) 149/81 (!) 155/79 (!) 150/79 (!) 151/78  Pulse: 66 (!) 59 72 68  Resp: 15 15 17 18   Temp:       TempSrc:      SpO2: 98% 98% 97% 98%   Eyes: PERRL, lids and conjunctivae normal ENMT: Mucous membranes are moist. Posterior pharynx clear of any exudate or lesions.   Neck: normal, supple, no masses, no thyromegaly Respiratory: Normal respiratory effort with positive expiratory wheeze appreciated mildly decreased aeration.  Patient maintaining O2 saturations on room air and able to talk in complete sentences. Cardiovascular: Regular rate and rhythm, no murmurs / rubs / gallops. No extremity edema. 2+ pedal pulses. No carotid bruits.  Abdomen: no tenderness, no masses palpated. No hepatosplenomegaly. Bowel sounds positive.  Musculoskeletal: no clubbing / cyanosis.  Decreased range of motion of the right shoulder. Skin: no rashes, lesions, ulcers. No induration Neurologic: CN 2-12 grossly intact. Sensation  intact, DTR normal. Strength 5/5 in all 4.  Psychiatric: Normal judgment and insight. Alert and oriented x 3. Normal mood.     Labs on Admission: I have personally reviewed following labs and imaging studies  CBC: Recent Labs  Lab 07/07/19 0533  HGB 16.0  HCT [id number]   Basic Metabolic Panel: Recent Labs  Lab 07/07/19 0522 07/07/19 0533  NA 139 140  K 4.0 3.9  CL 106 109  CO2 21*  --   GLUCOSE 121* 118*  BUN 24* 26*  CREATININE 0.82 0.70  CALCIUM 10.0  --    GFR: CrCl cannot be calculated (Unknown ideal weight.). Liver Function Tests: Recent Labs  Lab 07/07/19 0522  AST 23  ALT 24  ALKPHOS 53  BILITOT 0.7  PROT 7.3  ALBUMIN 4.3   No results for input(s): LIPASE, AMYLASE in the last 168 hours. No results for input(s): AMMONIA in the last 168 hours. Coagulation Profile: Recent Labs  Lab 07/07/19 0522  INR 1.0   Cardiac Enzymes: No results for input(s): CKTOTAL, CKMB, CKMBINDEX, TROPONINI in the last 168 hours. BNP (last 3 results) No results for input(s): PROBNP in the last 8760 hours. HbA1C: No results for input(s): HGBA1C in the last 72  hours. CBG: Recent Labs  Lab 07/07/19 0517  GLUCAP 119*   Lipid Profile: No results for input(s): CHOL, HDL, LDLCALC, TRIG, CHOLHDL, LDLDIRECT in the last 72 hours. Thyroid Function Tests: No results for input(s): TSH, T4TOTAL, FREET4, T3FREE, THYROIDAB in the last 72 hours. Anemia Panel: No results for input(s): VITAMINB12, FOLATE, FERRITIN, TIBC, IRON, RETICCTPCT in the last 72 hours. Urine analysis:    Component Value Date/Time   COLORURINE STRAW (A) 07/07/2019 0522   APPEARANCEUR CLEAR 07/07/2019 0522   LABSPEC 1.030 07/07/2019 0522   PHURINE 7.0 07/07/2019 0522   GLUCOSEU NEGATIVE 07/07/2019 0522   GLUCOSEU NEGATIVE 03/17/2018 1302   HGBUR NEGATIVE 07/07/2019 0522   BILIRUBINUR NEGATIVE 07/07/2019 0522   BILIRUBINUR neg 07/26/2013 1034   KETONESUR NEGATIVE 07/07/2019 0522   PROTEINUR NEGATIVE 07/07/2019 0522   UROBILINOGEN 0.2 03/17/2018 1302   NITRITE NEGATIVE 07/07/2019 0522   LEUKOCYTESUR NEGATIVE 07/07/2019 0522   Sepsis Labs: No results found for this or any previous visit (from the past 240 hour(s)).   Radiological Exams on Admission: CT Code Stroke CTA Head W/WO contrast  Result Date: 07/07/2019 CLINICAL DATA:  Right arm weakness EXAM: CT ANGIOGRAPHY HEAD AND NECK TECHNIQUE: Multidetector CT imaging of the head and neck was performed using the standard protocol during bolus administration of intravenous contrast. Multiplanar CT image reconstructions and MIPs were obtained to evaluate the vascular anatomy. Carotid stenosis measurements (when applicable) are obtained utilizing NASCET criteria, using the distal internal carotid diameter as the denominator. CONTRAST:  58mL OMNIPAQUE IOHEXOL 350 MG/ML SOLN COMPARISON:  Brain MRI 12/06/2013 FINDINGS: CTA NECK FINDINGS Aortic arch: Unremarkable.  Three vessel branching. Right carotid system: Mixed density atheromatous plaque at the bifurcation without flow limiting stenosis or ulceration. Left carotid system: Mild  low-density atheromatous plaque at the common carotid bifurcation without stenosis or ulceration. There are additional plaques in the distal common carotid and distal left ICA which are also not flow reducing. Vertebral arteries: Proximal subclavian atherosclerosis without flow limiting stenosis. There is a severe stenosis at the proximal aspect of the non dominant right vertebral artery with relative underfilling. The dominant left vertebral artery is widely patent to the dura. Skeleton: Spinal degeneration with multilevel anterolisthesis and C6-7 ACDF. No acute finding. Other neck: Negative  Upper chest: Mild emphysema Review of the MIP images confirms the above findings CTA HEAD FINDINGS Anterior circulation: Bilateral carotid siphon atherosclerotic calcification. Notable plaque the left M1 segment with 50% stenosis as estimated on coronal MIPS. No detectable branch occlusion. Negative for aneurysm. Posterior circulation: Strong left vertebral artery dominance with less intense flow in the proximally stenotic right vertebral artery. The right vertebral artery and PICA are patent. The basilar is smooth and widely patent. Symmetric robust flow in the posterior cerebral arteries. Venous sinuses: Unremarkable in the arterial phase. Strong dominance of the left transverse sigmoid system Anatomic variants: None significant Review of the MIP images confirms the above findings IMPRESSION: 1. No emergent finding. 2. Severe stenosis of the non dominant right V1 segment with downstream underfilling. 3. Cervical carotid atherosclerosis without ulceration or flow reducing stenosis. 4. ~50% left M1 segment stenosis. Electronically Signed   By: Monte Fantasia M.D.   On: 07/07/2019 06:10   CT Code Stroke CTA Neck W/WO contrast  Result Date: 07/07/2019 CLINICAL DATA:  Right arm weakness EXAM: CT ANGIOGRAPHY HEAD AND NECK TECHNIQUE: Multidetector CT imaging of the head and neck was performed using the standard protocol during  bolus administration of intravenous contrast. Multiplanar CT image reconstructions and MIPs were obtained to evaluate the vascular anatomy. Carotid stenosis measurements (when applicable) are obtained utilizing NASCET criteria, using the distal internal carotid diameter as the denominator. CONTRAST:  55mL OMNIPAQUE IOHEXOL 350 MG/ML SOLN COMPARISON:  Brain MRI 12/06/2013 FINDINGS: CTA NECK FINDINGS Aortic arch: Unremarkable.  Three vessel branching. Right carotid system: Mixed density atheromatous plaque at the bifurcation without flow limiting stenosis or ulceration. Left carotid system: Mild low-density atheromatous plaque at the common carotid bifurcation without stenosis or ulceration. There are additional plaques in the distal common carotid and distal left ICA which are also not flow reducing. Vertebral arteries: Proximal subclavian atherosclerosis without flow limiting stenosis. There is a severe stenosis at the proximal aspect of the non dominant right vertebral artery with relative underfilling. The dominant left vertebral artery is widely patent to the dura. Skeleton: Spinal degeneration with multilevel anterolisthesis and C6-7 ACDF. No acute finding. Other neck: Negative Upper chest: Mild emphysema Review of the MIP images confirms the above findings CTA HEAD FINDINGS Anterior circulation: Bilateral carotid siphon atherosclerotic calcification. Notable plaque the left M1 segment with 50% stenosis as estimated on coronal MIPS. No detectable branch occlusion. Negative for aneurysm. Posterior circulation: Strong left vertebral artery dominance with less intense flow in the proximally stenotic right vertebral artery. The right vertebral artery and PICA are patent. The basilar is smooth and widely patent. Symmetric robust flow in the posterior cerebral arteries. Venous sinuses: Unremarkable in the arterial phase. Strong dominance of the left transverse sigmoid system Anatomic variants: None significant Review  of the MIP images confirms the above findings IMPRESSION: 1. No emergent finding. 2. Severe stenosis of the non dominant right V1 segment with downstream underfilling. 3. Cervical carotid atherosclerosis without ulceration or flow reducing stenosis. 4. ~50% left M1 segment stenosis. Electronically Signed   By: Monte Fantasia M.D.   On: 07/07/2019 06:10   CT HEAD CODE STROKE WO CONTRAST  Result Date: 07/07/2019 CLINICAL DATA:  Code stroke.  Right arm weakness EXAM: CT HEAD WITHOUT CONTRAST TECHNIQUE: Contiguous axial images were obtained from the base of the skull through the vertex without intravenous contrast. COMPARISON:  Brain MRI 12/06/2013 FINDINGS: Brain: No evidence of acute infarction, hemorrhage, hydrocephalus, extra-axial collection or mass lesion/mass effect. Vascular: No hyperdense vessel or  unexpected calcification. Skull: Normal. Negative for fracture or focal lesion. Sinuses/Orbits: No acute finding. Other: These results were communicated to Dr. Rory Percy at [place] 12/17/2020by text page via the [facility] messaging system. ASPECTS [facility] Stroke Program Early CT Score) - Ganglionic level infarction (caudate, lentiform nuclei, internal capsule, insula, M1-M3 cortex): 7 - Supraganglionic infarction (M4-M6 cortex): 3 Total score (0-10 with 10 being normal): 10 IMPRESSION: Negative head CT.  ASPECTS is 10. Electronically Signed   By: Monte Fantasia M.D.   On: 07/07/2019 05:35    EKG: Independently reviewed.  Sinus rhythm at 64 bpm  Assessment/Plan TIA (transient ischemic attack): Acute.  Patient presents with complaints of right arm incoordination with weakness and right leg weakness.  CT scan of the brain negative for any acute abnormalities.  Patient with -Admit to telemetry bed  -Stroke order set initiated -Neuro checks -Follow-up urine drug screen -Check  MRI w/o contrast  -PT/OT/Speech to evaluate and treat -Check echocardiogram -Check Hemoglobin A1c and lipid panel in  a.m. -ASA -Appreciate neurology consultative services, will follow-up  Essential hypertension at home patient on medications of olmesartan 40 mg daily. -Continue pharmacy substitution for olmesartan in a.m.  Prostate cancer, BPH: Patient reports having history of prostate cancer with this last PSA noted to be 1.  Currently under active surveillance on Avodart. -Continue Avodart and Flomax -Continue outpatient follow-up  Hyperlipidemia: Patient's home medication regimen includes Crestor 10 mg nightly. -Atorvastatin 80 mg nightly  Hepatitis C: Patient reports being status post treatment by [name] of gastroenterology at [facility].  History of polysubstance abuse: Patient reports that he no longer uses tobacco or alcohol or illicit drugs.   DVT prophylaxis: lovenox Code Status: Full Family Communication: Family requested to be updated at this time. Disposition Plan: Likely discharge home if work-up negative Consults called: Neurology Admission status: Observation  Norval Morton MD [facility] Pager [phone]   If 7PM-7AM, please contact night-coverage [email or web address] Password [email or web address]  07/07/2019, 7:19 AM

## 2019-07-07 NOTE — ED Provider Notes (Addendum)
[facility] EMERGENCY DEPARTMENT Provider Note   CSN: [id number] Arrival date & time: 07/07/19  0502  An emergency department physician performed an initial assessment on this suspected stroke patient at [place].  History Chief Complaint  Patient presents with  . Right Arm/Leg Weakness    George Watson is a 73 y.o. male.  The history is provided by the patient.  Weakness Severity:  Severe Onset quality:  Sudden Timing:  Constant Progression:  Partially resolved Chronicity:  Recurrent Context: not alcohol use, not allergies, not change in medication, not decreased sleep, not dehydration, not drug use, not increased activity, not pinched nerve, not recent infection, not stress and not urinary tract infection   Relieved by: time. Ineffective treatments:  None tried Associated symptoms: stroke symptoms   Associated symptoms: no abdominal pain, no anorexia, no aphasia, no arthralgias, no ataxia, no chest pain, no cough, no diarrhea, no difficulty walking, no dizziness, no drooling, no dysphagia, no dysuria, no numbness in extremities, no falls, no fever, no foul-smelling urine, no frequency, no headaches, no hematochezia, no lethargy, no loss of consciousness, no melena, no myalgias, no nausea, no near-syncope, no seizures, no sensory-motor deficit, no shortness of breath, no syncope, no urgency, no vision change and no vomiting   Risk factors: no anemia   Patient with h/o HTN who presents with second episode of weakness in a month      Past Medical History:  Diagnosis Date  . BPH (benign prostatic hypertrophy)   . COPD (chronic obstructive pulmonary disease) ([place])   . DDD (degenerative disc disease), lumbosacral   . Elevated PSA   . Essential hypertension, benign    diet controlled  . Fracture of shaft of clavicle   . GERD (gastroesophageal reflux disease)   . H/O drug abuse ([place])    multisubstance  . Hepatitis C 10/1996  . HSV-2 (herpes simplex virus 2)  infection   . Hx of biopsy 08/2004   liver  . Inguinal hernia    right  . Other and unspecified hyperlipidemia   . Prostate cancer ([place])   . Substance abuse ([place])   . Tuberculosis     Patient Active Problem List   Diagnosis Date Noted  . Dilated aortic root ([place]) 06/13/2019  . Pure hypertriglyceridemia 04/08/2019  . Fear of flying 02/11/2018  . Tear of right rotator cuff 08/31/2017  . Prediabetes 01/06/2017  . Atherosclerosis of aorta ([place]) 07/09/2016  . Degeneration of lumbar or lumbosacral intervertebral disc 02/15/2014  . Essential hypertension, benign   . Hyperlipidemia with target LDL less than 130   . HSV-2 (herpes simplex virus 2) infection   . COPD (chronic obstructive pulmonary disease) ([place])   . Benign prostatic hyperplasia   . DDD (degenerative disc disease), lumbosacral   . Prostate cancer ([place])   . Hepatitis C, chronic ([place]) 10/19/1996    Past Surgical History:  Procedure Laterality Date  . CERVICAL SPINE SURGERY    . fracture ribs     3  . HERNIA REPAIR    . INGUINAL HERNIA REPAIR  07/01/2012   Procedure: HERNIA REPAIR INGUINAL ADULT;  Surgeon: Madilyn Hook, DO;  Location: [facility]S;  Service: General;  Laterality: Right;  with Mesh  . left knee meniscus repair  1992  . right rotator cuff  1992  . right shoulder arthroscopy  2011       Family History  Problem Relation Age of Onset  . Cancer Sister  breast  . Leukemia Mother   . Cancer Father     Social History   Tobacco Use  . Smoking status: Former Smoker    Years: 43.00    Types: Cigarettes    Quit date: 07/21/2002    Years since quitting: 16.9  . Smokeless tobacco: Never Used  Substance Use Topics  . Alcohol use: No  . Drug use: No    Comment: 26 years ago cocaine, heroin. methadone    Home Medications Prior to Admission medications   Medication Sig Start Date End Date Taking? Authorizing Provider  albuterol (VENTOLIN HFA) 108 (90 Base) MCG/ACT inhaler INHALE 2 PUFFS INTO THE  LUNGS EVERY 6 (SIX) HOURS AS NEEDED FOR SHORTNESS OF BREATH. 04/06/19   Janith Lima, MD  ANORO ELLIPTA 62.5-25 MCG/INH AEPB INHALE 1 PUFF BY MOUTH EVERY DAY 04/15/19   Janith Lima, MD  aspirin 81 MG chewable tablet Chew by mouth daily. Currently taking MWF    [provider]  cholecalciferol (VITAMIN D3) 25 MCG (1000 UT) tablet Take 1,000 Units by mouth daily.    [provider]  Coenzyme Q10-Vitamin E 100-300 MG-UNIT CHEW Chew by mouth.    [provider]  dutasteride (AVODART) 0.5 MG capsule Take 0.5 mg by mouth daily.    [provider]  famotidine (HM FAMOTIDINE) 20 MG tablet Take 20 mg by mouth 2 (two) times daily.    [provider]  glucosamine-chondroitin 500-400 MG tablet Take 3 tablets by mouth daily. 02/15/18   Marrian Salvage, FNP  Multiple Vitamins-Minerals (CENTRUM SILVER PO) Take by mouth.    [provider]  olmesartan (BENICAR) 40 MG tablet Take 1 tablet (40 mg total) by mouth daily. 01/21/19   Janith Lima, MD  omega-3 acid ethyl esters (LOVAZA) 1 g capsule Take 2 capsules (2 g total) by mouth 2 (two) times daily. 04/08/19   Janith Lima, MD  rosuvastatin (CRESTOR) 10 MG tablet Take 1 tablet (10 mg total) by mouth daily. 06/13/19   [name], MD  tamsulosin (FLOMAX) 0.4 MG CAPS capsule Take 0.4 mg by mouth daily.     [provider]    Allergies    Patient has no known allergies.  Review of Systems   Review of Systems  Constitutional: Negative for fever.  HENT: Negative for drooling.   Eyes: Negative for visual disturbance.  Respiratory: Negative for cough and shortness of breath.   Cardiovascular: Negative for chest pain, syncope and near-syncope.  Gastrointestinal: Negative for abdominal pain, anorexia, diarrhea, dysphagia, hematochezia, melena, nausea and vomiting.  Genitourinary: Negative for dysuria, frequency and urgency.  Musculoskeletal: Negative for arthralgias, falls and myalgias.   Neurological: Positive for weakness. Negative for dizziness, seizures, loss of consciousness and headaches.  Psychiatric/Behavioral: Negative for confusion.    Physical Exam Updated Vital Signs BP (!) 150/79   Pulse 72   Temp (!) 97.2 F (36.2 C) (Oral)   Resp 17   SpO2 97%   Physical Exam Vitals and nursing note reviewed.  Constitutional:      Appearance: Normal appearance. He is not ill-appearing or diaphoretic.  HENT:     Head: Normocephalic and atraumatic.     Nose: Nose normal.  Eyes:     Conjunctiva/sclera: Conjunctivae normal.     Pupils: Pupils are equal, round, and reactive to light.  Cardiovascular:     Rate and Rhythm: Normal rate and regular rhythm.     Pulses: Normal pulses.  Heart sounds: Normal heart sounds.  Pulmonary:     Effort: Pulmonary effort is normal.     Breath sounds: Normal breath sounds.  Abdominal:     General: Abdomen is flat. Bowel sounds are normal.     Tenderness: There is no abdominal tenderness. There is no guarding or rebound.  Musculoskeletal:        General: Normal range of motion.     Cervical back: Normal range of motion and neck supple.  Skin:    General: Skin is warm and dry.     Capillary Refill: Capillary refill takes less than 2 seconds.  Neurological:     General: No focal deficit present.     Mental Status: He is alert and oriented to person, place, and time.     Cranial Nerves: No cranial nerve deficit.     Deep Tendon Reflexes: Reflexes normal.     Comments: 5-/5 RUE  Psychiatric:        Mood and Affect: Mood normal.        Behavior: Behavior normal.     ED Results / Procedures / Treatments   Labs (all labs ordered are listed, but only abnormal results are displayed) Results for orders placed or performed during the hospital encounter of 07/07/19  Protime-INR  Result Value Ref Range   Prothrombin Time 13.4 11.4 - 15.2 seconds   INR 1.0 0.8 - 1.2  APTT  Result Value Ref Range   aPTT 28 24 - 36 seconds    I-stat chem 8, ED  Result Value Ref Range   Sodium 140 135 - 145 mmol/L   Potassium 3.9 3.5 - 5.1 mmol/L   Chloride 109 98 - 111 mmol/L   BUN 26 (H) 8 - 23 mg/dL   Creatinine, Ser 0.70 0.61 - 1.24 mg/dL   Glucose, Bld 118 (H) 70 - 99 mg/dL   Calcium, Ion 1.11 (L) 1.15 - 1.40 mmol/L   TCO2 22 22 - 32 mmol/L   Hemoglobin 16.0 13.0 - 17.0 g/dL   HCT 47.0 39.0 - 52.0 %  CBG monitoring, ED  Result Value Ref Range   Glucose-Capillary 119 (H) 70 - 99 mg/dL   CT HEAD CODE STROKE WO CONTRAST  Result Date: 07/07/2019 CLINICAL DATA:  Code stroke.  Right arm weakness EXAM: CT HEAD WITHOUT CONTRAST TECHNIQUE: Contiguous axial images were obtained from the base of the skull through the vertex without intravenous contrast. COMPARISON:  Brain MRI 12/06/2013 FINDINGS: Brain: No evidence of acute infarction, hemorrhage, hydrocephalus, extra-axial collection or mass lesion/mass effect. Vascular: No hyperdense vessel or unexpected calcification. Skull: Normal. Negative for fracture or focal lesion. Sinuses/Orbits: No acute finding. Other: These results were communicated to Dr. Rory Percy at [place] 12/17/2020by text page via the [facility] messaging system. ASPECTS [facility] Stroke Program Early CT Score) - Ganglionic level infarction (caudate, lentiform nuclei, internal capsule, insula, M1-M3 cortex): 7 - Supraganglionic infarction (M4-M6 cortex): 3 Total score (0-10 with 10 being normal): 10 IMPRESSION: Negative head CT.  ASPECTS is 10. Electronically Signed   By: Monte Fantasia M.D.   On: 07/07/2019 05:35  '  EKG  EKG Interpretation  Date/Time:  Thursday July 07 2019 05:10:43 EST Ventricular Rate:  64 PR Interval:    QRS Duration: 107 QT Interval:  405 QTC Calculation: 418 R Axis:   14 Text Interpretation: Sinus rhythm Confirmed by Dory Horn) on 07/07/2019 6:00:25 AM       Radiology CT HEAD CODE STROKE WO CONTRAST  Result Date:  07/07/2019 CLINICAL DATA:  Code stroke.  Right arm  weakness EXAM: CT HEAD WITHOUT CONTRAST TECHNIQUE: Contiguous axial images were obtained from the base of the skull through the vertex without intravenous contrast. COMPARISON:  Brain MRI 12/06/2013 FINDINGS: Brain: No evidence of acute infarction, hemorrhage, hydrocephalus, extra-axial collection or mass lesion/mass effect. Vascular: No hyperdense vessel or unexpected calcification. Skull: Normal. Negative for fracture or focal lesion. Sinuses/Orbits: No acute finding. Other: These results were communicated to Dr. Rory Percy at [place] 12/17/2020by text page via the [facility] messaging system. ASPECTS [facility] Stroke Program Early CT Score) - Ganglionic level infarction (caudate, lentiform nuclei, internal capsule, insula, M1-M3 cortex): 7 - Supraganglionic infarction (M4-M6 cortex): 3 Total score (0-10 with 10 being normal): 10 IMPRESSION: Negative head CT.  ASPECTS is 10. Electronically Signed   By: Monte Fantasia M.D.   On: 07/07/2019 05:35    Procedures Procedures (including critical care time)  Medications Ordered in ED Medications  iohexol (OMNIPAQUE) 350 MG/ML injection 75 mL (75 mLs Intravenous Contrast Given 07/07/19 0543)    ED Course  I have reviewed the triage vital signs and the nursing notes.  Pertinent labs & imaging results that were available during my care of the patient were reviewed by me and considered in my medical decision making (see chart for details).     Final Clinical Impression(s) / ED Diagnoses Final diagnoses:  TIA (transient ischemic attack)   Admit to medicine    Neela Zecca, MD 07/07/19 [id number]    Randal Buba, Zuley Lutter, MD 07/07/19 [id number]

## 2019-07-07 NOTE — ED Notes (Signed)
Patient transported to CT scan . 

## 2019-07-07 NOTE — ED Notes (Signed)
Notified Dr Tamala Julian that pt feels he is having a similar occurrence of right sided weakness.

## 2019-07-08 ENCOUNTER — Inpatient Hospital Stay: Admission: RE | Admit: 2019-07-08 | Payer: PPO | Source: Ambulatory Visit

## 2019-07-08 ENCOUNTER — Observation Stay (HOSPITAL_COMMUNITY): Payer: PPO

## 2019-07-08 DIAGNOSIS — G459 Transient cerebral ischemic attack, unspecified: Secondary | ICD-10-CM | POA: Diagnosis not present

## 2019-07-08 DIAGNOSIS — I10 Essential (primary) hypertension: Secondary | ICD-10-CM | POA: Diagnosis not present

## 2019-07-08 MED ORDER — UMECLIDINIUM-VILANTEROL 62.5-25 MCG/INH IN AEPB
1.0000 | INHALATION_SPRAY | Freq: Every day | RESPIRATORY_TRACT | Status: DC
  Filled 2019-07-08: qty 14

## 2019-07-08 MED ORDER — ROSUVASTATIN CALCIUM 20 MG PO TABS: 20.0000 mg | ORAL_TABLET | Freq: Every day | ORAL | 1 refills | Status: DC

## 2019-07-08 MED ORDER — CLOPIDOGREL BISULFATE 75 MG PO TABS: 75.0000 mg | ORAL_TABLET | Freq: Every day | ORAL | 0 refills | Status: AC

## 2019-07-08 MED ORDER — ASPIRIN 81 MG PO TBEC: 81.0000 mg | DELAYED_RELEASE_TABLET | Freq: Every day | ORAL | Status: AC

## 2019-07-08 NOTE — Procedures (Signed)
Patient Name: George Watson  MRN: [id number]  Epilepsy Attending: Lora Havens  Referring Physician/Provider: Dr Fuller Plan Date: 07/08/2019 Duration: 25.52 mins  Patient history: 73yo M with right sided weakness. EEG to evaluate for seizure  Level of alertness: awake, drowsy  AEDs during EEG study: None  Technical aspects: This EEG study was done with scalp electrodes positioned according to the 10-20 International system of electrode placement. Electrical activity was acquired at a sampling rate of 500Hz  and reviewed with a high frequency filter of 70Hz  and a low frequency filter of 1Hz . EEG data were recorded continuously and digitally stored.   Description: The posterior dominant rhythm consists of 10 Hz activity of moderate voltage (25-35 uV) seen predominantly in posterior head regions, symmetric and reactive to eye opening and eye closing.     Drowsiness was characterized by attenuation of the posterior background rhythm.  Physiologic photic driving was seen during photic stimulation.  Hyperventilation was not performed.  IMPRESSION: This study is within normal limits. No seizures or epileptiform discharges were seen throughout the recording.  [name] [name]

## 2019-07-08 NOTE — Progress Notes (Signed)
OT Cancellation Note  Patient Details Name: George Watson MRN: [id number] DOB: March 27, 1946   Cancelled Treatment:    Reason Eval/Treat Not Completed: OT screened, no needs identified, will sign off. Patient standing in room upon entry, independently exiting bathroom.  Pt reports returned to baseline, with no deficits noted.  No OT needs identified at this time, OT will sign off. If further needs arise, please re-consult.   Jolaine Artist, OT Acute Rehabilitation Services Pager [phone] Office [phone]   Delight Stare 07/08/2019, 10:12 AM

## 2019-07-08 NOTE — Progress Notes (Signed)
New order to discharge patient.  Tele and IV discontinued.  AVS paperwork reviewed with patient.  All questions answered.  Patient transported to main entrance via w/c by this Probation officer.  Wife awaiting patient to transport home via personal vehicle.  Patient in agreement with discharge.

## 2019-07-08 NOTE — Progress Notes (Signed)
STROKE TEAM PROGRESS NOTE   INTERVAL HISTORY Pt sitting in bed, stated that his symptoms have resolved, he felt great. His MRI showed no stroke. He is eager to go home.   Vitals:   07/07/19 1852 07/07/19 2030 07/07/19 2314 07/08/19 0752  BP:   138/74 135/87  Pulse: 75  67 70  Resp: 19  16   Temp:   97.8 F (36.6 C) 97.8 F (36.6 C)  TempSrc:   Oral Oral  SpO2: 97%  99% 95%  Weight:  76.2 kg    Height:  5\' 11"  (1.803 m)      CBC:  Recent Labs  Lab 07/07/19 0522 07/07/19 0533  WBC 9.2  --   NEUTROABS 3.7  --   HGB 16.0 16.0  HCT 48.8 47.0  MCV 94.4  --   PLT 278  --     Basic Metabolic Panel:  Recent Labs  Lab 07/07/19 0522 07/07/19 0533  NA 139 140  K 4.0 3.9  CL 106 109  CO2 21*  --   GLUCOSE 121* 118*  BUN 24* 26*  CREATININE 0.82 0.70  CALCIUM 10.0  --    Lipid Panel:     Component Value Date/Time   CHOL 195 07/07/2019 0552   TRIG 181 (H) 07/07/2019 0552   HDL 34 (L) 07/07/2019 0552   CHOLHDL 5.7 07/07/2019 0552   VLDL 36 07/07/2019 0552   LDLCALC 125 (H) 07/07/2019 0552   HgbA1c:  Lab Results  Component Value Date   HGBA1C 6.5 (H) 07/07/2019   Urine Drug Screen:     Component Value Date/Time   LABOPIA NONE DETECTED 07/07/2019 0522   COCAINSCRNUR NONE DETECTED 07/07/2019 0522   LABBENZ NONE DETECTED 07/07/2019 0522   AMPHETMU NONE DETECTED 07/07/2019 0522   THCU NONE DETECTED 07/07/2019 0522   LABBARB NONE DETECTED 07/07/2019 0522    Alcohol Level No results found for: [place]  IMAGING CT Code Stroke CTA Head W/WO contrast  Result Date: 07/07/2019 CLINICAL DATA:  Right arm weakness EXAM: CT ANGIOGRAPHY HEAD AND NECK TECHNIQUE: Multidetector CT imaging of the head and neck was performed using the standard protocol during bolus administration of intravenous contrast. Multiplanar CT image reconstructions and MIPs were obtained to evaluate the vascular anatomy. Carotid stenosis measurements (when applicable) are obtained utilizing NASCET  criteria, using the distal internal carotid diameter as the denominator. CONTRAST:  35mL OMNIPAQUE IOHEXOL 350 MG/ML SOLN COMPARISON:  Brain MRI 12/06/2013 FINDINGS: CTA NECK FINDINGS Aortic arch: Unremarkable.  Three vessel branching. Right carotid system: Mixed density atheromatous plaque at the bifurcation without flow limiting stenosis or ulceration. Left carotid system: Mild low-density atheromatous plaque at the common carotid bifurcation without stenosis or ulceration. There are additional plaques in the distal common carotid and distal left ICA which are also not flow reducing. Vertebral arteries: Proximal subclavian atherosclerosis without flow limiting stenosis. There is a severe stenosis at the proximal aspect of the non dominant right vertebral artery with relative underfilling. The dominant left vertebral artery is widely patent to the dura. Skeleton: Spinal degeneration with multilevel anterolisthesis and C6-7 ACDF. No acute finding. Other neck: Negative Upper chest: Mild emphysema Review of the MIP images confirms the above findings CTA HEAD FINDINGS Anterior circulation: Bilateral carotid siphon atherosclerotic calcification. Notable plaque the left M1 segment with 50% stenosis as estimated on coronal MIPS. No detectable branch occlusion. Negative for aneurysm. Posterior circulation: Strong left vertebral artery dominance with less intense flow in the proximally stenotic right vertebral artery. The  right vertebral artery and PICA are patent. The basilar is smooth and widely patent. Symmetric robust flow in the posterior cerebral arteries. Venous sinuses: Unremarkable in the arterial phase. Strong dominance of the left transverse sigmoid system Anatomic variants: None significant Review of the MIP images confirms the above findings IMPRESSION: 1. No emergent finding. 2. Severe stenosis of the non dominant right V1 segment with downstream underfilling. 3. Cervical carotid atherosclerosis without  ulceration or flow reducing stenosis. 4. ~50% left M1 segment stenosis. Electronically Signed   By: Monte Fantasia M.D.   On: 07/07/2019 06:10   DG Chest 2 View  Result Date: 07/07/2019 CLINICAL DATA:  TIA symptoms EXAM: CHEST - 2 VIEW COMPARISON:  April 25, 2019 FINDINGS: There is no edema or consolidation. Heart size and pulmonary vascularity are within normal limits. No adenopathy. Postoperative changes noted in the right shoulder and lower cervical regions. There is an old healed fracture of the left clavicle. There are foci aortic atherosclerosis. IMPRESSION: No edema or consolidation. Cardiac silhouette within normal limits. Aortic Atherosclerosis (ICD10-I70.0). Electronically Signed   By: Lowella Grip III M.D.   On: 07/07/2019 07:57   CT HEAD WO CONTRAST  Result Date: 07/07/2019 CLINICAL DATA:  Acute neuro deficit, stroke suspected EXAM: CT HEAD WITHOUT CONTRAST TECHNIQUE: Contiguous axial images were obtained from the base of the skull through the vertex without intravenous contrast. COMPARISON:  Same-day MRI and CT angiography FINDINGS: Brain: No evidence of acute infarction, hemorrhage, hydrocephalus, extra-axial collection or mass lesion/mass effect. Patchy areas of white matter hypoattenuation are most compatible with chronic microvascular angiopathy. Slight prominence of the ventricles, cisterns and sulci compatible with mild frontal predominant parenchymal volume loss. Vascular: Atherosclerotic calcification of the carotid siphons and intradural vertebral arteries. No hyperdense vessel. Skull: No calvarial fracture or suspicious osseous lesion. No scalp swelling or hematoma. Sinuses/Orbits: Paranasal sinuses and mastoid air cells are predominantly clear. Orbital structures are unremarkable aside from prior lens extractions. Other: None IMPRESSION: No acute interval change from comparison CT and MRI performed 12 hours prior. Electronically Signed   By: Lovena Le M.D.   On: 07/07/2019  17:55   CT Code Stroke CTA Neck W/WO contrast  Result Date: 07/07/2019 CLINICAL DATA:  Right arm weakness EXAM: CT ANGIOGRAPHY HEAD AND NECK TECHNIQUE: Multidetector CT imaging of the head and neck was performed using the standard protocol during bolus administration of intravenous contrast. Multiplanar CT image reconstructions and MIPs were obtained to evaluate the vascular anatomy. Carotid stenosis measurements (when applicable) are obtained utilizing NASCET criteria, using the distal internal carotid diameter as the denominator. CONTRAST:  39mL OMNIPAQUE IOHEXOL 350 MG/ML SOLN COMPARISON:  Brain MRI 12/06/2013 FINDINGS: CTA NECK FINDINGS Aortic arch: Unremarkable.  Three vessel branching. Right carotid system: Mixed density atheromatous plaque at the bifurcation without flow limiting stenosis or ulceration. Left carotid system: Mild low-density atheromatous plaque at the common carotid bifurcation without stenosis or ulceration. There are additional plaques in the distal common carotid and distal left ICA which are also not flow reducing. Vertebral arteries: Proximal subclavian atherosclerosis without flow limiting stenosis. There is a severe stenosis at the proximal aspect of the non dominant right vertebral artery with relative underfilling. The dominant left vertebral artery is widely patent to the dura. Skeleton: Spinal degeneration with multilevel anterolisthesis and C6-7 ACDF. No acute finding. Other neck: Negative Upper chest: Mild emphysema Review of the MIP images confirms the above findings CTA HEAD FINDINGS Anterior circulation: Bilateral carotid siphon atherosclerotic calcification. Notable plaque the left M1  segment with 50% stenosis as estimated on coronal MIPS. No detectable branch occlusion. Negative for aneurysm. Posterior circulation: Strong left vertebral artery dominance with less intense flow in the proximally stenotic right vertebral artery. The right vertebral artery and PICA are  patent. The basilar is smooth and widely patent. Symmetric robust flow in the posterior cerebral arteries. Venous sinuses: Unremarkable in the arterial phase. Strong dominance of the left transverse sigmoid system Anatomic variants: None significant Review of the MIP images confirms the above findings IMPRESSION: 1. No emergent finding. 2. Severe stenosis of the non dominant right V1 segment with downstream underfilling. 3. Cervical carotid atherosclerosis without ulceration or flow reducing stenosis. 4. ~50% left M1 segment stenosis. Electronically Signed   By: Monte Fantasia M.D.   On: 07/07/2019 06:10   MR BRAIN WO CONTRAST  Result Date: 07/07/2019 CLINICAL DATA:  Right sided weakness. EXAM: MRI HEAD WITHOUT CONTRAST TECHNIQUE: Multiplanar, multiecho pulse sequences of the brain and surrounding structures were obtained without intravenous contrast. COMPARISON:  MRI and head CT earlier same day. FINDINGS: Brain: Diffusion imaging only as performed. This is unchanged from earlier today without evidence of interval acute infarction or other change. IMPRESSION: Repeat diffusion imaging does not show any change since earlier today. No acute insult. Electronically Signed   By: Nelson Chimes M.D.   On: 07/07/2019 19:25   MR BRAIN WO CONTRAST  Result Date: 07/07/2019 CLINICAL DATA:  TIA. Right arm weakness. EXAM: MRI HEAD WITHOUT CONTRAST TECHNIQUE: Multiplanar, multiecho pulse sequences of the brain and surrounding structures were obtained without intravenous contrast. COMPARISON:  Head CT 07/07/2019 and MRI 12/06/2013 FINDINGS: Brain: There is no evidence of acute infarct, intracranial hemorrhage, mass, midline shift, or extra-axial fluid collection. Mild cerebral atrophy is within normal limits for age. T2 hyperintensities in the cerebral white matter, primarily in the periventricular white matter, are unchanged from the prior MRI and nonspecific but compatible with mild chronic small vessel ischemic  disease. Vascular: Major intracranial vascular flow voids are preserved. Skull and upper cervical spine: Unremarkable bone marrow signal. Sinuses/Orbits: Bilateral cataract extraction. Minimal mucosal thickening in the ethmoid air cells bilaterally. Clear mastoid air cells. Other: None. IMPRESSION: 1. No acute intracranial abnormality. 2. Mild chronic small vessel ischemic disease. Electronically Signed   By: Logan Bores M.D.   On: 07/07/2019 09:01   EEG adult  Result Date: 07/08/2019 Lora Havens, MD     07/08/2019 10:26 AM Patient Name: George Watson MRN: [id number] Epilepsy Attending: Lora Havens Referring Physician/Provider: Dr Fuller Plan Date: 07/08/2019 Duration: 25.52 mins Patient history: 73yo M with right sided weakness. EEG to evaluate for seizure Level of alertness: awake, drowsy AEDs during EEG study: None Technical aspects: This EEG study was done with scalp electrodes positioned according to the 10-20 International system of electrode placement. Electrical activity was acquired at a sampling rate of 500Hz  and reviewed with a high frequency filter of 70Hz  and a low frequency filter of 1Hz . EEG data were recorded continuously and digitally stored. Description: The posterior dominant rhythm consists of 10 Hz activity of moderate voltage (25-35 uV) seen predominantly in posterior head regions, symmetric and reactive to eye opening and eye closing.     Drowsiness was characterized by attenuation of the posterior background rhythm.  Physiologic photic driving was seen during photic stimulation.  Hyperventilation was not performed. IMPRESSION: This study is within normal limits. No seizures or epileptiform discharges were seen throughout the recording. [place]   ECHOCARDIOGRAM COMPLETE  Result  Date: 07/07/2019   ECHOCARDIOGRAM REPORT   Patient Name:   George Watson Date of Exam: 07/07/2019 Medical Rec #:  [id number]      Height:       71.0 in Accession #:    [id number]      Weight:       175.0 lb Date of Birth:  April 15, 1946      BSA:          1.99 m Patient Age:    73 years       BP:           138/92 mmHg Patient Gender: M              HR:           69 bpm. Exam Location:  Inpatient Procedure: 2D Echo, Cardiac Doppler and Color Doppler Indications:    TIA 435.9/G45.9  History:        Patient has no prior history of Echocardiogram examinations.                 COPD; Risk Factors:Hypertension, Dyslipidemia and GERD. Cancer.                 Substance abuse.  Sonographer:    Tiffany Dance Referring Phys: [id number] RONDELL A SMITH IMPRESSIONS  1. Left ventricular ejection fraction, by visual estimation, is 55 to 60%. The left ventricle has normal function. There is mildly increased left ventricular hypertrophy.  2. Left ventricular diastolic parameters are consistent with Grade I diastolic dysfunction (impaired relaxation).  3. The left ventricle has no regional wall motion abnormalities.  4. Global right ventricle has normal systolic function.The right ventricular size is normal. No increase in right ventricular wall thickness.  5. Left atrial size was normal.  6. Right atrial size was normal.  7. The mitral valve is abnormal. Trivial mitral valve regurgitation.  8. The tricuspid valve is grossly normal. Tricuspid valve regurgitation is trivial.  9. The aortic valve is tricuspid. Aortic valve regurgitation is not visualized. 10. The pulmonic valve was grossly normal. Pulmonic valve regurgitation is trivial. 11. The inferior vena cava is normal in size with greater than 50% respiratory variability, suggesting right atrial pressure of 3 mmHg. FINDINGS  Left Ventricle: Left ventricular ejection fraction, by visual estimation, is 55 to 60%. The left ventricle has normal function. The left ventricle has no regional wall motion abnormalities. There is mildly increased left ventricular hypertrophy. Left ventricular diastolic parameters are consistent with Grade I diastolic dysfunction (impaired  relaxation). Normal left atrial pressure. Right Ventricle: The right ventricular size is normal. No increase in right ventricular wall thickness. Global RV systolic function is has normal systolic function. Left Atrium: Left atrial size was normal in size. Right Atrium: Right atrial size was normal in size Pericardium: There is no evidence of pericardial effusion. Mitral Valve: The mitral valve is abnormal. There is mild thickening of the mitral valve leaflet(s). Trivial mitral valve regurgitation. Tricuspid Valve: The tricuspid valve is grossly normal. Tricuspid valve regurgitation is trivial. Aortic Valve: The aortic valve is tricuspid. Aortic valve regurgitation is not visualized. Pulmonic Valve: The pulmonic valve was grossly normal. Pulmonic valve regurgitation is trivial. Pulmonic regurgitation is trivial. Aorta: The aortic root and ascending aorta are structurally normal, with no evidence of dilitation. Venous: The inferior vena cava is normal in size with greater than 50% respiratory variability, suggesting right atrial pressure of 3 mmHg. IAS/Shunts: No atrial level shunt detected by color flow Doppler.  LEFT  VENTRICLE PLAX 2D LVIDd:         5.24 cm  Diastology LVIDs:         3.57 cm  LV e' lateral:   7.29 cm/s LV PW:         1.13 cm  LV E/e' lateral: 5.5 LV IVS:        1.03 cm  LV e' medial:    4.68 cm/s LVOT diam:     2.10 cm  LV E/e' medial:  8.6 LV SV:         78 ml LV SV Index:   39.25 LVOT Area:     3.46 cm  RIGHT VENTRICLE             IVC RV Basal diam:  2.46 cm     IVC diam: 1.94 cm RV S prime:     14.60 cm/s TAPSE (M-mode): 2.3 cm LEFT ATRIUM             Index       RIGHT ATRIUM           Index LA diam:        3.80 cm 1.91 cm/m  RA Area:     12.20 cm LA Vol (A2C):   97.1 ml 48.74 ml/m RA Volume:   22.00 ml  11.04 ml/m LA Vol (A4C):   35.2 ml 17.67 ml/m LA Biplane Vol: 64.4 ml 32.33 ml/m  AORTIC VALVE LVOT Vmax:   86.60 cm/s LVOT Vmean:  56.000 cm/s LVOT VTI:    0.182 m  AORTA Ao Root diam:  3.10 cm Ao Asc diam:  3.70 cm MITRAL VALVE MV Area (PHT): 2.29 cm             SHUNTS MV PHT:        95.99 msec           Systemic VTI:  0.18 m MV Decel Time: 331 msec             Systemic Diam: 2.10 cm MV E velocity: 40.30 cm/s 103 cm/s MV A velocity: 70.30 cm/s 70.3 cm/s MV E/A ratio:  0.57       1.5  Lyman Bishop MD Electronically signed by Lyman Bishop MD Signature Date/Time: 07/07/2019/2:50:25 PM    Final    CT HEAD CODE STROKE WO CONTRAST  Result Date: 07/07/2019 CLINICAL DATA:  Code stroke.  Right arm weakness EXAM: CT HEAD WITHOUT CONTRAST TECHNIQUE: Contiguous axial images were obtained from the base of the skull through the vertex without intravenous contrast. COMPARISON:  Brain MRI 12/06/2013 FINDINGS: Brain: No evidence of acute infarction, hemorrhage, hydrocephalus, extra-axial collection or mass lesion/mass effect. Vascular: No hyperdense vessel or unexpected calcification. Skull: Normal. Negative for fracture or focal lesion. Sinuses/Orbits: No acute finding. Other: These results were communicated to Dr. Rory Percy at [place] 12/17/2020by text page via the [facility] messaging system. ASPECTS [facility] Stroke Program Early CT Score) - Ganglionic level infarction (caudate, lentiform nuclei, internal capsule, insula, M1-M3 cortex): 7 - Supraganglionic infarction (M4-M6 cortex): 3 Total score (0-10 with 10 being normal): 10 IMPRESSION: Negative head CT.  ASPECTS is 10. Electronically Signed   By: Monte Fantasia M.D.   On: 07/07/2019 05:35    PHYSICAL EXAM   Temp:  [97.8 F (36.6 C)] 97.8 F (36.6 C) (12/18 0752) Pulse Rate:  [67-75] 70 (12/18 0752) Resp:  [14-19] 16 (12/17 2314) BP: (135-164)/(74-93) 135/87 (12/18 0752) SpO2:  [95 %-99 %] 95 % (12/18 0752) Weight:  [76.2 kg]  76.2 kg (12/17 2030)  General - Well nourished, well developed, in no apparent distress.  Ophthalmologic - fundi not visualized due to noncooperation.  Cardiovascular - Regular rhythm and rate.  Mental Status -   Level of arousal and orientation to time, place, and person were intact. Language including expression, naming, repetition, comprehension was assessed and found intact. Attention span and concentration were normal. Fund of Knowledge was assessed and was intact.  Cranial Nerves II - XII - II - Visual field intact OU. III, IV, VI - Extraocular movements intact. V - Facial sensation intact bilaterally. VII - Facial movement intact bilaterally. VIII - Hearing & vestibular intact bilaterally. X - Palate elevates symmetrically. XI - Chin turning & shoulder shrug intact bilaterally. XII - Tongue protrusion intact.  Motor Strength - The patient's strength was normal in all extremities except chronic right rotator cuff injury so the RUE proximal 3/5, however finger grip on the right strong.  Bulk was normal and fasciculations were absent.   Motor Tone - Muscle tone was assessed at the neck and appendages and was normal.  Reflexes - The patient's reflexes were symmetrical in all extremities and he had no pathological reflexes.  Sensory - Light touch, temperature/pinprick were assessed and were symmetrical.    Coordination - The patient had normal movements in the left hand and left leg with no ataxia or dysmetria. Slight dysmetria on the right hand FTN and leg HTS (?? Anxiety). Tremor was absent.  Gait and Station - able to walk without assistance in CT room. He felt he walks fine.   ASSESSMENT/PLAN George Watson is a 73 y.o. male with history of COPD, hypertension, polysubstance abuse in the past, hepatitis C, hypercholesterolemia, prostate cancer presenting with transient R arm weakness. Recurrent R sided weakness and dysmetria in hospital, no tPA as too mild to treat.  TIA x 2 - could be related to left M1 50% stenosis.  Code Stroke CT head No acute abnormality. ASPECTS 10.     CTA head & neck no ELVO. Severe stenosis RV1. Cervical right ICA atherosclerosis. L M1 ~50%  stenosis.  MRI  No acute abnormality   Repeat MRI neg for acute abnormality    2D Echo EF 55 to 60%  EEG after recurrent symptoms negative for Sz  LDL 125  HgbA1c 6.5  UDS neg  Lovenox 40 mg sq daily for VTE prophylaxis  aspirin 81 mg daily 3 times per week prior to admission, now on aspirin 325 mg daily. Loaded with plavix and statin w/ recurrent sx. Continue aspirin 81 and Plavix 75 for 3 weeks and then aspirin alone.  Therapy recommendations:  No therapy needs  Disposition:  Return home  Hypertension  Stable . BP goal normotensive  Hyperlipidemia  Home meds:  crestor 10 3 days a week and lovaza  Now on crestor 20 daily  LDL 125, goal < 70  Loaded with crestor 40 at symptom recurrence  Hx of intolerance of lipitor  Continue statin at discharge  Pre-Diabetes  HgbA1c 6.5, goal < 7.0  CBGs  SSI  PCP follow up  Other Stroke Risk Factors  Advanced age  Former Cigarette smoker  Other Active Problems  [facility] day # 0  Neurology will sign off. Please call with questions. Pt will follow up with stroke clinic NP at [facility] in about 4 weeks. Thanks for the consult.   Rosalin Hawking, MD PhD Stroke Neurology 07/08/2019 10:45 AM  To contact Stroke Continuity provider, please  refer to [email or web address] After hours, contact General Neurology

## 2019-07-08 NOTE — Progress Notes (Signed)
SLP Cancellation Note  Patient Details Name: ARELI PINUELAS MRN: [id number] DOB: 22-May-1946   Cancelled treatment:       Reason Eval/Treat Not Completed: SLP screened, no needs identified, will sign off   Asiah Browder, Katherene Ponto 07/08/2019, 2:01 PM

## 2019-07-08 NOTE — Discharge Summary (Signed)
Physician Discharge Summary  George Watson [id number] DOB: 10/13/1945 DOA: 07/07/2019  PCP: [name], MD  Admit date: 07/07/2019 Discharge date: 07/08/2019  Time spent: 45 minutes  Recommendations for Outpatient Follow-up:  1. Follow up with neuro 4-6 weeks   Discharge Diagnoses:  Principal Problem:   TIA (transient ischemic attack) Active Problems:   Hepatitis C, chronic (HCC)   Essential hypertension, benign   Hyperlipidemia with target LDL less than 130   Prostate cancer [facility])   Discharge Condition: stable  Diet recommendation: heart healthy  Filed Weights   07/07/19 2030  Weight: 76.2 kg    History of present illness:  George Watson is a 73 y.o. male with medical history significant of hypertension, COPD/emphysema, BPH, treated hepatitis C, prostate cancer, remote tobacco use, and remote alcohol abuse.  He presented 12/17 with acute onset of right upper and lower leg weakness around 3 AM.  Patient was last noted to be normal at around 11 PM when he went to sleep.  He reported being unable to stand on the right leg with inability to control his right arm.  Symptoms lasted approximately 1 hour and started to resolve while in the ambulance.  Reported having similar symptoms occur on October 21, but reported only lasted 20 minutes at that time.  Denied having any change in speech, facial droop, headache, change in vision, fever, chills, nausea, or vomiting.  He reports chronically having lower back pain and wheezing due to his history of emphysema.  He reported that his breathing is at his baseline.  He quit smoking over 16 years ago and has not picked up drink in over 30 years  Hospital Course:  TIA (transient ischemic attack):Acute. Patient presented with complaints of right arm incoordination with weakness and right leg weakness. CT scan of the brain negative for any acute abnormalities. CTA head and neck no ELVO, sever stenosis RV!, cervical right ICA  atherosclerosis L M1 505 stenosis, MRI no acute abnormality, repeat MRI no change, eco with EF 55%, EEG negative, LDL 125, HgA1c 6.5, UDS negative. Evaluated by neuro who recommend plavix and asa for 3 weeks then asa alone.  OP follow up 4-6 weeks  Essential hypertension at home patient on medications of olmesartan 40 mg daily. Fair control.   Prostate cancer, BPH: Patient reported having history of prostate cancer with this last PSA noted to be 1.Currently under active surveillance on Avodart.  Hyperlipidemia: Patient's home medication regimen includes crestor increased to 20mg   Hepatitis C:Patient reports being status post treatment by Dr. Altamease Oiler gastroenterology at [facility].  History of polysubstance abuse: Patient reports that he no longer uses tobacco or alcohol or illicit drugs.  Procedures:  Consultations:  [facility] neurology  Discharge Exam: Vitals:   07/07/19 2314 07/08/19 0752  BP: 138/74 135/87  Pulse: 67 70  Resp: 16   Temp: 97.8 F (36.6 C) 97.8 F (36.6 C)  SpO2: 99% 95%    General: awake alert no acute distress Cardiovascular: rrr no mgr no LE edema Respiratory: normal effort BS clear bilaterally no wheeze  Discharge Instructions   Discharge Instructions    Ambulatory referral to Neurology   Complete by: As directed    Follow up with stroke clinic NP (Jessica Vanschaick or Cecille Rubin, if both not available, consider Zachery Dauer, or Ahern) at [facility] in about 4 weeks. Thanks.   Call MD for:  persistant dizziness or light-headedness   Complete by: As directed    Call MD for:  temperature >100.4   Complete by: As directed    Diet - low sodium heart healthy   Complete by: As directed    Discharge instructions   Complete by: As directed    plavix and aspirin for 3 weeks then aspirin only.   Increase activity slowly   Complete by: As directed      Allergies as of 07/08/2019   No Known Allergies     Medication List     STOP taking these medications   aspirin 81 MG chewable tablet Replaced by: aspirin 81 MG EC tablet     TAKE these medications   albuterol 108 (90 Base) MCG/ACT inhaler Commonly known as: VENTOLIN HFA INHALE 2 PUFFS INTO THE LUNGS EVERY 6 (SIX) HOURS AS NEEDED FOR SHORTNESS OF BREATH.   Anoro Ellipta 62.5-25 MCG/INH Aepb Generic drug: umeclidinium-vilanterol INHALE 1 PUFF BY MOUTH EVERY DAY What changed: See the new instructions.   aspirin 81 MG EC tablet Take 1 tablet (81 mg total) by mouth daily. Start taking on: July 09, 2019 Replaces: aspirin 81 MG chewable tablet   CENTRUM SILVER PO Take by mouth.   cholecalciferol 25 MCG (1000 UT) tablet Commonly known as: VITAMIN D3 Take 1,000 Units by mouth daily.   clopidogrel 75 MG tablet Commonly known as: PLAVIX Take 1 tablet (75 mg total) by mouth daily for 21 days. Start taking on: July 09, 2019   Coenzyme Q10-Vitamin E 100-300 MG-UNIT Chew Chew by mouth.   dutasteride 0.5 MG capsule Commonly known as: AVODART Take 0.5 mg by mouth daily.   glucosamine-chondroitin 500-400 MG tablet Take 3 tablets by mouth daily. What changed: how much to take   HM Famotidine 20 MG tablet Generic drug: famotidine Take 20 mg by mouth every evening.   hydrocortisone cream 0.5 % Apply 1 application topically daily as needed for itching (on eyelids).   olmesartan 40 MG tablet Commonly known as: Benicar Take 1 tablet (40 mg total) by mouth daily.   omega-3 acid ethyl esters 1 g capsule Commonly known as: LOVAZA Take 2 capsules (2 g total) by mouth 2 (two) times daily.   rosuvastatin 20 MG tablet Commonly known as: CRESTOR Take 1 tablet (20 mg total) by mouth daily at 6 PM. What changed:   medication strength  how much to take  when to take this   tamsulosin 0.4 MG Caps capsule Commonly known as: FLOMAX Take 0.4 mg by mouth daily.      No Known Allergies Follow-up Information    [facility]. Schedule an appointment as soon as possible for a visit in 4 week(s).   Specialty: Neurology Contact information: [address] [place] [phone]           The results of significant diagnostics from this hospitalization (including imaging, microbiology, ancillary and laboratory) are listed below for reference.    Significant Diagnostic Studies: CT Code Stroke CTA Head W/WO contrast  Result Date: 07/07/2019 CLINICAL DATA:  Right arm weakness EXAM: CT ANGIOGRAPHY HEAD AND NECK TECHNIQUE: Multidetector CT imaging of the head and neck was performed using the standard protocol during bolus administration of intravenous contrast. Multiplanar CT image reconstructions and MIPs were obtained to evaluate the vascular anatomy. Carotid stenosis measurements (when applicable) are obtained utilizing NASCET criteria, using the distal internal carotid diameter as the denominator. CONTRAST:  34mL OMNIPAQUE IOHEXOL 350 MG/ML SOLN COMPARISON:  Brain MRI 12/06/2013 FINDINGS: CTA NECK FINDINGS Aortic arch: Unremarkable.  Three vessel branching.  Right carotid system: Mixed density atheromatous plaque at the bifurcation without flow limiting stenosis or ulceration. Left carotid system: Mild low-density atheromatous plaque at the common carotid bifurcation without stenosis or ulceration. There are additional plaques in the distal common carotid and distal left ICA which are also not flow reducing. Vertebral arteries: Proximal subclavian atherosclerosis without flow limiting stenosis. There is a severe stenosis at the proximal aspect of the non dominant right vertebral artery with relative underfilling. The dominant left vertebral artery is widely patent to the dura. Skeleton: Spinal degeneration with multilevel anterolisthesis and C6-7 ACDF. No acute finding. Other neck: Negative Upper chest: Mild emphysema Review of the MIP images confirms the above findings CTA HEAD  FINDINGS Anterior circulation: Bilateral carotid siphon atherosclerotic calcification. Notable plaque the left M1 segment with 50% stenosis as estimated on coronal MIPS. No detectable branch occlusion. Negative for aneurysm. Posterior circulation: Strong left vertebral artery dominance with less intense flow in the proximally stenotic right vertebral artery. The right vertebral artery and PICA are patent. The basilar is smooth and widely patent. Symmetric robust flow in the posterior cerebral arteries. Venous sinuses: Unremarkable in the arterial phase. Strong dominance of the left transverse sigmoid system Anatomic variants: None significant Review of the MIP images confirms the above findings IMPRESSION: 1. No emergent finding. 2. Severe stenosis of the non dominant right V1 segment with downstream underfilling. 3. Cervical carotid atherosclerosis without ulceration or flow reducing stenosis. 4. ~50% left M1 segment stenosis. Electronically Signed   By: Monte Fantasia M.D.   On: 07/07/2019 06:10   DG Chest 2 View  Result Date: 07/07/2019 CLINICAL DATA:  TIA symptoms EXAM: CHEST - 2 VIEW COMPARISON:  April 25, 2019 FINDINGS: There is no edema or consolidation. Heart size and pulmonary vascularity are within normal limits. No adenopathy. Postoperative changes noted in the right shoulder and lower cervical regions. There is an old healed fracture of the left clavicle. There are foci aortic atherosclerosis. IMPRESSION: No edema or consolidation. Cardiac silhouette within normal limits. Aortic Atherosclerosis (ICD10-I70.0). Electronically Signed   By: Lowella Grip III M.D.   On: 07/07/2019 07:57   CT HEAD WO CONTRAST  Result Date: 07/07/2019 CLINICAL DATA:  Acute neuro deficit, stroke suspected EXAM: CT HEAD WITHOUT CONTRAST TECHNIQUE: Contiguous axial images were obtained from the base of the skull through the vertex without intravenous contrast. COMPARISON:  Same-day MRI and CT angiography FINDINGS:  Brain: No evidence of acute infarction, hemorrhage, hydrocephalus, extra-axial collection or mass lesion/mass effect. Patchy areas of white matter hypoattenuation are most compatible with chronic microvascular angiopathy. Slight prominence of the ventricles, cisterns and sulci compatible with mild frontal predominant parenchymal volume loss. Vascular: Atherosclerotic calcification of the carotid siphons and intradural vertebral arteries. No hyperdense vessel. Skull: No calvarial fracture or suspicious osseous lesion. No scalp swelling or hematoma. Sinuses/Orbits: Paranasal sinuses and mastoid air cells are predominantly clear. Orbital structures are unremarkable aside from prior lens extractions. Other: None IMPRESSION: No acute interval change from comparison CT and MRI performed 12 hours prior. Electronically Signed   By: Lovena Le M.D.   On: 07/07/2019 17:55   CT Code Stroke CTA Neck W/WO contrast  Result Date: 07/07/2019 CLINICAL DATA:  Right arm weakness EXAM: CT ANGIOGRAPHY HEAD AND NECK TECHNIQUE: Multidetector CT imaging of the head and neck was performed using the standard protocol during bolus administration of intravenous contrast. Multiplanar CT image reconstructions and MIPs were obtained to evaluate the vascular anatomy. Carotid stenosis measurements (when applicable) are obtained utilizing  NASCET criteria, using the distal internal carotid diameter as the denominator. CONTRAST:  54mL OMNIPAQUE IOHEXOL 350 MG/ML SOLN COMPARISON:  Brain MRI 12/06/2013 FINDINGS: CTA NECK FINDINGS Aortic arch: Unremarkable.  Three vessel branching. Right carotid system: Mixed density atheromatous plaque at the bifurcation without flow limiting stenosis or ulceration. Left carotid system: Mild low-density atheromatous plaque at the common carotid bifurcation without stenosis or ulceration. There are additional plaques in the distal common carotid and distal left ICA which are also not flow reducing. Vertebral  arteries: Proximal subclavian atherosclerosis without flow limiting stenosis. There is a severe stenosis at the proximal aspect of the non dominant right vertebral artery with relative underfilling. The dominant left vertebral artery is widely patent to the dura. Skeleton: Spinal degeneration with multilevel anterolisthesis and C6-7 ACDF. No acute finding. Other neck: Negative Upper chest: Mild emphysema Review of the MIP images confirms the above findings CTA HEAD FINDINGS Anterior circulation: Bilateral carotid siphon atherosclerotic calcification. Notable plaque the left M1 segment with 50% stenosis as estimated on coronal MIPS. No detectable branch occlusion. Negative for aneurysm. Posterior circulation: Strong left vertebral artery dominance with less intense flow in the proximally stenotic right vertebral artery. The right vertebral artery and PICA are patent. The basilar is smooth and widely patent. Symmetric robust flow in the posterior cerebral arteries. Venous sinuses: Unremarkable in the arterial phase. Strong dominance of the left transverse sigmoid system Anatomic variants: None significant Review of the MIP images confirms the above findings IMPRESSION: 1. No emergent finding. 2. Severe stenosis of the non dominant right V1 segment with downstream underfilling. 3. Cervical carotid atherosclerosis without ulceration or flow reducing stenosis. 4. ~50% left M1 segment stenosis. Electronically Signed   By: Monte Fantasia M.D.   On: 07/07/2019 06:10   MR BRAIN WO CONTRAST  Result Date: 07/07/2019 CLINICAL DATA:  Right sided weakness. EXAM: MRI HEAD WITHOUT CONTRAST TECHNIQUE: Multiplanar, multiecho pulse sequences of the brain and surrounding structures were obtained without intravenous contrast. COMPARISON:  MRI and head CT earlier same day. FINDINGS: Brain: Diffusion imaging only as performed. This is unchanged from earlier today without evidence of interval acute infarction or other change.  IMPRESSION: Repeat diffusion imaging does not show any change since earlier today. No acute insult. Electronically Signed   By: Nelson Chimes M.D.   On: 07/07/2019 19:25   MR BRAIN WO CONTRAST  Result Date: 07/07/2019 CLINICAL DATA:  TIA. Right arm weakness. EXAM: MRI HEAD WITHOUT CONTRAST TECHNIQUE: Multiplanar, multiecho pulse sequences of the brain and surrounding structures were obtained without intravenous contrast. COMPARISON:  Head CT 07/07/2019 and MRI 12/06/2013 FINDINGS: Brain: There is no evidence of acute infarct, intracranial hemorrhage, mass, midline shift, or extra-axial fluid collection. Mild cerebral atrophy is within normal limits for age. T2 hyperintensities in the cerebral white matter, primarily in the periventricular white matter, are unchanged from the prior MRI and nonspecific but compatible with mild chronic small vessel ischemic disease. Vascular: Major intracranial vascular flow voids are preserved. Skull and upper cervical spine: Unremarkable bone marrow signal. Sinuses/Orbits: Bilateral cataract extraction. Minimal mucosal thickening in the ethmoid air cells bilaterally. Clear mastoid air cells. Other: None. IMPRESSION: 1. No acute intracranial abnormality. 2. Mild chronic small vessel ischemic disease. Electronically Signed   By: Logan Bores M.D.   On: 07/07/2019 09:01   EEG adult  Result Date: 07/08/2019 Lora Havens, MD     07/08/2019 10:26 AM Patient Name: MARTINUS ESTEVES MRN: [id number] Epilepsy Attending: Lora Havens Referring Physician/Provider: Dr  Fuller Plan Date: 07/08/2019 Duration: 25.52 mins Patient history: 73yo M with right sided weakness. EEG to evaluate for seizure Level of alertness: awake, drowsy AEDs during EEG study: None Technical aspects: This EEG study was done with scalp electrodes positioned according to the 10-20 International system of electrode placement. Electrical activity was acquired at a sampling rate of 500Hz  and reviewed with a high  frequency filter of 70Hz  and a low frequency filter of 1Hz . EEG data were recorded continuously and digitally stored. Description: The posterior dominant rhythm consists of 10 Hz activity of moderate voltage (25-35 uV) seen predominantly in posterior head regions, symmetric and reactive to eye opening and eye closing.     Drowsiness was characterized by attenuation of the posterior background rhythm.  Physiologic photic driving was seen during photic stimulation.  Hyperventilation was not performed. IMPRESSION: This study is within normal limits. No seizures or epileptiform discharges were seen throughout the recording. Lora Havens   ECHOCARDIOGRAM COMPLETE  Result Date: 07/07/2019   ECHOCARDIOGRAM REPORT   Patient Name:   SABURO TEAFF Date of Exam: 07/07/2019 Medical Rec #:  [id number]      Height:       71.0 in Accession #:    [id number]     Weight:       175.0 lb Date of Birth:  05-17-1946      BSA:          1.99 m Patient Age:    32 years       BP:           138/92 mmHg Patient Gender: M              HR:           69 bpm. Exam Location:  Inpatient Procedure: 2D Echo, Cardiac Doppler and Color Doppler Indications:    TIA 435.9/G45.9  History:        Patient has no prior history of Echocardiogram examinations.                 COPD; Risk Factors:Hypertension, Dyslipidemia and GERD. Cancer.                 Substance abuse.  Sonographer:    Tiffany Dance Referring Phys: [id number] RONDELL A SMITH IMPRESSIONS  1. Left ventricular ejection fraction, by visual estimation, is 55 to 60%. The left ventricle has normal function. There is mildly increased left ventricular hypertrophy.  2. Left ventricular diastolic parameters are consistent with Grade I diastolic dysfunction (impaired relaxation).  3. The left ventricle has no regional wall motion abnormalities.  4. Global right ventricle has normal systolic function.The right ventricular size is normal. No increase in right ventricular wall thickness.  5. Left  atrial size was normal.  6. Right atrial size was normal.  7. The mitral valve is abnormal. Trivial mitral valve regurgitation.  8. The tricuspid valve is grossly normal. Tricuspid valve regurgitation is trivial.  9. The aortic valve is tricuspid. Aortic valve regurgitation is not visualized. 10. The pulmonic valve was grossly normal. Pulmonic valve regurgitation is trivial. 11. The inferior vena cava is normal in size with greater than 50% respiratory variability, suggesting right atrial pressure of 3 mmHg. FINDINGS  Left Ventricle: Left ventricular ejection fraction, by visual estimation, is 55 to 60%. The left ventricle has normal function. The left ventricle has no regional wall motion abnormalities. There is mildly increased left ventricular hypertrophy. Left ventricular diastolic parameters are consistent with Grade I diastolic dysfunction (  impaired relaxation). Normal left atrial pressure. Right Ventricle: The right ventricular size is normal. No increase in right ventricular wall thickness. Global RV systolic function is has normal systolic function. Left Atrium: Left atrial size was normal in size. Right Atrium: Right atrial size was normal in size Pericardium: There is no evidence of pericardial effusion. Mitral Valve: The mitral valve is abnormal. There is mild thickening of the mitral valve leaflet(s). Trivial mitral valve regurgitation. Tricuspid Valve: The tricuspid valve is grossly normal. Tricuspid valve regurgitation is trivial. Aortic Valve: The aortic valve is tricuspid. Aortic valve regurgitation is not visualized. Pulmonic Valve: The pulmonic valve was grossly normal. Pulmonic valve regurgitation is trivial. Pulmonic regurgitation is trivial. Aorta: The aortic root and ascending aorta are structurally normal, with no evidence of dilitation. Venous: The inferior vena cava is normal in size with greater than 50% respiratory variability, suggesting right atrial pressure of 3 mmHg. IAS/Shunts: No  atrial level shunt detected by color flow Doppler.  LEFT VENTRICLE PLAX 2D LVIDd:         5.24 cm  Diastology LVIDs:         3.57 cm  LV e' lateral:   7.29 cm/s LV PW:         1.13 cm  LV E/e' lateral: 5.5 LV IVS:        1.03 cm  LV e' medial:    4.68 cm/s LVOT diam:     2.10 cm  LV E/e' medial:  8.6 LV SV:         78 ml LV SV Index:   39.25 LVOT Area:     3.46 cm  RIGHT VENTRICLE             IVC RV Basal diam:  2.46 cm     IVC diam: 1.94 cm RV S prime:     14.60 cm/s TAPSE (M-mode): 2.3 cm LEFT ATRIUM             Index       RIGHT ATRIUM           Index LA diam:        3.80 cm 1.91 cm/m  RA Area:     12.20 cm LA Vol (A2C):   97.1 ml 48.74 ml/m RA Volume:   22.00 ml  11.04 ml/m LA Vol (A4C):   35.2 ml 17.67 ml/m LA Biplane Vol: 64.4 ml 32.33 ml/m  AORTIC VALVE LVOT Vmax:   86.60 cm/s LVOT Vmean:  56.000 cm/s LVOT VTI:    0.182 m  AORTA Ao Root diam: 3.10 cm Ao Asc diam:  3.70 cm MITRAL VALVE MV Area (PHT): 2.29 cm             SHUNTS MV PHT:        95.99 msec           Systemic VTI:  0.18 m MV Decel Time: 331 msec             Systemic Diam: 2.10 cm MV E velocity: 40.30 cm/s 103 cm/s MV A velocity: 70.30 cm/s 70.3 cm/s MV E/A ratio:  0.57       1.5  Lyman Bishop MD Electronically signed by Lyman Bishop MD Signature Date/Time: 07/07/2019/2:50:25 PM    Final    CT HEAD CODE STROKE WO CONTRAST  Result Date: 07/07/2019 CLINICAL DATA:  Code stroke.  Right arm weakness EXAM: CT HEAD WITHOUT CONTRAST TECHNIQUE: Contiguous axial images were obtained from the base of the skull through the  vertex without intravenous contrast. COMPARISON:  Brain MRI 12/06/2013 FINDINGS: Brain: No evidence of acute infarction, hemorrhage, hydrocephalus, extra-axial collection or mass lesion/mass effect. Vascular: No hyperdense vessel or unexpected calcification. Skull: Normal. Negative for fracture or focal lesion. Sinuses/Orbits: No acute finding. Other: These results were communicated to Dr. Rory Percy at [place] 12/17/2020by text  page via the [facility] messaging system. ASPECTS [facility] Stroke Program Early CT Score) - Ganglionic level infarction (caudate, lentiform nuclei, internal capsule, insula, M1-M3 cortex): 7 - Supraganglionic infarction (M4-M6 cortex): 3 Total score (0-10 with 10 being normal): 10 IMPRESSION: Negative head CT.  ASPECTS is 10. Electronically Signed   By: Monte Fantasia M.D.   On: 07/07/2019 05:35    Microbiology: Recent Results (from the past 240 hour(s))  SARS CORONAVIRUS 2 (TAT 6-24 HRS) Nasopharyngeal Nasopharyngeal Swab     Status: None   Collection Time: 07/07/19  5:44 AM   Specimen: Nasopharyngeal Swab  Result Value Ref Range Status   SARS Coronavirus 2 NEGATIVE NEGATIVE Final    Comment: (NOTE) SARS-CoV-2 target nucleic acids are NOT DETECTED. The SARS-CoV-2 RNA is generally detectable in upper and lower respiratory specimens during the acute phase of infection. Negative results do not preclude SARS-CoV-2 infection, do not rule out co-infections with other pathogens, and should not be used as the sole basis for treatment or other patient management decisions. Negative results must be combined with clinical observations, patient history, and epidemiological information. The expected result is Negative. Fact Sheet for Patients: SugarRoll.be Fact Sheet for Healthcare Providers: https://www.woods-mathews.com/ This test is not yet approved or cleared by the [place] FDA and  has been authorized for detection and/or diagnosis of SARS-CoV-2 by FDA under an Emergency Use Authorization (EUA). This EUA will remain  in effect (meaning this test can be used) for the duration of the COVID-19 declaration under Section 56 4(b)(1) of the Act, 21 U.S.C. section 360bbb-3(b)(1), unless the authorization is terminated or revoked sooner. Performed at [facility], [place] [address] [place], [place] [address]      Labs: Basic Metabolic Panel: Recent  Labs  Lab 07/07/19 0522 07/07/19 0533  NA 139 140  K 4.0 3.9  CL 106 109  CO2 21*  --   GLUCOSE 121* 118*  BUN 24* 26*  CREATININE 0.82 0.70  CALCIUM 10.0  --    Liver Function Tests: Recent Labs  Lab 07/07/19 0522  AST 23  ALT 24  ALKPHOS 53  BILITOT 0.7  PROT 7.3  ALBUMIN 4.3   No results for input(s): LIPASE, AMYLASE in the last 168 hours. No results for input(s): AMMONIA in the last 168 hours. CBC: Recent Labs  Lab 07/07/19 0522 07/07/19 0533  WBC 9.2  --   NEUTROABS 3.7  --   HGB 16.0 16.0  HCT 48.8 47.0  MCV 94.4  --   PLT 278  --    Cardiac Enzymes: No results for input(s): CKTOTAL, CKMB, CKMBINDEX, TROPONINI in the last 168 hours. BNP: BNP (last 3 results) No results for input(s): BNP in the last 8760 hours.  ProBNP (last 3 results) No results for input(s): PROBNP in the last 8760 hours.  CBG: Recent Labs  Lab 07/07/19 0517  GLUCAP 119*       Signed:  Radene Gunning NP [facility] 07/08/2019, 1:57 PM

## 2019-07-08 NOTE — Progress Notes (Signed)
EEG complete - results pending 

## 2019-07-11 ENCOUNTER — Other Ambulatory Visit: Payer: Self-pay

## 2019-07-11 ENCOUNTER — Encounter: Payer: Self-pay | Admitting: Family Medicine

## 2019-07-11 ENCOUNTER — Ambulatory Visit
Admission: RE | Admit: 2019-07-11 | Discharge: 2019-07-11 | Disposition: A | Discharge status: Home / Self Care | Payer: PPO | Source: Ambulatory Visit | Attending: Family Medicine | Admitting: Family Medicine

## 2019-07-11 DIAGNOSIS — I712 Thoracic aortic aneurysm, without rupture: Secondary | ICD-10-CM | POA: Diagnosis not present

## 2019-07-11 DIAGNOSIS — I77810 Thoracic aortic ectasia: Secondary | ICD-10-CM

## 2019-07-11 MED ORDER — IOPAMIDOL (ISOVUE-370) INJECTION 76%
100.0000 mL | Freq: Once | INTRAVENOUS | Status: AC | PRN
  Administered 2019-07-11: 100 mL via INTRAVENOUS

## 2019-07-13 ENCOUNTER — Encounter: Payer: Self-pay | Admitting: Surgery

## 2019-07-13 ENCOUNTER — Institutional Professional Consult (permissible substitution): Payer: PPO | Admitting: Surgery

## 2019-07-13 ENCOUNTER — Other Ambulatory Visit: Payer: Self-pay

## 2019-07-13 VITALS — BP 141/77 | HR 65 | Temp 97.7°F | Resp 16 | Ht 71.0 in | Wt 170.0 lb

## 2019-07-13 DIAGNOSIS — I77810 Thoracic aortic ectasia: Secondary | ICD-10-CM | POA: Diagnosis not present

## 2019-07-13 NOTE — Progress Notes (Signed)
Cardiothoracic Surgery Consultation   PCP is [name], MD Referring Provider is [name], MD  Chief Complaint  Patient presents with  . Thoracic Aortic Aneurysm    new eval with CTA CHEST 07/08/19 and ECHO 07/07/19    HPI:  The patient is a 73 year old gentleman with history of hypertension, remote smoking and COPD, remote multisubstance abuse, treated hepatitis C, prostate cancer, and known aortic aneurysm dating back to 2004 when the ascending aorta was measured at 3.9 cm.  This has been followed intermittently over the years with CT of the chest.  A CT of the chest in January 2017 showed the ascending aorta to measure 4.1 cm.  He presented to the hospital on 07/07/2019 with right upper and lower extremity weakness starting around 3 AM in the morning.  The symptoms lasted about 1 hour and started to resolve while in ambulance.  CT and MRI of the brain showed no evidence of stroke.  He had had a prior episode in October 2020 which was exactly the same.  It was felt that these were TIAs.  He had a 2D echocardiogram that showed no intracardiac thrombus.  Left ventricular systolic function was normal.  The aortic valve was trileaflet without stenosis or insufficiency.  CT angio of the head neck was only significant for severe stenosis of a nondominant right V1 segment with downstream underfilling.  There is about 50% left M1 segment stenosis.  There is cervical carotid atherosclerosis without ulceration or flow reducing stenosis.  His symptoms completely recovered and he was sent home on aspirin and Plavix.  While in the hospital he had a repeat CTA of the chest to follow-up on his ascending aorta and the aortic root was measured at 4.4 cm.  The remainder of the ascending thoracic aorta measured 3.7 cm.  There is no family history of aortic aneurysm, aortic dissection, or connective tissue disorder.  Past Medical History:  Diagnosis Date  . BPH (benign prostatic hypertrophy)   .  COPD (chronic obstructive pulmonary disease) ([place])   . DDD (degenerative disc disease), lumbosacral   . Elevated PSA   . Essential hypertension, benign    diet controlled  . Fracture of shaft of clavicle   . GERD (gastroesophageal reflux disease)   . H/O drug abuse ([place])    multisubstance  . Hepatitis C 10/1996  . HSV-2 (herpes simplex virus 2) infection   . Hx of biopsy 08/2004   liver  . Inguinal hernia    right  . Other and unspecified hyperlipidemia   . Prostate cancer ([place])   . Substance abuse ([place])   . Tuberculosis     Past Surgical History:  Procedure Laterality Date  . CERVICAL SPINE SURGERY    . fracture ribs     3  . HERNIA REPAIR    . INGUINAL HERNIA REPAIR  07/01/2012   Procedure: HERNIA REPAIR INGUINAL ADULT;  Surgeon: Madilyn Hook, DO;  Location: [facility];  Service: General;  Laterality: Right;  with Mesh  . left knee meniscus repair  1992  . right rotator cuff  1992  . right shoulder arthroscopy  2011    Family History  Problem Relation Age of Onset  . Cancer Sister        breast  . Leukemia Mother   . Cancer Father     Social History Social History   Tobacco Use  . Smoking status: Former Smoker    Years: 43.00    Types: Cigarettes  Quit date: 07/21/2002    Years since quitting: 16.9  . Smokeless tobacco: Never Used  Substance Use Topics  . Alcohol use: No  . Drug use: No    Comment: 26 years ago cocaine, heroin. methadone    Current Outpatient Medications  Medication Sig Dispense Refill  . albuterol (VENTOLIN HFA) 108 (90 Base) MCG/ACT inhaler INHALE 2 PUFFS INTO THE LUNGS EVERY 6 (SIX) HOURS AS NEEDED FOR SHORTNESS OF BREATH. 18 g 1  . ANORO ELLIPTA 62.5-25 MCG/INH AEPB INHALE 1 PUFF BY MOUTH EVERY DAY (Patient taking differently: Inhale 1 puff into the lungs daily. ) 120 each 1  . aspirin EC 81 MG EC tablet Take 1 tablet (81 mg total) by mouth daily.    . cholecalciferol (VITAMIN D3) 25 MCG (1000 UT) tablet Take 1,000 Units by mouth  daily.    . clopidogrel (PLAVIX) 75 MG tablet Take 1 tablet (75 mg total) by mouth daily for 21 days. 21 tablet 0  . Coenzyme Q10-Vitamin E 100-300 MG-UNIT CHEW Chew by mouth.    . dutasteride (AVODART) 0.5 MG capsule Take 0.5 mg by mouth daily.    . famotidine (HM FAMOTIDINE) 20 MG tablet Take 20 mg by mouth every evening.     Marland Kitchen glucosamine-chondroitin 500-400 MG tablet Take 3 tablets by mouth daily. (Patient taking differently: Take 2 tablets by mouth daily. )    . hydrocortisone cream 0.5 % Apply 1 application topically daily as needed for itching (on eyelids).    . Multiple Vitamins-Minerals (CENTRUM SILVER PO) Take by mouth.    . olmesartan (BENICAR) 40 MG tablet Take 1 tablet (40 mg total) by mouth daily. 90 tablet 1  . omega-3 acid ethyl esters (LOVAZA) 1 g capsule Take 2 capsules (2 g total) by mouth 2 (two) times daily. 360 capsule 1  . rosuvastatin (CRESTOR) 20 MG tablet Take 1 tablet (20 mg total) by mouth daily at 6 PM. 30 tablet 1  . tamsulosin (FLOMAX) 0.4 MG CAPS capsule Take 0.4 mg by mouth daily.      No current facility-administered medications for this visit.    No Known Allergies  Review of Systems  Constitutional: Negative.   HENT: Negative.   Eyes:       Floaters  Respiratory: Positive for wheezing.   Cardiovascular: Negative for chest pain.  Gastrointestinal: Negative.   Endocrine: Negative.   Genitourinary: Negative.   Musculoskeletal: Negative.   Allergic/Immunologic: Negative.   Neurological:       Recent TIA  Hematological: Bruises/bleeds easily.  Psychiatric/Behavioral: Negative.     BP (!) 141/77 (BP Location: Left Arm, Patient Position: Sitting, Cuff Size: Normal)   Pulse 65   Temp 97.7 F (36.5 C)   Resp 16   Ht 5\' 11"  (1.803 m)   Wt 170 lb (77.1 kg)   SpO2 96% Comment: RA  BMI 23.71 kg/m  Physical Exam Constitutional:      Appearance: Normal appearance. He is normal weight.  HENT:     Head: Normocephalic and atraumatic.      Mouth/Throat:     Mouth: Mucous membranes are dry.  Eyes:     Extraocular Movements: Extraocular movements intact.     Pupils: Pupils are equal, round, and reactive to light.  Cardiovascular:     Rate and Rhythm: Normal rate and regular rhythm.     Heart sounds: Normal heart sounds. No murmur.  Pulmonary:     Effort: Pulmonary effort is normal.     Breath sounds:  Normal breath sounds.  Musculoskeletal:        General: No swelling.     Cervical back: Normal range of motion and neck supple.  Skin:    General: Skin is warm and dry.  Neurological:     General: No focal deficit present.     Mental Status: He is alert and oriented to person, place, and time.  Psychiatric:        Mood and Affect: Mood normal.        Behavior: Behavior normal.        Thought Content: Thought content normal.        Judgment: Judgment normal.     Diagnostic Tests:  CLINICAL DATA:  Follow-up dilated ascending thoracic aorta.  EXAM: CT ANGIOGRAPHY CHEST WITH CONTRAST  TECHNIQUE: Multidetector CT imaging of the chest was performed using the standard protocol during bolus administration of intravenous contrast. Multiplanar CT image reconstructions and MIPs were obtained to evaluate the vascular anatomy.  CONTRAST:  121mL ISOVUE-370 IOPAMIDOL (ISOVUE-370) INJECTION 76%  COMPARISON:  08/02/2015.  FINDINGS: Cardiovascular: The aortic root measures 4.4 cm in maximum diameter. The remainder of the ascending thoracic aorta measures 3.7 cm in maximum diameter. The ascending thoracic aorta previously measured up to 4.1 cm in maximum diameter.  The central pulmonary arteries are borderline enlarged with the main pulmonary artery measuring 2.9 cm in maximum diameter.  Mild aortic atheromatous changes are noted.  Mediastinum/Nodes: No enlarged mediastinal, hilar, or axillary lymph nodes. Thyroid gland, trachea, and esophagus demonstrate no significant findings.  Lungs/Pleura: Biapical  pleural and parenchymal scarring. Mild bilateral paraseptal bullous changes.  Upper Abdomen: Unremarkable.  Musculoskeletal: Minimal thoracic spine degenerative changes. Cervical spine fixation hardware.  Review of the MIP images confirms the above findings.  IMPRESSION: 1. Aneurysmal dilatation of the ascending thoracic aorta with an aortic root diameter of 4.4 cm and remainder of the ascending thoracic aorta measuring up to 3.7 cm in maximum diameter. Recommend annual imaging followup by CTA or MRA. This recommendation follows 2010 ACCF/AHA/AATS/ACR/ASA/SCA/SCAI/SIR/STS/SVM Guidelines for the Diagnosis and Management of Patients with Thoracic Aortic Disease. Circulation. 2010; [id number]. Aortic aneurysm NOS (ICD10-I71.9) 2. Borderline dilated main pulmonary artery. 3. Mild bilateral paraseptal bullous emphysema. 4. Mild aortic atherosclerosis.  Aortic Atherosclerosis (ICD10-I70.0) and Emphysema (ICD10-J43.9).   Electronically Signed   By: Claudie Revering M.D.   On: 07/11/2019 13:48  Impression:  This 73 year old gentleman has a 4.4 cm aortic root aneurysm which is slightly larger when compared to his CT scan from January 2017 when it was measured at 4.1 cm.  This is relatively stable when compared to his CT scan of the chest from 2004 when the aorta was measured at 3.9 cm.  This is well below the surgical threshold of 5.5 cm in a patient with a normal trileaflet aortic valve.  I reviewed the CT images with the patient and answered his questions.  I stressed the importance of continued good blood pressure control and preventing further enlargement and acute aortic dissection.  I have recommended that he have a follow-up CTA of the chest in 1 year.   Plan:  He will return to see me in 1 year with a CTA of the chest.    I spent 30 minutes performing this consultation and > 50% of this time was spent face to face counseling and coordinating the care of this patient's  aortic root aneurysm.   Gaye Pollack, MD [facility] [phone]

## 2019-07-19 DIAGNOSIS — S29012A Strain of muscle and tendon of back wall of thorax, initial encounter: Secondary | ICD-10-CM | POA: Diagnosis not present

## 2019-07-19 DIAGNOSIS — M47817 Spondylosis without myelopathy or radiculopathy, lumbosacral region: Secondary | ICD-10-CM | POA: Diagnosis not present

## 2019-07-19 DIAGNOSIS — M9902 Segmental and somatic dysfunction of thoracic region: Secondary | ICD-10-CM | POA: Diagnosis not present

## 2019-07-19 DIAGNOSIS — M47812 Spondylosis without myelopathy or radiculopathy, cervical region: Secondary | ICD-10-CM | POA: Diagnosis not present

## 2019-07-19 DIAGNOSIS — M9903 Segmental and somatic dysfunction of lumbar region: Secondary | ICD-10-CM | POA: Diagnosis not present

## 2019-07-19 DIAGNOSIS — M9901 Segmental and somatic dysfunction of cervical region: Secondary | ICD-10-CM | POA: Diagnosis not present

## 2019-08-02 ENCOUNTER — Telehealth: Payer: PPO | Admitting: Physician Assistant

## 2019-08-02 DIAGNOSIS — M549 Dorsalgia, unspecified: Secondary | ICD-10-CM | POA: Diagnosis not present

## 2019-08-02 MED ORDER — BACLOFEN 10 MG PO TABS: 10.0000 mg | ORAL_TABLET | Freq: Three times a day (TID) | ORAL | 0 refills | Status: DC

## 2019-08-02 MED ORDER — ETODOLAC 300 MG PO CAPS: 300.0000 mg | ORAL_CAPSULE | Freq: Three times a day (TID) | ORAL | 0 refills | Status: AC

## 2019-08-02 NOTE — Progress Notes (Signed)

## 2019-08-05 ENCOUNTER — Encounter: Payer: Self-pay | Admitting: Family Medicine

## 2019-08-08 DIAGNOSIS — S29012A Strain of muscle and tendon of back wall of thorax, initial encounter: Secondary | ICD-10-CM | POA: Diagnosis not present

## 2019-08-08 DIAGNOSIS — M9903 Segmental and somatic dysfunction of lumbar region: Secondary | ICD-10-CM | POA: Diagnosis not present

## 2019-08-08 DIAGNOSIS — M9901 Segmental and somatic dysfunction of cervical region: Secondary | ICD-10-CM | POA: Diagnosis not present

## 2019-08-08 DIAGNOSIS — M9902 Segmental and somatic dysfunction of thoracic region: Secondary | ICD-10-CM | POA: Diagnosis not present

## 2019-08-08 DIAGNOSIS — M47817 Spondylosis without myelopathy or radiculopathy, lumbosacral region: Secondary | ICD-10-CM | POA: Diagnosis not present

## 2019-08-08 DIAGNOSIS — M47812 Spondylosis without myelopathy or radiculopathy, cervical region: Secondary | ICD-10-CM | POA: Diagnosis not present

## 2019-08-10 ENCOUNTER — Other Ambulatory Visit: Payer: Self-pay

## 2019-08-10 ENCOUNTER — Ambulatory Visit (INDEPENDENT_AMBULATORY_CARE_PROVIDER_SITE_OTHER): Payer: PPO | Admitting: Adult Health

## 2019-08-10 ENCOUNTER — Encounter: Payer: Self-pay | Admitting: Adult Health

## 2019-08-10 VITALS — BP 141/83 | HR 62 | Temp 97.2°F | Ht 71.0 in | Wt 172.0 lb

## 2019-08-10 DIAGNOSIS — G459 Transient cerebral ischemic attack, unspecified: Secondary | ICD-10-CM

## 2019-08-10 DIAGNOSIS — I10 Essential (primary) hypertension: Secondary | ICD-10-CM

## 2019-08-10 DIAGNOSIS — I6602 Occlusion and stenosis of left middle cerebral artery: Secondary | ICD-10-CM

## 2019-08-10 DIAGNOSIS — R7303 Prediabetes: Secondary | ICD-10-CM

## 2019-08-10 DIAGNOSIS — E785 Hyperlipidemia, unspecified: Secondary | ICD-10-CM

## 2019-08-10 NOTE — Patient Instructions (Addendum)
Continue aspirin 81 mg daily  and Crestor for secondary stroke prevention  Continue to follow up with PCP regarding cholesterol and blood pressure management  Blood work done today: Lipid panel (cholesterol level) Your results will be released to MyChart tomorrow morning once available  Continue to monitor blood pressure at home  Maintain strict control of hypertension with blood pressure goal below 130/90, diabetes with hemoglobin A1c goal below 6.5% and cholesterol with LDL cholesterol (bad cholesterol) goal below 70 mg/dL. I also advised the patient to eat a healthy diet with plenty of whole grains, cereals, fruits and vegetables, exercise regularly and maintain ideal body weight.  Followup in the future with me in 4 months or call earlier if needed       Thank you for coming to see [place] at [facility]. I hope we have been able to provide you high quality care today.  You may receive a patient satisfaction survey over the next few weeks. We would appreciate your feedback and comments so that we may continue to improve ourselves and the health of our patients.

## 2019-08-10 NOTE — Progress Notes (Signed)
[facility] [address] [place]. [place] [address] [phone]       HOSPITAL FOLLOW UP NOTE  [name] Date of Birth:  [birth year] Medical Record Number:  [id number]   Reason for Referral:  hospital stroke follow up    CHIEF COMPLAINT:  Chief Complaint  Patient presents with  . Hospitalization Follow-up    Alone. Rm 9. No new concerns at this time.     HPI: George Watson being seen today for in office hospital follow-up regarding TIAx2 on 07/07/2019.  History obtained from patient and chart review. Reviewed all radiology images and labs personally.  George Watson is a 74 y.o. male with history of COPD, hypertension, polysubstance abuse in the past, hepatitis C, hypercholesterolemia, prostate cancer presented on 07/07/2019 with transient R arm weakness. Recurrent transient R sided weakness and dysmetria in hospital, no tPA as too mild to treat.  Evaluated by Dr. Erlinda Hong and stroke team with stroke work-up completed and symptoms related to TIAx2 possibly related to left M1 50% stenosis as evidenced on CTA.  CTA head/neck negative E LVO but did show severe stenosis right V1, cervical right ICA arthrosclerosis and left M1 ~50% stenosis.  MRIx2 no acute abnormality.  2D echo showed an EF of 55 to 60%.  EEG obtained due to reoccurring symptoms negative for seizure.  Reports similar symptoms on 05/12/2019.  Previously on aspirin 81 mg 3 times weekly and recommended DAPT for 3 weeks and aspirin 81 mg daily alone.  HTN stable.  LDL 125 previously on Crestor 10 mg 3 times weekly and Lovaza and recommended increasing Crestor dosage to 20 mg daily.  History of intolerance of Lipitor.  Prediabetes with A1c 6.5 and recommend close PCP follow-up.  Other stroke risk factors include advanced age and former tobacco use but no prior history of stroke.  He was discharged home in stable condition without therapy needs.  George Watson is a 74 year old male who is being seen today for  hospital follow-up.  He has been doing well since discharge without reoccurring or new stroke or TIA symptoms.  He has completed 3 weeks DAPT and continues on aspirin alone without bleeding or bruising.  He does endorse initially experiencing headaches but have subsided after discontinuing 3-week Plavix duration and he believes this could have been an intolerance to Plavix.  He has not experienced any additional headaches.  Continues on Crestor 20 mg daily without myalgias.  He has not had any recent lab work follow-up since discharge.  Blood pressure today 141/83 which is normal level for patient.  No further concerns at this time.    ROS:   14 system review of systems performed and negative with exception of no complaints  PMH:  Past Medical History:  Diagnosis Date  . BPH (benign prostatic hypertrophy)   . COPD (chronic obstructive pulmonary disease) ([place])   . DDD (degenerative disc disease), lumbosacral   . Elevated PSA   . Essential hypertension, benign    diet controlled  . Fracture of shaft of clavicle   . GERD (gastroesophageal reflux disease)   . H/O drug abuse ([place])    multisubstance  . Hepatitis C 10/1996  . HSV-2 (herpes simplex virus 2) infection   . Hx of biopsy 08/2004   liver  . Inguinal hernia    right  . Other and unspecified hyperlipidemia   . Prostate cancer ([place])   . Substance abuse ([place])   . Tuberculosis  PSH:  Past Surgical History:  Procedure Laterality Date  . CERVICAL SPINE SURGERY    . fracture ribs     3  . HERNIA REPAIR    . INGUINAL HERNIA REPAIR  07/01/2012   Procedure: HERNIA REPAIR INGUINAL ADULT;  Surgeon: Madilyn Hook, DO;  Location: [facility];  Service: General;  Laterality: Right;  with Mesh  . left knee meniscus repair  1992  . right rotator cuff  1992  . right shoulder arthroscopy  2011    Social History:  Social History   Socioeconomic History  . Marital status: Married    Spouse name: Not on file  . Number of children: Not  on file  . Years of education: Not on file  . Highest education level: Not on file  Occupational History  . Occupation: Geophysicist/field seismologist  Tobacco Use  . Smoking status: Former Smoker    Years: 43.00    Types: Cigarettes    Quit date: 07/21/2002    Years since quitting: 17.0  . Smokeless tobacco: Never Used  Substance and Sexual Activity  . Alcohol use: No  . Drug use: No    Comment: 26 years ago cocaine, heroin. methadone  . Sexual activity: Yes    Comment: number of sex partners in the last 53 months  1  Other Topics Concern  . Not on file  Social History Narrative      Married to [employer]. Exercise cardio daily for 30 minutes. Education: [employer].   Social Determinants of Health   Financial Resource Strain:   . Difficulty of Paying Living Expenses: Not on file  Food Insecurity:   . Worried About Charity fundraiser in the Last Year: Not on file  . Ran Out of Food in the Last Year: Not on file  Transportation Needs:   . Lack of Transportation (Medical): Not on file  . Lack of Transportation (Non-Medical): Not on file  Physical Activity:   . Days of Exercise per Week: Not on file  . Minutes of Exercise per Session: Not on file  Stress:   . Feeling of Stress : Not on file  Social Connections:   . Frequency of Communication with Friends and Family: Not on file  . Frequency of Social Gatherings with Friends and Family: Not on file  . Attends Religious Services: Not on file  . Active Member of Clubs or Organizations: Not on file  . Attends Archivist Meetings: Not on file  . Marital Status: Not on file  Intimate Partner Violence:   . Fear of Current or Ex-Partner: Not on file  . Emotionally Abused: Not on file  . Physically Abused: Not on file  . Sexually Abused: Not on file    Family History:  Family History  Problem Relation Age of Onset  . Cancer Sister        breast  . Leukemia Mother   . Cancer Father     Medications:   Current Outpatient Medications  on File Prior to Visit  Medication Sig Dispense Refill  . albuterol (VENTOLIN HFA) 108 (90 Base) MCG/ACT inhaler INHALE 2 PUFFS INTO THE LUNGS EVERY 6 (SIX) HOURS AS NEEDED FOR SHORTNESS OF BREATH. 18 g 1  . ANORO ELLIPTA 62.5-25 MCG/INH AEPB INHALE 1 PUFF BY MOUTH EVERY DAY (Patient taking differently: Inhale 1 puff into the lungs daily. ) 120 each 1  . aspirin EC 81 MG EC tablet Take 1 tablet (81 mg total) by mouth daily.    Marland Kitchen  cholecalciferol (VITAMIN D3) 25 MCG (1000 UT) tablet Take 1,000 Units by mouth daily.    . Coenzyme Q10-Vitamin E 100-300 MG-UNIT CHEW Chew by mouth.    . dutasteride (AVODART) 0.5 MG capsule Take 0.5 mg by mouth daily.    . famotidine (HM FAMOTIDINE) 20 MG tablet Take 20 mg by mouth every evening.     Marland Kitchen glucosamine-chondroitin 500-400 MG tablet Take 3 tablets by mouth daily. (Patient taking differently: Take 2 tablets by mouth daily. )    . Multiple Vitamins-Minerals (CENTRUM SILVER PO) Take by mouth.    . olmesartan (BENICAR) 40 MG tablet Take 1 tablet (40 mg total) by mouth daily. 90 tablet 1  . Omega-3 Fatty Acids (FISH OIL PO) Take 1 tablet by mouth daily.    . rosuvastatin (CRESTOR) 20 MG tablet Take 1 tablet (20 mg total) by mouth daily at 6 PM. 30 tablet 1  . tamsulosin (FLOMAX) 0.4 MG CAPS capsule Take 0.4 mg by mouth daily.      No current facility-administered medications on file prior to visit.    Allergies:  No Known Allergies   Physical Exam  Vitals:   08/10/19 0902  BP: (!) 141/83  Pulse: 62  Temp: (!) 97.2 F (36.2 C)  TempSrc: Oral  Weight: 172 lb (78 kg)  Height: 5\' 11"  (1.803 m)   Body mass index is 23.99 kg/m. No exam data present  Depression screen [facility] 2/9 08/10/2019  Decreased Interest 0  Down, Depressed, Hopeless 0  PHQ - 2 Score 0     General: well developed, well nourished,  pleasant elderly Caucasian male, seated, in no evident distress Head: head normocephalic and atraumatic.   Neck: supple with no carotid or  supraclavicular bruits Cardiovascular: regular rate and rhythm, no murmurs Musculoskeletal: no deformity Skin:  no rash/petichiae Vascular:  Normal pulses all extremities   Neurologic Exam Mental Status: Awake and fully alert.   Normal speech and language.  Oriented to place and time. Recent and remote memory intact. Attention span, concentration and fund of knowledge appropriate. Mood and affect appropriate.  Cranial Nerves: Fundoscopic exam reveals sharp disc margins. Pupils equal, briskly reactive to light. Extraocular movements full without nystagmus. Visual fields full to confrontation. Hearing intact. Facial sensation intact. Face, tongue, palate moves normally and symmetrically.  Motor: Normal bulk and tone. Normal strength in all tested extremity muscles. Sensory.: intact to touch , pinprick , position and vibratory sensation.  Coordination: Rapid alternating movements normal in all extremities. Finger-to-nose and heel-to-shin performed accurately bilaterally. Gait and Station: Arises from chair without difficulty. Stance is normal. Gait demonstrates normal stride length and balance Reflexes: 1+ and symmetric. Toes downgoing.     NIHSS  0 Modified Rankin  0    Diagnostic Data (Labs, Imaging, Testing)   Code Stroke CT head No acute abnormality. ASPECTS 10.     CTA head & neck no ELVO. Severe stenosis RV1. Cervical right ICA atherosclerosis. L M1 ~50% stenosis.  MRI  No acute abnormality   Repeat MRI neg for acute abnormality    2D Echo EF 55 to 60%  EEG after recurrent symptoms negative for Sz  LDL 125  HgbA1c 6.5  UDS neg    ASSESSMENT: George Watson is a 74 y.o. year old male presented with transient right arm weakness with recurrent right-sided weakness and dysmetria in hospital on 07/07/2019 with stroke work-up completed and symptoms likely related to TIAx2 potentially secondary to left M1 50% stenosis. Vascular risk factors include HTN,  HLD, prediabetes,  prior tobacco use and intracranial stenosis.  Has been doing well from a stroke standpoint without reoccurring or new stroke/TIA symptoms since discharge    PLAN:  1. TIA: Continue aspirin 81 mg daily  and Crestor 20 mg daily for secondary stroke prevention. Maintain strict control of hypertension with blood pressure goal below 130/90, diabetes with hemoglobin A1c goal below 6.5% and cholesterol with LDL cholesterol (bad cholesterol) goal below 70 mg/dL.  I also advised the patient to eat a healthy diet with plenty of whole grains, cereals, fruits and vegetables, exercise regularly with at least 30 minutes of continuous activity daily and maintain ideal body weight. 2. HTN: Advised to continue current treatment regimen.  Today's BP stable.  Advised to continue to monitor at home along with continued follow-up with PCP for management 3. HLD: Advised to continue current treatment regimen along with continued follow-up with PCP for future prescribing and monitoring of lipid panel.  Will obtain lipid panel at today's visit 4. Pre-DMII: Ongoing monitoring by PCP 5. Intercranial stenosis: We will repeat imaging 6 months from discharge for surveillance monitoring.  Discussion regarding importance of managing risk factors along with continuation of daily aspirin and statin use   Follow up in 4 months or call earlier if needed   Greater than 50% of time during this 45 minute visit was spent on counseling, explanation of diagnosis of TIA, reviewing risk factor management of HTN, HLD, pre-DM and intercranial stenosis, planning of further management along with potential future management, and discussion with patient and family answering all questions.    Frann Rider, AGNP-BC  [facility] [address] [place]  [place], [place] [address]  Phone [phone] Fax [phone] Note: This document was prepared with digital dictation and possible smart phrase technology. Any  transcriptional errors that result from this process are unintentional.

## 2019-08-11 LAB — LIPID PANEL
Chol/HDL Ratio: 3.2 ratio (ref 0.0–5.0)
Cholesterol, Total: 115 mg/dL (ref 100–199)
HDL: 36 mg/dL — ABNORMAL LOW (ref >39)
LDL Chol Calc (NIH): 59 mg/dL (ref 0–99)
Triglycerides: 106 mg/dL (ref 0–149)
VLDL Cholesterol Cal: 20 mg/dL (ref 5–40)

## 2019-08-11 NOTE — Progress Notes (Signed)
I agree with the above plan 

## 2019-08-16 DIAGNOSIS — M47812 Spondylosis without myelopathy or radiculopathy, cervical region: Secondary | ICD-10-CM | POA: Diagnosis not present

## 2019-08-16 DIAGNOSIS — M9903 Segmental and somatic dysfunction of lumbar region: Secondary | ICD-10-CM | POA: Diagnosis not present

## 2019-08-16 DIAGNOSIS — S29012A Strain of muscle and tendon of back wall of thorax, initial encounter: Secondary | ICD-10-CM | POA: Diagnosis not present

## 2019-08-16 DIAGNOSIS — M9902 Segmental and somatic dysfunction of thoracic region: Secondary | ICD-10-CM | POA: Diagnosis not present

## 2019-08-16 DIAGNOSIS — M47817 Spondylosis without myelopathy or radiculopathy, lumbosacral region: Secondary | ICD-10-CM | POA: Diagnosis not present

## 2019-08-16 DIAGNOSIS — M9901 Segmental and somatic dysfunction of cervical region: Secondary | ICD-10-CM | POA: Diagnosis not present

## 2019-09-04 ENCOUNTER — Other Ambulatory Visit: Payer: Self-pay | Admitting: Internal Medicine

## 2019-09-04 DIAGNOSIS — I10 Essential (primary) hypertension: Secondary | ICD-10-CM

## 2019-09-05 ENCOUNTER — Encounter: Payer: Self-pay | Admitting: Family Medicine

## 2019-09-05 DIAGNOSIS — J418 Mixed simple and mucopurulent chronic bronchitis: Secondary | ICD-10-CM

## 2019-09-06 NOTE — Telephone Encounter (Signed)
Please Advise

## 2019-09-07 ENCOUNTER — Other Ambulatory Visit: Payer: Self-pay | Admitting: Internal Medicine

## 2019-09-07 ENCOUNTER — Ambulatory Visit (HOSPITAL_COMMUNITY)
Admission: EM | Admit: 2019-09-07 | Discharge: 2019-09-07 | Disposition: A | Discharge status: Home / Self Care | Payer: PPO | Attending: Urgent Care | Admitting: Urgent Care

## 2019-09-07 ENCOUNTER — Encounter (HOSPITAL_COMMUNITY): Payer: Self-pay

## 2019-09-07 ENCOUNTER — Other Ambulatory Visit: Payer: Self-pay

## 2019-09-07 DIAGNOSIS — J418 Mixed simple and mucopurulent chronic bronchitis: Secondary | ICD-10-CM

## 2019-09-07 DIAGNOSIS — R0600 Dyspnea, unspecified: Secondary | ICD-10-CM

## 2019-09-07 DIAGNOSIS — J449 Chronic obstructive pulmonary disease, unspecified: Secondary | ICD-10-CM | POA: Diagnosis not present

## 2019-09-07 DIAGNOSIS — R0789 Other chest pain: Secondary | ICD-10-CM

## 2019-09-07 MED ORDER — PREDNISONE 20 MG PO TABS: ORAL_TABLET | ORAL | 0 refills | Status: DC

## 2019-09-07 NOTE — ED Triage Notes (Signed)
Pt presents to UC with SOB X 3 days. Pt reports he has emphysema and he think he is having a flare up. Pt reports the SOB is worst in the morning.

## 2019-09-07 NOTE — ED Provider Notes (Signed)
[place]   MRN: [id number] DOB: June 13, 1946  Subjective:   George Watson is a 74 y.o. male presenting for 3 day hx of acute onset shob, chest heaviness. Reports that his wife said he was pursing his lips to breathe yesterday but has not noticed it himself. Denies fever, chest pain, wheezing. Has had his covid vaccines, 2 doses. Practices social distancing strictly due to his COPD. He is taking his aspirin daily, using his COPD treatments. Does not need refills on his inhalers.   No current facility-administered medications for this encounter.  Current Outpatient Medications:  .  albuterol (VENTOLIN HFA) 108 (90 Base) MCG/ACT inhaler, INHALE 2 PUFFS INTO THE LUNGS EVERY 6 (SIX) HOURS AS NEEDED FOR SHORTNESS OF BREATH., Disp: 18 g, Rfl: 1 .  ANORO ELLIPTA 62.5-25 MCG/INH AEPB, INHALE 1 PUFF BY MOUTH EVERY DAY (Patient taking differently: Inhale 1 puff into the lungs daily. ), Disp: 120 each, Rfl: 1 .  aspirin EC 81 MG EC tablet, Take 1 tablet (81 mg total) by mouth daily., Disp: , Rfl:  .  cholecalciferol (VITAMIN D3) 25 MCG (1000 UT) tablet, Take 1,000 Units by mouth daily., Disp: , Rfl:  .  Coenzyme Q10-Vitamin E 100-300 MG-UNIT CHEW, Chew by mouth., Disp: , Rfl:  .  dutasteride (AVODART) 0.5 MG capsule, Take 0.5 mg by mouth daily., Disp: , Rfl:  .  famotidine (HM FAMOTIDINE) 20 MG tablet, Take 20 mg by mouth every evening. , Disp: , Rfl:  .  glucosamine-chondroitin 500-400 MG tablet, Take 3 tablets by mouth daily. (Patient taking differently: Take 2 tablets by mouth daily. ), Disp: , Rfl:  .  Multiple Vitamins-Minerals (CENTRUM SILVER PO), Take by mouth., Disp: , Rfl:  .  olmesartan (BENICAR) 40 MG tablet, Take 1 tablet (40 mg total) by mouth daily., Disp: 90 tablet, Rfl: 1 .  Omega-3 Fatty Acids (FISH OIL PO), Take 1 tablet by mouth daily., Disp: , Rfl:  .  rosuvastatin (CRESTOR) 20 MG tablet, Take 1 tablet (20 mg total) by mouth daily at 6 PM., Disp: 30 tablet, Rfl: 1 .   tamsulosin (FLOMAX) 0.4 MG CAPS capsule, Take 0.4 mg by mouth daily. , Disp: , Rfl:    No Known Allergies  Past Medical History:  Diagnosis Date  . BPH (benign prostatic hypertrophy)   . COPD (chronic obstructive pulmonary disease) ([place])   . DDD (degenerative disc disease), lumbosacral   . Elevated PSA   . Essential hypertension, benign    diet controlled  . Fracture of shaft of clavicle   . GERD (gastroesophageal reflux disease)   . H/O drug abuse ([place])    multisubstance  . Hepatitis C 10/1996  . HSV-2 (herpes simplex virus 2) infection   . Hx of biopsy 08/2004   liver  . Inguinal hernia    right  . Other and unspecified hyperlipidemia   . Prostate cancer ([place])   . Substance abuse ([place])   . Tuberculosis      Past Surgical History:  Procedure Laterality Date  . CERVICAL SPINE SURGERY    . fracture ribs     3  . HERNIA REPAIR    . INGUINAL HERNIA REPAIR  07/01/2012   Procedure: HERNIA REPAIR INGUINAL ADULT;  Surgeon: Madilyn Hook, DO;  Location: [facility];  Service: General;  Laterality: Right;  with Mesh  . left knee meniscus repair  1992  . right rotator cuff  1992  . right shoulder arthroscopy  2011    Family  History  Problem Relation Age of Onset  . Cancer Sister        breast  . Leukemia Mother   . Cancer Father     Social History   Tobacco Use  . Smoking status: Former Smoker    Years: 43.00    Types: Cigarettes    Quit date: 07/21/2002    Years since quitting: 17.1  . Smokeless tobacco: Never Used  Substance Use Topics  . Alcohol use: No  . Drug use: No    Comment: 26 years ago cocaine, heroin. methadone    ROS   Objective:   Vitals: BP 133/66 (BP Location: Right Arm)   Pulse 69   Temp 97.9 F (36.6 C) (Oral)   Resp 19   SpO2 96%   Physical Exam Constitutional:      General: He is not in acute distress.    Appearance: Normal appearance. He is well-developed. He is not ill-appearing, toxic-appearing or diaphoretic.  HENT:     Head:  Normocephalic and atraumatic.     Right Ear: External ear normal.     Left Ear: External ear normal.     Nose: Nose normal.     Mouth/Throat:     Mouth: Mucous membranes are moist.     Pharynx: Oropharynx is clear.  Eyes:     General: No scleral icterus.    Extraocular Movements: Extraocular movements intact.     Pupils: Pupils are equal, round, and reactive to light.  Cardiovascular:     Rate and Rhythm: Normal rate and regular rhythm.     Heart sounds: Normal heart sounds. No murmur. No friction rub. No gallop.   Pulmonary:     Effort: Pulmonary effort is normal. No respiratory distress.     Breath sounds: Normal breath sounds. No stridor. No wheezing, rhonchi or rales.     Comments: Coarse lung sounds throughout. No use of accessory muscles.  Neurological:     Mental Status: He is alert and oriented to person, place, and time.  Psychiatric:        Mood and Affect: Mood normal.        Behavior: Behavior normal.        Thought Content: Thought content normal.      Assessment and Plan :   1. Chronic obstructive pulmonary disease, unspecified COPD type ([place])   2. Dyspnea, unspecified type   3. Chest heaviness     Start prednisone for his shob, chest heaviness likely due to his COPD. No sounds of pna, acute pulmonary process and therefore will hold off on x-ray, antibiotics. Physical exam findings and vital signs stable for discharge. Counseled patient on potential for adverse effects with medications prescribed/recommended today, strict ER and return-to-clinic precautions discussed, patient verbalized understanding.    Jaynee Eagles, PA-C 09/07/19 1502

## 2019-09-07 NOTE — Telephone Encounter (Signed)
Called patient to offer virtual with Sam but he was in urgent care to be seen.  Will call if any questions.

## 2019-09-08 MED ORDER — ANORO ELLIPTA 62.5-25 MCG/INH IN AEPB: INHALATION_SPRAY | RESPIRATORY_TRACT | 1 refills | Status: DC

## 2019-09-13 DIAGNOSIS — M9902 Segmental and somatic dysfunction of thoracic region: Secondary | ICD-10-CM | POA: Diagnosis not present

## 2019-09-13 DIAGNOSIS — S29012A Strain of muscle and tendon of back wall of thorax, initial encounter: Secondary | ICD-10-CM | POA: Diagnosis not present

## 2019-09-13 DIAGNOSIS — M9901 Segmental and somatic dysfunction of cervical region: Secondary | ICD-10-CM | POA: Diagnosis not present

## 2019-09-13 DIAGNOSIS — M47812 Spondylosis without myelopathy or radiculopathy, cervical region: Secondary | ICD-10-CM | POA: Diagnosis not present

## 2019-09-13 DIAGNOSIS — M9903 Segmental and somatic dysfunction of lumbar region: Secondary | ICD-10-CM | POA: Diagnosis not present

## 2019-09-13 DIAGNOSIS — M47817 Spondylosis without myelopathy or radiculopathy, lumbosacral region: Secondary | ICD-10-CM | POA: Diagnosis not present

## 2019-09-15 ENCOUNTER — Telehealth: Payer: Self-pay | Admitting: Family Medicine

## 2019-09-15 NOTE — Telephone Encounter (Signed)
I left a message asking the patient to call and schedule Medicare AWV with Loma Sousa ([place]) on 10/10/2019 after seeing Dr. Rogers Blocker.  I'm waiting for a call back to either confirm or decline the appointment. If patient calls back, please update appointment notes.  VDM ([place])

## 2019-09-22 ENCOUNTER — Other Ambulatory Visit: Payer: Self-pay | Admitting: Family Medicine

## 2019-09-22 ENCOUNTER — Other Ambulatory Visit: Payer: Self-pay

## 2019-09-22 NOTE — Telephone Encounter (Signed)
Sent to pharmacy 

## 2019-10-10 ENCOUNTER — Encounter: Payer: Self-pay | Admitting: Family Medicine

## 2019-10-10 ENCOUNTER — Ambulatory Visit (INDEPENDENT_AMBULATORY_CARE_PROVIDER_SITE_OTHER): Payer: PPO | Admitting: Family Medicine

## 2019-10-10 ENCOUNTER — Other Ambulatory Visit: Payer: Self-pay

## 2019-10-10 ENCOUNTER — Ambulatory Visit (INDEPENDENT_AMBULATORY_CARE_PROVIDER_SITE_OTHER): Payer: PPO

## 2019-10-10 VITALS — BP 110/70 | HR 77 | Temp 97.5°F | Ht 71.0 in | Wt 179.2 lb

## 2019-10-10 VITALS — BP 110/70 | Temp 97.5°F | Ht 71.0 in | Wt 179.2 lb

## 2019-10-10 DIAGNOSIS — J449 Chronic obstructive pulmonary disease, unspecified: Secondary | ICD-10-CM

## 2019-10-10 DIAGNOSIS — E785 Hyperlipidemia, unspecified: Secondary | ICD-10-CM

## 2019-10-10 DIAGNOSIS — R7303 Prediabetes: Secondary | ICD-10-CM | POA: Diagnosis not present

## 2019-10-10 DIAGNOSIS — Z0000 Encounter for general adult medical examination without abnormal findings: Secondary | ICD-10-CM

## 2019-10-10 DIAGNOSIS — R252 Cramp and spasm: Secondary | ICD-10-CM

## 2019-10-10 DIAGNOSIS — E559 Vitamin D deficiency, unspecified: Secondary | ICD-10-CM

## 2019-10-10 LAB — CBC WITH DIFFERENTIAL/PLATELET
Basophils Absolute: 0.1 10*3/uL (ref 0.0–0.1)
Basophils Relative: 0.8 % (ref 0.0–3.0)
Eosinophils Absolute: 0.4 10*3/uL (ref 0.0–0.7)
Eosinophils Relative: 4.4 % (ref 0.0–5.0)
HCT: 46.9 % (ref 39.0–52.0)
Hemoglobin: 15.9 g/dL (ref 13.0–17.0)
Lymphocytes Relative: 26.5 % (ref 12.0–46.0)
Lymphs Abs: 2.6 10*3/uL (ref 0.7–4.0)
MCHC: 33.9 g/dL (ref 30.0–36.0)
MCV: 91.2 fl (ref 78.0–100.0)
Monocytes Absolute: 1.3 10*3/uL — ABNORMAL HIGH (ref 0.1–1.0)
Monocytes Relative: 13.3 % — ABNORMAL HIGH (ref 3.0–12.0)
Neutro Abs: 5.5 10*3/uL (ref 1.4–7.7)
Neutrophils Relative %: 55 % (ref 43.0–77.0)
Platelets: 259 10*3/uL (ref 150.0–400.0)
RBC: 5.14 Mil/uL (ref 4.22–5.81)
RDW: 13.2 % (ref 11.5–15.5)
WBC: 10 10*3/uL (ref 4.0–10.5)

## 2019-10-10 LAB — COMPREHENSIVE METABOLIC PANEL
ALT: 23 U/L (ref 0–53)
AST: 19 U/L (ref 0–37)
Albumin: 4.4 g/dL (ref 3.5–5.2)
Alkaline Phosphatase: 58 U/L (ref 39–117)
BUN: 26 mg/dL — ABNORMAL HIGH (ref 6–23)
CO2: 26 mEq/L (ref 19–32)
Calcium: 10 mg/dL (ref 8.4–10.5)
Chloride: 104 mEq/L (ref 96–112)
Creatinine, Ser: 0.99 mg/dL (ref 0.40–1.50)
GFR: 74.21 mL/min (ref >60.00)
Glucose, Bld: 158 mg/dL — ABNORMAL HIGH (ref 70–99)
Potassium: 4.7 mEq/L (ref 3.5–5.1)
Sodium: 138 mEq/L (ref 135–145)
Total Bilirubin: 0.6 mg/dL (ref 0.2–1.2)
Total Protein: 7.2 g/dL (ref 6.0–8.3)

## 2019-10-10 LAB — VITAMIN D 25 HYDROXY (VIT D DEFICIENCY, FRACTURES): VITD: 40.12 ng/mL (ref 30.00–100.00)

## 2019-10-10 LAB — TSH: TSH: 1.92 u[IU]/mL (ref 0.35–4.50)

## 2019-10-10 LAB — HEMOGLOBIN A1C: Hgb A1c MFr Bld: 6.4 % (ref 4.6–6.5)

## 2019-10-10 LAB — CK: Total CK: 127 U/L (ref 7–232)

## 2019-10-10 NOTE — Patient Instructions (Signed)
-  referral done for pulmonologist. Let me know if you don't hear from them.   -drop down to crestor 10mg /day. Let's recheck lipid panel in 3 months. I put order in, just need lab appointment.

## 2019-10-10 NOTE — Patient Instructions (Signed)
George Watson , Thank you for taking time to come for your Medicare Wellness Visit. I appreciate your ongoing commitment to your health goals. Please review the following plan we discussed and let me know if I can assist you in the future.   Screening recommendations/referrals: Colorectal Screening: up to date; last colonoscopy 08/24/17  Vision and Dental Exams: Recommended annual ophthalmology exams for early detection of glaucoma and other disorders of the eye Recommended annual dental exams for proper oral hygiene   Vaccinations: Influenza vaccine: completed 03/29/19 Pneumococcal vaccine: up to date; last 07/08/16 Tdap vaccine: up to date; last 01/03/15  Shingles vaccine:  You may receive this vaccine at your local pharmacy. (see handout)   Advanced directives:Please bring a copy of your POA (Power of Attorney) and/or Living Will to your next appointment.  Goals: Recommend to drink at least 6-8 8oz glasses of water per day and consume a balanced diet rich in fresh fruits and vegetables.   Next appointment: Please schedule your Annual Wellness Visit with your Nurse Health Advisor in one year.  Preventive Care 73 Years and Older, Male Preventive care refers to lifestyle choices and visits with your health care provider that can promote health and wellness. What does preventive care include?  A yearly physical exam. This is also called an annual well check.  Dental exams once or twice a year.  Routine eye exams. Ask your health care provider how often you should have your eyes checked.  Personal lifestyle choices, including:  Daily care of your teeth and gums.  Regular physical activity.  Eating a healthy diet.  Avoiding tobacco and drug use.  Limiting alcohol use.  Practicing safe sex.  Taking low doses of aspirin every day if recommended by your health care provider..  Taking vitamin and mineral supplements as recommended by your health care provider. What happens during an  annual well check? The services and screenings done by your health care provider during your annual well check will depend on your age, overall health, lifestyle risk factors, and family history of disease. Counseling  Your health care provider may ask you questions about your:  Alcohol use.  Tobacco use.  Drug use.  Emotional well-being.  Home and relationship well-being.  Sexual activity.  Eating habits.  History of falls.  Memory and ability to understand (cognition).  Work and work Statistician. Screening  You may have the following tests or measurements:  Height, weight, and BMI.  Blood pressure.  Lipid and cholesterol levels. These may be checked every 5 years, or more frequently if you are over 24 years old.  Skin check.  Lung cancer screening. You may have this screening every year starting at age 64 if you have a 30-pack-year history of smoking and currently smoke or have quit within the past 15 years.  Fecal occult blood test (FOBT) of the stool. You may have this test every year starting at age 73.  Flexible sigmoidoscopy or colonoscopy. You may have a sigmoidoscopy every 5 years or a colonoscopy every 10 years starting at age 73.  Prostate cancer screening. Recommendations will vary depending on your family history and other risks.  Hepatitis C blood test.  Hepatitis B blood test.  Sexually transmitted disease (STD) testing.  Diabetes screening. This is done by checking your blood sugar (glucose) after you have not eaten for a while (fasting). You may have this done every 1-3 years.  Abdominal aortic aneurysm (AAA) screening. You may need this if you are a current  or former smoker.  Osteoporosis. You may be screened starting at age 60 if you are at high risk. Talk with your health care provider about your test results, treatment options, and if necessary, the need for more tests. Vaccines  Your health care provider may recommend certain vaccines,  such as:  Influenza vaccine. This is recommended every year.  Tetanus, diphtheria, and acellular pertussis (Tdap, Td) vaccine. You may need a Td booster every 10 years.  Zoster vaccine. You may need this after age 60.  Pneumococcal 13-valent conjugate (PCV13) vaccine. One dose is recommended after age 59.  Pneumococcal polysaccharide (PPSV23) vaccine. One dose is recommended after age 49. Talk to your health care provider about which screenings and vaccines you need and how often you need them. This information is not intended to replace advice given to you by your health care provider. Make sure you discuss any questions you have with your health care provider. Document Released: 08/03/2015 Document Revised: 03/26/2016 Document Reviewed: 05/08/2015 Elsevier Interactive Patient Education  2017 [place] Prevention in the Home Falls can cause injuries. They can happen to people of all ages. There are many things you can do to make your home safe and to help prevent falls. What can I do on the outside of my home?  Regularly fix the edges of walkways and driveways and fix any cracks.  Remove anything that might make you trip as you walk through a door, such as a raised step or threshold.  Trim any bushes or trees on the path to your home.  Use bright outdoor lighting.  Clear any walking paths of anything that might make someone trip, such as rocks or tools.  Regularly check to see if handrails are loose or broken. Make sure that both sides of any steps have handrails.  Any raised decks and porches should have guardrails on the edges.  Have any leaves, snow, or ice cleared regularly.  Use sand or salt on walking paths during winter.  Clean up any spills in your garage right away. This includes oil or grease spills. What can I do in the bathroom?  Use night lights.  Install grab bars by the toilet and in the tub and shower. Do not use towel bars as grab bars.  Use  non-skid mats or decals in the tub or shower.  If you need to sit down in the shower, use a plastic, non-slip stool.  Keep the floor dry. Clean up any water that spills on the floor as soon as it happens.  Remove soap buildup in the tub or shower regularly.  Attach bath mats securely with double-sided non-slip rug tape.  Do not have throw rugs and other things on the floor that can make you trip. What can I do in the bedroom?  Use night lights.  Make sure that you have a light by your bed that is easy to reach.  Do not use any sheets or blankets that are too big for your bed. They should not hang down onto the floor.  Have a firm chair that has side arms. You can use this for support while you get dressed.  Do not have throw rugs and other things on the floor that can make you trip. What can I do in the kitchen?  Clean up any spills right away.  Avoid walking on wet floors.  Keep items that you use a lot in easy-to-reach places.  If you need to reach something above you,  use a strong step stool that has a grab bar.  Keep electrical cords out of the way.  Do not use floor polish or wax that makes floors slippery. If you must use wax, use non-skid floor wax.  Do not have throw rugs and other things on the floor that can make you trip. What can I do with my stairs?  Do not leave any items on the stairs.  Make sure that there are handrails on both sides of the stairs and use them. Fix handrails that are broken or loose. Make sure that handrails are as long as the stairways.  Check any carpeting to make sure that it is firmly attached to the stairs. Fix any carpet that is loose or worn.  Avoid having throw rugs at the top or bottom of the stairs. If you do have throw rugs, attach them to the floor with carpet tape.  Make sure that you have a light switch at the top of the stairs and the bottom of the stairs. If you do not have them, ask someone to add them for you. What  else can I do to help prevent falls?  Wear shoes that:  Do not have high heels.  Have rubber bottoms.  Are comfortable and fit you well.  Are closed at the toe. Do not wear sandals.  If you use a stepladder:  Make sure that it is fully opened. Do not climb a closed stepladder.  Make sure that both sides of the stepladder are locked into place.  Ask someone to hold it for you, if possible.  Clearly mark and make sure that you can see:  Any grab bars or handrails.  First and last steps.  Where the edge of each step is.  Use tools that help you move around (mobility aids) if they are needed. These include:  Canes.  Walkers.  Scooters.  Crutches.  Turn on the lights when you go into a dark area. Replace any light bulbs as soon as they burn out.  Set up your furniture so you have a clear path. Avoid moving your furniture around.  If any of your floors are uneven, fix them.  If there are any pets around you, be aware of where they are.  Review your medicines with your doctor. Some medicines can make you feel dizzy. This can increase your chance of falling. Ask your doctor what other things that you can do to help prevent falls. This information is not intended to replace advice given to you by your health care provider. Make sure you discuss any questions you have with your health care provider. Document Released: 05/03/2009 Document Revised: 12/13/2015 Document Reviewed: 08/11/2014 Elsevier Interactive Patient Education  2017 [employer].

## 2019-10-10 NOTE — Progress Notes (Signed)
Patient: George Watson MRN: [id number] DOB: 1945-08-07 PCP: [name], MD     Subjective:  Chief Complaint  Patient presents with  . Prediabetes  . Hyperlipidemia  . leg cramping    HPI: The patient is a 74 y.o. male who presents today for prediabetes, hyperlipidemia, leg cramping and copd.  He also has complaints of muscle cramping in his bilateral lower legs. He started noitcing this after he started crestor, 20mg . It was increased at hospital in December from 10mg  after his TIA and LDL not to goal.  He states it has crept up on him and he notices more after exercise and has a shortened exercise time due to it.   Prediabetes: last a1c was 6.5. discussed true diagnosis of diabetes, but he was in hospital. Will check today. He is exercising and eating right. Unsure if parents had diabetes.   Hyperlipidemia: on crestor 20mg  after increased at hospital to 20mg  after LDL not at goal to less than 70. Having cramping since the increase and wondering if due to medication. LDL to goal on recheck 2 months ago.   Copd: would like referral to pulm for possible treatment adjustment. Has to take steroid every 3 months. Currently on anoro.   Review of Systems  Constitutional: Negative for chills, fatigue and fever.  HENT: Negative for congestion, sinus pain and sore throat.   Respiratory: Negative for cough, shortness of breath and wheezing.   Cardiovascular: Negative for chest pain and palpitations.  Gastrointestinal: Negative for abdominal pain, diarrhea and vomiting.  Musculoskeletal: Positive for myalgias.  Neurological: Negative for dizziness, light-headedness and headaches.    Allergies Patient has No Known Allergies.  Past Medical History Patient  has a past medical history of BPH (benign prostatic hypertrophy), COPD (chronic obstructive pulmonary disease) ([place]), DDD (degenerative disc disease), lumbosacral, Elevated PSA, Essential hypertension, benign, Fracture of shaft of  clavicle, GERD (gastroesophageal reflux disease), H/O drug abuse ([place]), Hepatitis C (10/1996), HSV-2 (herpes simplex virus 2) infection, biopsy (08/2004), Inguinal hernia, Other and unspecified hyperlipidemia, Prostate cancer ([place]), Substance abuse ([place]), and Tuberculosis.  Surgical History Patient  has a past surgical history that includes fracture ribs; Cervical spine surgery; right shoulder arthroscopy (2011); right rotator cuff (1992); left knee meniscus repair (1992); Inguinal hernia repair (07/01/2012); and Hernia repair.  Family History [name]'s family history includes Cancer in his father and sister; Leukemia in his mother.  Social History Patient  reports that he quit smoking about 17 years ago. His smoking use included cigarettes. He quit after 43.00 years of use. He has never used smokeless tobacco. He reports that he does not drink alcohol or use drugs.    Objective: Vitals:   10/10/19 1319  BP: 110/70  Pulse: 77  Temp: (!) 97.5 F (36.4 C)  TempSrc: Temporal  SpO2: 96%  Weight: 179 lb 3.2 oz (81.3 kg)  Height: 5\' 11"  (1.803 m)    Body mass index is 24.99 kg/m.  Physical Exam Vitals reviewed.  Constitutional:      Appearance: Normal appearance. He is well-developed and normal weight.  HENT:     Head: Normocephalic and atraumatic.     Right Ear: Tympanic membrane, ear canal and external ear normal.     Left Ear: Tympanic membrane, ear canal and external ear normal.     Mouth/Throat:     Mouth: Mucous membranes are moist.  Eyes:     Extraocular Movements: Extraocular movements intact.     Conjunctiva/sclera: Conjunctivae normal.     Pupils:  Pupils are equal, round, and reactive to light.  Neck:     Thyroid: No thyromegaly.     Vascular: No carotid bruit.  Cardiovascular:     Rate and Rhythm: Normal rate and regular rhythm.     Heart sounds: Normal heart sounds. No murmur.  Pulmonary:     Effort: Pulmonary effort is normal.     Breath sounds: Normal breath  sounds.  Abdominal:     General: Abdomen is flat. Bowel sounds are normal. There is no distension.     Palpations: Abdomen is soft.     Tenderness: There is no abdominal tenderness.  Musculoskeletal:     Cervical back: Normal range of motion and neck supple.  Lymphadenopathy:     Cervical: No cervical adenopathy.  Skin:    General: Skin is warm and dry.     Capillary Refill: Capillary refill takes less than 2 seconds.     Findings: No rash.  Neurological:     General: No focal deficit present.     Mental Status: He is alert and oriented to person, place, and time.     Cranial Nerves: No cranial nerve deficit.     Coordination: Coordination normal.     Deep Tendon Reflexes: Reflexes normal.  Psychiatric:        Mood and Affect: Mood normal.        Behavior: Behavior normal.      Clinical Support from 10/10/2019 in [facility]  PHQ-2 Total Score  0         Assessment/plan: 1. Prediabetes Checking labs. Was 6.5 in hospital and discussed this is diagnosis of diabetes. Would not add medication at 74 years of age with an a1c so tightly controlled, but discussed we will need to watch this. He is on statin/ARB. Has had pneumonia and covid shots. F/u in 3-6 months depending on a1c.  - Hemoglobin A1c  2. Hyperlipidemia with target LDL less than 70 Having cramping. Wasn't taking his 10mg  like he was supposed to so we will try 10mg  again of his crestor and repeat lipid panel in 3 months. Future order placed. Continue healthy diet/exercise.  - CBC with Differential/Platelet - Comprehensive metabolic panel - Lipid panel; Future  3. Leg cramping See above. Checking tsh/ck as well. Increase water/stretching.  - TSH - CK  4. Chronic obstructive pulmonary disease, unspecified COPD type ([place])  - Ambulatory referral to Pulmonology  5. Vitamin D deficiency  - VITAMIN D 25 Hydroxy (Vit-D Deficiency, Fractures)    This visit occurred during the SARS-CoV-2  public health emergency.  Safety protocols were in place, including screening questions prior to the visit, additional usage of staff PPE, and extensive cleaning of exam room while observing appropriate contact time as indicated for disinfecting solutions.     Return in about 6 months (around 04/11/2020) for prediabetes/htn/routine f/u .   [name], MD [place]   10/10/2019

## 2019-10-10 NOTE — Progress Notes (Signed)
Subjective:   George Watson is a 74 y.o. male who presents for Medicare Annual/Subsequent preventive examination.  Review of Systems:   Cardiac Risk Factors include: advanced age (>38men, >6 women);male gender;hypertension;dyslipidemia    Objective:    Vitals: BP 110/70   Temp (!) 97.5 F (36.4 C) (Temporal)   Ht 5\' 11"  (1.803 m)   Wt 179 lb 3.7 oz (81.3 kg)   SpO2 96%   BMI 25.00 kg/m   Body mass index is 25 kg/m.  Advanced Directives 10/10/2019 07/07/2019 07/07/2019 07/07/2019 04/02/2017 06/29/2012 02/02/2012  Does Patient Have a Medical Advance Directive? Yes Yes Yes No No;Yes Patient has advance directive, copy not in chart Patient does not have advance directive  Type of Advance Directive Living will;Healthcare Power of Attorney Living will Living will - - Living will -  Does patient want to make changes to medical advance directive? No - Patient declined No - Patient declined - - - - -  Copy of [place] in Chart? No - copy requested - - - - Copy requested from family -  Would patient like information on creating a medical advance directive? - No - Patient declined No - Patient declined No - Patient declined - - -  Pre-existing out of facility DNR order (yellow form or pink MOST form) - - - - - No -    Tobacco Social History   Tobacco Use  Smoking Status Former Smoker  . Years: 43.00  . Types: Cigarettes  . Quit date: 07/21/2002  . Years since quitting: 17.2  Smokeless Tobacco Never Used     Counseling given: Not Answered   Clinical Intake:  Pre-visit preparation completed: Yes  Pain : No/denies pain  Diabetes: No  How often do you need to have someone help you when you read instructions, pamphlets, or other written materials from your doctor or pharmacy?: 1 - Never  Interpreter Needed?: No  Information entered by :: Denman George LPN  Past Medical History:  Diagnosis Date  . BPH (benign prostatic hypertrophy)   . COPD (chronic  obstructive pulmonary disease) ([place])   . DDD (degenerative disc disease), lumbosacral   . Elevated PSA   . Essential hypertension, benign    diet controlled  . Fracture of shaft of clavicle   . GERD (gastroesophageal reflux disease)   . H/O drug abuse ([place])    multisubstance  . Hepatitis C 10/1996  . HSV-2 (herpes simplex virus 2) infection   . Hx of biopsy 08/2004   liver  . Inguinal hernia    right  . Other and unspecified hyperlipidemia   . Prostate cancer ([place])   . Substance abuse ([place])   . Tuberculosis    Past Surgical History:  Procedure Laterality Date  . CERVICAL SPINE SURGERY    . fracture ribs     3  . HERNIA REPAIR    . INGUINAL HERNIA REPAIR  07/01/2012   Procedure: HERNIA REPAIR INGUINAL ADULT;  Surgeon: Madilyn Hook, DO;  Location: [facility];  Service: General;  Laterality: Right;  with Mesh  . left knee meniscus repair  1992  . right rotator cuff  1992  . right shoulder arthroscopy  2011   Family History  Problem Relation Age of Onset  . Cancer Sister        breast  . Leukemia Mother   . Cancer Father    Social History   Socioeconomic History  . Marital status: Married  Spouse name: Not on file  . Number of children: Not on file  . Years of education: Not on file  . Highest education level: Not on file  Occupational History  . Occupation: Geophysicist/field seismologist  . 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 Smoking status: Former Smoker    Years: 43.00    Types: Cigarettes    Quit date: 07/21/2002    Years since quitting: 17.2  . Smokeless tobacco: Never Used  Substance and Sexual Activity  . Alcohol use: No  . Drug use: No    Comment: 26 years ago cocaine, heroin. methadone  . Sexual activity: Yes    Comment: number of sex partners in the last 9 months  1  Other Topics Concern  . Not on file  Social History Narrative      Married to [employer]. Exercise cardio daily for 30 minutes. Education: [employer].    Social Determinants of Health   Financial Resource Strain:   . Difficulty of Paying Living Expenses:   Food Insecurity:   . Worried About Charity fundraiser in the Last Year:   . Arboriculturist in the Last Year:   Transportation Needs:   . Film/video editor (Medical):   Marland Kitchen Lack of Transportation (Non-Medical):   Physical Activity:   . Days of Exercise per Week:   . Minutes of Exercise per Session:   Stress:   . Feeling of Stress :   Social Connections:   . Frequency of Communication with Friends and Family:   . Frequency of Social Gatherings with Friends and Family:   . Attends Religious Services:   . Active Member of Clubs or Organizations:   . Attends Archivist Meetings:   Marland Kitchen Marital Status:     Outpatient Encounter Medications as of 10/10/2019  Medication Sig  . albuterol (VENTOLIN HFA) 108 (90 Base) MCG/ACT inhaler INHALE 2 PUFFS INTO THE LUNGS EVERY 6 (SIX) HOURS AS NEEDED FOR SHORTNESS OF BREATH.  Marland Kitchen aspirin EC 81 MG EC tablet Take 1 tablet (81 mg total) by mouth daily.  . cholecalciferol (VITAMIN D3) 25 MCG (1000 UT) tablet Take 1,000 Units by mouth daily.  . Coenzyme Q10-Vitamin E 100-300 MG-UNIT CHEW Chew by mouth.  . dutasteride (AVODART) 0.5 MG capsule Take 0.5 mg by mouth daily.  . famotidine (HM FAMOTIDINE) 20 MG tablet Take 20 mg by mouth every evening.   Marland Kitchen glucosamine-chondroitin 500-400 MG tablet Take 3 tablets by mouth daily. (Patient taking differently: Take 2 tablets by mouth daily. )  . Multiple Vitamins-Minerals (CENTRUM SILVER PO) Take by mouth.  . olmesartan (BENICAR) 40 MG tablet Take 1 tablet (40 mg total) by mouth daily.  . Omega-3 Fatty Acids (FISH OIL PO) Take 1 tablet by mouth daily.  . predniSONE (DELTASONE) 20 MG tablet Take 2 tablets daily with breakfast. (Patient not taking: Reported on 10/10/2019)  . rosuvastatin (CRESTOR) 20 MG tablet TAKE 1 TABLET (20 MG TOTAL) BY MOUTH DAILY AT 6 PM.  . tamsulosin (FLOMAX) 0.4 MG CAPS capsule  Take 0.4 mg by mouth daily.   Marland Kitchen umeclidinium-vilanterol (ANORO ELLIPTA) 62.5-25 MCG/INH AEPB INHALE 1 PUFF BY MOUTH EVERY DAY   No facility-administered encounter medications on file as of 10/10/2019.    Activities of Daily Living In your present state of health, do you have any difficulty performing the following activities: 10/10/2019 07/07/2019  Hearing? N N  Vision?  N N  Difficulty concentrating or making decisions? N N  Walking or climbing stairs? N N  Dressing or bathing? N N  Doing errands, shopping? N -  Preparing Food and eating ? N -  Using the Toilet? N -  In the past six months, have you accidently leaked urine? N -  Do you have problems with loss of bowel control? N -  Managing your Medications? N -  Managing your Finances? N -  Housekeeping or managing your Housekeeping? N -  Some recent data might be hidden    Patient Care Team: [name], MD as PCP - General (Family Medicine) Verl Blalock, Marijo Conception, MD (Inactive) (Cardiology) Richmond Campbell, MD (Gastroenterology) Newt Minion, MD (Orthopedic Surgery) Kathie Rhodes, MD (Urology)   Assessment:   This is a routine wellness examination for [facility].  Exercise Activities and Dietary recommendations Current Exercise Habits: Home exercise routine, Type of exercise: walking;Other - see comments(outside yardwork activities), Time (Minutes): 45, Frequency (Times/Week): 3, Weekly Exercise (Minutes/Week): 135, Intensity: Mild  Goals   None     Fall Risk Fall Risk  10/10/2019 06/15/2019 06/10/2018 02/15/2018 01/24/2016  Falls in the past year? 0 0 0 No No  Comment - Emmi Telephone Survey: data to providers prior to load - - -  Number falls in past yr: 0 - - - -  Injury with Fall? 0 - - - -  Follow up Falls evaluation completed;Education provided;Falls prevention discussed - - - -   Is the patient's home free of loose throw rugs in walkways, pet beds, electrical cords, etc?   yes      Grab bars in the bathroom? yes       Handrails on the stairs?   yes      Adequate lighting?   yes  Timed Get Up and Go Performed: completed and within normal timeframe; no gait abnormalities noted   Depression Screen PHQ 2/9 Scores 10/10/2019 08/10/2019 06/13/2019 06/09/2018  PHQ - 2 Score 0 0 0 0    Cognitive Function     6CIT Screen 10/10/2019  What Year? 0 points  What month? 0 points  What time? 0 points  Count back from 20 0 points  Months in reverse 0 points  Repeat phrase 0 points  Total Score 0    Immunization History  Administered Date(s) Administered  . Fluad Quad(high Dose 65+) 03/29/2019  . Hepatitis A 02/18/1998, 08/21/1998  . Influenza Split 04/01/2011, 05/18/2012  . Influenza, High Dose Seasonal PF 04/23/2016, 03/17/2018  . Influenza, Seasonal, Injecte, Preservative Fre 05/01/2017  . Influenza,inj,Quad PF,6+ Mos 04/28/2013, 04/21/2014  . Influenza-Unspecified 04/21/2015  . PFIZER SARS-COV-2 Vaccination 08/10/2019, 08/31/2019  . Pneumococcal Conjugate-13 08/22/2014  . Pneumococcal Polysaccharide-23 07/21/2002, 07/08/2016  . Tdap 11/18/2005, 01/03/2015    Qualifies for Shingles Vaccine? completed and within normal timeframe; no gait abnormalities noted   Screening Tests Health Maintenance  Topic Date Due  . TETANUS/TDAP  01/02/2025  . COLONOSCOPY  08/25/2027  . INFLUENZA VACCINE  Completed  . Hepatitis C Screening  Completed  . PNA vac Low Risk Adult  Completed   Cancer Screenings: Lung: Low Dose CT Chest recommended if Age 18-80 years, 30 pack-year currently smoking OR have quit w/in 15years. Patient does not qualify. Colorectal: colonoscopy 08/24/17    Plan:  I have personally reviewed and addressed the Medicare Annual Wellness questionnaire and have noted the following in the patient's chart:  A. Medical and social history B. Use of alcohol, tobacco or illicit drugs  C. Current medications and supplements D. Functional ability and status E.  Nutritional status F.  Physical  activity G. Advance directives H. List of other physicians I.  Hospitalizations, surgeries, and ER visits in previous 12 months J.  [place] such as hearing and vision if needed, cognitive and depression L. Referrals, records requested, and appointments- none   In addition, I have reviewed and discussed with patient certain preventive protocols, quality metrics, and best practice recommendations. A written personalized care plan for preventive services as well as general preventive health recommendations were provided to patient.   Signed,  Denman George, LPN  Nurse Health Advisor   Nurse Notes: no additional

## 2019-10-11 DIAGNOSIS — M9902 Segmental and somatic dysfunction of thoracic region: Secondary | ICD-10-CM | POA: Diagnosis not present

## 2019-10-11 DIAGNOSIS — M9903 Segmental and somatic dysfunction of lumbar region: Secondary | ICD-10-CM | POA: Diagnosis not present

## 2019-10-11 DIAGNOSIS — M47812 Spondylosis without myelopathy or radiculopathy, cervical region: Secondary | ICD-10-CM | POA: Diagnosis not present

## 2019-10-11 DIAGNOSIS — M9901 Segmental and somatic dysfunction of cervical region: Secondary | ICD-10-CM | POA: Diagnosis not present

## 2019-10-11 DIAGNOSIS — S29012A Strain of muscle and tendon of back wall of thorax, initial encounter: Secondary | ICD-10-CM | POA: Diagnosis not present

## 2019-10-11 DIAGNOSIS — M47817 Spondylosis without myelopathy or radiculopathy, lumbosacral region: Secondary | ICD-10-CM | POA: Diagnosis not present

## 2019-10-17 ENCOUNTER — Telehealth: Payer: Self-pay

## 2019-10-17 ENCOUNTER — Other Ambulatory Visit: Payer: Self-pay | Admitting: Family Medicine

## 2019-10-17 ENCOUNTER — Other Ambulatory Visit: Payer: Self-pay

## 2019-10-17 MED ORDER — ROSUVASTATIN CALCIUM 20 MG PO TABS: 20.0000 mg | ORAL_TABLET | Freq: Every day | ORAL | 3 refills | Status: DC

## 2019-10-17 NOTE — Telephone Encounter (Signed)
Patient returning missed call. Please return call as soon as possible

## 2019-10-18 ENCOUNTER — Other Ambulatory Visit: Payer: Self-pay

## 2019-10-18 MED ORDER — ROSUVASTATIN CALCIUM 10 MG PO TABS: 10.0000 mg | ORAL_TABLET | Freq: Every day | ORAL | 3 refills | Status: DC

## 2019-10-18 NOTE — Telephone Encounter (Signed)
Spoke with pt to clarify medication refill, and dosage needed to be sent in to pharmacy. I will resend in Crestor 10mg , 3 month supply.

## 2019-11-04 DIAGNOSIS — S29012A Strain of muscle and tendon of back wall of thorax, initial encounter: Secondary | ICD-10-CM | POA: Diagnosis not present

## 2019-11-04 DIAGNOSIS — M47817 Spondylosis without myelopathy or radiculopathy, lumbosacral region: Secondary | ICD-10-CM | POA: Diagnosis not present

## 2019-11-04 DIAGNOSIS — M9903 Segmental and somatic dysfunction of lumbar region: Secondary | ICD-10-CM | POA: Diagnosis not present

## 2019-11-04 DIAGNOSIS — M9901 Segmental and somatic dysfunction of cervical region: Secondary | ICD-10-CM | POA: Diagnosis not present

## 2019-11-04 DIAGNOSIS — M47812 Spondylosis without myelopathy or radiculopathy, cervical region: Secondary | ICD-10-CM | POA: Diagnosis not present

## 2019-11-04 DIAGNOSIS — M9902 Segmental and somatic dysfunction of thoracic region: Secondary | ICD-10-CM | POA: Diagnosis not present

## 2019-11-14 ENCOUNTER — Other Ambulatory Visit: Payer: Self-pay | Admitting: Family Medicine

## 2019-11-14 DIAGNOSIS — I10 Essential (primary) hypertension: Secondary | ICD-10-CM

## 2019-11-24 DIAGNOSIS — L814 Other melanin hyperpigmentation: Secondary | ICD-10-CM | POA: Diagnosis not present

## 2019-11-24 DIAGNOSIS — D1801 Hemangioma of skin and subcutaneous tissue: Secondary | ICD-10-CM | POA: Diagnosis not present

## 2019-11-24 DIAGNOSIS — L570 Actinic keratosis: Secondary | ICD-10-CM | POA: Diagnosis not present

## 2019-11-24 DIAGNOSIS — L821 Other seborrheic keratosis: Secondary | ICD-10-CM | POA: Diagnosis not present

## 2019-11-29 ENCOUNTER — Encounter: Payer: Self-pay | Admitting: Family Medicine

## 2019-12-08 ENCOUNTER — Other Ambulatory Visit: Payer: Self-pay

## 2019-12-08 ENCOUNTER — Ambulatory Visit: Payer: PPO | Admitting: Adult Health

## 2019-12-08 ENCOUNTER — Encounter: Payer: Self-pay | Admitting: Adult Health

## 2019-12-08 VITALS — BP 136/80 | HR 62 | Ht 71.0 in | Wt 175.0 lb

## 2019-12-08 DIAGNOSIS — E785 Hyperlipidemia, unspecified: Secondary | ICD-10-CM | POA: Diagnosis not present

## 2019-12-08 DIAGNOSIS — G459 Transient cerebral ischemic attack, unspecified: Secondary | ICD-10-CM | POA: Diagnosis not present

## 2019-12-08 DIAGNOSIS — M9901 Segmental and somatic dysfunction of cervical region: Secondary | ICD-10-CM | POA: Diagnosis not present

## 2019-12-08 DIAGNOSIS — S29012A Strain of muscle and tendon of back wall of thorax, initial encounter: Secondary | ICD-10-CM | POA: Diagnosis not present

## 2019-12-08 DIAGNOSIS — M47817 Spondylosis without myelopathy or radiculopathy, lumbosacral region: Secondary | ICD-10-CM | POA: Diagnosis not present

## 2019-12-08 DIAGNOSIS — I10 Essential (primary) hypertension: Secondary | ICD-10-CM | POA: Diagnosis not present

## 2019-12-08 DIAGNOSIS — R7303 Prediabetes: Secondary | ICD-10-CM

## 2019-12-08 DIAGNOSIS — M9903 Segmental and somatic dysfunction of lumbar region: Secondary | ICD-10-CM | POA: Diagnosis not present

## 2019-12-08 DIAGNOSIS — M9902 Segmental and somatic dysfunction of thoracic region: Secondary | ICD-10-CM | POA: Diagnosis not present

## 2019-12-08 DIAGNOSIS — M47812 Spondylosis without myelopathy or radiculopathy, cervical region: Secondary | ICD-10-CM | POA: Diagnosis not present

## 2019-12-08 NOTE — Progress Notes (Signed)
[facility] [address] [place]. [place] [address] [phone]       STROKE FOLLOW UP NOTE  [name] Date of Birth:  August 16, 1945 Medical Record Number:  [id number]   Reason for Referral: TIA follow up    CHIEF COMPLAINT:  Chief Complaint  Patient presents with  . Follow-up    TIA, Alone, rm 9, pt states he is doing well    HPI:   Today, 12/08/2019, George Watson returns for follow-up regarding TIAx2 in 06/2019.  He has been stable from a stroke standpoint without new or reoccurring stroke/TIA symptoms.  Continues on aspirin 81mg  daily and Crestor 10 mg daily for secondary stroke prevention. Unable to tolerate Crestor 20mg  dose due to myalgias.  Denies continued myalgias on lower dose.  Prior LDL 59 in 08/10/2019 while on Crestor 20mg . Plans on repeat lab work by PCP next month.  Blood pressure today 136/80.  No concerns at this time.    History provided for reference purposes only Initial visit 08/10/2019 JM: George Watson is a 74 year old male who is being seen today for hospital follow-up.  He has been doing well since discharge without reoccurring or new stroke or TIA symptoms.  He has completed 3 weeks DAPT and continues on aspirin alone without bleeding or bruising.  He does endorse initially experiencing headaches but have subsided after discontinuing 3-week Plavix duration and he believes this could have been an intolerance to Plavix.  He has not experienced any additional headaches.  Continues on Crestor 20 mg daily without myalgias.  He has not had any recent lab work follow-up since discharge.  Blood pressure today 141/83 which is normal level for patient.  No further concerns at this time.  Stroke admission 07/07/2019: George Watson is a 74 y.o. male with history of COPD, hypertension, polysubstance abuse in the past, hepatitis C, hypercholesterolemia, prostate cancer presented on 07/07/2019 with transient R arm weakness. Recurrent transient R sided  weakness and dysmetria in hospital, no tPA as too mild to treat.  Evaluated by Dr. Erlinda Watson and stroke team with stroke work-up completed and symptoms related to TIAx2 possibly related to left M1 50% stenosis as evidenced on CTA.  CTA head/neck negative E LVO but did show severe stenosis right V1, cervical right ICA arthrosclerosis and left M1 ~50% stenosis.  MRIx2 no acute abnormality.  2D echo showed an EF of 55 to 60%.  EEG obtained due to reoccurring symptoms negative for seizure.  Reports similar symptoms on 05/12/2019.  Previously on aspirin 81 mg 3 times weekly and recommended DAPT for 3 weeks and aspirin 81 mg daily alone.  HTN stable.  LDL 125 previously on Crestor 10 mg 3 times weekly and Lovaza and recommended increasing Crestor dosage to 20 mg daily.  History of intolerance of Lipitor.  Prediabetes with A1c 6.5 and recommend close PCP follow-up.  Other stroke risk factors include advanced age and former tobacco use but no prior history of stroke.  He was discharged home in stable condition without therapy needs.   Code Stroke CT head No acute abnormality. ASPECTS 10.     CTA head & neck no ELVO. Severe stenosis RV1. Cervical right ICA atherosclerosis. L M1 ~50% stenosis.  MRI  No acute abnormality   Repeat MRI neg for acute abnormality    2D Echo EF 55 to 60%  EEG after recurrent symptoms negative for Sz  LDL 125  HgbA1c 6.5  UDS neg     ROS:  14 system review of systems performed and negative with exception of no complaints  PMH:  Past Medical History:  Diagnosis Date  . BPH (benign prostatic hypertrophy)   . COPD (chronic obstructive pulmonary disease) ([place])   . DDD (degenerative disc disease), lumbosacral   . Elevated PSA   . Essential hypertension, benign    diet controlled  . Fracture of shaft of clavicle   . GERD (gastroesophageal reflux disease)   . H/O drug abuse ([place])    multisubstance  . Hepatitis C 10/1996  . HSV-2 (herpes simplex virus 2) infection   . Hx  of biopsy 08/2004   liver  . Inguinal hernia    right  . Other and unspecified hyperlipidemia   . Prostate cancer ([place])   . Substance abuse ([place])   . Tuberculosis     PSH:  Past Surgical History:  Procedure Laterality Date  . CERVICAL SPINE SURGERY    . fracture ribs     3  . HERNIA REPAIR    . INGUINAL HERNIA REPAIR  07/01/2012   Procedure: HERNIA REPAIR INGUINAL ADULT;  Surgeon: Madilyn Hook, DO;  Location: [facility];  Service: General;  Laterality: Right;  with Mesh  . left knee meniscus repair  1992  . right rotator cuff  1992  . right shoulder arthroscopy  2011    Social History:  Social History   Socioeconomic History  . Marital status: Married    Spouse name: Not on file  . Number of children: Not on file  . Years of education: Not on file  . Highest education level: Not on file  Occupational History  . Occupation: Geophysicist/field seismologist  . 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 Smoking status: Former Smoker    Years: 43.00    Types: Cigarettes    Quit date: 07/21/2002    Years since quitting: 17.3  . Smokeless tobacco: Never Used  Substance and Sexual Activity  . Alcohol use: No  . Drug use: No    Comment: 26 years ago cocaine, heroin. methadone  . Sexual activity: Yes    Comment: number of sex partners in the last 69 months  1  Other Topics Concern  . Not on file  Social History Narrative      Married to [employer]. Exercise cardio daily for 30 minutes. Education: [employer].   Social Determinants of Health   Financial Resource Strain:   . Difficulty of Paying Living Expenses:   Food Insecurity:   . Worried About Charity fundraiser in the Last Year:   . Arboriculturist in the Last Year:   Transportation Needs:   . Film/video editor (Medical):   Marland Kitchen Lack of Transportation (Non-Medical):   Physical Activity:   . Days of Exercise per Week:   . Minutes of Exercise per Session:   Stress:   . Feeling of Stress :     Social Connections:   . Frequency of Communication with Friends and Family:   . Frequency of Social Gatherings with Friends and Family:   . Attends Religious Services:   . Active Member of Clubs or Organizations:   . Attends Archivist Meetings:   Marland Kitchen Marital Status:   Intimate Partner Violence:   . Fear of Current or Ex-Partner:   . Emotionally Abused:   Marland Kitchen Physically Abused:   . Sexually Abused:     Family History:  Family History  Problem Relation Age of Onset  . Cancer Sister        breast  . Leukemia Mother   . Cancer Father     Medications:   Current Outpatient Medications on File Prior to Visit  Medication Sig Dispense Refill  . albuterol (VENTOLIN HFA) 108 (90 Base) MCG/ACT inhaler INHALE 2 PUFFS INTO THE LUNGS EVERY 6 (SIX) HOURS AS NEEDED FOR SHORTNESS OF BREATH. 18 g 1  . aspirin EC 81 MG EC tablet Take 1 tablet (81 mg total) by mouth daily.    . cholecalciferol (VITAMIN D3) 25 MCG (1000 UT) tablet Take 1,000 Units by mouth daily.    . Coenzyme Q10-Vitamin E 100-300 MG-UNIT CHEW Chew by mouth.    . dutasteride (AVODART) 0.5 MG capsule Take 0.5 mg by mouth daily.    . famotidine (HM FAMOTIDINE) 20 MG tablet Take 20 mg by mouth every evening.     Marland Kitchen glucosamine-chondroitin 500-400 MG tablet Take 3 tablets by mouth daily. (Patient taking differently: Take 2 tablets by mouth daily. )    . Multiple Vitamins-Minerals (CENTRUM SILVER PO) Take by mouth.    . olmesartan (BENICAR) 40 MG tablet TAKE 1 TABLET BY MOUTH EVERY DAY 90 tablet 1  . Omega-3 Fatty Acids (FISH OIL PO) Take 1 tablet by mouth daily.    . predniSONE (DELTASONE) 20 MG tablet Take 2 tablets daily with breakfast. 10 tablet 0  . rosuvastatin (CRESTOR) 10 MG tablet Take 1 tablet (10 mg total) by mouth daily. 90 tablet 3  . tamsulosin (FLOMAX) 0.4 MG CAPS capsule Take 0.4 mg by mouth daily.     Marland Kitchen umeclidinium-vilanterol (ANORO ELLIPTA) 62.5-25 MCG/INH AEPB INHALE 1 PUFF BY MOUTH EVERY DAY 120 each 1    No current facility-administered medications on file prior to visit.    Allergies:  No Known Allergies   Physical Exam  Vitals:   12/08/19 0824  BP: 136/80  Pulse: 62  Weight: 175 lb (79.4 kg)  Height: 5\' 11"  (1.803 m)   Body mass index is 24.41 kg/m. No exam data present  General: well developed, well nourished,  pleasant elderly Caucasian male, seated, in no evident distress Head: head normocephalic and atraumatic.   Neck: supple with no carotid or supraclavicular bruits Cardiovascular: regular rate and rhythm, no murmurs Musculoskeletal: no deformity Skin:  no rash/petichiae Vascular:  Normal pulses all extremities   Neurologic Exam Mental Status: Awake and fully alert.   Normal speech and language.  Oriented to place and time. Recent and remote memory intact. Attention span, concentration and fund of knowledge appropriate. Mood and affect appropriate.  Cranial Nerves: Pupils equal, briskly reactive to light. Extraocular movements full without nystagmus. Visual fields full to confrontation. Hearing intact. Facial sensation intact. Face, tongue, palate moves normally and symmetrically.  Motor: Normal bulk and tone. Normal strength in all tested extremity muscles. Sensory.: intact to touch , pinprick , position and vibratory sensation.  Coordination: Rapid alternating movements normal in all extremities. Finger-to-nose and heel-to-shin performed accurately bilaterally. Gait and Station: Arises from chair without difficulty. Stance is normal. Gait demonstrates normal stride length and balance Reflexes: 1+ and symmetric. Toes downgoing.       ASSESSMENT: George Watson is a 74 y.o. year old male presented with transient right arm weakness with recurrent right-sided weakness and dysmetria in hospital on 07/07/2019 with stroke work-up completed and symptoms likely related to TIAx2 potentially secondary to left M1 50% stenosis. Vascular risk factors include HTN, HLD,  prediabetes, prior tobacco use and intracranial stenosis.  Has been doing well from a stroke standpoint without reoccurring or new stroke/TIA symptoms since discharge    PLAN:  1. TIA: Continue aspirin 81 mg daily  and Crestor 10 mg daily for secondary stroke prevention. Maintain strict control of hypertension with blood pressure goal below 130/90, diabetes with hemoglobin A1c goal below 6.5% and cholesterol with LDL cholesterol (bad cholesterol) goal below 70 mg/dL.  I also advised the patient to eat a healthy diet with plenty of whole grains, cereals, fruits and vegetables, exercise regularly with at least 30 minutes of continuous activity daily and maintain ideal body weight. 2. HTN: Stable.  Continue to follow PCP for monitoring management 3. HLD: Recent LDL 59.  Continue to follow with PCP for ongoing prescribing of Crestor and monitoring management 4. Pre-DMII: Ongoing monitoring by PCP 5. Intercranial stenosis: Continue aspirin and statin along with managing stroke risk factors.  No indication for repeat imaging or surveillance monitoring as he has done well without recurrent symptoms and current treatment plan would not change.  Advise any new or reoccurring stroke/TIA symptoms to call 911 immediately for further evaluation   Overall stable from stroke standpoint and follows with PCP routinely therefore recommend follow-up as needed   I spent 22 minutes of face-to-face and non-face-to-face time with patient.  This included previsit chart review, lab review, study review, order entry, electronic health record documentation, patient education     Frann Rider, [facility]  [facility] [address] [place], [place] [address]  Phone [phone] Fax [phone] Note: This document was prepared with digital dictation and possible smart phrase technology. Any transcriptional errors that result from this process are unintentional.

## 2019-12-08 NOTE — Patient Instructions (Addendum)
Continue aspirin 81 mg daily  and Crestor 10mg  daily for secondary stroke prevention  Continue to follow up with PCP regarding cholesterol and blood pressure management   Maintain strict control of hypertension with blood pressure goal below 130/90, diabetes with hemoglobin A1c goal below 6.5% and cholesterol with LDL cholesterol (bad cholesterol) goal below 70 mg/dL. I also advised the patient to eat a healthy diet with plenty of whole grains, cereals, fruits and vegetables, exercise regularly and maintain ideal body weight.  As you have been stable from a stroke standpoint, you may follow up as needed       Thank you for coming to see [place] at [facility]. I hope we have been able to provide you high quality care today.  You may receive a patient satisfaction survey over the next few weeks. We would appreciate your feedback and comments so that we may continue to improve ourselves and the health of our patients.

## 2019-12-09 NOTE — Progress Notes (Signed)
I agree with the above plan 

## 2019-12-28 ENCOUNTER — Encounter: Payer: Self-pay | Admitting: Emergency Medicine

## 2019-12-28 ENCOUNTER — Other Ambulatory Visit: Payer: Self-pay

## 2019-12-28 ENCOUNTER — Ambulatory Visit: Payer: PPO | Admitting: Emergency Medicine

## 2019-12-28 VITALS — BP 130/70 | HR 67 | Temp 98.2°F | Ht 71.0 in | Wt 176.6 lb

## 2019-12-28 DIAGNOSIS — J449 Chronic obstructive pulmonary disease, unspecified: Secondary | ICD-10-CM | POA: Diagnosis not present

## 2019-12-28 DIAGNOSIS — J418 Mixed simple and mucopurulent chronic bronchitis: Secondary | ICD-10-CM

## 2019-12-28 NOTE — Telephone Encounter (Signed)
Patient attached a copy of his formulary.   RB, please advise. Thanks!

## 2019-12-28 NOTE — Patient Instructions (Addendum)
Continue Anoro once daily for now. Keep albuterol available to use 2 puffs if needed shortness of breath, chest tightness, wheezing.  Continue to use 2 puffs prior to your exercise routine. Please find a copy of your inhaler formulary from your insurance company and sent to [place].  This will help [place] pick a possible substitute for your Anoro that is covered by your insurance. We will perform full pulmonary function testing at your next office visit Follow with Dr. Lamonte Sakai next available with full pulmonary function testing on the same day.

## 2019-12-28 NOTE — Assessment & Plan Note (Signed)
COPD with frequent exacerbations.  He would benefit from an inhaled steroid.  He was on Trelegy before but insurance stopped covering it.  Now he has a new insurance.  I think we need his formulary to help decide which commendation medication to start in place of Anoro.  We will repeat his pulmonary function testing to quantify his degree of obstruction.  Continue Anoro once daily for now. Keep albuterol available to use 2 puffs if needed shortness of breath, chest tightness, wheezing.  Continue to use 2 puffs prior to your exercise routine. Please find a copy of your inhaler formulary from your insurance company and sent to [place].  This will help [place] pick a possible substitute for your Anoro that is covered by your insurance. We will perform full pulmonary function testing at your next office visit Follow with Dr. Lamonte Sakai next available with full pulmonary function testing on the same day

## 2019-12-28 NOTE — Telephone Encounter (Signed)
Based on the formulary, looks like Trelegy is covered and is a good choice.   Lets order Trelegy and see how he does with it. Thanks.

## 2019-12-28 NOTE — Progress Notes (Signed)
Subjective:    Patient ID: George Watson, male    DOB: [birth year], 74 y.o.   MRN: [id number]  HPI 74 year old former smoker (43 pack years), history of hypertension, GERD, hep C, prostate cancer. Was treated with INH for latent TB in 1991.  He was diagnosed with COPD many years ago after a hospitalization for a PNA. He has been on BD's. He averages about 2-3 AE's a year when he requires pred - last was earlier this year.   Has been on Trelegy before but stopped due to insurance. He is currently on Anoro, has albuterol which he uses before exercise but rarely otherwise.   He reports that he exercises, uses his SABA before. Rarely otherwise. Minimal cough or sputum production. Hears wheeze frequently.    Review of Systems As above  Past Medical History:  Diagnosis Date   BPH (benign prostatic hypertrophy)    COPD (chronic obstructive pulmonary disease) (HCC)    DDD (degenerative disc disease), lumbosacral    Elevated PSA    Essential hypertension, benign    diet controlled   Fracture of shaft of clavicle    GERD (gastroesophageal reflux disease)    H/O drug abuse ([place])    multisubstance   Hepatitis C 10/1996   HSV-2 (herpes simplex virus 2) infection    Hx of biopsy 08/2004   liver   Inguinal hernia    right   Other and unspecified hyperlipidemia    Prostate cancer ([place])    Substance abuse ([place])    Tuberculosis      Family History  Problem Relation Age of Onset   Cancer Sister        breast   Leukemia Mother    Cancer Father      Social History   Socioeconomic History   Marital status: Married    Spouse name: Not on file   Number of children: Not on file   Years of education: Not on file   Highest education level: Not on file  Occupational History   Occupation: Geophysicist/field seismologist   Occupation: Retired     Fish farm manager: Theatre manager    Comment: 20+ yrs   Tobacco Use   Smoking status: Former Smoker    Years: 43.00    Types: Cigarettes    Quit  date: 07/21/2002    Years since quitting: 17.4   Smokeless tobacco: Never Used  Substance and Sexual Activity   Alcohol use: No   Drug use: No    Comment: 26 years ago cocaine, heroin. methadone   Sexual activity: Yes    Comment: number of sex partners in the last 12 months  1  Other Topics Concern   Not on file  Social History Narrative      Married to [employer]. Exercise cardio daily for 30 minutes. Education: [employer].   Social Determinants of Health   Financial Resource Strain:    Difficulty of Paying Living Expenses:   Food Insecurity:    Worried About Charity fundraiser in the Last Year:    Arboriculturist in the Last Year:   Transportation Needs:    Film/video editor (Medical):    Lack of Transportation (Non-Medical):   Physical Activity:    Days of Exercise per Week:    Minutes of Exercise per Session:   Stress:    Feeling of Stress :   Social Connections:    Frequency of Communication with Friends and Family:    Frequency of Social  Gatherings with Friends and Family:    Attends Religious Services:    Active Member of Clubs or Organizations:    Attends Music therapist:    Marital Status:   Intimate Partner Violence:    Fear of Current or Ex-Partner:    Emotionally Abused:    Physically Abused:    Sexually Abused:     Has worked in [place] lived in [place], [place], [place] and [place] No there inhaled exposures except tobacco and cocaine (remote)  No Known Allergies   Outpatient Medications Prior to Visit  Medication Sig Dispense Refill   albuterol (VENTOLIN HFA) 108 (90 Base) MCG/ACT inhaler INHALE 2 PUFFS INTO THE LUNGS EVERY 6 (SIX) HOURS AS NEEDED FOR SHORTNESS OF BREATH. 18 g 1   aspirin EC 81 MG EC tablet Take 1 tablet (81 mg total) by mouth daily.     cholecalciferol (VITAMIN D3) 25 MCG (1000 UT) tablet Take 1,000 Units by mouth daily.     Coenzyme Q10-Vitamin E 100-300 MG-UNIT CHEW Chew by mouth.       dutasteride (AVODART) 0.5 MG capsule Take 0.5 mg by mouth daily.     famotidine (HM FAMOTIDINE) 20 MG tablet Take 20 mg by mouth every evening.      glucosamine-chondroitin 500-400 MG tablet Take 3 tablets by mouth daily. (Patient taking differently: Take 2 tablets by mouth daily. )     Multiple Vitamins-Minerals (CENTRUM SILVER PO) Take by mouth.     olmesartan (BENICAR) 40 MG tablet TAKE 1 TABLET BY MOUTH EVERY DAY 90 tablet 1   Omega-3 Fatty Acids (FISH OIL PO) Take 1 tablet by mouth daily.     rosuvastatin (CRESTOR) 10 MG tablet Take 1 tablet (10 mg total) by mouth daily. 90 tablet 3   tamsulosin (FLOMAX) 0.4 MG CAPS capsule Take 0.4 mg by mouth daily.      umeclidinium-vilanterol (ANORO ELLIPTA) 62.5-25 MCG/INH AEPB INHALE 1 PUFF BY MOUTH EVERY DAY 120 each 1   predniSONE (DELTASONE) 20 MG tablet Take 2 tablets daily with breakfast. 10 tablet 0   No facility-administered medications prior to visit.        Objective:   Physical Exam Vitals:   12/28/19 1505  BP: 130/70  Pulse: 67  Temp: 98.2 F (36.8 C)  TempSrc: Oral  SpO2: 98%  Weight: 176 lb 9.6 oz (80.1 kg)  Height: 5\' 11"  (1.803 m)    Gen: Pleasant, well-nourished, in no distress,  normal affect  ENT: No lesions,  mouth clear,  oropharynx clear, no postnasal drip  Neck: No JVD, no stridor  Lungs: No use of accessory muscles, no crackles or wheezing on normal respiration, no wheeze on forced expiration  Cardiovascular: RRR, heart sounds normal, no murmur or gallops, no peripheral edema  Musculoskeletal: No deformities, no cyanosis or clubbing  Neuro: alert, awake, non focal  Skin: Warm, no lesions or rash      Assessment & Plan:  COPD (chronic obstructive pulmonary disease) COPD with frequent exacerbations.  He would benefit from an inhaled steroid.  He was on Trelegy before but insurance stopped covering it.  Now he has a new insurance.  I think we need his formulary to help decide which  commendation medication to start in place of Anoro.  We will repeat his pulmonary function testing to quantify his degree of obstruction.  Continue Anoro once daily for now. Keep albuterol available to use 2 puffs if needed shortness of breath, chest tightness, wheezing.  Continue to  use 2 puffs prior to your exercise routine. Please find a copy of your inhaler formulary from your insurance company and sent to [place].  This will help [place] pick a possible substitute for your Anoro that is covered by your insurance. We will perform full pulmonary function testing at your next office visit Follow with Dr. Lamonte Sakai next available with full pulmonary function testing on the same day  Baltazar Apo, MD, PhD 12/28/2019, 3:40 PM [facility] [phone] or if no answer [phone]

## 2019-12-29 MED ORDER — TRELEGY ELLIPTA 100-62.5-25 MCG/INH IN AEPB: 1.0000 | INHALATION_SPRAY | Freq: Every day | RESPIRATORY_TRACT | 5 refills | Status: DC

## 2020-01-05 DIAGNOSIS — C61 Malignant neoplasm of prostate: Secondary | ICD-10-CM | POA: Diagnosis not present

## 2020-01-10 ENCOUNTER — Other Ambulatory Visit: Payer: Self-pay | Admitting: Family Medicine

## 2020-01-10 DIAGNOSIS — J418 Mixed simple and mucopurulent chronic bronchitis: Secondary | ICD-10-CM

## 2020-01-11 ENCOUNTER — Other Ambulatory Visit: Payer: Self-pay

## 2020-01-11 ENCOUNTER — Other Ambulatory Visit (INDEPENDENT_AMBULATORY_CARE_PROVIDER_SITE_OTHER): Payer: PPO

## 2020-01-11 DIAGNOSIS — E785 Hyperlipidemia, unspecified: Secondary | ICD-10-CM

## 2020-01-11 LAB — LIPID PANEL
Cholesterol: 129 mg/dL (ref 0–200)
HDL: 31.4 mg/dL — ABNORMAL LOW (ref >39.00)
LDL Cholesterol: 74 mg/dL (ref 0–99)
NonHDL: 97.95
Total CHOL/HDL Ratio: 4
Triglycerides: 118 mg/dL (ref 0.0–149.0)
VLDL: 23.6 mg/dL (ref 0.0–40.0)

## 2020-01-12 DIAGNOSIS — M9903 Segmental and somatic dysfunction of lumbar region: Secondary | ICD-10-CM | POA: Diagnosis not present

## 2020-01-12 DIAGNOSIS — M9901 Segmental and somatic dysfunction of cervical region: Secondary | ICD-10-CM | POA: Diagnosis not present

## 2020-01-12 DIAGNOSIS — M47817 Spondylosis without myelopathy or radiculopathy, lumbosacral region: Secondary | ICD-10-CM | POA: Diagnosis not present

## 2020-01-12 DIAGNOSIS — R351 Nocturia: Secondary | ICD-10-CM | POA: Diagnosis not present

## 2020-01-12 DIAGNOSIS — C61 Malignant neoplasm of prostate: Secondary | ICD-10-CM | POA: Diagnosis not present

## 2020-01-12 DIAGNOSIS — M9902 Segmental and somatic dysfunction of thoracic region: Secondary | ICD-10-CM | POA: Diagnosis not present

## 2020-01-12 DIAGNOSIS — M47812 Spondylosis without myelopathy or radiculopathy, cervical region: Secondary | ICD-10-CM | POA: Diagnosis not present

## 2020-01-12 DIAGNOSIS — N401 Enlarged prostate with lower urinary tract symptoms: Secondary | ICD-10-CM | POA: Diagnosis not present

## 2020-01-12 DIAGNOSIS — S29012A Strain of muscle and tendon of back wall of thorax, initial encounter: Secondary | ICD-10-CM | POA: Diagnosis not present

## 2020-01-13 ENCOUNTER — Other Ambulatory Visit: Payer: Self-pay | Admitting: Urology

## 2020-01-13 ENCOUNTER — Other Ambulatory Visit: Payer: Self-pay | Admitting: Family Medicine

## 2020-01-13 ENCOUNTER — Encounter: Payer: Self-pay | Admitting: Family Medicine

## 2020-01-13 DIAGNOSIS — C61 Malignant neoplasm of prostate: Secondary | ICD-10-CM

## 2020-01-13 MED ORDER — EZETIMIBE 10 MG PO TABS
10.0000 mg | ORAL_TABLET | Freq: Every day | ORAL | 3 refills | Status: DC
Start: 2020-01-13 — End: 2020-04-12

## 2020-01-18 ENCOUNTER — Encounter: Payer: Self-pay | Admitting: Family Medicine

## 2020-01-26 DIAGNOSIS — M9903 Segmental and somatic dysfunction of lumbar region: Secondary | ICD-10-CM | POA: Diagnosis not present

## 2020-01-26 DIAGNOSIS — M9902 Segmental and somatic dysfunction of thoracic region: Secondary | ICD-10-CM | POA: Diagnosis not present

## 2020-01-26 DIAGNOSIS — M47817 Spondylosis without myelopathy or radiculopathy, lumbosacral region: Secondary | ICD-10-CM | POA: Diagnosis not present

## 2020-01-26 DIAGNOSIS — M9901 Segmental and somatic dysfunction of cervical region: Secondary | ICD-10-CM | POA: Diagnosis not present

## 2020-01-26 DIAGNOSIS — M47812 Spondylosis without myelopathy or radiculopathy, cervical region: Secondary | ICD-10-CM | POA: Diagnosis not present

## 2020-01-26 DIAGNOSIS — S29012A Strain of muscle and tendon of back wall of thorax, initial encounter: Secondary | ICD-10-CM | POA: Diagnosis not present

## 2020-01-27 ENCOUNTER — Encounter (HOSPITAL_COMMUNITY): Payer: Self-pay

## 2020-01-27 ENCOUNTER — Other Ambulatory Visit: Payer: Self-pay

## 2020-01-27 ENCOUNTER — Ambulatory Visit (HOSPITAL_COMMUNITY)
Admission: EM | Admit: 2020-01-27 | Discharge: 2020-01-27 | Disposition: A | Discharge status: Home / Self Care | Payer: PPO | Attending: Family Medicine | Admitting: Family Medicine

## 2020-01-27 DIAGNOSIS — J441 Chronic obstructive pulmonary disease with (acute) exacerbation: Secondary | ICD-10-CM

## 2020-01-27 MED ORDER — PREDNISONE 20 MG PO TABS
ORAL_TABLET | ORAL | 0 refills | Status: DC
Start: 2020-01-27 — End: 2020-02-13

## 2020-01-27 MED ORDER — AZITHROMYCIN 250 MG PO TABS
ORAL_TABLET | ORAL | 0 refills | Status: DC
Start: 2020-01-27 — End: 2020-02-13

## 2020-01-27 NOTE — ED Triage Notes (Signed)
Pt presents to UC for "ephysema flare". Pt state he feels increased "tightness in his chest. Pt states he takes trelogy daily, and has an albuterol inhaler that he uses prn. Pt endorsed using albuterol PTA, with out relief. Pt states earlier in week he ran "low grade fever to 99.0", and endorses chills. Pt denies body aches, runny nose, cough, sore throat.

## 2020-01-27 NOTE — Discharge Instructions (Addendum)
Take the prednisone for 5 days I have prescribed a Z pak for infection Drink plenty of fluids

## 2020-01-27 NOTE — ED Notes (Signed)
RN to waiting room to assess pt. Pt states his he has emphysema at baseline, feels worse today presented to UC. Pt well appearing, breathing even and unlabored. Pt sating 95 on RA. Pt states baseline sats are 96. Pt able to speak in complete sentences. No accessory muscle use noted.

## 2020-01-27 NOTE — ED Provider Notes (Signed)
George Watson    CSN: [id number] Arrival date & time: 01/27/20  1014      History   Chief Complaint No chief complaint on file.   HPI QUILL GRINDER is a 74 y.o. male.   HPI Patient has known COPD He is under the care of pulmonology He also has a primary care doctor He states that he had some fever on Tuesday of this week, felt better Wednesday and Thursday but then  had some chills last night.  In addition he is having increased shortness of breath.  He is using his inhalers properly.  He is pushing fluids.  He is short of breath at rest and with exertion He has had 2 Covid vaccination He has not had any exposure to illness  Past Medical History:  Diagnosis Date   BPH (benign prostatic hypertrophy)    COPD (chronic obstructive pulmonary disease) (HCC)    DDD (degenerative disc disease), lumbosacral    Elevated PSA    Essential hypertension, benign    diet controlled   Fracture of shaft of clavicle    GERD (gastroesophageal reflux disease)    H/O drug abuse ([place])    multisubstance   Hepatitis C 10/1996   HSV-2 (herpes simplex virus 2) infection    Hx of biopsy 08/2004   liver   Inguinal hernia    right   Other and unspecified hyperlipidemia    Prostate cancer ([place])    Substance abuse ([place])    Tuberculosis     Patient Active Problem List   Diagnosis Date Noted   TIA (transient ischemic attack) 07/07/2019   Dilated aortic root ([place]) 06/13/2019   Pure hypertriglyceridemia 04/08/2019   Fear of flying 02/11/2018   Tear of right rotator cuff 08/31/2017   Prediabetes 01/06/2017   Atherosclerosis of aorta ([place]) 07/09/2016   Degeneration of lumbar or lumbosacral intervertebral disc 02/15/2014   Essential hypertension, benign    Hyperlipidemia with target LDL less than 70    HSV-2 (herpes simplex virus 2) infection    COPD (chronic obstructive pulmonary disease) (HCC)    Benign prostatic hyperplasia    DDD (degenerative disc  disease), lumbosacral    Prostate cancer ([place])    Hepatitis C, chronic ([place]) 10/19/1996    Past Surgical History:  Procedure Laterality Date   CERVICAL SPINE SURGERY     fracture ribs     3   HERNIA REPAIR     INGUINAL HERNIA REPAIR  07/01/2012   Procedure: HERNIA REPAIR INGUINAL ADULT;  Surgeon: Madilyn Hook, DO;  Location: [facility];  Service: General;  Laterality: Right;  with Mesh   left knee meniscus repair  1992   right rotator cuff  1992   right shoulder arthroscopy  2011       Home Medications    Prior to Admission medications   Medication Sig Start Date End Date Taking? Authorizing Provider  albuterol (VENTOLIN HFA) 108 (90 Base) MCG/ACT inhaler INHALE 2 PUFFS INTO THE LUNGS EVERY 6 (SIX) HOURS AS NEEDED FOR SHORTNESS OF BREATH. 04/06/19  Yes Janith Lima, MD  aspirin EC 81 MG EC tablet Take 1 tablet (81 mg total) by mouth daily. 07/09/19  Yes Black, Lezlie Octave, NP  cholecalciferol (VITAMIN D3) 25 MCG (1000 UT) tablet Take 1,000 Units by mouth daily.   Yes [provider]  Coenzyme Q10-Vitamin E 100-300 MG-UNIT CHEW Chew by mouth.   Yes [provider]  dutasteride (AVODART) 0.5 MG capsule Take 0.5 mg  by mouth daily.   Yes [provider]  ezetimibe (ZETIA) 10 MG tablet Take 1 tablet (10 mg total) by mouth daily. 01/13/20  Yes [name], MD  famotidine (HM FAMOTIDINE) 20 MG tablet Take 20 mg by mouth every evening.    Yes [provider]  Fluticasone-Umeclidin-Vilant (TRELEGY ELLIPTA) 100-62.5-25 MCG/INH AEPB Inhale 1 puff into the lungs daily. 12/29/19  Yes Collene Gobble, MD  glucosamine-chondroitin 500-400 MG tablet Take 3 tablets by mouth daily. Patient taking differently: Take 2 tablets by mouth daily.  02/15/18  Yes Marrian Salvage, FNP  Multiple Vitamins-Minerals (CENTRUM SILVER PO) Take by mouth.   Yes [provider]  olmesartan (BENICAR) 40 MG tablet TAKE 1 TABLET BY MOUTH EVERY DAY 11/14/19  Yes [name], MD  Omega-3 Fatty Acids (FISH OIL PO) Take 1 tablet by mouth daily.   Yes [provider]  tamsulosin (FLOMAX) 0.4 MG CAPS capsule Take 0.4 mg by mouth daily.    Yes [provider]  Celedonio Miyamoto 62.5-25 MCG/INH AEPB INHALE 1 PUFF BY MOUTH EVERY DAY Patient not taking: Reported on 01/27/2020 01/10/20   [name], MD  azithromycin (ZITHROMAX Z-PAK) 250 MG tablet Take two pills today followed by one a day until gone 01/27/20   Raylene Everts, MD  predniSONE (DELTASONE) 20 MG tablet Take 2 tablets daily with breakfast. 01/27/20   Raylene Everts, MD    Family History Family History  Problem Relation Age of Onset   Cancer Sister        breast   Leukemia Mother    Cancer Father     Social History Social History   Tobacco Use   Smoking status: Former Smoker    Years: 43.00    Types: Cigarettes    Quit date: 07/21/2002    Years since quitting: 17.5   Smokeless tobacco: Never Used  Vaping Use   Vaping Use: Never used  Substance Use Topics   Alcohol use: No   Drug use: No    Comment: 26 years ago cocaine, heroin. methadone     Allergies   Patient has no known allergies.   Review of Systems Review of Systems See HPI  Physical Exam Triage Vital Signs ED Triage Vitals  Enc Vitals Group     BP 01/27/20 1048 131/71     Pulse Rate 01/27/20 1048 65     Resp 01/27/20 1048 16     Temp 01/27/20 1048 97.7 F (36.5 C)     Temp Source 01/27/20 1048 Oral     SpO2 01/27/20 1048 97 %     Weight --      Height --      Head Circumference --      Peak Flow --      Pain Score 01/27/20 1052 0     Pain Loc --      Pain Edu? --      Excl. in [place]? --    No data found.  Updated Vital Signs BP 131/71 (BP Location: Right Arm)    Pulse 65    Temp 97.7 F (36.5 C) (Oral)    Resp 16    SpO2 97%       Physical Exam Constitutional:      General: He is not in acute distress.    Appearance: Normal appearance. He is well-developed and normal  weight. He is not ill-appearing.  HENT:     Head: Normocephalic and atraumatic.  Right Ear: Tympanic membrane and ear canal normal.     Left Ear: Tympanic membrane and ear canal normal.     Nose: Nose normal. No congestion.  Eyes:     Conjunctiva/sclera: Conjunctivae normal.     Pupils: Pupils are equal, round, and reactive to light.  Cardiovascular:     Rate and Rhythm: Normal rate and regular rhythm.     Heart sounds: Normal heart sounds.  Pulmonary:     Effort: Pulmonary effort is normal. No respiratory distress.     Breath sounds: Wheezing and rhonchi present. No rales.     Comments: Scattered and inspiratory wheeze.  Few anterior rhonchi. Musculoskeletal:        General: Normal range of motion.     Cervical back: Normal range of motion.  Lymphadenopathy:     Cervical: No cervical adenopathy.  Skin:    General: Skin is warm and dry.  Neurological:     Mental Status: He is alert.  Psychiatric:        Mood and Affect: Mood normal.        Behavior: Behavior normal.      UC Treatments / Results  Labs (all labs ordered are listed, but only abnormal results are displayed) Labs Reviewed - No data to display  EKG   Radiology No results found.  Procedures Procedures (including critical care time)  Medications Ordered in UC Medications - No data to display  Initial Impression / Assessment and Plan / UC Course  I have reviewed the triage vital signs and the nursing notes.  Pertinent labs & imaging results that were available during my care of the patient were reviewed by me and considered in my medical decision making (see chart for details).      Final Clinical Impressions(s) / UC Diagnoses   Final diagnoses:  COPD exacerbation ([place])     Discharge Instructions     Take the prednisone for 5 days I have prescribed a Z pak for infection Drink plenty of fluids    ED Prescriptions    Medication Sig Dispense Auth. Provider   predniSONE (DELTASONE) 20  MG tablet Take 2 tablets daily with breakfast. 10 tablet Raylene Everts, MD   azithromycin (ZITHROMAX Z-PAK) 250 MG tablet Take two pills today followed by one a day until gone 6 tablet Meda Coffee Jennette Banker, MD     PDMP not reviewed this encounter.   Raylene Everts, MD 01/27/20 [phone]

## 2020-02-04 ENCOUNTER — Other Ambulatory Visit: Payer: Self-pay | Admitting: Family Medicine

## 2020-02-04 DIAGNOSIS — I10 Essential (primary) hypertension: Secondary | ICD-10-CM

## 2020-02-13 ENCOUNTER — Encounter: Payer: Self-pay | Admitting: Adult Health

## 2020-02-13 ENCOUNTER — Ambulatory Visit: Payer: PPO | Admitting: Adult Health

## 2020-02-13 ENCOUNTER — Ambulatory Visit (INDEPENDENT_AMBULATORY_CARE_PROVIDER_SITE_OTHER): Payer: PPO | Admitting: Emergency Medicine

## 2020-02-13 ENCOUNTER — Other Ambulatory Visit: Payer: Self-pay

## 2020-02-13 ENCOUNTER — Ambulatory Visit: Payer: PPO | Admitting: Emergency Medicine

## 2020-02-13 DIAGNOSIS — J449 Chronic obstructive pulmonary disease, unspecified: Secondary | ICD-10-CM | POA: Diagnosis not present

## 2020-02-13 DIAGNOSIS — J418 Mixed simple and mucopurulent chronic bronchitis: Secondary | ICD-10-CM | POA: Diagnosis not present

## 2020-02-13 LAB — PULMONARY FUNCTION TEST
DL/VA % pred: 109 %
DL/VA: 4.35 ml/min/mmHg/L
DLCO cor % pred: 93 %
DLCO cor: 25.56 ml/min/mmHg
DLCO unc % pred: 93 %
DLCO unc: 25.56 ml/min/mmHg
FEF 25-75 Post: 2.58 L/sec
FEF 25-75 Pre: 2.14 L/sec
FEF2575-%Change-Post: 20 %
FEF2575-%Pred-Post: 100 %
FEF2575-%Pred-Pre: 83 %
FEV1-%Change-Post: 3 %
FEV1-%Pred-Post: 78 %
FEV1-%Pred-Pre: 76 %
FEV1-Post: 2.72 L
FEV1-Pre: 2.63 L
FEV1FVC-%Change-Post: 0 %
FEV1FVC-%Pred-Pre: 104 %
FEV6-%Change-Post: 2 %
FEV6-%Pred-Post: 79 %
FEV6-%Pred-Pre: 77 %
FEV6-Post: 3.53 L
FEV6-Pre: 3.44 L
FEV6FVC-%Change-Post: 0 %
FEV6FVC-%Pred-Post: 105 %
FEV6FVC-%Pred-Pre: 105 %
FVC-%Change-Post: 2 %
FVC-%Pred-Post: 75 %
FVC-%Pred-Pre: 73 %
FVC-Post: 3.54 L
FVC-Pre: 3.44 L
Post FEV1/FVC ratio: 77 %
Post FEV6/FVC ratio: 100 %
Pre FEV1/FVC ratio: 76 %
Pre FEV6/FVC Ratio: 100 %
RV % pred: 99 %
RV: 2.6 L
TLC % pred: 82 %
TLC: 6.11 L

## 2020-02-13 NOTE — Progress Notes (Signed)
@Patient  ID: George Watson, male    DOB: [birth year], 74 y.o.   MRN: [id number]  Chief Complaint  Patient presents with  . Follow-up    COPD     Referring provider: [name], MD  HPI: 74 year old male former smoker seen for pulmonary consult December 28, 2019 for COPD  TEST/EVENTS :   02/13/2020 Follow up : COPD  Patient returns for a 6-week follow-up.  Patient was seen last visit for a pulmonary consult to establish for COPD.  At that time patient was on Anoro daily.  Patient had previously been on Trelegy which seemed to help his breathing better.  But had been changed due to insurance issues.  Patient was set up for pulmonary function testing which was completed this morning that showed improved lung function with FEV1 at 78%, ratio 77, FVC 75%, no significant bronchodilator response, mid flow reversibility.  Of note patient did take his Trelegy this morning prior to test.  In 2016 FEV1 was at 53%. CT chest July 10, 2020 showed biapical pleural and parenchymal scarring with by lateral paraseptal bullous changes. Patient says overall he is very active.  He exercises on a regular basis.  He does at least 1 hour or more exercises every day and does planks most days for at least 3-1/2 minutes.  Patient had a recent COPD exacerbation was seen in urgent care and given a Z-Pak and prednisone taper which he says did help his symptoms.  He has recently been on Trelegy which he feels is really helping his breathing.  No Known Allergies  Immunization History  Administered Date(s) Administered  . Fluad Quad(high Dose 65+) 03/29/2019  . Hepatitis A 02/18/1998, 08/21/1998  . Influenza Split 04/01/2011, 05/18/2012  . Influenza, High Dose Seasonal PF 04/23/2016, 03/17/2018  . Influenza, Seasonal, Injecte, Preservative Fre 05/01/2017  . Influenza,inj,Quad PF,6+ Mos 04/28/2013, 04/21/2014  . Influenza-Unspecified 04/21/2015  . PFIZER SARS-COV-2 Vaccination 08/10/2019, 08/31/2019  .  Pneumococcal Conjugate-13 08/22/2014  . Pneumococcal Polysaccharide-23 07/21/2002, 07/08/2016  . Tdap 11/18/2005, 01/03/2015    Past Medical History:  Diagnosis Date  . BPH (benign prostatic hypertrophy)   . COPD (chronic obstructive pulmonary disease) ([place])   . DDD (degenerative disc disease), lumbosacral   . Elevated PSA   . Essential hypertension, benign    diet controlled  . Fracture of shaft of clavicle   . GERD (gastroesophageal reflux disease)   . H/O drug abuse ([place])    multisubstance  . Hepatitis C 10/1996  . HSV-2 (herpes simplex virus 2) infection   . Hx of biopsy 08/2004   liver  . Inguinal hernia    right  . Other and unspecified hyperlipidemia   . Prostate cancer ([place])   . Substance abuse ([place])   . Tuberculosis     Tobacco History: Social History   Tobacco Use  Smoking Status Former Smoker  . Years: 43.00  . Types: Cigarettes  . Quit date: 07/21/2002  . Years since quitting: 17.5  Smokeless Tobacco Never Used   Counseling given: Not Answered   Outpatient Medications Prior to Visit  Medication Sig Dispense Refill  . albuterol (VENTOLIN HFA) 108 (90 Base) MCG/ACT inhaler INHALE 2 PUFFS INTO THE LUNGS EVERY 6 (SIX) HOURS AS NEEDED FOR SHORTNESS OF BREATH. 18 g 1  . aspirin EC 81 MG EC tablet Take 1 tablet (81 mg total) by mouth daily.    . cholecalciferol (VITAMIN D3) 25 MCG (1000 UT) tablet Take 1,000 Units by mouth daily.    Marland Kitchen  Coenzyme Q10-Vitamin E 100-300 MG-UNIT CHEW Chew by mouth.    . dutasteride (AVODART) 0.5 MG capsule Take 0.5 mg by mouth daily.    Marland Kitchen ezetimibe (ZETIA) 10 MG tablet Take 1 tablet (10 mg total) by mouth daily. 90 tablet 3  . famotidine (HM FAMOTIDINE) 20 MG tablet Take 20 mg by mouth every evening.     . Fluticasone-Umeclidin-Vilant (TRELEGY ELLIPTA) 100-62.5-25 MCG/INH AEPB Inhale 1 puff into the lungs daily. 60 each 5  . glucosamine-chondroitin 500-400 MG tablet Take 3 tablets by mouth daily. (Patient taking differently: Take 2  tablets by mouth daily. )    . losartan (COZAAR) 100 MG tablet Take 1 tablet (100 mg total) by mouth daily. 90 tablet 1  . Multiple Vitamins-Minerals (CENTRUM SILVER PO) Take by mouth.    . Omega-3 Fatty Acids (FISH OIL PO) Take 1 tablet by mouth daily.    . tamsulosin (FLOMAX) 0.4 MG CAPS capsule Take 0.4 mg by mouth daily.     Jearl Klinefelter ELLIPTA 62.5-25 MCG/INH AEPB INHALE 1 PUFF BY MOUTH EVERY DAY (Patient not taking: Reported on 01/27/2020) 120 each 1  . azithromycin (ZITHROMAX Z-PAK) 250 MG tablet Take two pills today followed by one a day until gone 6 tablet 0  . predniSONE (DELTASONE) 20 MG tablet Take 2 tablets daily with breakfast. 10 tablet 0   No facility-administered medications prior to visit.     Review of Systems:   Constitutional:   No  weight loss, night sweats,  Fevers, chills, fatigue, or  lassitude.  HEENT:   No headaches,  Difficulty swallowing,  Tooth/dental problems, or  Sore throat,                No sneezing, itching, ear ache, nasal congestion, post nasal drip,   CV:  No chest pain,  Orthopnea, PND, swelling in lower extremities, anasarca, dizziness, palpitations, syncope.   GI  No heartburn, indigestion, abdominal pain, nausea, vomiting, diarrhea, change in bowel habits, loss of appetite, bloody stools.   Resp:  .  No excess mucus, no productive cough,  No non-productive cough,  No coughing up of blood.  No change in color of mucus.  No wheezing.  No chest wall deformity  Skin: no rash or lesions.  GU: no dysuria, change in color of urine, no urgency or frequency.  No flank pain, no hematuria   MS:  No joint pain or swelling.  No decreased range of motion.  No back pain.    Physical Exam  BP (!) 130/76 (BP Location: Right Arm, Cuff Size: Normal)   Pulse 65   Ht 6' (1.829 m)   Wt 174 lb (78.9 kg)   SpO2 95%   BMI 23.60 kg/m   GEN: A/Ox3; pleasant , NAD, well nourished    HEENT:  [place]/AT,    NOSE-clear, THROAT-clear, no lesions, no postnasal drip or  exudate noted.   NECK:  Supple w/ fair ROM; no JVD; normal carotid impulses w/o bruits; no thyromegaly or nodules palpated; no lymphadenopathy.    RESP  Clear  P & A; w/o, wheezes/ rales/ or rhonchi. no accessory muscle use, no dullness to percussion  CARD:  RRR, no m/r/g, no peripheral edema, pulses intact, no cyanosis or clubbing.  GI:   Soft & nt; nml bowel sounds; no organomegaly or masses detected.   Musco: Warm bil, no deformities or joint swelling noted.   Neuro: alert, no focal deficits noted.    Skin: Warm, no lesions or rashes  Lab Results:  CBC BNP No results found for: BNP  ProBNP No results found for: PROBNP  Imaging: No results found.    PFT Results Latest Ref Rng & Units 02/13/2020  FVC-Pre L 3.44  FVC-Predicted Pre % 73  FVC-Post L 3.54  FVC-Predicted Post % 75  Pre FEV1/FVC % % 76  Post FEV1/FCV % % 77  FEV1-Pre L 2.63  FEV1-Predicted Pre % 76  FEV1-Post L 2.72  DLCO UNC% % 93  DLCO COR %Predicted % 109  TLC L 6.11  TLC % Predicted % 82  RV % Predicted % 99    No results found for: NITRICOXIDE      Assessment & Plan:   COPD (chronic obstructive pulmonary disease) Compensated on present regimen-patient did have a recent flareup however is now back on Trelegy and seems to be doing well.  He is very active and lung function is actually improved since 2016 suspect he has a asthma component.  Plan  Patient Instructions  Continue on TRELEGY 1 puff daily , rinse after use.  Activity as tolerated.  Follow up in 6 months with Dr. Lamonte Sakai  and As needed          Rexene Edison, NP 02/13/2020

## 2020-02-13 NOTE — Patient Instructions (Addendum)
Continue on TRELEGY 1 puff daily , rinse after use.  Activity as tolerated.  Follow up in 6 months with Dr. Byrum  and As needed   

## 2020-02-13 NOTE — Assessment & Plan Note (Addendum)
Compensated on present regimen-patient did have a recent flareup however is now back on Trelegy and seems to be doing well.  He is very active and lung function is actually improved since 2016 suspect he has a asthma component.  Plan  Patient Instructions  Continue on TRELEGY 1 puff daily , rinse after use.  Activity as tolerated.  Follow up in 6 months with Dr. Lamonte Sakai  and As needed

## 2020-02-13 NOTE — Progress Notes (Signed)
Full PFT performed today. °

## 2020-02-15 ENCOUNTER — Other Ambulatory Visit: Payer: Self-pay

## 2020-02-15 ENCOUNTER — Ambulatory Visit
Admission: RE | Admit: 2020-02-15 | Discharge: 2020-02-15 | Disposition: A | Discharge status: Home / Self Care | Payer: PPO | Source: Ambulatory Visit | Attending: Urology | Admitting: Urology

## 2020-02-15 DIAGNOSIS — C61 Malignant neoplasm of prostate: Secondary | ICD-10-CM

## 2020-02-15 DIAGNOSIS — K5730 Diverticulosis of large intestine without perforation or abscess without bleeding: Secondary | ICD-10-CM | POA: Diagnosis not present

## 2020-02-15 MED ORDER — GADOBENATE DIMEGLUMINE 529 MG/ML IV SOLN
16.0000 mL | Freq: Once | INTRAVENOUS | Status: AC | PRN
  Administered 2020-02-15: 16 mL via INTRAVENOUS

## 2020-02-23 DIAGNOSIS — M47817 Spondylosis without myelopathy or radiculopathy, lumbosacral region: Secondary | ICD-10-CM | POA: Diagnosis not present

## 2020-02-23 DIAGNOSIS — M47812 Spondylosis without myelopathy or radiculopathy, cervical region: Secondary | ICD-10-CM | POA: Diagnosis not present

## 2020-02-23 DIAGNOSIS — M9902 Segmental and somatic dysfunction of thoracic region: Secondary | ICD-10-CM | POA: Diagnosis not present

## 2020-02-23 DIAGNOSIS — M9903 Segmental and somatic dysfunction of lumbar region: Secondary | ICD-10-CM | POA: Diagnosis not present

## 2020-02-23 DIAGNOSIS — S29012A Strain of muscle and tendon of back wall of thorax, initial encounter: Secondary | ICD-10-CM | POA: Diagnosis not present

## 2020-02-23 DIAGNOSIS — M9901 Segmental and somatic dysfunction of cervical region: Secondary | ICD-10-CM | POA: Diagnosis not present

## 2020-03-06 ENCOUNTER — Encounter: Payer: Self-pay | Admitting: Family Medicine

## 2020-03-06 ENCOUNTER — Telehealth (INDEPENDENT_AMBULATORY_CARE_PROVIDER_SITE_OTHER): Payer: PPO | Admitting: Family Medicine

## 2020-03-06 VITALS — BP 135/70 | HR 56 | Temp 97.0°F | Ht 72.0 in | Wt 170.0 lb

## 2020-03-06 DIAGNOSIS — R197 Diarrhea, unspecified: Secondary | ICD-10-CM

## 2020-03-06 NOTE — Progress Notes (Signed)
   George Watson is a 74 y.o. male who presents today for a virtual office visit.  Assessment/Plan:  Diarrhea No red flags though given that symptoms have worsened and have been present for over 2 weeks we will check stool sample.  Continue conservative measures with good oral hydration, and Imodium.  Also recommended Pepto-Bismol as needed.  Discussed reasons to return to care.    Subjective:  HPI:  Symptoms started about 2 weeks ago. Tried cutting dairy out of his diet with no improvement. Worsened a week ago. Took imodium and probiotic which seemed to help. No fever. Much worse recently. Some cramping. No fevers. Occurs 4-6 times per daily. Stool is watery. No other sick contacts.        Objective/Observations  Physical Exam: Gen: NAD, resting comfortably Pulm: Normal work of breathing Neuro: Grossly normal, moves all extremities Psych: Normal affect and thought content  Virtual Visit via Video   I connected with [name] on 03/06/20 at 10:40 AM EDT by a video enabled telemedicine application and verified that I am speaking with the correct person using two identifiers. The limitations of evaluation and management by telemedicine and the availability of in person appointments were discussed. The patient expressed understanding and agreed to proceed.   Patient location: Home Provider location: [place] participating in the virtual visit: Myself and Patient     Algis Greenhouse. Jerline Pain, MD 03/06/2020 11:18 AM

## 2020-03-06 NOTE — Addendum Note (Signed)
Addended by: Doran Clay A on: 03/06/2020 12:01 PM   Modules accepted: Orders

## 2020-03-08 ENCOUNTER — Encounter: Payer: Self-pay | Admitting: Family Medicine

## 2020-03-09 ENCOUNTER — Telehealth: Payer: Self-pay

## 2020-03-09 DIAGNOSIS — R197 Diarrhea, unspecified: Secondary | ICD-10-CM

## 2020-03-09 LAB — GASTROINTESTINAL PATHOGEN PANEL PCR
C. difficile Tox A/B, PCR: NOT DETECTED
Campylobacter, PCR: NOT DETECTED
Cryptosporidium, PCR: NOT DETECTED
E coli (ETEC) LT/ST PCR: NOT DETECTED
E coli (STEC) stx1/stx2, PCR: NOT DETECTED
E coli 0157, PCR: NOT DETECTED
Giardia lamblia, PCR: NOT DETECTED
Norovirus, PCR: NOT DETECTED
Rotavirus A, PCR: NOT DETECTED
Salmonella, PCR: NOT DETECTED
Shigella, PCR: NOT DETECTED

## 2020-03-09 MED ORDER — DIPHENOXYLATE-ATROPINE 2.5-0.025 MG PO TABS: 1.0000 | ORAL_TABLET | Freq: Four times a day (QID) | ORAL | 0 refills | Status: DC | PRN

## 2020-03-09 NOTE — Telephone Encounter (Signed)
Let him know I did not order these so they will not come to me and after reviewing his chart they have still not resulted. Would email Dr. Jerline Pain in regards to this as I will not get these in my inbox. Im sorry he is having diarrhea.   Dr. Rogers Blocker

## 2020-03-09 NOTE — Addendum Note (Signed)
Addended by: Vivi Barrack on: 03/09/2020 04:29 PM   Modules accepted: Orders

## 2020-03-09 NOTE — Progress Notes (Signed)
Please inform patient of the following:  Stool sample is negative. Would like for him to let us know if his diarrhea has not improved.  George Watson. Jerline Pain, MD 03/09/2020 4:08 PM

## 2020-03-09 NOTE — Telephone Encounter (Signed)
Please advise 

## 2020-03-09 NOTE — Telephone Encounter (Signed)
I have not seen patient and did not order results. Dr. Jerline Pain needs to address.  [name], MD [place]

## 2020-03-09 NOTE — Telephone Encounter (Signed)
George Watson is calling in again, wondering if there is anything that can be prescribed. Does not know if he can continue with this diarrhea, and no update on the results.

## 2020-03-09 NOTE — Telephone Encounter (Signed)
Pt is wanting his stool sample test results, so he can get medication before we close for the weekend.

## 2020-03-09 NOTE — Telephone Encounter (Signed)
I spoke with the pt to give message below. I explained that when the results come back, it is reviewed by the ordering Provider, who then sends it to the [facility] to call for results.  He voiced understanding.

## 2020-03-09 NOTE — Telephone Encounter (Signed)
Pt notified [facility] has GI appointment on Monday

## 2020-03-09 NOTE — Telephone Encounter (Signed)
Stool studies showed no infectious cause for his diarrhea. I will send in a prescription strength antidiarreal medication.  Would also like for him to see GI to further evaluate. I will place the referral.  Algis Greenhouse. Jerline Pain, MD 03/09/2020 4:29 PM

## 2020-03-09 NOTE — Telephone Encounter (Signed)
Pt called to check on status of test results again. I informed him that we would call once results were in. He expressed his fear of going all weekend with no treatment plan

## 2020-03-12 DIAGNOSIS — K7469 Other cirrhosis of liver: Secondary | ICD-10-CM | POA: Diagnosis not present

## 2020-03-12 DIAGNOSIS — B182 Chronic viral hepatitis C: Secondary | ICD-10-CM | POA: Diagnosis not present

## 2020-03-12 DIAGNOSIS — B1920 Unspecified viral hepatitis C without hepatic coma: Secondary | ICD-10-CM | POA: Diagnosis not present

## 2020-03-15 DIAGNOSIS — R152 Fecal urgency: Secondary | ICD-10-CM | POA: Diagnosis not present

## 2020-03-15 DIAGNOSIS — K7469 Other cirrhosis of liver: Secondary | ICD-10-CM | POA: Diagnosis not present

## 2020-03-15 DIAGNOSIS — R197 Diarrhea, unspecified: Secondary | ICD-10-CM | POA: Diagnosis not present

## 2020-03-21 DIAGNOSIS — R152 Fecal urgency: Secondary | ICD-10-CM | POA: Diagnosis not present

## 2020-03-21 DIAGNOSIS — R197 Diarrhea, unspecified: Secondary | ICD-10-CM | POA: Diagnosis not present

## 2020-03-22 DIAGNOSIS — R197 Diarrhea, unspecified: Secondary | ICD-10-CM | POA: Diagnosis not present

## 2020-03-22 DIAGNOSIS — J069 Acute upper respiratory infection, unspecified: Secondary | ICD-10-CM | POA: Diagnosis not present

## 2020-03-22 DIAGNOSIS — R6889 Other general symptoms and signs: Secondary | ICD-10-CM | POA: Diagnosis not present

## 2020-03-22 DIAGNOSIS — R509 Fever, unspecified: Secondary | ICD-10-CM | POA: Diagnosis not present

## 2020-03-30 DIAGNOSIS — R197 Diarrhea, unspecified: Secondary | ICD-10-CM | POA: Diagnosis not present

## 2020-04-01 ENCOUNTER — Encounter: Payer: Self-pay | Admitting: Family Medicine

## 2020-04-02 ENCOUNTER — Telehealth: Payer: PPO | Admitting: Family Medicine

## 2020-04-12 ENCOUNTER — Other Ambulatory Visit: Payer: Self-pay

## 2020-04-12 ENCOUNTER — Ambulatory Visit (INDEPENDENT_AMBULATORY_CARE_PROVIDER_SITE_OTHER): Payer: PPO | Admitting: Family Medicine

## 2020-04-12 ENCOUNTER — Encounter: Payer: Self-pay | Admitting: Family Medicine

## 2020-04-12 VITALS — BP 140/70 | HR 74 | Temp 98.7°F | Ht 72.0 in | Wt 167.0 lb

## 2020-04-12 DIAGNOSIS — I10 Essential (primary) hypertension: Secondary | ICD-10-CM

## 2020-04-12 DIAGNOSIS — R197 Diarrhea, unspecified: Secondary | ICD-10-CM | POA: Diagnosis not present

## 2020-04-12 DIAGNOSIS — R7303 Prediabetes: Secondary | ICD-10-CM

## 2020-04-12 DIAGNOSIS — E785 Hyperlipidemia, unspecified: Secondary | ICD-10-CM

## 2020-04-12 MED ORDER — ROSUVASTATIN CALCIUM 5 MG PO TABS
5.0000 mg | ORAL_TABLET | Freq: Every day | ORAL | 3 refills | Status: DC
Start: 2020-04-12 — End: 2021-04-08

## 2020-04-12 NOTE — Patient Instructions (Addendum)
-  a1c today -stop zetia x 2 weeks and see if this helps diarrhea as well as tumeric.  -if you're willing we can start back crestor 5mg  daily and see if this is better with the leg cramps. If not we can do every other day and go from there.   Look great. See you in 6 months or as needed.  Stay safe! Dr. Rogers Blocker

## 2020-04-12 NOTE — Progress Notes (Signed)
Patient: George Watson MRN: [id number] DOB: 03-01-1946 PCP: [name], MD     Subjective:  Chief Complaint  Patient presents with  . Prediabetes  . Hypertension  . Hyperlipidemia    HPI: The patient is a 74 y.o. male who presents today for Pre diabetes and HTN.  Hypertension: Here for follow up of hypertension.  Currently on cozaar 100mg /day. Home readings range from 767 HALPFXTK/24 diastolic. Takes medication as prescribed and denies any side effects. Exercise includes biking and body weight and swimming. Weight has been stable. Denies any chest pain, headaches, shortness of breath, vision changes, swelling in lower extremities.   Prediabetes -last a1c was 6.4. he is diet controlled. Due for lab check today.   Hyperlipidemia -couldn't tolerate the statin so we changed him to zetia. Wonder if contributing to his diarrhea.    Review of Systems  HENT: Negative for congestion and sore throat.   Respiratory: Negative for cough and shortness of breath.   Cardiovascular: Negative for chest pain and palpitations.  Gastrointestinal: Positive for diarrhea.  Neurological: Negative for dizziness, light-headedness and headaches.    Allergies Patient has No Known Allergies.  Past Medical History Patient  has a past medical history of BPH (benign prostatic hypertrophy), COPD (chronic obstructive pulmonary disease) ([place]), DDD (degenerative disc disease), lumbosacral, Elevated PSA, Essential hypertension, benign, Fracture of shaft of clavicle, GERD (gastroesophageal reflux disease), H/O drug abuse ([place]), Hepatitis C (10/1996), HSV-2 (herpes simplex virus 2) infection, biopsy (08/2004), Inguinal hernia, Other and unspecified hyperlipidemia, Prostate cancer ([place]), Substance abuse ([place]), and Tuberculosis.  Surgical History Patient  has a past surgical history that includes fracture ribs; Cervical spine surgery; right shoulder arthroscopy (2011); right rotator cuff (1992); left knee  meniscus repair (1992); Inguinal hernia repair (07/01/2012); and Hernia repair.  Family History [name]'s family history includes Cancer in his father and sister; Leukemia in his mother.  Social History Patient  reports that he quit smoking about 17 years ago. His smoking use included cigarettes. He quit after 43.00 years of use. He has never used smokeless tobacco. He reports that he does not drink alcohol and does not use drugs.    Objective: Vitals:   04/12/20 1324 04/12/20 1411  BP: (!) 146/68 140/70  Pulse: 74   Temp: 98.7 F (37.1 C)   TempSrc: Temporal   SpO2: 95%   Weight: 167 lb (75.8 kg)   Height: 6' (1.829 m)     Body mass index is 22.65 kg/m.  Physical Exam Vitals reviewed.  Constitutional:      Appearance: Normal appearance. He is normal weight.  HENT:     Head: Normocephalic and atraumatic.  Cardiovascular:     Rate and Rhythm: Regular rhythm.     Heart sounds: Normal heart sounds.  Pulmonary:     Effort: Pulmonary effort is normal.     Breath sounds: Normal breath sounds.  Abdominal:     General: Abdomen is flat. Bowel sounds are normal.     Palpations: Abdomen is soft.     Tenderness: There is no abdominal tenderness.  Neurological:     General: No focal deficit present.     Mental Status: He is alert and oriented to person, place, and time.  Psychiatric:        Mood and Affect: Mood normal.        Behavior: Behavior normal.      Office Visit from 04/12/2020 in [facility]  PHQ-2 Total Score 0  Assessment/plan: 1. Essential hypertension, benign Near goal. Will continue to monitor. Has been to goal at his other appointment. Labs reviewed from his GI (cbc/cmp) and all to goal. No refills needed. F/u in 6 months with fasting labs.   2. Prediabetes Due for a1c. Checking today.   3. Hyperlipidemia with target LDL less than 70 Stopping zetia x 2 weeks ot see if helps with diarrhea. We are also going to see if he can  tolerate crestor 5mg  daily as the 10mg  gave him leg cramps. After 2 weeks he can start this up and we will just check labs at f/u in 6 months.  - Lipid panel; Future  -diarrhea being monitored by his GI doc and work up being done.   This visit occurred during the SARS-CoV-2 public health emergency.  Safety protocols were in place, including screening questions prior to the visit, additional usage of staff PPE, and extensive cleaning of exam room while observing appropriate contact time as indicated for disinfecting solutions.     Return in about 6 months (around 10/10/2020) for fasting labs/chol/a1c/htn .    [name], MD [place]   04/12/2020

## 2020-04-13 LAB — HEMOGLOBIN A1C
Hgb A1c MFr Bld: 5.7 % of total Hgb — ABNORMAL HIGH (ref <5.7)
Mean Plasma Glucose: 117 (calc)
eAG (mmol/L): 6.5 (calc)

## 2020-04-14 ENCOUNTER — Encounter: Payer: Self-pay | Admitting: Family Medicine

## 2020-04-15 MED ORDER — MUPIROCIN CALCIUM 2 % EX CREA: 1.0000 "application " | TOPICAL_CREAM | Freq: Two times a day (BID) | CUTANEOUS | 1 refills | Status: DC

## 2020-04-16 ENCOUNTER — Other Ambulatory Visit: Payer: Self-pay | Admitting: Internal Medicine

## 2020-04-16 DIAGNOSIS — J418 Mixed simple and mucopurulent chronic bronchitis: Secondary | ICD-10-CM

## 2020-04-18 ENCOUNTER — Encounter: Payer: Self-pay | Admitting: Family Medicine

## 2020-04-27 DIAGNOSIS — A044 Other intestinal Escherichia coli infections: Secondary | ICD-10-CM | POA: Diagnosis not present

## 2020-05-04 DIAGNOSIS — Z961 Presence of intraocular lens: Secondary | ICD-10-CM | POA: Diagnosis not present

## 2020-05-06 ENCOUNTER — Encounter: Payer: Self-pay | Admitting: Family Medicine

## 2020-05-06 DIAGNOSIS — A044 Other intestinal Escherichia coli infections: Secondary | ICD-10-CM

## 2020-05-08 ENCOUNTER — Ambulatory Visit: Payer: PPO | Admitting: Internal Medicine

## 2020-05-08 ENCOUNTER — Other Ambulatory Visit: Payer: Self-pay

## 2020-05-08 ENCOUNTER — Encounter: Payer: Self-pay | Admitting: Internal Medicine

## 2020-05-08 DIAGNOSIS — K529 Noninfective gastroenteritis and colitis, unspecified: Secondary | ICD-10-CM | POA: Diagnosis not present

## 2020-05-08 NOTE — Progress Notes (Signed)
[facility] for Infectious Disease      Reason for Consult: diarrhea    Referring Physician: Dr. Rogers Blocker    Patient ID: George Watson, male    DOB: 1946-03-07, 74 y.o.   MRN: [id number]  HPI:   He is here for evaluation of persistent diarrhea.  His diarrhea initially started in early August, particularly after travel to the beach.  This then persisted with more than 10 stools a day associated with about an 8 lb weight loss over 9 weeks.  He saw his PCPs office in mid August and stool pathogen panel was negative and he was given supportive care.  He later saw his gastroenterologist for his routine cirrhosis follow up and evaluated again for this and stool pathogen panel positive for enteropathogenic E coli.  He did not have any significant fever.  No blood in stools.  He did receive a 'Z pack for presumed acute exacerbation of chronic bronchitis at one point and this was repeated.  He did not get a high dose of 1 gram.  He then later got a course of cipro for 5 days.  His diarrhea is improving some with more formed stools and less frequency.  Some mild bloating with stools.  He has gained some of his weight back.    Past Medical History:  Diagnosis Date  . BPH (benign prostatic hypertrophy)   . COPD (chronic obstructive pulmonary disease) ([place])   . DDD (degenerative disc disease), lumbosacral   . Elevated PSA   . Essential hypertension, benign    diet controlled  . Fracture of shaft of clavicle   . GERD (gastroesophageal reflux disease)   . H/O drug abuse ([place])    multisubstance  . Hepatitis C 10/1996  . HSV-2 (herpes simplex virus 2) infection   . Hx of biopsy 08/2004   liver  . Inguinal hernia    right  . Other and unspecified hyperlipidemia   . Prostate cancer ([place])   . Substance abuse ([place])   . Tuberculosis     Prior to Admission medications   Medication Sig Start Date End Date Taking? Authorizing Provider  albuterol (VENTOLIN HFA) 108 (90 Base) MCG/ACT inhaler INHALE  2 PUFFS INTO THE LUNGS EVERY 6 (SIX) HOURS AS NEEDED FOR SHORTNESS OF BREATH. 04/06/19  Yes Janith Lima, MD  aspirin EC 81 MG EC tablet Take 1 tablet (81 mg total) by mouth daily. 07/09/19  Yes Black, Lezlie Octave, NP  cholecalciferol (VITAMIN D3) 25 MCG (1000 UT) tablet Take 1,000 Units by mouth daily.   Yes [provider]  Coenzyme Q10-Vitamin E 100-300 MG-UNIT CHEW Chew by mouth.   Yes [provider]  diphenoxylate-atropine (LOMOTIL) 2.5-0.025 MG tablet Take 1 tablet by mouth 4 (four) times daily as needed for diarrhea or loose stools. 03/09/20  Yes Vivi Barrack, MD  dutasteride (AVODART) 0.5 MG capsule Take 0.5 mg by mouth daily.   Yes [provider]  famotidine (HM FAMOTIDINE) 20 MG tablet Take 20 mg by mouth every evening.    Yes [provider]  Fluticasone-Umeclidin-Vilant (TRELEGY ELLIPTA) 100-62.5-25 MCG/INH AEPB Inhale 1 puff into the lungs daily. 12/29/19  Yes Collene Gobble, MD  glucosamine-chondroitin 500-400 MG tablet Take 3 tablets by mouth daily. Patient taking differently: Take 2 tablets by mouth daily.  02/15/18  Yes Marrian Salvage, FNP  losartan (COZAAR) 100 MG tablet Take 1 tablet (100 mg total) by mouth daily. 02/04/20  Yes [name], MD  Multiple Vitamins-Minerals (CENTRUM SILVER PO) Take by mouth.   Yes [provider]  mupirocin cream (BACTROBAN) 2 % Apply 1 application topically 2 (two) times daily. 04/15/20  Yes [name], MD  Omega-3 Fatty Acids (FISH OIL PO) Take 1 tablet by mouth daily.   Yes [provider]  rosuvastatin (CRESTOR) 5 MG tablet Take 1 tablet (5 mg total) by mouth daily. 04/12/20  Yes [name], MD  tamsulosin (FLOMAX) 0.4 MG CAPS capsule Take 0.4 mg by mouth daily.    Yes [provider]  TURMERIC CURCUMIN PO Take by mouth.   Yes [provider]  olmesartan (BENICAR) 40 MG tablet TAKE 1 TABLET BY MOUTH EVERY DAY 11/14/19 02/04/20  [name], MD    No Known  Allergies  Social History   Tobacco Use  . Smoking status: Former Smoker    Years: 43.00    Types: Cigarettes    Quit date: 07/21/2002    Years since quitting: 17.8  . Smokeless tobacco: Never Used  Vaping Use  . Vaping Use: Never used  Substance Use Topics  . Alcohol use: No  . Drug use: No    Comment: 26 years ago cocaine, heroin. methadone    Family History  Problem Relation Age of Onset  . Cancer Sister        breast  . Leukemia Mother   . Cancer Father      Review of Systems  Constitutional: negative for fevers, chills and anorexia Gastrointestinal: negative for nausea All other systems reviewed and are negative    Constitutional: in no apparent distress  Vitals:   05/08/20 0850  BP: (!) 159/75  Pulse: 62  SpO2: 98%   EYES: anicteric GI: non-distended Musculoskeletal: no pedal edema noted Skin: negatives: no rash Neuro: non-focal  Labs: Lab Results  Component Value Date   WBC 10.0 10/10/2019   HGB 15.9 10/10/2019   HCT 46.9 10/10/2019   MCV 91.2 10/10/2019   PLT 259.0 10/10/2019    Lab Results  Component Value Date   CREATININE 0.99 10/10/2019   BUN 26 (H) 10/10/2019   NA 138 10/10/2019   K 4.7 10/10/2019   CL 104 10/10/2019   CO2 26 10/10/2019    Lab Results  Component Value Date   ALT 23 10/10/2019   AST 19 10/10/2019   ALKPHOS 58 10/10/2019   BILITOT 0.6 10/10/2019   INR 1.0 07/07/2019     Assessment: E coli, enteropathogenic, s/p treatment.  He has had a positive stool PCR x 2 and has been treated with appropriate antibiotics multiple times including azithromycin x 2 rounds and cipro for 5 days.  Typically this is a self-limited infection though treatment indicated with prolonged symptoms > 7 days, fever or associated weight loss, and his symptoms were certainly prolonged.  However, at this point, he has been treated and I anticipate 3-6 months of recovery both from the infection and treatment with antibiotics and he may continue to have  intermittent bouts of diarrhea and bloating but overall should continue with weight gain, more formed stools and less diarrhea.  He also should avoid unnecessary antibiotics that disrupt his gut flora and can potentially set him back.    Plan: 1) continue supportive care If he develops new concerning symptoms, he may need colonoscopy for ? Of other etiology but I anticipate he will continue to improve over time if he is able to avoid antibiotics.   No indication to recheck his stool studies at this  time.  He is in agreement with this plan.  Return as needed

## 2020-05-08 NOTE — Telephone Encounter (Signed)
FYI, and please advise.

## 2020-05-15 ENCOUNTER — Encounter: Payer: Self-pay | Admitting: Family Medicine

## 2020-05-24 DIAGNOSIS — M9901 Segmental and somatic dysfunction of cervical region: Secondary | ICD-10-CM | POA: Diagnosis not present

## 2020-05-24 DIAGNOSIS — S29012A Strain of muscle and tendon of back wall of thorax, initial encounter: Secondary | ICD-10-CM | POA: Diagnosis not present

## 2020-05-24 DIAGNOSIS — M9903 Segmental and somatic dysfunction of lumbar region: Secondary | ICD-10-CM | POA: Diagnosis not present

## 2020-05-24 DIAGNOSIS — M47817 Spondylosis without myelopathy or radiculopathy, lumbosacral region: Secondary | ICD-10-CM | POA: Diagnosis not present

## 2020-05-24 DIAGNOSIS — M9902 Segmental and somatic dysfunction of thoracic region: Secondary | ICD-10-CM | POA: Diagnosis not present

## 2020-05-24 DIAGNOSIS — M47812 Spondylosis without myelopathy or radiculopathy, cervical region: Secondary | ICD-10-CM | POA: Diagnosis not present

## 2020-05-28 ENCOUNTER — Encounter: Payer: Self-pay | Admitting: Family Medicine

## 2020-05-29 ENCOUNTER — Encounter: Payer: Self-pay | Admitting: Family Medicine

## 2020-05-30 ENCOUNTER — Encounter: Payer: Self-pay | Admitting: Family Medicine

## 2020-05-31 DIAGNOSIS — D225 Melanocytic nevi of trunk: Secondary | ICD-10-CM | POA: Diagnosis not present

## 2020-05-31 DIAGNOSIS — L570 Actinic keratosis: Secondary | ICD-10-CM | POA: Diagnosis not present

## 2020-05-31 DIAGNOSIS — L821 Other seborrheic keratosis: Secondary | ICD-10-CM | POA: Diagnosis not present

## 2020-05-31 DIAGNOSIS — L820 Inflamed seborrheic keratosis: Secondary | ICD-10-CM | POA: Diagnosis not present

## 2020-06-01 ENCOUNTER — Other Ambulatory Visit: Payer: Self-pay | Admitting: *Deleted

## 2020-06-01 DIAGNOSIS — I712 Thoracic aortic aneurysm, without rupture, unspecified: Secondary | ICD-10-CM

## 2020-06-03 ENCOUNTER — Encounter: Payer: Self-pay | Admitting: Family Medicine

## 2020-06-04 ENCOUNTER — Encounter: Payer: Self-pay | Admitting: Family Medicine

## 2020-06-08 ENCOUNTER — Other Ambulatory Visit: Payer: Self-pay | Admitting: Family Medicine

## 2020-06-08 DIAGNOSIS — A09 Infectious gastroenteritis and colitis, unspecified: Secondary | ICD-10-CM

## 2020-06-11 ENCOUNTER — Other Ambulatory Visit: Payer: PPO

## 2020-06-11 DIAGNOSIS — A09 Infectious gastroenteritis and colitis, unspecified: Secondary | ICD-10-CM

## 2020-06-19 ENCOUNTER — Encounter: Payer: Self-pay | Admitting: Family Medicine

## 2020-06-20 ENCOUNTER — Encounter: Payer: Self-pay | Admitting: Family Medicine

## 2020-06-21 ENCOUNTER — Other Ambulatory Visit: Payer: Self-pay | Admitting: Family Medicine

## 2020-06-21 ENCOUNTER — Other Ambulatory Visit: Payer: Self-pay | Admitting: Internal Medicine

## 2020-06-21 DIAGNOSIS — I10 Essential (primary) hypertension: Secondary | ICD-10-CM

## 2020-06-21 DIAGNOSIS — J418 Mixed simple and mucopurulent chronic bronchitis: Secondary | ICD-10-CM

## 2020-06-22 ENCOUNTER — Other Ambulatory Visit: Payer: Self-pay | Admitting: Family Medicine

## 2020-06-22 ENCOUNTER — Other Ambulatory Visit: Payer: Self-pay

## 2020-06-22 DIAGNOSIS — A09 Infectious gastroenteritis and colitis, unspecified: Secondary | ICD-10-CM | POA: Diagnosis not present

## 2020-06-22 NOTE — Telephone Encounter (Signed)
Duplicate message. Pt has returned to office with new sample.

## 2020-06-26 ENCOUNTER — Encounter: Payer: Self-pay | Admitting: Family Medicine

## 2020-06-27 ENCOUNTER — Other Ambulatory Visit: Payer: Self-pay | Admitting: Emergency Medicine

## 2020-06-27 LAB — SALMONELLA/SHIGELLA CULT, CAMPY EIA AND SHIGA TOXIN RFL ECOLI
MICRO NUMBER: 11274799
MICRO NUMBER:: 11274737
MICRO NUMBER:: 11274800
Result:: NOT DETECTED
SHIGA RESULT:: NOT DETECTED
SPECIMEN QUALITY: ADEQUATE
SPECIMEN QUALITY:: ADEQUATE
SPECIMEN QUALITY:: ADEQUATE

## 2020-06-27 LAB — GASTROINTESTINAL PATHOGEN PANEL PCR
C. difficile Tox A/B, PCR: NOT DETECTED
Campylobacter, PCR: NOT DETECTED
Cryptosporidium, PCR: NOT DETECTED
E coli (ETEC) LT/ST PCR: NOT DETECTED
E coli (STEC) stx1/stx2, PCR: NOT DETECTED
E coli 0157, PCR: NOT DETECTED
Giardia lamblia, PCR: NOT DETECTED
Norovirus, PCR: NOT DETECTED
Rotavirus A, PCR: NOT DETECTED
Salmonella, PCR: NOT DETECTED
Shigella, PCR: NOT DETECTED

## 2020-07-02 DIAGNOSIS — C61 Malignant neoplasm of prostate: Secondary | ICD-10-CM | POA: Diagnosis not present

## 2020-07-11 ENCOUNTER — Ambulatory Visit
Admission: RE | Admit: 2020-07-11 | Discharge: 2020-07-11 | Disposition: A | Discharge status: Home / Self Care | Payer: PPO | Source: Ambulatory Visit | Attending: Surgery | Admitting: Surgery

## 2020-07-11 ENCOUNTER — Encounter: Payer: Self-pay | Admitting: Surgery

## 2020-07-11 ENCOUNTER — Encounter: Payer: PPO | Admitting: Surgery

## 2020-07-11 DIAGNOSIS — I712 Thoracic aortic aneurysm, without rupture, unspecified: Secondary | ICD-10-CM

## 2020-07-11 MED ORDER — IOPAMIDOL (ISOVUE-370) INJECTION 76%
75.0000 mL | Freq: Once | INTRAVENOUS | Status: AC | PRN
  Administered 2020-07-11: 75 mL via INTRAVENOUS

## 2020-07-12 ENCOUNTER — Other Ambulatory Visit: Payer: Self-pay

## 2020-07-12 ENCOUNTER — Encounter (HOSPITAL_COMMUNITY): Payer: Self-pay

## 2020-07-12 ENCOUNTER — Ambulatory Visit (HOSPITAL_COMMUNITY)
Admission: EM | Admit: 2020-07-12 | Discharge: 2020-07-12 | Disposition: A | Discharge status: Home / Self Care | Payer: PPO | Attending: Family Medicine | Admitting: Family Medicine

## 2020-07-12 DIAGNOSIS — R062 Wheezing: Secondary | ICD-10-CM | POA: Insufficient documentation

## 2020-07-12 DIAGNOSIS — Z20822 Contact with and (suspected) exposure to covid-19: Secondary | ICD-10-CM | POA: Insufficient documentation

## 2020-07-12 DIAGNOSIS — R918 Other nonspecific abnormal finding of lung field: Secondary | ICD-10-CM

## 2020-07-12 DIAGNOSIS — J438 Other emphysema: Secondary | ICD-10-CM | POA: Diagnosis not present

## 2020-07-12 LAB — SARS CORONAVIRUS 2 (TAT 6-24 HRS): SARS Coronavirus 2: NEGATIVE

## 2020-07-12 NOTE — ED Triage Notes (Signed)
Pt presents with emphysema flare up X 2 days; pt complains of mild chest tightness and shortness of breath.

## 2020-07-12 NOTE — ED Provider Notes (Signed)
George Watson    CSN: [id number] Arrival date & time: 07/12/20  1304      History   Chief Complaint Chief Complaint  Patient presents with  . Emphysema    HPI George Watson is a 74 y.o. male.   Here today with 2 day history of chest tightness, SOB, wheezing worst with exercise, nasal congestion. Denies fever, chills, cough, CP, abdominal pain, N/V/D. Hx of COPD on trelegy and albuterol, using both with relief. Denies new sick contacts, UTD on vaccines. Had CT chest in the last day or so which did not show any acute changes in lungs.      Past Medical History:  Diagnosis Date  . BPH (benign prostatic hypertrophy)   . COPD (chronic obstructive pulmonary disease) ([place])   . DDD (degenerative disc disease), lumbosacral   . Elevated PSA   . Essential hypertension, benign    diet controlled  . Fracture of shaft of clavicle   . GERD (gastroesophageal reflux disease)   . H/O drug abuse ([place])    multisubstance  . Hepatitis C 10/1996  . HSV-2 (herpes simplex virus 2) infection   . Hx of biopsy 08/2004   liver  . Inguinal hernia    right  . Other and unspecified hyperlipidemia   . Prostate cancer ([place])   . Substance abuse ([place])   . Tuberculosis     Patient Active Problem List   Diagnosis Date Noted  . Gastroenteritis 05/08/2020  . TIA (transient ischemic attack) 07/07/2019  . Dilated aortic root ([place]) 06/13/2019  . Pure hypertriglyceridemia 04/08/2019  . Fear of flying 02/11/2018  . Tear of right rotator cuff 08/31/2017  . Prediabetes 01/06/2017  . Atherosclerosis of aorta ([place]) 07/09/2016  . Degeneration of lumbar or lumbosacral intervertebral disc 02/15/2014  . Essential hypertension, benign   . Hyperlipidemia with target LDL less than 70   . HSV-2 (herpes simplex virus 2) infection   . COPD (chronic obstructive pulmonary disease) ([place])   . Benign prostatic hyperplasia   . DDD (degenerative disc disease), lumbosacral   . Prostate cancer ([place])   .  Hepatitis C, chronic ([place]) 10/19/1996    Past Surgical History:  Procedure Laterality Date  . CERVICAL SPINE SURGERY    . fracture ribs     3  . HERNIA REPAIR    . INGUINAL HERNIA REPAIR  07/01/2012   Procedure: HERNIA REPAIR INGUINAL ADULT;  Surgeon: Madilyn Hook, DO;  Location: [facility];  Service: General;  Laterality: Right;  with Mesh  . left knee meniscus repair  1992  . right rotator cuff  1992  . right shoulder arthroscopy  2011       Home Medications    Prior to Admission medications   Medication Sig Start Date End Date Taking? Authorizing Provider  albuterol (VENTOLIN HFA) 108 (90 Base) MCG/ACT inhaler INHALE 2 PUFFS INTO THE LUNGS EVERY 6 (SIX) HOURS AS NEEDED FOR SHORTNESS OF BREATH. 04/06/19   Janith Lima, MD  aspirin EC 81 MG EC tablet Take 1 tablet (81 mg total) by mouth daily. 07/09/19   Black, Lezlie Octave, NP  cholecalciferol (VITAMIN D3) 25 MCG (1000 UT) tablet Take 1,000 Units by mouth daily.    [provider]  Coenzyme Q10-Vitamin E 100-300 MG-UNIT CHEW Chew by mouth.    [provider]  diphenoxylate-atropine (LOMOTIL) 2.5-0.025 MG tablet Take 1 tablet by mouth 4 (four) times daily as needed for diarrhea or loose stools. 03/09/20   Dimas Chyle  M, MD  dutasteride (AVODART) 0.5 MG capsule Take 0.5 mg by mouth daily.    [provider]  famotidine (HM FAMOTIDINE) 20 MG tablet Take 20 mg by mouth every evening.     [provider]  glucosamine-chondroitin 500-400 MG tablet Take 3 tablets by mouth daily. Patient taking differently: Take 2 tablets by mouth daily.  02/15/18   Marrian Salvage, FNP  losartan (COZAAR) 100 MG tablet TAKE 1 TABLET BY MOUTH EVERY DAY 06/21/20   [name], MD  Multiple Vitamins-Minerals (CENTRUM SILVER PO) Take by mouth.    [provider]  mupirocin cream (BACTROBAN) 2 % Apply 1 application topically 2 (two) times daily. 04/15/20   [name], MD  Omega-3 Fatty Acids (FISH OIL PO) Take  1 tablet by mouth daily.    [provider]  rosuvastatin (CRESTOR) 5 MG tablet Take 1 tablet (5 mg total) by mouth daily. 04/12/20   [name], MD  tamsulosin (FLOMAX) 0.4 MG CAPS capsule Take 0.4 mg by mouth daily.     [provider]  Donnal Debar 100-62.5-25 MCG/INH AEPB TAKE 1 PUFF BY MOUTH EVERY DAY 06/27/20   Collene Gobble, MD  TURMERIC CURCUMIN PO Take by mouth.    [provider]  olmesartan (BENICAR) 40 MG tablet TAKE 1 TABLET BY MOUTH EVERY DAY 11/14/19 02/04/20  [name], MD    Family History Family History  Problem Relation Age of Onset  . Cancer Sister        breast  . Leukemia Mother   . Cancer Father     Social History Social History   Tobacco Use  . Smoking status: Former Smoker    Years: 43.00    Types: Cigarettes    Quit date: 07/21/2002    Years since quitting: 17.9  . Smokeless tobacco: Never Used  Vaping Use  . Vaping Use: Never used  Substance Use Topics  . Alcohol use: No  . Drug use: No    Comment: 26 years ago cocaine, heroin. methadone     Allergies   Patient has no known allergies.   Review of Systems Review of Systems PER HPI    Physical Exam Triage Vital Signs ED Triage Vitals  Enc Vitals Group     BP 07/12/20 1419 139/74     Pulse Rate 07/12/20 1419 98     Resp 07/12/20 1419 16     Temp 07/12/20 1419 98.6 F (37 C)     Temp Source 07/12/20 1419 Oral     SpO2 07/12/20 1419 98 %     Weight --      Height --      Head Circumference --      Peak Flow --      Pain Score 07/12/20 1418 1     Pain Loc --      Pain Edu? --      Excl. in [place]? --    No data found.  Updated Vital Signs BP 139/74 (BP Location: Right Arm)   Pulse 98   Temp 98.6 F (37 C) (Oral)   Resp 16   SpO2 98%   Visual Acuity Right Eye Distance:   Left Eye Distance:   Bilateral Distance:    Right Eye Near:   Left Eye Near:    Bilateral Near:     Physical Exam Vitals and nursing note reviewed.   Constitutional:      Appearance: Normal appearance.  HENT:  Head: Atraumatic.     Right Ear: Tympanic membrane normal.     Left Ear: Tympanic membrane normal.     Nose: Rhinorrhea present.     Mouth/Throat:     Mouth: Mucous membranes are moist.     Pharynx: Oropharynx is clear.  Eyes:     Extraocular Movements: Extraocular movements intact.     Conjunctiva/sclera: Conjunctivae normal.  Cardiovascular:     Rate and Rhythm: Normal rate and regular rhythm.  Pulmonary:     Effort: Pulmonary effort is normal.     Breath sounds: Wheezing (diffuse, mildly worse right base) present. No rales.     Comments: Breath sounds decreased b/l  Abdominal:     General: Bowel sounds are normal.     Palpations: Abdomen is soft.  Musculoskeletal:        General: Normal range of motion.     Cervical back: Normal range of motion and neck supple.  Skin:    General: Skin is warm and dry.  Neurological:     General: No focal deficit present.     Mental Status: He is oriented to person, place, and time.  Psychiatric:        Mood and Affect: Mood normal.        Thought Content: Thought content normal.        Judgment: Judgment normal.      UC Treatments / Results  Labs (all labs ordered are listed, but only abnormal results are displayed) Labs Reviewed  SARS CORONAVIRUS 2 (TAT 6-24 HRS)    EKG   Radiology CT ANGIO CHEST AORTA W/CM & OR WO/CM  Result Date: 07/11/2020 CLINICAL DATA:  Follow-up thoracic aortic aneurysm EXAM: CT ANGIOGRAPHY CHEST WITH CONTRAST TECHNIQUE: Multidetector CT imaging of the chest was performed using the standard protocol during bolus administration of intravenous contrast. Multiplanar CT image reconstructions and MIPs were obtained to evaluate the vascular anatomy. CONTRAST:  69mL ISOVUE-370 IOPAMIDOL (ISOVUE-370) INJECTION 76% Creatinine was obtained on site at [facility] at [address] Results: Creatinine 0.8 mg/dL. COMPARISON:  07/11/2019  FINDINGS: Cardiovascular: Thoracic aorta demonstrates atherosclerotic calcifications without evidence of dissection. The aortic root at the level of the sinus of Valsalva is measures 4.3 cm relatively stable from the prior exam. The ascending aorta measures up to 3.7 cm similar to that noted on prior exam. Pulmonary artery as visualized is within normal limits. Cardiac size is stable. Coronary calcifications are noted. Mediastinum/Nodes: Thoracic inlet is within normal limits. No hilar or mediastinal adenopathy is noted. The esophagus is within normal limits. Lungs/Pleura: Lungs are well aerated bilaterally with mild emphysematous changes. Apical pleural and parenchymal scarring is seen. 10 mm nodule is noted in the left lower lobe best seen on image number 118 of series 6 new from the prior exam. Some scattered scarring is noted in the bases stable from the previous study. A 9 mm nodule is noted in the right upper lobe on image number 33 of series 6. This is also new from the prior study. Upper Abdomen: Fatty infiltration of the liver is noted. No other focal abnormality in the upper abdomen is seen. Musculoskeletal: No chest wall abnormality. No acute or significant osseous findings. Postsurgical changes are noted in the cervical spine. Review of the MIP images confirms the above findings. IMPRESSION: Stable dilatation of the aortic root to 4.4 cm at the level of the sinus of Valsalva. Ascending aorta measures 3.7 cm. Recommend annual imaging followup by CTA or MRA. This  recommendation follows 2010 ACCF/AHA/AATS/ACR/ASA/SCA/SCAI/SIR/STS/SVM Guidelines for the Diagnosis and Management of Patients with Thoracic Aortic Disease. Circulation. 2010; [id number]:[id number]. Aortic aneurysm NOS (ICD10-I71.9) No evidence of dissection. New nodular changes in the right upper lobe and left lower lobe as described. Non-contrast chest CT at 3-6 months is recommended. If the nodules are stable at time of repeat CT, then future CT at  18-24 months (from today's scan) is considered optional for low-risk patients, but is recommended for high-risk patients. This recommendation follows the consensus statement: Guidelines for Management of Incidental Pulmonary Nodules Detected on CT Images: From the Fleischner Society 2017; Radiology 2017; 284:228-243. Aortic Atherosclerosis (ICD10-I70.0) and Emphysema (ICD10-J43.9). Electronically Signed   By: Inez Catalina M.D.   On: 07/11/2020 12:33    Procedures Procedures (including critical care time)  Medications Ordered in UC Medications - No data to display  Initial Impression / Assessment and Plan / UC Course  I have reviewed the triage vital signs and the nursing notes.  Pertinent labs & imaging results that were available during my care of the patient were reviewed by me and considered in my medical decision making (see chart for details).     Very well appearing, vitals normal and reassuring, speaking in full sentences on room air comfortably and ambulating without effort. Discussed COVID testing which is pending, isolation, continued inhaler use, mucinex, supportive care. Strict ED precautions reviewed if worsening.   Final Clinical Impressions(s) / UC Diagnoses   Final diagnoses:  Wheezing  Other emphysema ([place])   Discharge Instructions   None    ED Prescriptions    None     PDMP not reviewed this encounter.   Volney American, [place] 07/12/20 [phone]

## 2020-07-16 NOTE — Telephone Encounter (Signed)
Patient had a CT angio on 07/11/20 that was ordered by Dr. Laneta Simmers. He is concerned about the lungs portion. Below are the results:   FINDINGS: Cardiovascular: Thoracic aorta demonstrates atherosclerotic calcifications without evidence of dissection. The aortic root at the level of the sinus of Valsalva is measures 4.3 cm relatively stable from the prior exam. The ascending aorta measures up to 3.7 cm similar to that noted on prior exam. Pulmonary artery as visualized is within normal limits. Cardiac size is stable. Coronary calcifications are noted.  Mediastinum/Nodes: Thoracic inlet is within normal limits. No hilar or mediastinal adenopathy is noted. The esophagus is within normal limits.  Lungs/Pleura: Lungs are well aerated bilaterally with mild emphysematous changes. Apical pleural and parenchymal scarring is seen. 10 mm nodule is noted in the left lower lobe best seen on image number 118 of series 6 new from the prior exam. Some scattered scarring is noted in the bases stable from the previous study. A 9 mm nodule is noted in the right upper lobe on image number 33 of series 6. This is also new from the prior study.  Upper Abdomen: Fatty infiltration of the liver is noted. No other focal abnormality in the upper abdomen is seen.  Musculoskeletal: No chest wall abnormality. No acute or significant osseous findings. Postsurgical changes are noted in the cervical spine.  Review of the MIP images confirms the above findings.  IMPRESSION: Stable dilatation of the aortic root to 4.4 cm at the level of the sinus of Valsalva. Ascending aorta measures 3.7 cm. Recommend annual imaging followup by CTA or MRA. This recommendation follows 2010 ACCF/AHA/AATS/ACR/ASA/SCA/SCAI/SIR/STS/SVM Guidelines for the Diagnosis and Management of Patients with Thoracic Aortic Disease. Circulation. 2010; 121: O032-Z224. Aortic aneurysm NOS (ICD10-I71.9)  No evidence of dissection.  New  nodular changes in the right upper lobe and left lower lobe as described. Non-contrast chest CT at 3-6 months is recommended. If the nodules are stable at time of repeat CT, then future CT at 18-24 months (from today's scan) is considered optional for low-risk patients, but is recommended for high-risk patients. This recommendation follows the consensus statement: Guidelines for Management of Incidental Pulmonary Nodules Detected on CT Images: From the Fleischner Society 2017; Radiology 2017; 284:228-243.  Aortic Atherosclerosis (ICD10-I70.0) and Emphysema (ICD10-J43.9).   Electronically Signed   By: Alcide Clever M.D.   On: 07/11/2020 12:33   TP, can you please advise? Thanks!

## 2020-07-17 NOTE — Telephone Encounter (Signed)
Spoke with patient reviewed scan   Rec: CT chest w/ no contrast in 3 months to follow lung nodules max 10 mm .   Needs ov in 3 months with Krystopher Kuenzel NP or [place] MD to discuss results and followup ov

## 2020-07-18 NOTE — Telephone Encounter (Signed)
Called and spoke with pt letting him know that order has been placed for repeat CT in 3 months and also have scheduled pt a f/u appt with TP to discuss results of CT after. Nothing further needed.

## 2020-07-21 DIAGNOSIS — Z860100 Personal history of colon polyps, unspecified: Secondary | ICD-10-CM

## 2020-07-21 HISTORY — DX: Personal history of colon polyps, unspecified: Z86.0100

## 2020-07-21 HISTORY — DX: Personal history of colonic polyps: Z86.010

## 2020-08-01 ENCOUNTER — Telehealth (INDEPENDENT_AMBULATORY_CARE_PROVIDER_SITE_OTHER): Payer: PPO | Admitting: Surgery

## 2020-08-01 ENCOUNTER — Encounter: Payer: Self-pay | Admitting: Surgery

## 2020-08-01 ENCOUNTER — Other Ambulatory Visit: Payer: Self-pay

## 2020-08-01 DIAGNOSIS — I77810 Thoracic aortic ectasia: Secondary | ICD-10-CM

## 2020-08-01 NOTE — Progress Notes (Signed)
Patient ID: George Watson, male   DOB: 11/16/1945, 75 y.o.   MRN: [id number]      [facility] [address]       [place] [address]             [facility] TELEPHONE VIRTUAL OFFICE NOTE  Referring Provider is [name], MD Primary Cardiologist is No primary care provider on file. PCP is [name], MD   HPI:  I spoke with George Watson (DOB [birth year] ) via telephone on 08/01/2020 at 10:32 AM and verified that I was speaking with the correct person using more than one form of identification.  We discussed the fact that I was contacting them from my office and they were located at home, as well as the reason(s) for conducting our visit virtually instead of in-person.  The patient expressed understanding the circumstances and agreed to proceed as described.   The patient is a 75 year old gentleman who I initially saw on 07/13/2019 in consultation for a 4.4 cm aortic root aneurysm which was felt to be slightly larger than on the prior CT scan in January 2017 when it was measured at 4.1 cm.  He recently had a CTA of the chest on 07/11/2020 for 1 year follow-up.  This showed the aortic root to have stable diameter at about 4.4 cm at the level of the sinus of Valsalva.  The ascending aorta measured 3.7 cm in maximum diameter.  He did have a new 10 mm nodule in the left lower lobe and a 9 mm nodule in the right upper lobe, neither of which was present on the prior study.  He has history of COPD and is followed by Dr. Lamonte Sakai.  He has been feeling well overall without chest or back pain.  He has had no change in his breathing.   Current Outpatient Medications  Medication Sig Dispense Refill  . albuterol (VENTOLIN HFA) 108 (90 Base) MCG/ACT inhaler INHALE 2 PUFFS INTO THE LUNGS EVERY 6 (SIX) HOURS AS NEEDED FOR SHORTNESS OF BREATH. 18 g 1  . aspirin EC 81 MG EC tablet Take 1 tablet (81 mg total) by mouth daily.    . cholecalciferol (VITAMIN D3) 25 MCG (1000  UT) tablet Take 1,000 Units by mouth daily.    . Coenzyme Q10-Vitamin E 100-300 MG-UNIT CHEW Chew by mouth.    . diphenoxylate-atropine (LOMOTIL) 2.5-0.025 MG tablet Take 1 tablet by mouth 4 (four) times daily as needed for diarrhea or loose stools. 30 tablet 0  . dutasteride (AVODART) 0.5 MG capsule Take 0.5 mg by mouth daily.    . famotidine (HM FAMOTIDINE) 20 MG tablet Take 20 mg by mouth every evening.     Marland Kitchen glucosamine-chondroitin 500-400 MG tablet Take 3 tablets by mouth daily. (Patient taking differently: Take 2 tablets by mouth daily. )    . losartan (COZAAR) 100 MG tablet TAKE 1 TABLET BY MOUTH EVERY DAY 90 tablet 1  . Multiple Vitamins-Minerals (CENTRUM SILVER PO) Take by mouth.    . mupirocin cream (BACTROBAN) 2 % Apply 1 application topically 2 (two) times daily. 30 g 1  . Omega-3 Fatty Acids (FISH OIL PO) Take 1 tablet by mouth daily.    . rosuvastatin (CRESTOR) 5 MG tablet Take 1 tablet (5 mg total) by mouth daily. 90 tablet 3  . tamsulosin (FLOMAX) 0.4 MG CAPS capsule Take 0.4 mg by mouth daily.     . TRELEGY ELLIPTA 100-62.5-25 MCG/INH AEPB TAKE 1  PUFF BY MOUTH EVERY DAY 60 each 5  . TURMERIC CURCUMIN PO Take by mouth.     No current facility-administered medications for this visit.     Diagnostic Tests:  Narrative & Impression  CLINICAL DATA:  Follow-up thoracic aortic aneurysm  EXAM: CT ANGIOGRAPHY CHEST WITH CONTRAST  TECHNIQUE: Multidetector CT imaging of the chest was performed using the standard protocol during bolus administration of intravenous contrast. Multiplanar CT image reconstructions and MIPs were obtained to evaluate the vascular anatomy.  CONTRAST:  81mL ISOVUE-370 IOPAMIDOL (ISOVUE-370) INJECTION 76%  Creatinine was obtained on site at [facility] at [address]  Results: Creatinine 0.8 mg/dL.  COMPARISON:  07/11/2019  FINDINGS: Cardiovascular: Thoracic aorta demonstrates atherosclerotic calcifications without  evidence of dissection. The aortic root at the level of the sinus of Valsalva is measures 4.3 cm relatively stable from the prior exam. The ascending aorta measures up to 3.7 cm similar to that noted on prior exam. Pulmonary artery as visualized is within normal limits. Cardiac size is stable. Coronary calcifications are noted.  Mediastinum/Nodes: Thoracic inlet is within normal limits. No hilar or mediastinal adenopathy is noted. The esophagus is within normal limits.  Lungs/Pleura: Lungs are well aerated bilaterally with mild emphysematous changes. Apical pleural and parenchymal scarring is seen. 10 mm nodule is noted in the left lower lobe best seen on image number 118 of series 6 new from the prior exam. Some scattered scarring is noted in the bases stable from the previous study. A 9 mm nodule is noted in the right upper lobe on image number 33 of series 6. This is also new from the prior study.  Upper Abdomen: Fatty infiltration of the liver is noted. No other focal abnormality in the upper abdomen is seen.  Musculoskeletal: No chest wall abnormality. No acute or significant osseous findings. Postsurgical changes are noted in the cervical spine.  Review of the MIP images confirms the above findings.  IMPRESSION: Stable dilatation of the aortic root to 4.4 cm at the level of the sinus of Valsalva. Ascending aorta measures 3.7 cm. Recommend annual imaging followup by CTA or MRA. This recommendation follows 2010 ACCF/AHA/AATS/ACR/ASA/SCA/SCAI/SIR/STS/SVM Guidelines for the Diagnosis and Management of Patients with Thoracic Aortic Disease. Circulation. 2010; 121: E703-J009. Aortic aneurysm NOS (ICD10-I71.9)  No evidence of dissection.  New nodular changes in the right upper lobe and left lower lobe as described. Non-contrast chest CT at 3-6 months is recommended. If the nodules are stable at time of repeat CT, then future CT at 18-24 months (from today's scan) is  considered optional for low-risk patients, but is recommended for high-risk patients. This recommendation follows the consensus statement: Guidelines for Management of Incidental Pulmonary Nodules Detected on CT Images: From the Fleischner Society 2017; Radiology 2017; 284:228-243.  Aortic Atherosclerosis (ICD10-I70.0) and Emphysema (ICD10-J43.9).   Electronically Signed   By: Inez Catalina M.D.   On: 07/11/2020 12:33       Impression:  He has a stable 4.4 cm aortic root aneurysm which is well below the surgical threshold of 5.5 cm.  Prior echocardiogram showed a normal trileaflet aortic valve without stenosis or insufficiency.  His CT scan does show new nodules in the right upper lobe and left lower lobe.  The patient said that he was already aware of these and discussed them with his pulmonologist.  He is scheduled for a follow-up CT scan of the chest in 3 months per pulmonary medicine.  I reviewed the CT results with him and  answered all of his questions.  I stressed the importance of continued good blood pressure control and preventing further enlargement of his aorta and acute aortic dissection.  Plan:  He will return to see me in 1 year with a CTA of the chest.    I discussed limitations of evaluation and management via telephone.  The patient was advised to call back for repeat telephone consultation or to seek an in-person evaluation if questions arise or the patient's clinical condition changes in any significant manner.  I spent 5 minutes of non-face-to-face time during the conduct of this telephone virtual office consultation, including pre-visit review of the patient's records and direct conversation with the patient.     Gaye Pollack, MD 08/01/2020 10:32 AM

## 2020-09-11 ENCOUNTER — Encounter: Payer: Self-pay | Admitting: Adult Health

## 2020-09-11 DIAGNOSIS — K529 Noninfective gastroenteritis and colitis, unspecified: Secondary | ICD-10-CM | POA: Diagnosis not present

## 2020-09-11 DIAGNOSIS — K621 Rectal polyp: Secondary | ICD-10-CM | POA: Diagnosis not present

## 2020-09-11 DIAGNOSIS — Z86010 Personal history of colonic polyps: Secondary | ICD-10-CM | POA: Diagnosis not present

## 2020-09-11 DIAGNOSIS — K648 Other hemorrhoids: Secondary | ICD-10-CM | POA: Diagnosis not present

## 2020-09-11 DIAGNOSIS — K5730 Diverticulosis of large intestine without perforation or abscess without bleeding: Secondary | ICD-10-CM | POA: Diagnosis not present

## 2020-09-11 DIAGNOSIS — K6389 Other specified diseases of intestine: Secondary | ICD-10-CM | POA: Diagnosis not present

## 2020-09-11 DIAGNOSIS — K635 Polyp of colon: Secondary | ICD-10-CM | POA: Diagnosis not present

## 2020-09-11 DIAGNOSIS — Z1211 Encounter for screening for malignant neoplasm of colon: Secondary | ICD-10-CM | POA: Diagnosis not present

## 2020-09-11 DIAGNOSIS — A09 Infectious gastroenteritis and colitis, unspecified: Secondary | ICD-10-CM | POA: Diagnosis not present

## 2020-09-20 ENCOUNTER — Encounter: Payer: Self-pay | Admitting: Family Medicine

## 2020-10-03 ENCOUNTER — Other Ambulatory Visit: Payer: Self-pay

## 2020-10-03 ENCOUNTER — Ambulatory Visit
Admission: RE | Admit: 2020-10-03 | Discharge: 2020-10-03 | Disposition: A | Discharge status: Home / Self Care | Payer: PPO | Source: Ambulatory Visit | Attending: Adult Health | Admitting: Adult Health

## 2020-10-03 DIAGNOSIS — R918 Other nonspecific abnormal finding of lung field: Secondary | ICD-10-CM | POA: Diagnosis not present

## 2020-10-04 ENCOUNTER — Encounter: Payer: Self-pay | Admitting: Adult Health

## 2020-10-04 ENCOUNTER — Ambulatory Visit: Payer: PPO | Admitting: Adult Health

## 2020-10-04 DIAGNOSIS — R911 Solitary pulmonary nodule: Secondary | ICD-10-CM | POA: Diagnosis not present

## 2020-10-04 DIAGNOSIS — J449 Chronic obstructive pulmonary disease, unspecified: Secondary | ICD-10-CM | POA: Diagnosis not present

## 2020-10-04 NOTE — Patient Instructions (Signed)
Continue on TRELEGY 1 puff daily , rinse after use.  Activity as tolerated.  Follow up in 6 months with Dr. Lamonte Sakai  and As needed

## 2020-10-04 NOTE — Progress Notes (Signed)
@Patient  ID: George Watson, male    DOB: [birth year], 75 y.o.   MRN: [id number]  Chief Complaint  Patient presents with  . Follow-up    Referring provider: [name], MD  HPI: 75 year old male former smoker seen for pulmonary consult December 28, 2019 for COPD.  TEST/EVENTS :  July 2021 PFT showed FEV1 78%, ratio 77, FVC 75%.  No significant bronchodilator response.  Mid flow reversibility.  (Patient did take Trelegy inhaler prior to PFT) 2016 FEV1 53%. CT chest July 10, 2020 showed biapical pleural and parenchymal scarring and bullous changes. CT angio chest aorta December 2021 new nodular changes in the right upper lobe and left lower lobe  10/04/2020 Follow up ; COPD and abnormal CT chest Patient returns for a follow-up visit.  Last seen February 13, 2020.  Patient says overall breathing has been doing well.  He denies any flare of cough or wheezing.  Remains on Trelegy inhaler daily.  No change in activity tolerance.  Patient says he remains very active. Patient previously been on Anoro last year since starting Trelegy has had improvement.  He is doing very well.  We talked about de-escalating however has had no flare in breathing over the last several months.  Patient did have a CT angio chest to follow a thoracic aortic aneurysm.  On the CT showed nodular changes in the right upper lobe and left lower lobe.  There was recommend to have a follow-up CT chest which was completed October 03, 2020 This showed no lymphadenopathy.  Stable mild emphysematous changes and stable biapical pleural and parenchymal scarring.  The left lower lobe inflammatory/infectious process has largely resolved.  There is minimal residual post scarring/atelectasis.  No worrisome lesions noted. We reviewed his CT results.     No Known Allergies  Immunization History  Administered Date(s) Administered  . Fluad Quad(high Dose 65+) 03/29/2019  . Hepatitis A 02/18/1998, 08/21/1998  . Influenza Split  04/01/2011, 05/18/2012  . Influenza, High Dose Seasonal PF 04/23/2016, 03/17/2018  . Influenza, Seasonal, Injecte, Preservative Fre 05/01/2017  . Influenza,inj,Quad PF,6+ Mos 04/28/2013, 04/21/2014  . Influenza-Unspecified 04/21/2015, 05/21/2020  . PFIZER(Purple Top)SARS-COV-2 Vaccination 08/10/2019, 08/31/2019, 05/21/2020  . Pneumococcal Conjugate-13 08/22/2014  . Pneumococcal Polysaccharide-23 07/21/2002, 07/08/2016  . Tdap 11/18/2005, 01/03/2015    Past Medical History:  Diagnosis Date  . BPH (benign prostatic hypertrophy)   . COPD (chronic obstructive pulmonary disease) ([place])   . DDD (degenerative disc disease), lumbosacral   . Elevated PSA   . Essential hypertension, benign    diet controlled  . Fracture of shaft of clavicle   . GERD (gastroesophageal reflux disease)   . H/O drug abuse ([place])    multisubstance  . Hepatitis C 10/1996  . HSV-2 (herpes simplex virus 2) infection   . Hx of biopsy 08/2004   liver  . Inguinal hernia    right  . Other and unspecified hyperlipidemia   . Prostate cancer ([place])   . Substance abuse ([place])   . Tuberculosis     Tobacco History: Social History   Tobacco Use  Smoking Status Former Smoker  . Years: 43.00  . Types: Cigarettes  . Quit date: 07/21/2002  . Years since quitting: 18.2  Smokeless Tobacco Never Used   Counseling given: Not Answered   Outpatient Medications Prior to Visit  Medication Sig Dispense Refill  . albuterol (VENTOLIN HFA) 108 (90 Base) MCG/ACT inhaler INHALE 2 PUFFS INTO THE LUNGS EVERY 6 (SIX) HOURS AS NEEDED FOR SHORTNESS OF BREATH.  18 g 1  . aspirin EC 81 MG EC tablet Take 1 tablet (81 mg total) by mouth daily.    . cholecalciferol (VITAMIN D3) 25 MCG (1000 UT) tablet Take 1,000 Units by mouth daily.    . Coenzyme Q10-Vitamin E 100-300 MG-UNIT CHEW Chew by mouth.    . diphenoxylate-atropine (LOMOTIL) 2.5-0.025 MG tablet Take 1 tablet by mouth 4 (four) times daily as needed for diarrhea or loose stools. 30  tablet 0  . dutasteride (AVODART) 0.5 MG capsule Take 0.5 mg by mouth daily.    . famotidine (PEPCID) 20 MG tablet Take 20 mg by mouth every evening.     Marland Kitchen glucosamine-chondroitin 500-400 MG tablet Take 3 tablets by mouth daily. (Patient taking differently: Take 2 tablets by mouth daily.)    . losartan (COZAAR) 100 MG tablet TAKE 1 TABLET BY MOUTH EVERY DAY 90 tablet 1  . Multiple Vitamins-Minerals (CENTRUM SILVER PO) Take by mouth.    . mupirocin cream (BACTROBAN) 2 % Apply 1 application topically 2 (two) times daily. 30 g 1  . Omega-3 Fatty Acids (FISH OIL PO) Take 1 tablet by mouth daily.    . rosuvastatin (CRESTOR) 5 MG tablet Take 1 tablet (5 mg total) by mouth daily. 90 tablet 3  . tamsulosin (FLOMAX) 0.4 MG CAPS capsule Take 0.4 mg by mouth daily.     . TRELEGY ELLIPTA 100-62.5-25 MCG/INH AEPB TAKE 1 PUFF BY MOUTH EVERY DAY 60 each 5  . TURMERIC CURCUMIN PO Take by mouth.     No facility-administered medications prior to visit.     Review of Systems:   Constitutional:   No  weight loss, night sweats,  Fevers, chills, fatigue, or  lassitude.  HEENT:   No headaches,  Difficulty swallowing,  Tooth/dental problems, or  Sore throat,                No sneezing, itching, ear ache, nasal congestion, post nasal drip,   CV:  No chest pain,  Orthopnea, PND, swelling in lower extremities, anasarca, dizziness, palpitations, syncope.   GI  No heartburn, indigestion, abdominal pain, nausea, vomiting, diarrhea, change in bowel habits, loss of appetite, bloody stools.   Resp: No shortness of breath with exertion or at rest.  No excess mucus, no productive cough,  No non-productive cough,  No coughing up of blood.  No change in color of mucus.  No wheezing.  No chest wall deformity  Skin: no rash or lesions.  GU: no dysuria, change in color of urine, no urgency or frequency.  No flank pain, no hematuria   MS:  No joint pain or swelling.  No decreased range of motion.  No back  pain.    Physical Exam  BP (!) 146/84 (BP Location: Left Arm, Cuff Size: Normal)   Pulse 66   Temp 97.9 F (36.6 C)   Ht 5\' 11"  (1.803 m)   Wt 168 lb (76.2 kg)   SpO2 97%   BMI 23.43 kg/m   GEN: A/Ox3; pleasant , NAD, well nourished    HEENT:  [place]/AT,   NOSE-clear, THROAT-clear, no lesions, no postnasal drip or exudate noted.   NECK:  Supple w/ fair ROM; no JVD; normal carotid impulses w/o bruits; no thyromegaly or nodules palpated; no lymphadenopathy.    RESP  Clear  P & A; w/o, wheezes/ rales/ or rhonchi. no accessory muscle use, no dullness to percussion  CARD:  RRR, no m/r/g, no peripheral edema, pulses intact, no cyanosis or clubbing.  GI:   Soft & nt; nml bowel sounds; no organomegaly or masses detected.   Musco: Warm bil, no deformities or joint swelling noted.   Neuro: alert, no focal deficits noted.    Skin: Warm, no lesions or rashes     BNP No results found for: BNP  ProBNP No results found for: PROBNP  Imaging: CT Chest Wo Contrast  Result Date: 10/03/2020 CLINICAL DATA:  Follow-up right upper lobe and left lower lobe pulmonary nodules. EXAM: CT CHEST WITHOUT CONTRAST TECHNIQUE: Multidetector CT imaging of the chest was performed following the standard protocol without IV contrast. COMPARISON:  Chest CT 07/11/2020 FINDINGS: Cardiovascular: The heart is normal in size. No pericardial effusion. Stable tortuosity and ectasia of the thoracic aorta. Stable three-vessel coronary artery calcifications. Mediastinum/Nodes: No mediastinal or hilar mass or adenopathy. Small scattered lymph nodes are stable. Lungs/Pleura: Stable mild emphysematous changes. Stable biapical pleural and parenchymal scarring type changes. The left lower lobe inflammatory/infectious process has largely resolved. There is minimal residual post pneumonic scarring or atelectasis. No worrisome pulmonary lesions. No pleural effusions or new infiltrates. Upper Abdomen: No significant upper abdominal  findings. Musculoskeletal: No chest wall mass, supraclavicular or axillary adenopathy. The bony thorax is intact. IMPRESSION: 1. Near complete resolution of the left lower lobe inflammatory/infectious process. There is minimal residual post pneumonic scarring or atelectasis. 2. Stable biapical pleural and parenchymal scarring changes. 3. No worrisome pulmonary lesions. 4. Stable mild emphysematous changes. 5. No mediastinal or hilar mass or adenopathy. 6. Stable three-vessel coronary artery calcifications. 7. Emphysema and aortic atherosclerosis. Aortic Atherosclerosis (ICD10-I70.0) and Emphysema (ICD10-J43.9). Electronically Signed   By: Marijo Sanes M.D.   On: 10/03/2020 13:50      PFT Results Latest Ref Rng & Units 02/13/2020  FVC-Pre L 3.44  FVC-Predicted Pre % 73  FVC-Post L 3.54  FVC-Predicted Post % 75  Pre FEV1/FVC % % 76  Post FEV1/FCV % % 77  FEV1-Pre L 2.63  FEV1-Predicted Pre % 76  FEV1-Post L 2.72  DLCO uncorrected ml/min/mmHg 25.56  DLCO UNC% % 93  DLCO corrected ml/min/mmHg 25.56  DLCO COR %Predicted % 93  DLVA Predicted % 109  TLC L 6.11  TLC % Predicted % 82  RV % Predicted % 99    No results found for: NITRICOXIDE      Assessment & Plan:   No problem-specific Assessment & Plan notes found for this encounter.     Rexene Edison, NP 10/04/2020

## 2020-10-05 DIAGNOSIS — R911 Solitary pulmonary nodule: Secondary | ICD-10-CM | POA: Insufficient documentation

## 2020-10-05 NOTE — Assessment & Plan Note (Signed)
Lung nodules noted on CT chest December 2021.  Follow-up CT scan shows resolution with some post infectious/inflammatory scarring.  No further imaging is indicated at this time.

## 2020-10-05 NOTE — Assessment & Plan Note (Signed)
Well compensated on present regimen  Plan  Patient Instructions  Continue on TRELEGY 1 puff daily , rinse after use.  Activity as tolerated.  Follow up in 6 months with Dr. Lamonte Sakai  and As needed

## 2020-10-10 ENCOUNTER — Other Ambulatory Visit: Payer: Self-pay

## 2020-10-10 ENCOUNTER — Encounter: Payer: Self-pay | Admitting: Family Medicine

## 2020-10-10 ENCOUNTER — Ambulatory Visit (INDEPENDENT_AMBULATORY_CARE_PROVIDER_SITE_OTHER): Payer: PPO | Admitting: Family Medicine

## 2020-10-10 VITALS — BP 150/70 | HR 71 | Temp 98.5°F | Ht 71.0 in | Wt 168.8 lb

## 2020-10-10 DIAGNOSIS — K529 Noninfective gastroenteritis and colitis, unspecified: Secondary | ICD-10-CM | POA: Diagnosis not present

## 2020-10-10 DIAGNOSIS — I10 Essential (primary) hypertension: Secondary | ICD-10-CM | POA: Diagnosis not present

## 2020-10-10 DIAGNOSIS — B182 Chronic viral hepatitis C: Secondary | ICD-10-CM | POA: Diagnosis not present

## 2020-10-10 DIAGNOSIS — E785 Hyperlipidemia, unspecified: Secondary | ICD-10-CM

## 2020-10-10 DIAGNOSIS — R7303 Prediabetes: Secondary | ICD-10-CM | POA: Diagnosis not present

## 2020-10-10 NOTE — Patient Instructions (Signed)
-  email me in 2 weeks with BP reading. You may want to change your pills to AM. Our bp tends to be lower when we sleep anyway's.   -come back for fasting labs.   -everything else is up to date!  Dr. Rogers Blocker

## 2020-10-10 NOTE — Progress Notes (Signed)
Patient: George Watson MRN: [id number] DOB: [birth year] PCP: [name], MD     Subjective:  Chief Complaint  Patient presents with  . Hypertension  . Prediabetes  . Hyperlipidemia    HPI: The patient is a 75 y.o. male who presents today for follow up of HTN, prediabetes and hyperlipidemia. Also needs a referral for new GI as [name] is retiring.   Hypertension: Here for follow up of hypertension.  Currently on cozaar 100mg /day.  Takes medication as prescribed and denies any side effects. Exercise includes biking and body weight and swimming. Weight has been stable. Denies any chest pain, headaches, shortness of breath, vision changes, swelling in lower extremities.   Prediabetes -last a1c was 5.7. he is diet controlled. Due for lab check today.   Hyperlipidemia We stopped his zetia to see if it would help with diarrhea. It did not help. Also added on crestor at 5mg . Rechecking lipids today.   Review of Systems  Allergies Patient has No Known Allergies.  Past Medical History Patient  has a past medical history of BPH (benign prostatic hypertrophy), COPD (chronic obstructive pulmonary disease) ([place]), DDD (degenerative disc disease), lumbosacral, Elevated PSA, Essential hypertension, benign, Fracture of shaft of clavicle, GERD (gastroesophageal reflux disease), H/O drug abuse ([place]), Hepatitis C (10/1996), HSV-2 (herpes simplex virus 2) infection, biopsy (08/2004), Inguinal hernia, Other and unspecified hyperlipidemia, Prostate cancer ([place]), Substance abuse ([place]), and Tuberculosis.  Surgical History Patient  has a past surgical history that includes fracture ribs; Cervical spine surgery; right shoulder arthroscopy (2011); right rotator cuff (1992); left knee meniscus repair (1992); Inguinal hernia repair (07/01/2012); and Hernia repair.  Family History [name]'s family history includes Cancer in his father and sister; Leukemia in his mother.  Social History Patient   reports that he quit smoking about 18 years ago. His smoking use included cigarettes. He quit after 43.00 years of use. He has never used smokeless tobacco. He reports that he does not drink alcohol and does not use drugs.    Objective: Vitals:   10/10/20 1412  BP: (!) 150/70  Pulse: 71  Temp: 98.5 F (36.9 C)  TempSrc: Temporal  SpO2: 97%  Weight: 168 lb 12.8 oz (76.6 kg)  Height: 5\' 11"  (1.803 m)    Body mass index is 23.54 kg/m.  Physical Exam Vitals reviewed.  Constitutional:      Appearance: Normal appearance. He is well-developed and normal weight.  HENT:     Head: Normocephalic and atraumatic.     Right Ear: External ear normal.     Left Ear: External ear normal.  Eyes:     Conjunctiva/sclera: Conjunctivae normal.     Pupils: Pupils are equal, round, and reactive to light.  Neck:     Thyroid: No thyromegaly.     Vascular: No carotid bruit.  Cardiovascular:     Rate and Rhythm: Normal rate and regular rhythm.     Pulses: Normal pulses.     Heart sounds: Normal heart sounds. No murmur heard.   Pulmonary:     Effort: Pulmonary effort is normal.     Breath sounds: Normal breath sounds.  Abdominal:     General: Bowel sounds are normal. There is no distension.     Palpations: Abdomen is soft.     Tenderness: There is no abdominal tenderness.  Musculoskeletal:     Cervical back: Normal range of motion and neck supple.  Lymphadenopathy:     Cervical: No cervical adenopathy.  Skin:    General:  Skin is warm and dry.     Capillary Refill: Capillary refill takes less than 2 seconds.     Findings: No rash.  Neurological:     General: No focal deficit present.     Mental Status: He is alert and oriented to person, place, and time.     Cranial Nerves: No cranial nerve deficit.     Coordination: Coordination normal.     Deep Tendon Reflexes: Reflexes normal.  Psychiatric:        Mood and Affect: Mood normal.        Behavior: Behavior normal.     [place]  Office Visit from 04/12/2020 in [facility]  PHQ-2 Total Score 0         Assessment/plan: 1. Essential hypertension, benign Blood pressure above goal today. Typically well controlled. Does take medication at night. Will start taking pill in the AM and keeping a log for me. Will email me in 2 weeks with results. May need another agent. Routine labs, will come back fasting.  - CBC with Differential/Platelet - Comprehensive metabolic panel  2. Prediabetes poct a1c is 5.3. extremely well controlled.  - Hemoglobin A1c  3. Hyperlipidemia with target LDL less than 70 Tolerating crestor 5mg , check today.  - Lipid panel  4. Chronic hepatitis C without hepatic coma (HCC) Needs new GI for surveillance. Referral placed to requested provider.  - Ambulatory referral to Gastroenterology  5. Chronic diarrhea Followed by [name], will need follow up for e.coli and persistent diarrhea.  - Ambulatory referral to Gastroenterology    This visit occurred during the SARS-CoV-2 public health emergency.  Safety protocols were in place, including screening questions prior to the visit, additional usage of staff PPE, and extensive cleaning of exam room while observing appropriate contact time as indicated for disinfecting solutions.     Return in about 6 months (around 04/12/2021) for htn/diabetes .     [name], MD [facility]  10/10/2020

## 2020-10-15 ENCOUNTER — Other Ambulatory Visit: Payer: Self-pay

## 2020-10-15 ENCOUNTER — Other Ambulatory Visit (INDEPENDENT_AMBULATORY_CARE_PROVIDER_SITE_OTHER): Payer: PPO

## 2020-10-15 DIAGNOSIS — E785 Hyperlipidemia, unspecified: Secondary | ICD-10-CM

## 2020-10-15 DIAGNOSIS — I10 Essential (primary) hypertension: Secondary | ICD-10-CM | POA: Diagnosis not present

## 2020-10-15 DIAGNOSIS — R7303 Prediabetes: Secondary | ICD-10-CM

## 2020-10-15 LAB — CBC WITH DIFFERENTIAL/PLATELET
Basophils Absolute: 0 10*3/uL (ref 0.0–0.1)
Basophils Relative: 0.5 % (ref 0.0–3.0)
Eosinophils Absolute: 0.5 10*3/uL (ref 0.0–0.7)
Eosinophils Relative: 4.9 % (ref 0.0–5.0)
HCT: 46.4 % (ref 39.0–52.0)
Hemoglobin: 15.7 g/dL (ref 13.0–17.0)
Lymphocytes Relative: 26.5 % (ref 12.0–46.0)
Lymphs Abs: 2.6 10*3/uL (ref 0.7–4.0)
MCHC: 34 g/dL (ref 30.0–36.0)
MCV: 91.2 fl (ref 78.0–100.0)
Monocytes Absolute: 1.3 10*3/uL — ABNORMAL HIGH (ref 0.1–1.0)
Monocytes Relative: 13.8 % — ABNORMAL HIGH (ref 3.0–12.0)
Neutro Abs: 5.3 10*3/uL (ref 1.4–7.7)
Neutrophils Relative %: 54.3 % (ref 43.0–77.0)
Platelets: 238 10*3/uL (ref 150.0–400.0)
RBC: 5.08 Mil/uL (ref 4.22–5.81)
RDW: 13.2 % (ref 11.5–15.5)
WBC: 9.8 10*3/uL (ref 4.0–10.5)

## 2020-10-15 LAB — COMPREHENSIVE METABOLIC PANEL
ALT: 24 U/L (ref 0–53)
AST: 20 U/L (ref 0–37)
Albumin: 4.8 g/dL (ref 3.5–5.2)
Alkaline Phosphatase: 57 U/L (ref 39–117)
BUN: 21 mg/dL (ref 6–23)
CO2: 27 mEq/L (ref 19–32)
Calcium: 10.1 mg/dL (ref 8.4–10.5)
Chloride: 104 mEq/L (ref 96–112)
Creatinine, Ser: 1 mg/dL (ref 0.40–1.50)
GFR: 74.67 mL/min (ref >60.00)
Glucose, Bld: 100 mg/dL — ABNORMAL HIGH (ref 70–99)
Potassium: 4.2 mEq/L (ref 3.5–5.1)
Sodium: 139 mEq/L (ref 135–145)
Total Bilirubin: 0.5 mg/dL (ref 0.2–1.2)
Total Protein: 7.1 g/dL (ref 6.0–8.3)

## 2020-10-15 LAB — LIPID PANEL
Cholesterol: 135 mg/dL (ref 0–200)
HDL: 34.1 mg/dL — ABNORMAL LOW (ref >39.00)
LDL Cholesterol: 70 mg/dL (ref 0–99)
NonHDL: 101.17
Total CHOL/HDL Ratio: 4
Triglycerides: 154 mg/dL — ABNORMAL HIGH (ref 0.0–149.0)
VLDL: 30.8 mg/dL (ref 0.0–40.0)

## 2020-10-15 LAB — HEMOGLOBIN A1C: Hgb A1c MFr Bld: 5.8 % (ref 4.6–6.5)

## 2020-10-15 IMAGING — CT CT ANGIO CHEST
2 of 6 series · 13 of 36 positions shown · IV contrast (iopamidol)
Comparison: 08/02/2015.

CLINICAL DATA: Follow-up dilated ascending thoracic aorta.

EXAM:
CT ANGIOGRAPHY CHEST WITH CONTRAST
TECHNIQUE: Multidetector CT imaging of the chest was performed using the
standard protocol during bolus administration of intravenous
contrast. Multiplanar CT image reconstructions and MIPs were
obtained to evaluate the vascular anatomy.
CONTRAST:  100mL LJ6XSM-2LO IOPAMIDOL (LJ6XSM-2LO) INJECTION 76%

[Series 5: cta thorax 2.00 bv36 s3 axial arterial · axial · arterial · 0.61mm/px · z∈[+1316,+1580]mm · 12 of 158 slices shown]
[im 13/158  lung]
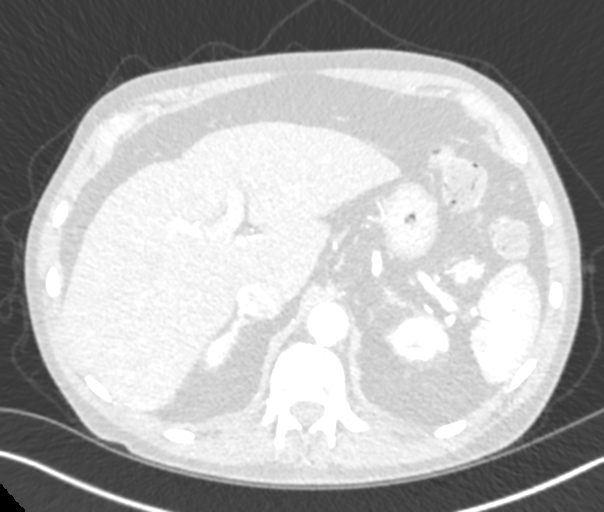
[im 25/158  mediastinal]
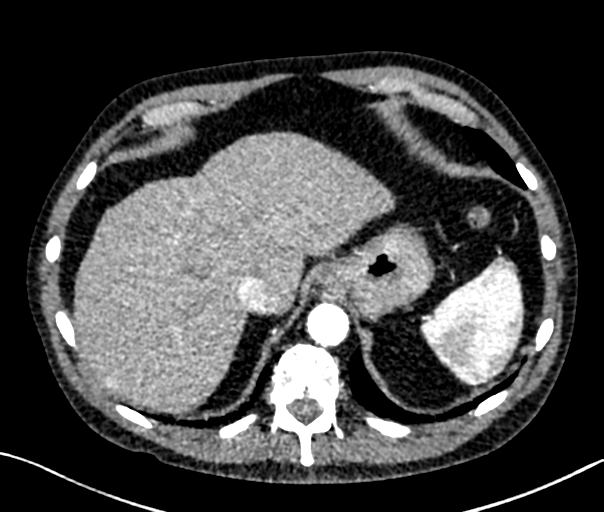
[im 37/158  lung]
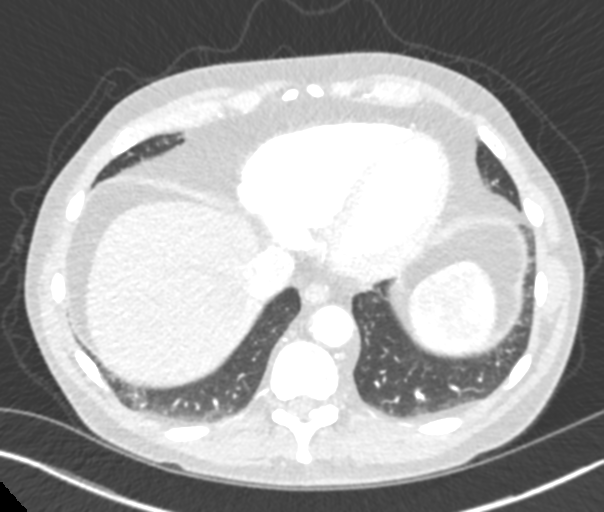
[im 49/158  mediastinal]
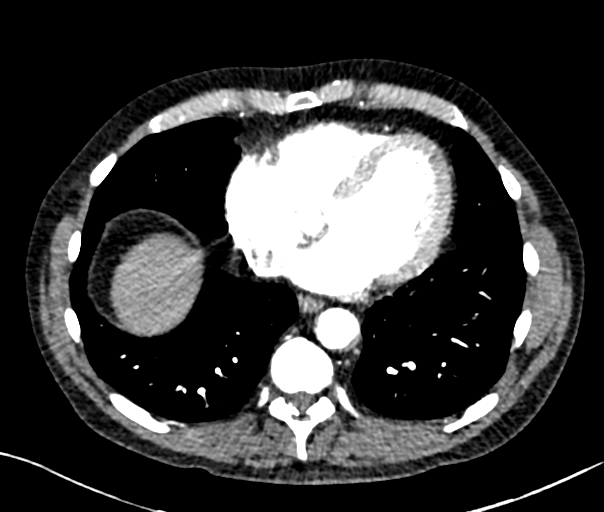
[im 61/158  lung]
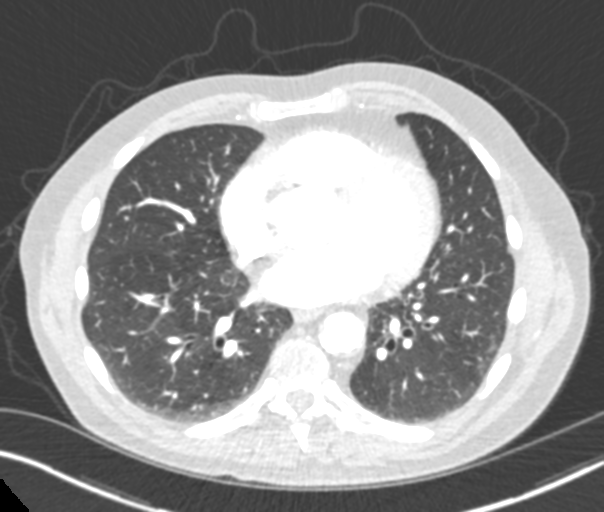
[im 73/158  mediastinal]
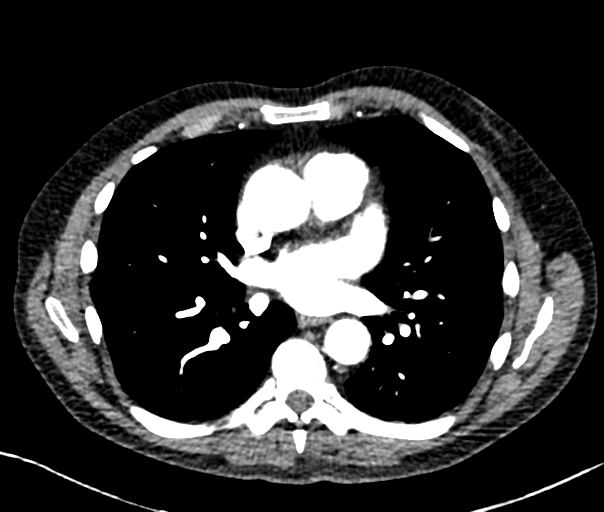
[im 85/158  lung]
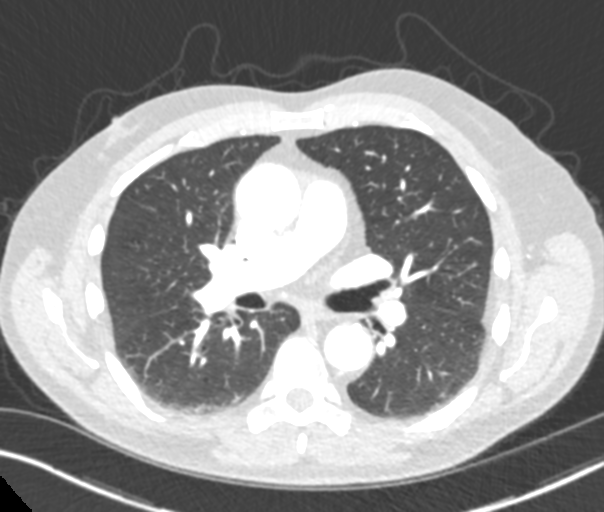
[im 97/158  mediastinal]
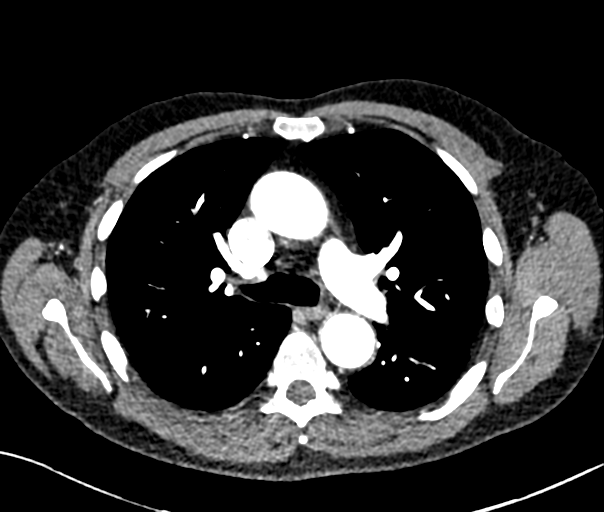
[im 109/158  lung]
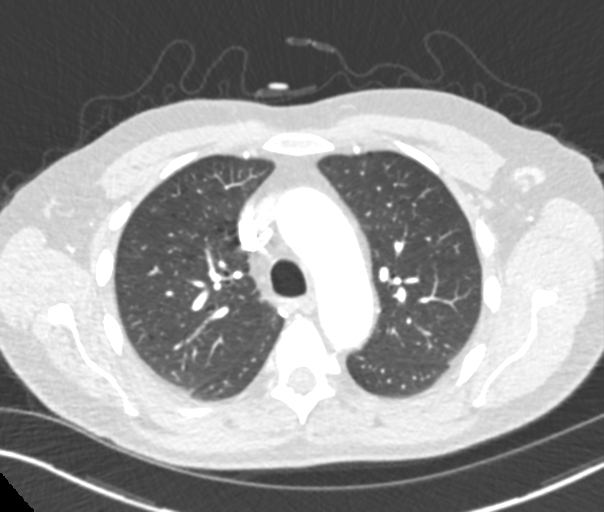
[im 121/158  mediastinal]
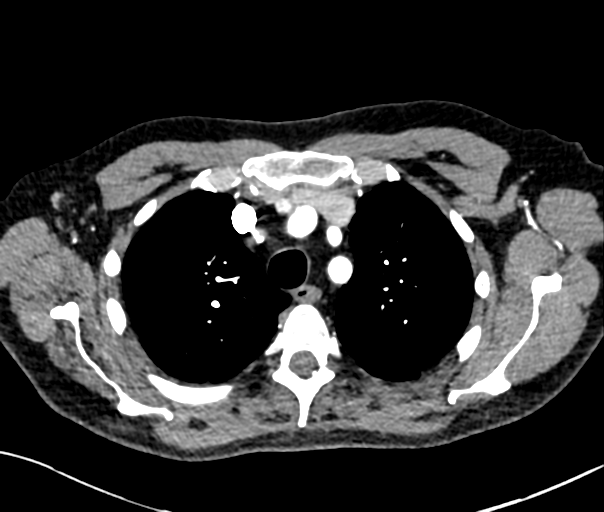
[im 133/158  lung]
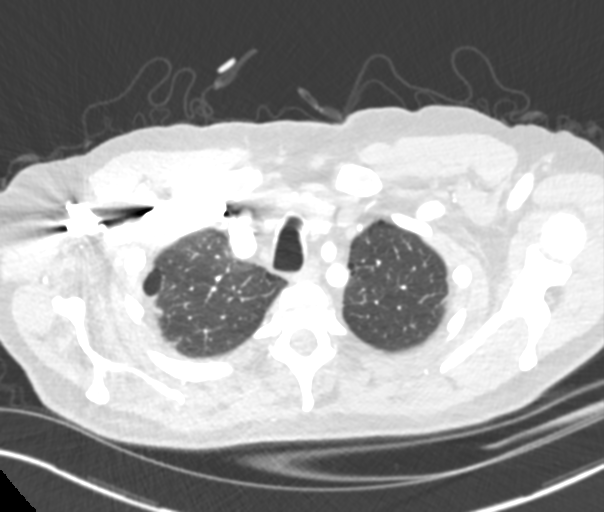
[im 145/158  mediastinal]
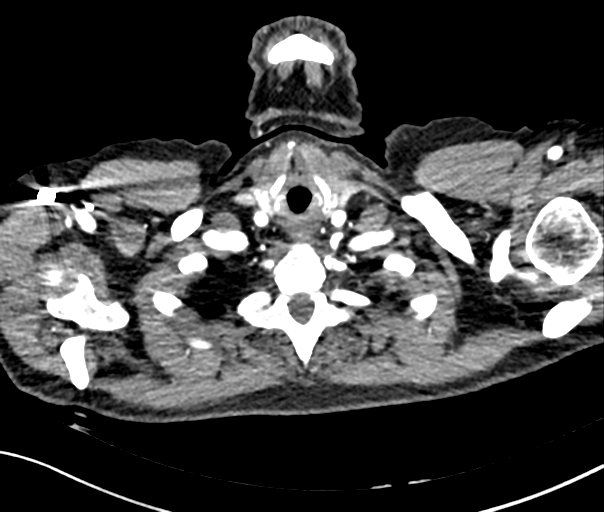

[Series 9: cta thorax 2.00 bv36 s3 cor st · coronal · 0.61mm/px · 1 of 156 slices shown]
[im 78/156  mediastinal]
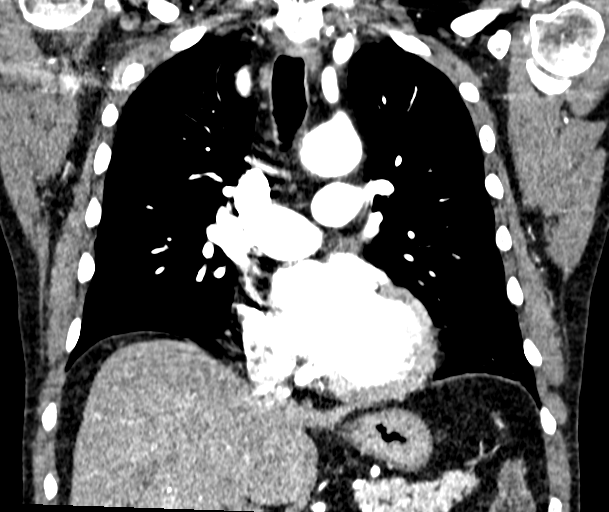

[13 of 36 positions shown; findings below may reference images not displayed]

FINDINGS: Cardiovascular: The aortic root measures 4.4 cm in maximum diameter.
The remainder of the ascending thoracic aorta measures 3.7 cm in
maximum diameter. The ascending thoracic aorta previously measured
up to 4.1 cm in maximum diameter.

The central pulmonary arteries are borderline enlarged with the main
pulmonary artery measuring 2.9 cm in maximum diameter.

Mild aortic atheromatous changes are noted.

Mediastinum/Nodes: No enlarged mediastinal, hilar, or axillary lymph
nodes. Thyroid gland, trachea, and esophagus demonstrate no
significant findings.

Lungs/Pleura: Biapical pleural and parenchymal scarring. Mild
bilateral paraseptal bullous changes.

Upper Abdomen: Unremarkable.

Musculoskeletal: Minimal thoracic spine degenerative changes.
Cervical spine fixation hardware.

Review of the MIP images confirms the above findings.
IMPRESSION: 1. Aneurysmal dilatation of the ascending thoracic aorta with an
aortic root diameter of 4.4 cm and remainder of the ascending
thoracic aorta measuring up to 3.7 cm in maximum diameter. Recommend
annual imaging followup by CTA or MRA. This recommendation follows
2212 ACCF/AHA/AATS/ACR/ASA/SCA/FRANCLI/HAROLDT/KOYA/BAHCSINYAN Guidelines for the
Diagnosis and Management of Patients with Thoracic Aortic Disease.
Circulation. 2212; 121: [id number]. Aortic aneurysm NOS (6XO4I-HU8.R)
2. Borderline dilated main pulmonary artery.
3. Mild bilateral paraseptal bullous emphysema.
4. Mild aortic atherosclerosis.

Aortic Atherosclerosis (6XO4I-PAL.L) and Emphysema (6XO4I-81L.G).

## 2020-10-16 DIAGNOSIS — M47812 Spondylosis without myelopathy or radiculopathy, cervical region: Secondary | ICD-10-CM | POA: Diagnosis not present

## 2020-10-16 DIAGNOSIS — M9903 Segmental and somatic dysfunction of lumbar region: Secondary | ICD-10-CM | POA: Diagnosis not present

## 2020-10-16 DIAGNOSIS — M9902 Segmental and somatic dysfunction of thoracic region: Secondary | ICD-10-CM | POA: Diagnosis not present

## 2020-10-16 DIAGNOSIS — M47817 Spondylosis without myelopathy or radiculopathy, lumbosacral region: Secondary | ICD-10-CM | POA: Diagnosis not present

## 2020-10-16 DIAGNOSIS — S39012A Strain of muscle, fascia and tendon of lower back, initial encounter: Secondary | ICD-10-CM | POA: Diagnosis not present

## 2020-10-16 DIAGNOSIS — M9901 Segmental and somatic dysfunction of cervical region: Secondary | ICD-10-CM | POA: Diagnosis not present

## 2020-10-18 ENCOUNTER — Encounter: Payer: Self-pay | Admitting: Family Medicine

## 2020-10-19 MED ORDER — PREDNISONE 20 MG PO TABS
20.0000 mg | ORAL_TABLET | Freq: Every day | ORAL | 0 refills | Status: DC
Start: 2020-10-19 — End: 2020-11-15

## 2020-10-19 NOTE — Telephone Encounter (Signed)
Spoke to patient on the phone.  He denies any increased cough congestion or fever.  Does have some intermittent wheezing and shortness of breath.  Covid test was negative.  He remains on Trelegy inhaler daily.  We will send prednisone 20 mg daily for 5 days.  Advised if symptoms or not improving or worsen he will need sooner follow-up or to go to the emergency room or urgent care for further evaluation  Please contact office for sooner follow up if symptoms do not improve or worsen or seek emergency care

## 2020-10-19 NOTE — Telephone Encounter (Signed)
Called pt d/t nature of the email:  Last night pt felt increased fatigue and had chills but did not take temp or any medications.  This morning pt woke up with increased sob, some mild wheezing after moving around.  Denies fever, cough, mucus production, chest pain or tightness, loss of taste/smell, night sweats. No known sick contacts in the past week.   Pt took tylenol to help with s/s around 0600. Pt also took a rapid Covid test this morning which was negative. Requesting recs.  Pt does have a rescue inhaler, is also on Trelegy 100 daily.   Pharmacy: [facility] on [address]. Pt states he cannot take any abx.   TP please advise on recs, thanks!

## 2020-10-22 ENCOUNTER — Telehealth: Payer: Self-pay | Admitting: Gastroenterology

## 2020-10-22 NOTE — Telephone Encounter (Signed)
Okay we can see him, no problem. Thanks

## 2020-10-22 NOTE — Telephone Encounter (Signed)
Hey Dr. Havery Moros.  This patient is being referred for chronic diarrhea. He recently saw [name] but wants to transfer care because he is retiring. Most of his records are in Care everywhere. We have received his latest colon report via fax and will send to you for review and revise and scheduling.   Thank you.

## 2020-10-23 ENCOUNTER — Encounter: Payer: Self-pay | Admitting: Gastroenterology

## 2020-10-23 DIAGNOSIS — M9903 Segmental and somatic dysfunction of lumbar region: Secondary | ICD-10-CM | POA: Diagnosis not present

## 2020-10-23 DIAGNOSIS — M9901 Segmental and somatic dysfunction of cervical region: Secondary | ICD-10-CM | POA: Diagnosis not present

## 2020-10-23 DIAGNOSIS — M9902 Segmental and somatic dysfunction of thoracic region: Secondary | ICD-10-CM | POA: Diagnosis not present

## 2020-10-23 DIAGNOSIS — S39012A Strain of muscle, fascia and tendon of lower back, initial encounter: Secondary | ICD-10-CM | POA: Diagnosis not present

## 2020-10-23 DIAGNOSIS — M47817 Spondylosis without myelopathy or radiculopathy, lumbosacral region: Secondary | ICD-10-CM | POA: Diagnosis not present

## 2020-10-23 DIAGNOSIS — M47812 Spondylosis without myelopathy or radiculopathy, cervical region: Secondary | ICD-10-CM | POA: Diagnosis not present

## 2020-10-23 NOTE — Telephone Encounter (Signed)
Scheduled patient 11/29/20 @ 2:10pm

## 2020-10-24 ENCOUNTER — Encounter: Payer: Self-pay | Admitting: Family Medicine

## 2020-10-26 ENCOUNTER — Encounter: Payer: Self-pay | Admitting: Family Medicine

## 2020-11-02 DIAGNOSIS — S39012A Strain of muscle, fascia and tendon of lower back, initial encounter: Secondary | ICD-10-CM | POA: Diagnosis not present

## 2020-11-02 DIAGNOSIS — M47817 Spondylosis without myelopathy or radiculopathy, lumbosacral region: Secondary | ICD-10-CM | POA: Diagnosis not present

## 2020-11-02 DIAGNOSIS — M9901 Segmental and somatic dysfunction of cervical region: Secondary | ICD-10-CM | POA: Diagnosis not present

## 2020-11-02 DIAGNOSIS — M47812 Spondylosis without myelopathy or radiculopathy, cervical region: Secondary | ICD-10-CM | POA: Diagnosis not present

## 2020-11-02 DIAGNOSIS — M9902 Segmental and somatic dysfunction of thoracic region: Secondary | ICD-10-CM | POA: Diagnosis not present

## 2020-11-02 DIAGNOSIS — M9903 Segmental and somatic dysfunction of lumbar region: Secondary | ICD-10-CM | POA: Diagnosis not present

## 2020-11-05 ENCOUNTER — Encounter: Payer: Self-pay | Admitting: Family Medicine

## 2020-11-05 MED ORDER — DIAZEPAM 2 MG PO TABS: ORAL_TABLET | ORAL | 0 refills | Status: DC

## 2020-11-05 MED ORDER — METAXALONE 800 MG PO TABS: 800.0000 mg | ORAL_TABLET | Freq: Three times a day (TID) | ORAL | 0 refills | Status: DC | PRN

## 2020-11-05 MED ORDER — AMLODIPINE BESYLATE 2.5 MG PO TABS: 2.5000 mg | ORAL_TABLET | Freq: Every day | ORAL | 0 refills | Status: DC

## 2020-11-15 ENCOUNTER — Ambulatory Visit (INDEPENDENT_AMBULATORY_CARE_PROVIDER_SITE_OTHER): Payer: PPO

## 2020-11-15 DIAGNOSIS — Z0000 Encounter for general adult medical examination without abnormal findings: Secondary | ICD-10-CM | POA: Diagnosis not present

## 2020-11-15 NOTE — Patient Instructions (Addendum)
George Watson , Thank you for taking time to come for your Medicare Wellness Visit. I appreciate your ongoing commitment to your health goals. Please review the following plan we discussed and let me know if I can assist you in the future.   Screening recommendations/referrals: Colonoscopy: Done 08/24/17 Recommended yearly ophthalmology/optometry visit for glaucoma screening and checkup Recommended yearly dental visit for hygiene and checkup  Vaccinations: Influenza vaccine: Up to date Pneumococcal vaccine: Up to date Tdap vaccine: Up to date Shingles vaccine: Shingrix , Please contact your pharmacy for coverage information.    Covid-19: Completed 1/20, 2/10, & 05/21/20  Advanced directives: Please bring a copy of your health care power of attorney and living will to the office at your convenience.  Conditions/risks identified: Continue to stay healthy and active   Next appointment: Follow up in one year for your annual wellness visit.   Preventive Care 60 Years and Older, Male Preventive care refers to lifestyle choices and visits with your health care provider that can promote health and wellness. What does preventive care include?  A yearly physical exam. This is also called an annual well check.  Dental exams once or twice a year.  Routine eye exams. Ask your health care provider how often you should have your eyes checked.  Personal lifestyle choices, including:  Daily care of your teeth and gums.  Regular physical activity.  Eating a healthy diet.  Avoiding tobacco and drug use.  Limiting alcohol use.  Practicing safe sex.  Taking low doses of aspirin every day.  Taking vitamin and mineral supplements as recommended by your health care provider. What happens during an annual well check? The services and screenings done by your health care provider during your annual well check will depend on your age, overall health, lifestyle risk factors, and family history of  disease. Counseling  Your health care provider may ask you questions about your:  Alcohol use.  Tobacco use.  Drug use.  Emotional well-being.  Home and relationship well-being.  Sexual activity.  Eating habits.  History of falls.  Memory and ability to understand (cognition).  Work and work Statistician. Screening  You may have the following tests or measurements:  Height, weight, and BMI.  Blood pressure.  Lipid and cholesterol levels. These may be checked every 5 years, or more frequently if you are over 11 years old.  Skin check.  Lung cancer screening. You may have this screening every year starting at age 70 if you have a 30-pack-year history of smoking and currently smoke or have quit within the past 15 years.  Fecal occult blood test (FOBT) of the stool. You may have this test every year starting at age 56.  Flexible sigmoidoscopy or colonoscopy. You may have a sigmoidoscopy every 5 years or a colonoscopy every 10 years starting at age 53.  Prostate cancer screening. Recommendations will vary depending on your family history and other risks.  Hepatitis C blood test.  Hepatitis B blood test.  Sexually transmitted disease (STD) testing.  Diabetes screening. This is done by checking your blood sugar (glucose) after you have not eaten for a while (fasting). You may have this done every 1-3 years.  Abdominal aortic aneurysm (AAA) screening. You may need this if you are a current or former smoker.  Osteoporosis. You may be screened starting at age 39 if you are at high risk. Talk with your health care provider about your test results, treatment options, and if necessary, the need for more  tests. Vaccines  Your health care provider may recommend certain vaccines, such as:  Influenza vaccine. This is recommended every year.  Tetanus, diphtheria, and acellular pertussis (Tdap, Td) vaccine. You may need a Td booster every 10 years.  Zoster vaccine. You may  need this after age 74.  Pneumococcal 13-valent conjugate (PCV13) vaccine. One dose is recommended after age 50.  Pneumococcal polysaccharide (PPSV23) vaccine. One dose is recommended after age 68. Talk to your health care provider about which screenings and vaccines you need and how often you need them. This information is not intended to replace advice given to you by your health care provider. Make sure you discuss any questions you have with your health care provider. Document Released: 08/03/2015 Document Revised: 03/26/2016 Document Reviewed: 05/08/2015 Elsevier Interactive Patient Education  2017 [place] Prevention in the Home Falls can cause injuries. They can happen to people of all ages. There are many things you can do to make your home safe and to help prevent falls. What can I do on the outside of my home?  Regularly fix the edges of walkways and driveways and fix any cracks.  Remove anything that might make you trip as you walk through a door, such as a raised step or threshold.  Trim any bushes or trees on the path to your home.  Use bright outdoor lighting.  Clear any walking paths of anything that might make someone trip, such as rocks or tools.  Regularly check to see if handrails are loose or broken. Make sure that both sides of any steps have handrails.  Any raised decks and porches should have guardrails on the edges.  Have any leaves, snow, or ice cleared regularly.  Use sand or salt on walking paths during winter.  Clean up any spills in your garage right away. This includes oil or grease spills. What can I do in the bathroom?  Use night lights.  Install grab bars by the toilet and in the tub and shower. Do not use towel bars as grab bars.  Use non-skid mats or decals in the tub or shower.  If you need to sit down in the shower, use a plastic, non-slip stool.  Keep the floor dry. Clean up any water that spills on the floor as soon as it  happens.  Remove soap buildup in the tub or shower regularly.  Attach bath mats securely with double-sided non-slip rug tape.  Do not have throw rugs and other things on the floor that can make you trip. What can I do in the bedroom?  Use night lights.  Make sure that you have a light by your bed that is easy to reach.  Do not use any sheets or blankets that are too big for your bed. They should not hang down onto the floor.  Have a firm chair that has side arms. You can use this for support while you get dressed.  Do not have throw rugs and other things on the floor that can make you trip. What can I do in the kitchen?  Clean up any spills right away.  Avoid walking on wet floors.  Keep items that you use a lot in easy-to-reach places.  If you need to reach something above you, use a strong step stool that has a grab bar.  Keep electrical cords out of the way.  Do not use floor polish or wax that makes floors slippery. If you must use wax, use non-skid floor  wax.  Do not have throw rugs and other things on the floor that can make you trip. What can I do with my stairs?  Do not leave any items on the stairs.  Make sure that there are handrails on both sides of the stairs and use them. Fix handrails that are broken or loose. Make sure that handrails are as long as the stairways.  Check any carpeting to make sure that it is firmly attached to the stairs. Fix any carpet that is loose or worn.  Avoid having throw rugs at the top or bottom of the stairs. If you do have throw rugs, attach them to the floor with carpet tape.  Make sure that you have a light switch at the top of the stairs and the bottom of the stairs. If you do not have them, ask someone to add them for you. What else can I do to help prevent falls?  Wear shoes that:  Do not have high heels.  Have rubber bottoms.  Are comfortable and fit you well.  Are closed at the toe. Do not wear sandals.  If you  use a stepladder:  Make sure that it is fully opened. Do not climb a closed stepladder.  Make sure that both sides of the stepladder are locked into place.  Ask someone to hold it for you, if possible.  Clearly mark and make sure that you can see:  Any grab bars or handrails.  First and last steps.  Where the edge of each step is.  Use tools that help you move around (mobility aids) if they are needed. These include:  Canes.  Walkers.  Scooters.  Crutches.  Turn on the lights when you go into a dark area. Replace any light bulbs as soon as they burn out.  Set up your furniture so you have a clear path. Avoid moving your furniture around.  If any of your floors are uneven, fix them.  If there are any pets around you, be aware of where they are.  Review your medicines with your doctor. Some medicines can make you feel dizzy. This can increase your chance of falling. Ask your doctor what other things that you can do to help prevent falls. This information is not intended to replace advice given to you by your health care provider. Make sure you discuss any questions you have with your health care provider. Document Released: 05/03/2009 Document Revised: 12/13/2015 Document Reviewed: 08/11/2014 Elsevier Interactive Patient Education  2017 [employer].

## 2020-11-15 NOTE — Progress Notes (Signed)
Virtual Visit via Telephone Note  I connected with  George Watson on 11/15/20 at  1:45 PM EDT by telephone and verified that I am speaking with the correct person using two identifiers.  Location: Patient: Home Provider: office Persons participating in the virtual visit: patient/Nurse Health Advisor   I discussed the limitations, risks, security and privacy concerns of performing an evaluation and management service by telephone and the availability of in person appointments. The patient expressed understanding and agreed to proceed.  Interactive audio and video telecommunications were attempted between this nurse and patient, however failed, due to patient having technical difficulties OR patient did not have access to video capability.  We continued and completed visit with audio only.  Some vital signs may be absent or patient reported.   Willette Brace, LPN    Subjective:   George Watson is a 75 y.o. male who presents for Medicare Annual/Subsequent preventive examination.  Review of Systems     Cardiac Risk Factors include: advanced age (>72men, >16 women);hypertension;male gender;dyslipidemia     Objective:    Today's Vitals   11/15/20 1348  PainSc: 2    There is no height or weight on file to calculate BMI.  Advanced Directives 11/15/2020 10/10/2019 07/07/2019 07/07/2019 07/07/2019 04/02/2017 06/29/2012  Does Patient Have a Medical Advance Directive? Yes Yes Yes Yes No No;Yes Patient has advance directive, copy not in chart  Type of Advance Directive Living will Living will;Healthcare Power of [place] will Living will - - Living will  Does patient want to make changes to medical advance directive? - No - Patient declined No - Patient declined - - - -  Copy of [place] in Chart? - No - copy requested - - - - Copy requested from family  Would patient like information on creating a medical advance directive? - - No - Patient declined No -  Patient declined No - Patient declined - -  Pre-existing out of facility DNR order (yellow form or pink MOST form) - - - - - - No    Current Medications (verified) Outpatient Encounter Medications as of 11/15/2020  Medication Sig  . albuterol (VENTOLIN HFA) 108 (90 Base) MCG/ACT inhaler INHALE 2 PUFFS INTO THE LUNGS EVERY 6 (SIX) HOURS AS NEEDED FOR SHORTNESS OF BREATH.  Marland Kitchen aspirin EC 81 MG EC tablet Take 1 tablet (81 mg total) by mouth daily.  . cholecalciferol (VITAMIN D3) 25 MCG (1000 UT) tablet Take 1,000 Units by mouth daily.  . Coenzyme Q10-Vitamin E 100-300 MG-UNIT CHEW Chew by mouth.  . diphenoxylate-atropine (LOMOTIL) 2.5-0.025 MG tablet Take 1 tablet by mouth 4 (four) times daily as needed for diarrhea or loose stools.  . dutasteride (AVODART) 0.5 MG capsule Take 0.5 mg by mouth daily.  . famotidine (PEPCID) 20 MG tablet Take 20 mg by mouth every evening.   Marland Kitchen glucosamine-chondroitin 500-400 MG tablet Take 3 tablets by mouth daily. (Patient taking differently: Take 2 tablets by mouth daily.)  . losartan (COZAAR) 100 MG tablet TAKE 1 TABLET BY MOUTH EVERY DAY  . metaxalone (SKELAXIN) 800 MG tablet Take 1 tablet (800 mg total) by mouth 3 (three) times daily as needed for muscle spasms.  . Multiple Vitamins-Minerals (CENTRUM SILVER PO) Take by mouth.  . mupirocin cream (BACTROBAN) 2 % Apply 1 application topically 2 (two) times daily.  . Omega-3 Fatty Acids (FISH OIL PO) Take 1 tablet by mouth daily.  . rosuvastatin (CRESTOR) 5 MG tablet Take 1 tablet (  5 mg total) by mouth daily.  . tamsulosin (FLOMAX) 0.4 MG CAPS capsule Take 0.4 mg by mouth daily.   . TRELEGY ELLIPTA 100-62.5-25 MCG/INH AEPB TAKE 1 PUFF BY MOUTH EVERY DAY  . TURMERIC CURCUMIN PO Take by mouth.  . diazepam (VALIUM) 2 MG tablet Take 1-2 tabs 30 minutes before flight. (Patient not taking: Reported on 11/15/2020)  . FLUAD QUADRIVALENT 0.5 ML injection   . [DISCONTINUED] amLODipine (NORVASC) 2.5 MG tablet Take 1 tablet (2.5  mg total) by mouth daily. (Patient not taking: Reported on 11/15/2020)  . [DISCONTINUED] olmesartan (BENICAR) 40 MG tablet TAKE 1 TABLET BY MOUTH EVERY DAY  . [DISCONTINUED] predniSONE (DELTASONE) 20 MG tablet Take 1 tablet (20 mg total) by mouth daily with breakfast. (Patient not taking: Reported on 11/15/2020)   No facility-administered encounter medications on file as of 11/15/2020.    Allergies (verified) Patient has no known allergies.   History: Past Medical History:  Diagnosis Date  . BPH (benign prostatic hypertrophy)   . COPD (chronic obstructive pulmonary disease) ([place])   . DDD (degenerative disc disease), lumbosacral   . Elevated PSA   . Essential hypertension, benign    diet controlled  . Fracture of shaft of clavicle   . GERD (gastroesophageal reflux disease)   . H/O drug abuse ([place])    multisubstance  . Hepatitis C 10/1996  . HSV-2 (herpes simplex virus 2) infection   . Hx of biopsy 08/2004   liver  . Inguinal hernia    right  . Other and unspecified hyperlipidemia   . Prostate cancer ([place])   . Substance abuse ([place])   . Tuberculosis    Past Surgical History:  Procedure Laterality Date  . CERVICAL SPINE SURGERY    . fracture ribs     3  . HERNIA REPAIR    . INGUINAL HERNIA REPAIR  07/01/2012   Procedure: HERNIA REPAIR INGUINAL ADULT;  Surgeon: Madilyn Hook, DO;  Location: [facility];  Service: General;  Laterality: Right;  with Mesh  . left knee meniscus repair  1992  . right rotator cuff  1992  . right shoulder arthroscopy  2011   Family History  Problem Relation Age of Onset  . Cancer Sister        breast  . Leukemia Mother   . Cancer Father    Social History   Socioeconomic History  . Marital status: Married    Spouse name: Not on file  . Number of children: Not on file  . Years of education: Not on file  . Highest education level: Not on file  Occupational History  . Occupation: Geophysicist/field seismologist  . Occupation: Retired     Fish farm manager: Theatre manager     Comment: 20+ yrs   Tobacco Use  . Smoking status: Former Smoker    Years: 43.00    Types: Cigarettes    Quit date: 07/21/2002    Years since quitting: 18.3  . Smokeless tobacco: Never Used  Vaping Use  . Vaping Use: Never used  Substance and Sexual Activity  . Alcohol use: No  . Drug use: No    Comment: 26 years ago cocaine, heroin. methadone  . Sexual activity: Yes    Comment: number of sex partners in the last 103 months  1  Other Topics Concern  . Not on file  Social History Narrative      Married to [employer]. Exercise cardio daily for 30 minutes. Education: [employer].   Social Determinants of Health  Financial Resource Strain: Low Risk   . Difficulty of Paying Living Expenses: Not hard at all  Food Insecurity: No Food Insecurity  . Worried About Charity fundraiser in the Last Year: Never true  . Ran Out of Food in the Last Year: Never true  Transportation Needs: No Transportation Needs  . Lack of Transportation (Medical): No  . Lack of Transportation (Non-Medical): No  Physical Activity: Sufficiently Active  . Days of Exercise per Week: 7 days  . Minutes of Exercise per Session: 60 min  Stress: Not on file  Social Connections: Moderately Integrated  . Frequency of Communication with Friends and Family: More than three times a week  . Frequency of Social Gatherings with Friends and Family: Three times a week  . Attends Religious Services: Never  . Active Member of Clubs or Organizations: Yes  . Attends Archivist Meetings: 1 to 4 times per year  . Marital Status: Married    Tobacco Counseling Counseling given: Not Answered   Clinical Intake:  Pre-visit preparation completed: Yes  Pain : 0-10 Pain Score: 2  Pain Type: Chronic pain Pain Orientation: Lower Pain Descriptors / Indicators: Aching Pain Onset: More than a month ago Pain Frequency: Intermittent     BMI - recorded: 23.55 Nutritional Status: BMI of 19-24  Normal Nutritional Risks:  None Diabetes: No  How often do you need to have someone help you when you read instructions, pamphlets, or other written materials from your doctor or pharmacy?: 1 - Never  Diabetic?No  Interpreter Needed?: No  Information entered by :: Charlott Rakes, LPN   Activities of Daily Living In your present state of health, do you have any difficulty performing the following activities: 11/15/2020  Hearing? N  Vision? N  Difficulty concentrating or making decisions? Y  Comment Memory at times STM  Walking or climbing stairs? N  Dressing or bathing? N  Doing errands, shopping? N  Preparing Food and eating ? N  Using the Toilet? N  In the past six months, have you accidently leaked urine? N  Do you have problems with loss of bowel control? N  Managing your Medications? N  Managing your Finances? N  Housekeeping or managing your Housekeeping? N  Some recent data might be hidden    Patient Care Team: [name], MD as PCP - General (Family Medicine) Verl Blalock, Marijo Conception, MD (Inactive) (Cardiology) Richmond Campbell, MD (Gastroenterology) Newt Minion, MD (Orthopedic Surgery) Kathie Rhodes, MD (Inactive) (Urology) Jola Schmidt, MD as Consulting Physician (Ophthalmology) Danella Sensing, MD as Consulting Physician (Dermatology)  Indicate any recent Medical Services you may have received from other than [facility] providers in the past year (date may be approximate).     Assessment:   This is a routine wellness examination for [facility].  Hearing/Vision screen  Hearing Screening   125Hz  250Hz  500Hz  1000Hz  2000Hz  3000Hz  4000Hz  6000Hz  8000Hz   Right ear:           Left ear:           Comments: Pt denies and hearing issues   Vision Screening Comments: Pt follows up with [place] [facility] for annual eye exams   Dietary issues and exercise activities discussed: Current Exercise Habits: Home exercise routine, Type of exercise: Other - see comments (stationary bike), Time (Minutes): 60,  Frequency (Times/Week): 7, Weekly Exercise (Minutes/Week): 420  Goals    . Patient Stated     Continue to stay healthy and active  Depression Screen PHQ 2/9 Scores 11/15/2020 04/12/2020 10/10/2019 08/10/2019 06/13/2019 06/09/2018 01/24/2016  PHQ - 2 Score 0 0 0 0 0 0 0    Fall Risk Fall Risk  11/15/2020 04/12/2020 10/10/2019 06/15/2019 06/10/2018  Falls in the past year? 0 0 0 0 0  Comment - - - [place] Telephone Survey: data to providers prior to load -  Number falls in past yr: 0 - 0 - -  Injury with Fall? 0 - 0 - -  Risk for fall due to : - No Fall Risks - - -  Follow up Falls prevention discussed - Falls evaluation completed;Education provided;Falls prevention discussed - -    FALL RISK PREVENTION PERTAINING TO THE HOME:  Any stairs in or around the home? Yes  If so, are there any without handrails? No  Home free of loose throw rugs in walkways, pet beds, electrical cords, etc? Yes  Adequate lighting in your home to reduce risk of falls? Yes   ASSISTIVE DEVICES UTILIZED TO PREVENT FALLS:  Life alert? No  Use of a cane, walker or w/c? No  Grab bars in the bathroom? No  Shower chair or bench in shower? No  Elevated toilet seat or a handicapped toilet? No   TIMED UP AND GO:  Was the test performed? No .       Cognitive Function:     6CIT Screen 11/15/2020 10/10/2019  What Year? 0 points 0 points  What month? 0 points 0 points  What time? - 0 points  Count back from 20 0 points 0 points  Months in reverse 0 points 0 points  Repeat phrase 0 points 0 points  Total Score - 0    Immunizations Immunization History  Administered Date(s) Administered  . Fluad Quad(high Dose 65+) 03/29/2019  . Hepatitis A 02/18/1998, 08/21/1998  . Influenza Split 04/01/2011, 05/18/2012  . Influenza, High Dose Seasonal PF 04/23/2016, 03/17/2018  . Influenza, Seasonal, Injecte, Preservative Fre 05/01/2017  . Influenza,inj,Quad PF,6+ Mos 04/28/2013, 04/21/2014  . Influenza-Unspecified  04/21/2015, 05/21/2020  . PFIZER(Purple Top)SARS-COV-2 Vaccination 08/10/2019, 08/31/2019, 05/21/2020  . Pneumococcal Conjugate-13 08/22/2014  . Pneumococcal Polysaccharide-23 07/21/2002, 07/08/2016  . Tdap 11/18/2005, 01/03/2015    TDAP status: Up to date  Flu Vaccine status: Up to date  Pneumococcal vaccine status: Up to date  Covid-19 vaccine status: Completed vaccines  Qualifies for Shingles Vaccine? Yes   Zostavax completed No   Shingrix Completed?: No.    Education has been provided regarding the importance of this vaccine. Patient has been advised to call insurance company to determine out of pocket expense if they have not yet received this vaccine. Advised may also receive vaccine at local pharmacy or Health Dept. Verbalized acceptance and understanding.  Screening Tests Health Maintenance  Topic Date Due  . COVID-19 Vaccine (4 - Booster for Pfizer series) 11/18/2020  . INFLUENZA VACCINE  02/18/2021  . TETANUS/TDAP  01/02/2025  . COLONOSCOPY (Pts 45-76yrs Insurance coverage will need to be confirmed)  08/25/2027  . Hepatitis C Screening  Completed  . PNA vac Low Risk Adult  Completed  . HPV VACCINES  Aged Out    Health Maintenance  Health Maintenance Due  Topic Date Due  . COVID-19 Vaccine (4 - Booster for Pfizer series) 11/18/2020    Colorectal cancer screening: Type of screening: Colonoscopy. Completed 08/24/17. Repeat every 10 years    Additional Screening:  Hepatitis C Screening:  Completed 10/10/20  Vision Screening: Recommended annual ophthalmology exams for early detection of glaucoma and  other disorders of the eye. Is the patient up to date with their annual eye exam?  Yes  Who is the provider or what is the name of the office in which the patient attends annual eye exams? [facility]  If pt is not established with a provider, would they like to be referred to a provider to establish care? No .   Dental Screening: Recommended annual dental  exams for proper oral hygiene  Community Resource Referral / Chronic Care Management: CRR required this visit?  No   CCM required this visit?  No      Plan:     I have personally reviewed and noted the following in the patient's chart:   . Medical and social history . Use of alcohol, tobacco or illicit drugs  . Current medications and supplements . Functional ability and status . Nutritional status . Physical activity . Advanced directives . List of other physicians . Hospitalizations, surgeries, and ER visits in previous 12 months . Vitals . Screenings to include cognitive, depression, and falls . Referrals and appointments  In addition, I have reviewed and discussed with patient certain preventive protocols, quality metrics, and best practice recommendations. A written personalized care plan for preventive services as well as general preventive health recommendations were provided to patient.     Willette Brace, LPN   2/45/8099   Nurse Notes: None

## 2020-11-19 ENCOUNTER — Encounter: Payer: Self-pay | Admitting: Family Medicine

## 2020-11-21 DIAGNOSIS — S39012A Strain of muscle, fascia and tendon of lower back, initial encounter: Secondary | ICD-10-CM | POA: Diagnosis not present

## 2020-11-21 DIAGNOSIS — M47817 Spondylosis without myelopathy or radiculopathy, lumbosacral region: Secondary | ICD-10-CM | POA: Diagnosis not present

## 2020-11-21 DIAGNOSIS — M9903 Segmental and somatic dysfunction of lumbar region: Secondary | ICD-10-CM | POA: Diagnosis not present

## 2020-11-21 DIAGNOSIS — M9902 Segmental and somatic dysfunction of thoracic region: Secondary | ICD-10-CM | POA: Diagnosis not present

## 2020-11-21 DIAGNOSIS — M47812 Spondylosis without myelopathy or radiculopathy, cervical region: Secondary | ICD-10-CM | POA: Diagnosis not present

## 2020-11-21 DIAGNOSIS — M9901 Segmental and somatic dysfunction of cervical region: Secondary | ICD-10-CM | POA: Diagnosis not present

## 2020-11-23 ENCOUNTER — Other Ambulatory Visit: Payer: Self-pay

## 2020-11-23 ENCOUNTER — Ambulatory Visit (INDEPENDENT_AMBULATORY_CARE_PROVIDER_SITE_OTHER): Payer: PPO | Admitting: Family Medicine

## 2020-11-23 DIAGNOSIS — I10 Essential (primary) hypertension: Secondary | ICD-10-CM

## 2020-11-23 MED ORDER — HYDROCHLOROTHIAZIDE 12.5 MG PO CAPS: 12.5000 mg | ORAL_CAPSULE | Freq: Every day | ORAL | 3 refills | Status: DC

## 2020-11-23 NOTE — Progress Notes (Signed)
Patient: George Watson MRN: [id number] DOB: 27-May-1946 PCP: [name], MD     Subjective:  Chief Complaint  Patient presents with  . Hypertension    He states medication changes have not been effective. Medication has also caused leg cramps.     HPI: The patient is a 75 y.o. male who presents today for HTN. He is on cozaar 100mg /day and I added on norvasc 5mg  and he mistakenly stopped the losartan so he saw no change in his blood pressure, rightly so. He also had bad leg cramping with the norvasc which stopped when he stopped medication.   Review of Systems  Constitutional: Negative for chills, fatigue and fever.  HENT: Negative for dental problem, ear pain, hearing loss and trouble swallowing.   Eyes: Negative for visual disturbance.  Respiratory: Negative for cough, chest tightness and shortness of breath.   Cardiovascular: Negative for chest pain, palpitations and leg swelling.  Gastrointestinal: Negative for abdominal pain, blood in stool, diarrhea and nausea.  Endocrine: Negative for cold intolerance, polydipsia, polyphagia and polyuria.  Genitourinary: Negative for dysuria and hematuria.  Musculoskeletal: Negative for arthralgias.  Skin: Negative for rash.  Neurological: Negative for dizziness and headaches.  Psychiatric/Behavioral: Negative for dysphoric mood and sleep disturbance. The patient is not nervous/anxious.     Allergies Patient is allergic to amlodipine.  Past Medical History Patient  has a past medical history of Aortic aneurysm ([place]), Arthritis, BPH (benign prostatic hypertrophy), COPD (chronic obstructive pulmonary disease) ([place]), DDD (degenerative disc disease), lumbosacral, Elevated PSA, Emphysema of lung ([place]), Essential hypertension, benign, Fracture of shaft of clavicle, GERD (gastroesophageal reflux disease), H/O drug abuse ([place]), Hepatitis C (10/1996), HSV-2 (herpes simplex virus 2) infection, biopsy (08/2004), colonic polyps (2022),  Hyperlipidemia, Inguinal hernia, Other and unspecified hyperlipidemia, Prostate cancer ([place]), Substance abuse ([place]), and Tuberculosis.  Surgical History Patient  has a past surgical history that includes fracture ribs; Cervical spine surgery; right shoulder arthroscopy (2011); right rotator cuff (1992); left knee meniscus repair (1992); Inguinal hernia repair (07/01/2012); and Hernia repair.  Family History [name]'s family history includes Cancer in his father and sister; Leukemia in his mother; Melanoma in his brother; Prostate cancer in his brother.  Social History Patient  reports that he quit smoking about 18 years ago. His smoking use included cigarettes. He quit after 43.00 years of use. He has never used smokeless tobacco. He reports that he does not drink alcohol and does not use drugs.    Objective: Vitals:   11/23/20 1310 11/23/20 1333  BP: (!) 142/78 (!) 150/70  Pulse: 71   Temp: 98.5 F (36.9 C)   TempSrc: Temporal   SpO2: 96%   Weight: 167 lb 12.8 oz (76.1 kg)   Height: 5\' 11"  (1.803 m)     Body mass index is 23.4 kg/m.  Physical Exam Vitals reviewed.  Constitutional:      Appearance: He is well-developed.  HENT:     Right Ear: External ear normal.     Left Ear: External ear normal.  Eyes:     Conjunctiva/sclera: Conjunctivae normal.     Pupils: Pupils are equal, round, and reactive to light.  Neck:     Thyroid: No thyromegaly.  Cardiovascular:     Rate and Rhythm: Normal rate and regular rhythm.     Heart sounds: Normal heart sounds. No murmur heard.   Pulmonary:     Effort: Pulmonary effort is normal.     Breath sounds: Normal breath sounds.  Abdominal:  General: Bowel sounds are normal. There is no distension.     Palpations: Abdomen is soft.     Tenderness: There is no abdominal tenderness.  Musculoskeletal:     Cervical back: Normal range of motion and neck supple.  Lymphadenopathy:     Cervical: No cervical adenopathy.  Skin:    General:  Skin is warm and dry.     Findings: No rash.  Neurological:     Mental Status: He is alert and oriented to person, place, and time.     Cranial Nerves: No cranial nerve deficit.     Coordination: Coordination normal.     Deep Tendon Reflexes: Reflexes normal.  Psychiatric:        Behavior: Behavior normal.        Assessment/plan: 1. Essential hypertension, benign Discussed with his hx of TIA I ideally want his blood pressure <130/80. Could not tolerate novasc due to leg cramping. Will add on 12.5mg  of hctz with his losartan. Discussed he will take both medications together. He already has a f/u with dr. Jerline Pain who can recheck this at that time.     Return for keep appointment wiith dr. Jerline Pain for bp check after adding hctz. [name], MD [place]   11/23/2020

## 2020-11-23 NOTE — Patient Instructions (Addendum)
-  keep taking your losartan!  -adding on hctz 12.5mg /day.  -goal bp 130/80 or less   Just keep appointment with dr. Jerline Pain in June for him to check this.

## 2020-11-27 ENCOUNTER — Other Ambulatory Visit: Payer: Self-pay | Admitting: Family Medicine

## 2020-11-29 ENCOUNTER — Ambulatory Visit: Payer: PPO | Admitting: Gastroenterology

## 2020-11-29 ENCOUNTER — Encounter: Payer: Self-pay | Admitting: Gastroenterology

## 2020-11-29 ENCOUNTER — Other Ambulatory Visit (INDEPENDENT_AMBULATORY_CARE_PROVIDER_SITE_OTHER): Payer: PPO

## 2020-11-29 ENCOUNTER — Other Ambulatory Visit: Payer: Self-pay

## 2020-11-29 VITALS — BP 112/62 | HR 75 | Ht 71.0 in | Wt 165.2 lb

## 2020-11-29 DIAGNOSIS — K580 Irritable bowel syndrome with diarrhea: Secondary | ICD-10-CM | POA: Diagnosis not present

## 2020-11-29 DIAGNOSIS — K7460 Unspecified cirrhosis of liver: Secondary | ICD-10-CM

## 2020-11-29 LAB — PROTIME-INR
INR: 1.1 ratio — ABNORMAL HIGH (ref 0.8–1.0)
Prothrombin Time: 12.6 s (ref 9.6–13.1)

## 2020-11-29 MED ORDER — VIBERZI 75 MG PO TABS: 75.0000 mg | ORAL_TABLET | Freq: Two times a day (BID) | ORAL | 0 refills | Status: DC

## 2020-11-29 NOTE — Progress Notes (Signed)
HPI :  75 y/o male with a history of chronic diarrhea, E coli infection, COPD, history of hepatitis C / drug abuse, suspected cirrhosis, here to establish care for some of these issues, referred by [name] MD.   Patient states he had 4 regular bowel movements per day at baseline but then developed relatively acute onset of loose stools in August 2021.  He was having loose watery stools that progressed after onset over a week, upwards of 10-13 times per day.  Denies any blood in his stools or any significant abdominal pain.  Symptoms persisted over time, it sounds like he tested positive for an E. coli infection on 2 different occasions last October.  He was treated with 2 different antibiotics, ciprofloxacin and perhaps azithromycin.  He states his diarrhea persisted to some extent although was not as bad after the antibiotics.  At worst his symptoms included loose watery stools upwards to 13 times a day, but they were unpredictable in nature.  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  He has had an extensive evaluation with celiac serologies which were negative.  He had been tried on a multitude of regimens including Colestid, Imodium, Pepto-Bismol, Lomotil.  He was prescribed rifaximin but states he could not get the prescription filled so never tried it.  He was tried empirically on budesonide which did not provide any benefit.  He lost about 10 pounds during this course but weight has been stable in recent months.  He had a colonoscopy this past February with [name] who has followed him for the past several years.  Biopsies showed no evidence of microscopic colitis.  He does not take any routine NSAID use.  No family history of IBD or colon cancer.  He has not had any new medication changes around the time of this occurrence.  Over time he states his symptoms have gotten better and not as bothersome as before.  He does take Lomotil anywhere from 4-6 tabs per day to control his  symptoms, he is able to function on that regimen and work.  He has not tried tapering down yet but thinks in general things are getting better and is hopeful to do so.  Otherwise he was diagnosed with hepatitis C in 1998.  He states he was treated with multiple rounds of interferon ribavirin which failed.  He thinks around 2014 he was treated at [facility] for hepatitis C and had eradication of virus.  He does not drink any alcohol now but did drink heavily over 30 years ago as well as used IV drugs.  He has been abstinent since that time.  There has been a question of whether or not he has had cirrhosis over the years.  He has had ultrasounds in recent years which suggests cirrhosis on imaging.  He has not had an endoscopy recently for varices screening.  His last ultrasound was in August 2021. He has not had any prior decompensations he is aware of.   Colonoscopy 09/11/20 - 55mm rectal polyp, divertculosis, small hemorrhoids, normal ileum - biopsies taken to rule out microscopic colitis  - polyp sessile serrated, normal colon otherwise - repeat colon in 5 years  [place] 11/20/2017: IMPRESSION: 1. Somewhat nodular contours of the liver may indicate changes of cirrhosis. No focal hepatic abnormality is seen. 2. No gallstones. 3. Abdominal aortic atherosclerosis.  [place] 03/12/20: Hepatomegaly with cirrhotic morphology No focal lesions    Past Medical History:  Diagnosis Date  .  Aortic aneurysm ([place])   . Arthritis   . BPH (benign prostatic hypertrophy)   . COPD (chronic obstructive pulmonary disease) ([place])   . DDD (degenerative disc disease), lumbosacral   . Elevated PSA   . Emphysema of lung ([place])   . Essential hypertension, benign    diet controlled  . Fracture of shaft of clavicle   . GERD (gastroesophageal reflux disease)   . H/O drug abuse ([place])    multisubstance  . Hepatitis C 10/1996  . HSV-2 (herpes simplex virus 2) infection   . Hx of biopsy 08/2004   liver  . Hx of colonic polyps 2022  .  Hyperlipidemia   . Inguinal hernia    right  . Other and unspecified hyperlipidemia   . Prostate cancer ([place])   . Substance abuse ([place])   . Tuberculosis      Past Surgical History:  Procedure Laterality Date  . CERVICAL SPINE SURGERY    . fracture ribs     3  . HERNIA REPAIR    . INGUINAL HERNIA REPAIR  07/01/2012   Procedure: HERNIA REPAIR INGUINAL ADULT;  Surgeon: Madilyn Hook, DO;  Location: [facility];  Service: General;  Laterality: Right;  with Mesh  . left knee meniscus repair  1992  . right rotator cuff  1992  . right shoulder arthroscopy  2011   Family History  Problem Relation Age of Onset  . Breast cancer Sister   . Leukemia Mother   . Throat cancer Father   . Esophageal cancer Father   . Prostate cancer Brother   . Melanoma Brother   . Colon cancer Neg Hx   . Stomach cancer Neg Hx   . Pancreatic cancer Neg Hx   . Liver disease Neg Hx    Social History   Tobacco Use  . Smoking status: Former Smoker    Years: 43.00    Types: Cigarettes    Quit date: 07/21/2002    Years since quitting: 18.3  . Smokeless tobacco: Never Used  Vaping Use  . Vaping Use: Never used  Substance Use Topics  . Alcohol use: No  . Drug use: No    Comment: 26 years ago cocaine, heroin. methadone   Current Outpatient Medications  Medication Sig Dispense Refill  . albuterol (VENTOLIN HFA) 108 (90 Base) MCG/ACT inhaler INHALE 2 PUFFS INTO THE LUNGS EVERY 6 (SIX) HOURS AS NEEDED FOR SHORTNESS OF BREATH. 18 g 1  . aspirin EC 81 MG EC tablet Take 1 tablet (81 mg total) by mouth daily.    . B Complex Vitamins (B COMPLEX PO) Take 1 tablet by mouth daily.    . cholecalciferol (VITAMIN D3) 25 MCG (1000 UT) tablet Take 1,000 Units by mouth daily.    . Coenzyme Q10-Vitamin E 100-300 MG-UNIT CHEW Chew 1 tablet by mouth daily.    . diazepam (VALIUM) 2 MG tablet Take 1-2 tabs 30 minutes before flight. 10 tablet 0  . diphenoxylate-atropine (LOMOTIL) 2.5-0.025 MG tablet Take by mouth See admin  instructions. 2 tablets at the first sign of loose stool, then 1 tablet after each loose stool. Not to exceed 8 tabs per day    . dutasteride (AVODART) 0.5 MG capsule Take 0.5 mg by mouth daily.    . famotidine (PEPCID) 20 MG tablet Take 20 mg by mouth every evening.     Marland Kitchen glucosamine-chondroitin 500-400 MG tablet Take 3 tablets by mouth daily. (Patient taking differently: Take 2 tablets by mouth daily.)    .  hydrochlorothiazide (MICROZIDE) 12.5 MG capsule Take 1 capsule (12.5 mg total) by mouth daily. 90 capsule 3  . losartan (COZAAR) 100 MG tablet TAKE 1 TABLET BY MOUTH EVERY DAY 90 tablet 1  . metaxalone (SKELAXIN) 800 MG tablet Take 1 tablet (800 mg total) by mouth 3 (three) times daily as needed for muscle spasms. 30 tablet 0  . Multiple Vitamins-Minerals (CENTRUM SILVER PO) Take 1 tablet by mouth daily.    . mupirocin cream (BACTROBAN) 2 % Apply 1 application topically 2 (two) times daily. 30 g 1  . Omega-3 Fatty Acids (FISH OIL PO) Take 1 tablet by mouth daily.    . rosuvastatin (CRESTOR) 5 MG tablet Take 1 tablet (5 mg total) by mouth daily. 90 tablet 3  . tamsulosin (FLOMAX) 0.4 MG CAPS capsule Take 0.4 mg by mouth daily.     . TRELEGY ELLIPTA 100-62.5-25 MCG/INH AEPB TAKE 1 PUFF BY MOUTH EVERY DAY 60 each 5  . TURMERIC CURCUMIN PO Take 1 tablet by mouth daily.     No current facility-administered medications for this visit.   Allergies  Allergen Reactions  . Amlodipine Other (See Comments)    Leg cramping      Review of Systems: All systems reviewed and negative except where noted in HPI.   Lab Results  Component Value Date   WBC 9.8 10/15/2020   HGB 15.7 10/15/2020   HCT 46.4 10/15/2020   MCV 91.2 10/15/2020   PLT 238.0 10/15/2020    Lab Results  Component Value Date   CREATININE 1.00 10/15/2020   BUN 21 10/15/2020   NA 139 10/15/2020   K 4.2 10/15/2020   CL 104 10/15/2020   CO2 27 10/15/2020    Lab Results  Component Value Date   ALT 24 10/15/2020   AST 20  10/15/2020   ALKPHOS 57 10/15/2020   BILITOT 0.5 10/15/2020   Lab Results  Component Value Date   INR 1.1 (H) 11/29/2020   INR 1.0 07/07/2019      Physical Exam: BP 112/62   Pulse 75   Ht 5\' 11"  (1.803 m)   Wt 165 lb 3.2 oz (74.9 kg)   SpO2 98%   BMI 23.04 kg/m  Constitutional: Pleasant,well-developed, male in no acute distress. HEENT: Normocephalic and atraumatic. Conjunctivae are normal. No scleral icterus. Neck supple.  Cardiovascular: Normal rate, regular rhythm.  Pulmonary/chest: Effort normal and breath sounds normal. Some course BS bilateral bases Abdominal: Soft, nondistended, nontender.  There are no masses palpable. Extremities: no edema Lymphadenopathy: No cervical adenopathy noted. Neurological: Alert and oriented to person place and time. Skin: Skin is warm and dry. No rashes noted. Psychiatric: Normal mood and affect. Behavior is normal.   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 coli x2 and was treated.  Has since had persistent loose stools, colonoscopy negative without evidence of microscopic colitis.  He has failed a few regimens as outlined above but using Lomotil on a routine basis has provided benefit and he thinks in general over time he is improving.  We discussed other potential options.  He is a candidate for Viberzi and was able to get him a free sample for 1 week, dosed at 75 mg twice daily in light of his liver disease.  If he wishes to use this he should hold Lomotil for a few days  before hand.  He wishes to try the Viberzi to see if that provides any additional benefit, if not he will go back to using Lomotil as needed and hopefully titrate titrate down over time if he can.  He is significantly better than how he felt in prior months, hopefully this trend  continues.  We will refill Lomotil for him if he needs to resume it pending his course.  Otherwise we discussed his history of hepatitis C and imaging.  The last ultrasound does appear to reflect overt changes of cirrhosis.  He has compensated liver disease, we discussed risks for decompensation and HCC.  I am recommending another ultrasound at this time for [facility] screening as well as to check INR and AFP to make sure okay.  His LFTs are normal.  We discussed role of endoscopy to screen for esophageal varices, I offered this to him and following discussion of risks and benefits he wants to proceed.  Further recommendations pending the results.  He agreed with the plan  Plan: - trial of Viberzi 75mg  BID for one week, if this provides benefit, will place prescription - if Viberzi does not help can then stop it and resume lomotil and contact me - lab for AFP and INR - RUQ [place] for [facility] screening and will do so every 6 months - EGD for varices screening - follow up in 6 months or sooner with any issues  [place] Cellar, MD [facility]  CC: [name], MD

## 2020-11-29 NOTE — Patient Instructions (Addendum)
If you are age 74 or older, your body mass index should be between 23-30. Your Body mass index is 23.04 kg/m. If this is out of the aforementioned range listed, please consider follow up with your Primary Care Provider.  If you are age 68 or younger, your body mass index should be between 19-25. Your Body mass index is 23.04 kg/m. If this is out of the aformentioned range listed, please consider follow up with your Primary Care Provider.    We have given you a coupon card and prescription to take to your pharmacy: Viberzi: Take twice daily  If your pharmacy indicates you can not use the coupon please let us know and we will get some samples for you to try.    Please Hold lomotil for 2 days before starting Viberzi.    Once you try the Viberzi for 7 days please let us know if you want [place] to send a prescription for    Lomotil or the Viberzi.  You have been scheduled for an endoscopy. Please follow written instructions given to you at your visit today. If you use inhalers (even only as needed), please bring them with you on the day of your procedure.  Please go to the lab in the basement of our building to have lab work done as you leave today. Hit "B" for basement when you get on the elevator.  When the doors open the lab is on your left.  We will call you with the results. Thank you.  Due to recent changes in healthcare laws, you may see the results of your imaging and laboratory studies on MyChart before your provider has had a chance to review them.  We understand that in some cases there may be results that are confusing or concerning to you. Not all laboratory results come back in the same time frame and the provider may be waiting for multiple results in order to interpret others.  Please give [place] 48 hours in order for your provider to thoroughly review all the results before contacting the office for clarification of your results.   You will be contacted by [facility] in  the next 2 days to arrange a RUQ ultrasound.  The number on your caller ID will be [phone], please answer when they call.  If you have not heard from them in 2 days please call [phone] to schedule.      You will be due for a recall colonoscopy in 08-2025. We will send you a reminder in the mail when it gets closer to that time.   Thank you for entrusting me with your care and for choosing [facility], Dr. [place] Cellar

## 2020-11-29 NOTE — Progress Notes (Signed)
Patient due for RUQ ultrasound for cirrhosis - HCC screening

## 2020-11-30 LAB — AFP TUMOR MARKER: AFP-Tumor Marker: 8.5 ng/mL — ABNORMAL HIGH (ref <6.1)

## 2020-12-03 DIAGNOSIS — D225 Melanocytic nevi of trunk: Secondary | ICD-10-CM | POA: Diagnosis not present

## 2020-12-03 DIAGNOSIS — L821 Other seborrheic keratosis: Secondary | ICD-10-CM | POA: Diagnosis not present

## 2020-12-03 DIAGNOSIS — L570 Actinic keratosis: Secondary | ICD-10-CM | POA: Diagnosis not present

## 2020-12-03 DIAGNOSIS — L814 Other melanin hyperpigmentation: Secondary | ICD-10-CM | POA: Diagnosis not present

## 2020-12-04 DIAGNOSIS — M9903 Segmental and somatic dysfunction of lumbar region: Secondary | ICD-10-CM | POA: Diagnosis not present

## 2020-12-04 DIAGNOSIS — S39012A Strain of muscle, fascia and tendon of lower back, initial encounter: Secondary | ICD-10-CM | POA: Diagnosis not present

## 2020-12-04 DIAGNOSIS — M9901 Segmental and somatic dysfunction of cervical region: Secondary | ICD-10-CM | POA: Diagnosis not present

## 2020-12-04 DIAGNOSIS — M9902 Segmental and somatic dysfunction of thoracic region: Secondary | ICD-10-CM | POA: Diagnosis not present

## 2020-12-04 DIAGNOSIS — M47812 Spondylosis without myelopathy or radiculopathy, cervical region: Secondary | ICD-10-CM | POA: Diagnosis not present

## 2020-12-04 DIAGNOSIS — M47817 Spondylosis without myelopathy or radiculopathy, lumbosacral region: Secondary | ICD-10-CM | POA: Diagnosis not present

## 2020-12-05 ENCOUNTER — Ambulatory Visit (AMBULATORY_SURGERY_CENTER): Payer: PPO | Admitting: Gastroenterology

## 2020-12-05 ENCOUNTER — Other Ambulatory Visit: Payer: Self-pay

## 2020-12-05 ENCOUNTER — Encounter: Payer: Self-pay | Admitting: Gastroenterology

## 2020-12-05 VITALS — BP 113/65 | HR 57 | Temp 97.8°F | Resp 15 | Ht 71.0 in | Wt 165.0 lb

## 2020-12-05 DIAGNOSIS — K7469 Other cirrhosis of liver: Secondary | ICD-10-CM | POA: Diagnosis not present

## 2020-12-05 DIAGNOSIS — K31819 Angiodysplasia of stomach and duodenum without bleeding: Secondary | ICD-10-CM | POA: Diagnosis not present

## 2020-12-05 DIAGNOSIS — K7460 Unspecified cirrhosis of liver: Secondary | ICD-10-CM | POA: Diagnosis not present

## 2020-12-05 MED ORDER — SODIUM CHLORIDE 0.9 % IV SOLN: 500.0000 mL | Freq: Once | INTRAVENOUS | Status: DC

## 2020-12-05 NOTE — Op Note (Signed)
[facility] Patient Name: George Watson Procedure Date: 12/05/2020 11:40 AM MRN: [id number] Endoscopist: Remo Lipps P. Havery Moros , MD Age: 75 Referring MD:  Date of Birth: 1946/01/30 Gender: Male Account #: [id number] Procedure:                Upper GI endoscopy Indications:              history of compensated cirrhosis, history of Hep C                            - eradicated, screening for esophageal varices Medicines:                Monitored Anesthesia Care Procedure:                Pre-Anesthesia Assessment:                           - Prior to the procedure, a History and Physical                            was performed, and patient medications and                            allergies were reviewed. The patient's tolerance of                            previous anesthesia was also reviewed. The risks                            and benefits of the procedure and the sedation                            options and risks were discussed with the patient.                            All questions were answered, and informed consent                            was obtained. Prior Anticoagulants: The patient has                            taken no previous anticoagulant or antiplatelet                            agents. ASA Grade Assessment: III - A patient with                            severe systemic disease. After reviewing the risks                            and benefits, the patient was deemed in                            satisfactory condition to undergo the procedure.  After obtaining informed consent, the endoscope was                            passed under direct vision. Throughout the                            procedure, the patient's blood pressure, pulse, and                            oxygen saturations were monitored continuously. The                            Endoscope was introduced through the mouth, and                             advanced to the second part of duodenum. The upper                            GI endoscopy was accomplished without difficulty.                            The patient tolerated the procedure well. Scope In: Scope Out: Findings:                 Esophagogastric landmarks were identified: the                            Z-line was found at 41 cm, the gastroesophageal                            junction was found at 41 cm and the upper extent of                            the gastric folds was found at 41 cm from the                            incisors.                           The exam of the esophagus was otherwise normal. No                            varices.                           The entire examined stomach was normal. No varices.                           A single small angiodysplastic lesion was found in                            the second portion of the duodenum.                           The duodenal  bulb and second portion of the                            duodenum were otherwise normal. Complications:            No immediate complications. Estimated blood loss:                            None. Estimated Blood Loss:     Estimated blood loss: none. Impression:               - Esophagogastric landmarks identified.                           - Normal esophagus otherwise - no varices                           - Normal stomach - no varices                           - A single angiodysplastic lesion in the duodenum.                           - Duodenum was otherwise normal. Recommendation:           - Patient has a contact number available for                            emergencies. The signs and symptoms of potential                            delayed complications were discussed with the                            patient. Return to normal activities tomorrow.                            Written discharge instructions were provided to the                            patient.                            - Resume previous diet.                           - Continue present medications.                           - Repeat upper endoscopy in 3 years for screening                            purposes. Remo Lipps P. Myson Levi, MD 12/05/2020 11:57:28 AM This report has been signed electronically.

## 2020-12-05 NOTE — Progress Notes (Signed)
C.W. vital signs. 

## 2020-12-05 NOTE — Progress Notes (Signed)
To PACU, VSS. Report to [email or web address] 

## 2020-12-05 NOTE — Progress Notes (Signed)
Pt's states no medical or surgical changes since previsit or office visit. 

## 2020-12-05 NOTE — Patient Instructions (Signed)
Discharge instructions given. ?Normal exam. ?Resume previous medications. ?YOU HAD AN ENDOSCOPIC PROCEDURE TODAY AT THE [facility]:   Refer to the procedure report that was given to you for any specific questions about what was found during the examination.  If the procedure report does not answer your questions, please call your gastroenterologist to clarify.  If you requested that your care partner not be given the details of your procedure findings, then the procedure report has been included in a sealed envelope for you to review at your convenience later. ? ?YOU SHOULD EXPECT: Some feelings of bloating in the abdomen. Passage of more gas than usual.  Walking can help get rid of the air that was put into your GI tract during the procedure and reduce the bloating. If you had a lower endoscopy (such as a colonoscopy or flexible sigmoidoscopy) you may notice spotting of blood in your stool or on the toilet paper. If you underwent a bowel prep for your procedure, you may not have a normal bowel movement for a few days. ? ?Please Note:  You might notice some irritation and congestion in your nose or some drainage.  This is from the oxygen used during your procedure.  There is no need for concern and it should clear up in a day or so. ? ?SYMPTOMS TO REPORT IMMEDIATELY: ? ? ?Following upper endoscopy (EGD) ? Vomiting of blood or coffee ground material ? New chest pain or pain under the shoulder blades ? Painful or persistently difficult swallowing ? New shortness of breath ? Fever of 100?F or higher ? Black, tarry-looking stools ? ?For urgent or emergent issues, a gastroenterologist can be reached at any hour by calling [phone]. ?Do not use MyChart messaging for urgent concerns.  ? ? ?DIET:  We do recommend a small meal at first, but then you may proceed to your regular diet.  Drink plenty of fluids but you should avoid alcoholic beverages for 24 hours. ? ?ACTIVITY:  You should plan to take it easy  for the rest of today and you should NOT DRIVE or use heavy machinery until tomorrow (because of the sedation medicines used during the test).   ? ?FOLLOW UP: ?Our staff will call the number listed on your records 48-72 hours following your procedure to check on you and address any questions or concerns that you may have regarding the information given to you following your procedure. If we do not reach you, we will leave a message.  We will attempt to reach you two times.  During this call, we will ask if you have developed any symptoms of COVID 19. If you develop any symptoms (ie: fever, flu-like symptoms, shortness of breath, cough etc.) before then, please call [phone].  If you test positive for Covid 19 in the 2 weeks post procedure, please call and report this information to us.   ? ?If any biopsies were taken you will be contacted by phone or by letter within the next 1-3 weeks.  Please call us at [phone] if you have not heard about the biopsies in 3 weeks.  ? ? ?SIGNATURES/CONFIDENTIALITY: ?You and/or your care partner have signed paperwork which will be entered into your electronic medical record.  These signatures attest to the fact that that the information above on your After Visit Summary has been reviewed and is understood.  Full responsibility of the confidentiality of this discharge information lies with you and/or your care-partner.  ?

## 2020-12-07 ENCOUNTER — Telehealth: Payer: Self-pay | Admitting: *Deleted

## 2020-12-07 NOTE — Telephone Encounter (Signed)
  Follow up Call-  Call back number 12/05/2020  Post procedure Call Back phone  # [phone]  Permission to leave phone message Yes  Some recent data might be hidden     Patient questions:  Do you have a fever, pain , or abdominal swelling? No. Pain Score  0 *  Have you tolerated food without any problems? Yes.    Have you been able to return to your normal activities? Yes.    Do you have any questions about your discharge instructions: Diet   No. Medications  No. Follow up visit  No.  Do you have questions or concerns about your Care? No.  Actions: * If pain score is 4 or above: No action needed, pain <4.  1. Have you developed a fever since your procedure? no  2.   Have you had an respiratory symptoms (SOB or cough) since your procedure? no  3.   Have you tested positive for COVID 19 since your procedure no  4.   Have you had any family members/close contacts diagnosed with the COVID 19 since your procedure?  no   If yes to any of these questions please route to Joylene John, RN and Joella Prince, RN

## 2020-12-11 ENCOUNTER — Ambulatory Visit (HOSPITAL_BASED_OUTPATIENT_CLINIC_OR_DEPARTMENT_OTHER)
Admission: RE | Admit: 2020-12-11 | Discharge: 2020-12-11 | Disposition: A | Discharge status: Home / Self Care | Payer: PPO | Source: Ambulatory Visit | Attending: Gastroenterology | Admitting: Gastroenterology

## 2020-12-11 ENCOUNTER — Other Ambulatory Visit: Payer: Self-pay

## 2020-12-11 DIAGNOSIS — K7460 Unspecified cirrhosis of liver: Secondary | ICD-10-CM | POA: Diagnosis not present

## 2020-12-17 ENCOUNTER — Encounter (HOSPITAL_COMMUNITY): Payer: Self-pay

## 2020-12-17 ENCOUNTER — Other Ambulatory Visit: Payer: Self-pay

## 2020-12-17 ENCOUNTER — Ambulatory Visit (HOSPITAL_COMMUNITY)
Admission: EM | Admit: 2020-12-17 | Discharge: 2020-12-17 | Disposition: A | Discharge status: Home / Self Care | Payer: PPO | Attending: Emergency Medicine | Admitting: Emergency Medicine

## 2020-12-17 DIAGNOSIS — J441 Chronic obstructive pulmonary disease with (acute) exacerbation: Secondary | ICD-10-CM | POA: Diagnosis not present

## 2020-12-17 DIAGNOSIS — Z20822 Contact with and (suspected) exposure to covid-19: Secondary | ICD-10-CM | POA: Insufficient documentation

## 2020-12-17 DIAGNOSIS — R0602 Shortness of breath: Secondary | ICD-10-CM | POA: Insufficient documentation

## 2020-12-17 MED ORDER — PREDNISONE 10 MG (21) PO TBPK: ORAL_TABLET | Freq: Every day | ORAL | 0 refills | Status: AC

## 2020-12-17 NOTE — ED Provider Notes (Signed)
[place]  ____________________________________________  Time seen: Approximately 5:37 PM  I have reviewed the triage vital signs and the nursing notes.   HISTORY  Chief Complaint Shortness of Breath and Dizziness   Historian Patient    HPI George Watson is a 75 y.o. male presents to the urgent care with mild shortness of breath and some dizziness.  Patient reports that he has a history of COPD.  Patient states that he is experienced these exact symptoms in the past when he has a mild COPD exacerbation.  He states that he needs a tapered steroid and that his symptoms historically resolve.  He denies chest pain, chest tightness or pleuritic chest discomfort.  No back pain.  Patient denies cough, fever, nasal congestion, vomiting or diarrhea at home.   Past Medical History:  Diagnosis Date  . Aortic aneurysm ([place])   . Arthritis   . BPH (benign prostatic hypertrophy)   . COPD (chronic obstructive pulmonary disease) ([place])   . DDD (degenerative disc disease), lumbosacral   . Elevated PSA   . Emphysema of lung ([place])   . Essential hypertension, benign    diet controlled  . Fracture of shaft of clavicle   . GERD (gastroesophageal reflux disease)   . H/O drug abuse ([place])    multisubstance  . Hepatitis C 10/1996  . HSV-2 (herpes simplex virus 2) infection   . Hx of biopsy 08/2004   liver  . Hx of colonic polyps 2022  . Hyperlipidemia   . Inguinal hernia    right  . Other and unspecified hyperlipidemia   . Prostate cancer ([place])   . Substance abuse ([place])   . Tuberculosis      Immunizations up to date:  Yes.     Past Medical History:  Diagnosis Date  . Aortic aneurysm ([place])   . Arthritis   . BPH (benign prostatic hypertrophy)   . COPD (chronic obstructive pulmonary disease) ([place])   . DDD (degenerative disc disease), lumbosacral   . Elevated PSA   . Emphysema of lung ([place])   . Essential hypertension, benign    diet controlled  . Fracture of shaft of  clavicle   . GERD (gastroesophageal reflux disease)   . H/O drug abuse ([place])    multisubstance  . Hepatitis C 10/1996  . HSV-2 (herpes simplex virus 2) infection   . Hx of biopsy 08/2004   liver  . Hx of colonic polyps 2022  . Hyperlipidemia   . Inguinal hernia    right  . Other and unspecified hyperlipidemia   . Prostate cancer ([place])   . Substance abuse ([place])   . Tuberculosis     Patient Active Problem List   Diagnosis Date Noted  . Lung nodule 10/05/2020  . Gastroenteritis 05/08/2020  . TIA (transient ischemic attack) 07/07/2019  . Dilated aortic root ([place]) 06/13/2019  . Pure hypertriglyceridemia 04/08/2019  . Fear of flying 02/11/2018  . Tear of right rotator cuff 08/31/2017  . Prediabetes 01/06/2017  . Atherosclerosis of aorta ([place]) 07/09/2016  . Degeneration of lumbar or lumbosacral intervertebral disc 02/15/2014  . Essential hypertension, benign   . Hyperlipidemia with target LDL less than 70   . HSV-2 (herpes simplex virus 2) infection   . COPD (chronic obstructive pulmonary disease) ([place])   . Benign prostatic hyperplasia   . DDD (degenerative disc disease), lumbosacral   . Prostate cancer ([place])   . Hepatitis C, chronic ([place]) 10/19/1996    Past Surgical History:  Procedure  Laterality Date  . CERVICAL SPINE SURGERY    . fracture ribs     3  . HERNIA REPAIR    . INGUINAL HERNIA REPAIR  07/01/2012   Procedure: HERNIA REPAIR INGUINAL ADULT;  Surgeon: Madilyn Hook, DO;  Location: [facility];  Service: General;  Laterality: Right;  with Mesh  . left knee meniscus repair  1992  . right rotator cuff  1992  . right shoulder arthroscopy  2011    Prior to Admission medications   Medication Sig Start Date End Date Taking? Authorizing Provider  predniSONE (STERAPRED UNI-PAK 21 TAB) 10 MG (21) TBPK tablet Take by mouth daily for 6 days. 6,5,4,3,2,1 12/17/20 12/23/20 Yes Vallarie Mare M, PA-C  albuterol (VENTOLIN HFA) 108 (90 Base) MCG/ACT inhaler INHALE 2 PUFFS INTO THE  LUNGS EVERY 6 (SIX) HOURS AS NEEDED FOR SHORTNESS OF BREATH. 04/06/19   Janith Lima, MD  aspirin EC 81 MG EC tablet Take 1 tablet (81 mg total) by mouth daily. 07/09/19   Black, Lezlie Octave, NP  B Complex Vitamins (B COMPLEX PO) Take 1 tablet by mouth daily.    [provider]  cholecalciferol (VITAMIN D3) 25 MCG (1000 UT) tablet Take 1,000 Units by mouth daily.    [provider]  Coenzyme Q10-Vitamin E 100-300 MG-UNIT CHEW Chew 1 tablet by mouth daily.    [provider]  diazepam (VALIUM) 2 MG tablet Take 1-2 tabs 30 minutes before flight. 11/05/20   [name], MD  diphenoxylate-atropine (LOMOTIL) 2.5-0.025 MG tablet Take by mouth See admin instructions. 2 tablets at the first sign of loose stool, then 1 tablet after each loose stool. Not to exceed 8 tabs per day    [provider]  dutasteride (AVODART) 0.5 MG capsule Take 0.5 mg by mouth daily.    [provider]  Eluxadoline (VIBERZI) 75 MG TABS Take 75 mg by mouth in the morning and at bedtime. 11/29/20   Armbruster, Carlota Raspberry, MD  famotidine (PEPCID) 20 MG tablet Take 20 mg by mouth every evening.     [provider]  glucosamine-chondroitin 500-400 MG tablet Take 3 tablets by mouth daily. Patient taking differently: Take 2 tablets by mouth daily. 02/15/18   Marrian Salvage, FNP  hydrochlorothiazide (MICROZIDE) 12.5 MG capsule Take 1 capsule (12.5 mg total) by mouth daily. 11/23/20   [name], MD  losartan (COZAAR) 100 MG tablet TAKE 1 TABLET BY MOUTH EVERY DAY 06/21/20   [name], MD  metaxalone (SKELAXIN) 800 MG tablet Take 1 tablet (800 mg total) by mouth 3 (three) times daily as needed for muscle spasms. 11/05/20   [name], MD  Multiple Vitamins-Minerals (CENTRUM SILVER PO) Take 1 tablet by mouth daily.    [provider]  mupirocin cream (BACTROBAN) 2 % Apply 1 application topically 2 (two) times daily. 04/15/20   [name], MD  Omega-3 Fatty Acids  (FISH OIL PO) Take 1 tablet by mouth daily.    [provider]  rosuvastatin (CRESTOR) 5 MG tablet Take 1 tablet (5 mg total) by mouth daily. 04/12/20   [name], MD  tamsulosin (FLOMAX) 0.4 MG CAPS capsule Take 0.4 mg by mouth daily.     [provider]  TRELEGY ELLIPTA 100-62.5-25 MCG/INH AEPB TAKE 1 PUFF BY MOUTH EVERY DAY 06/27/20   Collene Gobble, MD  TURMERIC CURCUMIN PO Take 1 tablet by mouth daily.    [provider]  olmesartan (BENICAR) 40 MG tablet TAKE 1 TABLET BY  MOUTH EVERY DAY 11/14/19 02/04/20  [name], MD    Allergies Amlodipine  Family History  Problem Relation Age of Onset  . Breast cancer Sister   . Leukemia Mother   . Throat cancer Father   . Esophageal cancer Father   . Prostate cancer Brother   . Melanoma Brother   . Colon cancer Neg Hx   . Stomach cancer Neg Hx   . Pancreatic cancer Neg Hx   . Liver disease Neg Hx     Social History Social History   Tobacco Use  . Smoking status: Former Smoker    Years: 43.00    Types: Cigarettes    Quit date: 07/21/2002    Years since quitting: 18.4  . Smokeless tobacco: Never Used  Vaping Use  . Vaping Use: Never used  Substance Use Topics  . Alcohol use: No  . Drug use: No    Comment: 26 years ago cocaine, heroin. methadone     Review of Systems  Constitutional: No fever/chills Eyes:  No discharge ENT: No upper respiratory complaints. Respiratory: Patient has shortness of breath. No SOB/ use of accessory muscles to breath Gastrointestinal:   No nausea, no vomiting.  No diarrhea.  No constipation. Musculoskeletal: Negative for musculoskeletal pain. Skin: Negative for rash, abrasions, lacerations, ecchymosis.    ____________________________________________   PHYSICAL EXAM:  VITAL SIGNS: ED Triage Vitals  Enc Vitals Group     BP 12/17/20 1636 105/71     Pulse Rate 12/17/20 1636 95     Resp 12/17/20 1636 18     Temp 12/17/20 1636 (!) 97.5 F (36.4 C)     Temp  Source 12/17/20 1636 Oral     SpO2 12/17/20 1636 98 %     Weight --      Height --      Head Circumference --      Peak Flow --      Pain Score 12/17/20 1638 0     Pain Loc --      Pain Edu? --      Excl. in [place]? --      Constitutional: Alert and oriented. Well appearing and in no acute distress. Eyes: Conjunctivae are normal. PERRL. EOMI. Head: Atraumatic. ENT:      Nose: No congestion/rhinnorhea.      Mouth/Throat: Mucous membranes are moist.  Neck: No stridor.  No cervical spine tenderness to palpation. Cardiovascular: Normal rate, regular rhythm. Normal S1 and S2.  Good peripheral circulation. Respiratory: Normal respiratory effort without tachypnea or retractions.  Patient has faint expiratory wheezing bilaterally. Good air entry to the bases with no decreased or absent breath sounds Gastrointestinal: Bowel sounds x 4 quadrants. Soft and nontender to palpation. No guarding or rigidity. No distention. Musculoskeletal: Full range of motion to all extremities. No obvious deformities noted Neurologic:  Normal for age. No gross focal neurologic deficits are appreciated.  Skin:  Skin is warm, dry and intact. No rash noted. Psychiatric: Mood and affect are normal for age. Speech and behavior are normal.   ____________________________________________   LABS (all labs ordered are listed, but only abnormal results are displayed)  Labs Reviewed  SARS CORONAVIRUS 2 (TAT 6-24 HRS)   ____________________________________________  EKG   ____________________________________________  RADIOLOGY   No results found.  ____________________________________________    PROCEDURES  Procedure(s) performed:     Procedures     Medications - No data to display   ____________________________________________   INITIAL IMPRESSION / ASSESSMENT AND PLAN / ED  COURSE  Pertinent labs & imaging results that were available during my care of the patient were reviewed by me and  considered in my medical decision making (see chart for details).    Assessment and plan:  Shortness of breath 75 year old male presents to the urgent care with mild shortness of breath that started today consistent with prior COPD exacerbations in the past.    Vital signs are reassuring at triage.  On physical exam, patient was alert, active and nontoxic-appearing.  Patient had a faint expiratory wheeze on exam without other increased work of breathing.  Patient was prescribed tapered prednisone as requested.  Patient states that he has good access to an emergency department if his symptoms were to change or worsen.  Send out COVID-19 test is in process at this time.  All patient questions were answered prior to discharge.     ____________________________________________  FINAL CLINICAL IMPRESSION(S) / ED DIAGNOSES  Final diagnoses:  COPD exacerbation ([place])      NEW MEDICATIONS STARTED DURING THIS VISIT:  ED Discharge Orders         Ordered    predniSONE (STERAPRED UNI-PAK 21 TAB) 10 MG (21) TBPK tablet  Daily        12/17/20 1709              This chart was dictated using voice recognition software/Dragon. Despite best efforts to proofread, errors can occur which can change the meaning. Any change was purely unintentional.     Lannie Fields, PA-C 12/17/20 1742

## 2020-12-17 NOTE — ED Triage Notes (Signed)
Pt with shortness of breath and dizziness since waking up this morning; Pt has COPD.

## 2020-12-17 NOTE — Discharge Instructions (Addendum)
If shortness of breath does not improve, please seek care at emergency department.

## 2020-12-18 LAB — SARS CORONAVIRUS 2 (TAT 6-24 HRS): SARS Coronavirus 2: NEGATIVE

## 2020-12-19 ENCOUNTER — Other Ambulatory Visit: Payer: Self-pay | Admitting: Emergency Medicine

## 2020-12-24 DIAGNOSIS — C61 Malignant neoplasm of prostate: Secondary | ICD-10-CM | POA: Diagnosis not present

## 2020-12-27 ENCOUNTER — Encounter: Payer: PPO | Admitting: Family Medicine

## 2020-12-31 DIAGNOSIS — N503 Cyst of epididymis: Secondary | ICD-10-CM | POA: Diagnosis not present

## 2020-12-31 DIAGNOSIS — Z8546 Personal history of malignant neoplasm of prostate: Secondary | ICD-10-CM | POA: Diagnosis not present

## 2021-01-01 DIAGNOSIS — M47812 Spondylosis without myelopathy or radiculopathy, cervical region: Secondary | ICD-10-CM | POA: Diagnosis not present

## 2021-01-01 DIAGNOSIS — M47817 Spondylosis without myelopathy or radiculopathy, lumbosacral region: Secondary | ICD-10-CM | POA: Diagnosis not present

## 2021-01-01 DIAGNOSIS — S39012A Strain of muscle, fascia and tendon of lower back, initial encounter: Secondary | ICD-10-CM | POA: Diagnosis not present

## 2021-01-01 DIAGNOSIS — M9903 Segmental and somatic dysfunction of lumbar region: Secondary | ICD-10-CM | POA: Diagnosis not present

## 2021-01-01 DIAGNOSIS — M9901 Segmental and somatic dysfunction of cervical region: Secondary | ICD-10-CM | POA: Diagnosis not present

## 2021-01-01 DIAGNOSIS — M9902 Segmental and somatic dysfunction of thoracic region: Secondary | ICD-10-CM | POA: Diagnosis not present

## 2021-01-03 ENCOUNTER — Ambulatory Visit (HOSPITAL_BASED_OUTPATIENT_CLINIC_OR_DEPARTMENT_OTHER): Payer: PPO | Admitting: Family Medicine

## 2021-01-04 ENCOUNTER — Other Ambulatory Visit: Payer: Self-pay

## 2021-01-04 ENCOUNTER — Ambulatory Visit (INDEPENDENT_AMBULATORY_CARE_PROVIDER_SITE_OTHER): Payer: PPO | Admitting: Family Medicine

## 2021-01-04 ENCOUNTER — Encounter (HOSPITAL_BASED_OUTPATIENT_CLINIC_OR_DEPARTMENT_OTHER): Payer: Self-pay | Admitting: Family Medicine

## 2021-01-04 VITALS — BP 124/78 | HR 74 | Ht 71.0 in | Wt 163.9 lb

## 2021-01-04 DIAGNOSIS — I10 Essential (primary) hypertension: Secondary | ICD-10-CM

## 2021-01-04 DIAGNOSIS — R7303 Prediabetes: Secondary | ICD-10-CM

## 2021-01-04 DIAGNOSIS — M75101 Unspecified rotator cuff tear or rupture of right shoulder, not specified as traumatic: Secondary | ICD-10-CM | POA: Diagnosis not present

## 2021-01-04 DIAGNOSIS — E785 Hyperlipidemia, unspecified: Secondary | ICD-10-CM

## 2021-01-04 NOTE — Assessment & Plan Note (Signed)
At goal in office today Continue with current regimen of losartan and hydrochlorothiazide Advised on home monitoring and goal blood pressure If remaining persistently elevated, advised to contact the clinic for sooner follow-up

## 2021-01-04 NOTE — Assessment & Plan Note (Signed)
Managed on rosuvastatin, tolerating well Most recent labs with LDL at 70, HDL 34 Likely repeat lipid panel at 1 year which will be in about 9 months

## 2021-01-04 NOTE — Assessment & Plan Note (Signed)
Most recent A1c 5.8% Managed with lifestyle modifications, continue with this Last A1c was about 3 months ago, expect to monitor about every 6 to 12 months

## 2021-01-04 NOTE — Patient Instructions (Signed)
  Medication Instructions:  Your physician recommends that you continue on your current medications as directed. Please refer to the Current Medication list given to you today. --If you need a refill on any your medications before your next appointment, please call your pharmacy first. If no refills are authorized on file call the office.--  Lab Work: Your physician has recommended that you have lab work today:  If you have labs (blood work) drawn today and your tests are completely normal, you will receive your results via [place] a phone call from our staff.  Please ensure you check your voicemail in the event that you authorized detailed messages to be left on a delegated number. If you have any lab test that is abnormal or we need to change your treatment, we will call you to review the results.  Referrals/Procedures/Imaging: A referral has been placed for you to Dr. Tamera Punt for evaluation and treatment. Someone from the scheduling department will be in contact with you in regards to coordinating your consultation. If you do not hear from any of the schedulers within 7-10 business days please give our office a call.  Follow-Up: Your next appointment:   Your physician recommends that you schedule a follow-up appointment in: 6 MONTHS with Dr. de [place]  Thanks for letting us be apart of your health journey!!  Primary Care and Sports Medicine   Dr. Arlina Robes [place]   We encourage you to activate your patient portal called "MyChart".  Sign up information is provided on this After Visit Summary.  MyChart is used to connect with patients for Virtual Visits (Telemedicine).  Patients are able to view lab/test results, encounter notes, upcoming appointments, etc.  Non-urgent messages can be sent to your provider as well. To learn more about what you can do with MyChart, please visit --  [email or web address].

## 2021-01-04 NOTE — Assessment & Plan Note (Signed)
Reports prior evaluation and imaging which indicated rotator cuff tear Does have decreased strength as compared to contralateral side Discussed treatment options, specifically recommendation for conservative treatment for rotator cuff tears, however patient does desire evaluation with orthopedic surgeon to discuss possible surgical options Will refer to patient's preferred specialist

## 2021-01-04 NOTE — Progress Notes (Addendum)
New Patient Office Visit  Subjective:  Patient ID: George Watson, male    DOB: June 01, 1946  Age: 75 y.o. MRN: [id number]  CC:  Chief Complaint  Patient presents with   Establish Care    Former PCP George Watson   Rotator Cuff    Patient has a hx of torn right rotator cuff. Patient states he has had 2 previous surgies and has been told there is nothing else he can do but would like a second opinion    HPI George Watson is a 75 yo male presenting to establish in clinic.  He has current concerns today related to right rotator cuff tear.  See below for past medical history.  Reports that PCP was primarily managing hypertension, hyperlipidemia, prediabetes.  Right rotator cuff tear: Reports that he was told he has a torn rotator cuff - had evaluation with orthopedic surgeon in 2014 when he was told he had a torn rotator cuff but that surgery was unlikely to be helpful. Has had prior surgeries on the right shoulder in 1992, 2011. No specific treatments otherwise since 2014. Primary issues are decreased strength, some loss of ROM.  Prediabetes: Managed with lifestyle modifications. Last measure was 5.8% about 3 months ago.  HLD: Takes rosuvastatin. Denies any myalgias, tolerating well.  HTN: does have history of TIA. Managed on losartan and HCTZ. Did not tolerate amlodipine in the past. Does not check home BP; denies any CP, HA, no worsening SOB.  Prostate Ca: follows with urology COPD: follows with pulmonology Liver disease: follows with GI, recently established with new physician  Past Medical History:  Diagnosis Date   Aortic aneurysm ([place])    Arthritis    BPH (benign prostatic hypertrophy)    COPD (chronic obstructive pulmonary disease) ([place])    DDD (degenerative disc disease), lumbosacral    Elevated PSA    Emphysema of lung ([place])    Essential hypertension, benign    diet controlled   Fracture of shaft of clavicle    GERD (gastroesophageal reflux disease)    H/O drug abuse  ([place])    multisubstance   Hepatitis C 10/1996   HSV-2 (herpes simplex virus 2) infection    Hx of biopsy 08/2004   liver   Hx of colonic polyps 2022   Hyperlipidemia    Inguinal hernia    right   Other and unspecified hyperlipidemia    Prostate cancer ([place])    Substance abuse ([place])    Tuberculosis     Past Surgical History:  Procedure Laterality Date   CERVICAL SPINE SURGERY     fracture ribs     3   HERNIA REPAIR     INGUINAL HERNIA REPAIR  07/01/2012   Procedure: HERNIA REPAIR INGUINAL ADULT;  Surgeon: Madilyn Hook, DO;  Location: [facility];  Service: General;  Laterality: Right;  with Mesh   left knee meniscus repair  1992   right rotator cuff  1992   right shoulder arthroscopy  2011    Family History  Problem Relation Age of Onset   Breast cancer Sister    Leukemia Mother    Throat cancer Father    Esophageal cancer Father    Prostate cancer Brother    Melanoma Brother    Colon cancer Neg Hx    Stomach cancer Neg Hx    Pancreatic cancer Neg Hx    Liver disease Neg Hx     Social History   Socioeconomic History   Marital status:  Married    Spouse name: Not on file   Number of children: Not on file   Years of education: Not on file   Highest education level: Not on file  Occupational History   Occupation: Geophysicist/field seismologist   Occupation: Retired     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 methadone   Sexual activity: Yes    Comment: number of sex partners in the last 12 months  1  Other Topics Concern   Not on file  Social History Narrative      Married to George Watson. Exercise cardio daily for 30 minutes. Education: [employer].   Social  Determinants of Health   Financial Resource Strain: Low Risk    Difficulty of Paying Living Expenses: Not hard at all  Food Insecurity: No Food Insecurity   Worried About Charity fundraiser in the Last Year: Never true   [place] in the Last Year: Never true  Transportation Needs: No Transportation Needs   Lack of Transportation (Medical): No   Lack of Transportation (Non-Medical): No  Physical Activity: Sufficiently Active   Days of Exercise per Week: 7 days   Minutes of Exercise per Session: 60 min  Stress: Not on file  Social Connections: Moderately Integrated   Frequency of Communication with Friends and Family: More than three times a week   Frequency of Social Gatherings with Friends and Family: Three times a week   Attends Religious Services: Never   Active Member of Clubs or Organizations: Yes   Attends Archivist Meetings: 1 to 4 times per year   Marital Status: Married  Human resources officer Violence: Not At Risk   Fear of Current or Ex-Partner: No   Emotionally Abused: No   Physically Abused: No   Sexually Abused: No    Objective:   Today's Vitals: BP 124/78   Pulse 74   Ht 5\' 11"  (1.803 m)   Wt 163 lb 14.4 oz (74.3 kg)   SpO2 97%   BMI 22.86 kg/m   Physical Exam  75 yo male in no acute distress Cardiovascular exam with regular rate and rhythm, no murmurs appreciated Lungs with faint expiratory wheezes diffusely Right shoulder: Obvious swelling, bruising or erythema: absent Deformity of the shoulder: absent Active ROM: diminished range of motion Passive ROM: Slight restriction with overhead and abduction, improved compared to active ROM Strength: abduction 4/5, external rotation with shoulder at side 4/5, flexion 4/5 Empty can: Weakness compared to contralateral side, no pain elicited Hawkins: Negative Neer's: Negative Neurovascular exam: intact   Assessment & Plan:   Problem List Items Addressed This Visit       Cardiovascular and  Mediastinum   Essential hypertension, benign - Primary    At goal in office today Continue with current regimen of losartan and hydrochlorothiazide Advised on home monitoring and goal blood pressure If remaining persistently elevated, advised to contact the clinic for sooner follow-up         Musculoskeletal and Integument   Tear of right rotator cuff  Reports prior evaluation and imaging which indicated rotator cuff tear Does have decreased strength as compared to contralateral side Discussed treatment options, specifically recommendation for conservative treatment for rotator cuff tears, however patient does desire evaluation with orthopedic surgeon to discuss possible surgical options Will refer to patient's preferred specialist         Other   Hyperlipidemia with target LDL less than 70    Managed on rosuvastatin, tolerating well Most recent labs with LDL at 70, HDL 34 Likely repeat lipid panel at 1 year which will be in about 9 months       Prediabetes    Most recent A1c 5.8% Managed with lifestyle modifications, continue with this Last A1c was about 3 months ago, expect to monitor about every 6 to 12 months        Outpatient Encounter Medications as of 01/04/2021  Medication Sig   albuterol (VENTOLIN HFA) 108 (90 Base) MCG/ACT inhaler INHALE 2 PUFFS INTO THE LUNGS EVERY 6 (SIX) HOURS AS NEEDED FOR SHORTNESS OF BREATH.   aspirin EC 81 MG EC tablet Take 1 tablet (81 mg total) by mouth daily.   B Complex Vitamins (B COMPLEX PO) Take 1 tablet by mouth daily.   cholecalciferol (VITAMIN D3) 25 MCG (1000 UT) tablet Take 1,000 Units by mouth daily.   Coenzyme Q10-Vitamin E 100-300 MG-UNIT CHEW Chew 1 tablet by mouth daily.   diazepam (VALIUM) 2 MG tablet Take 1-2 tabs 30 minutes before flight.   diphenoxylate-atropine (LOMOTIL) 2.5-0.025 MG tablet Take by mouth See admin instructions. 2 tablets at the first sign of loose stool, then 1 tablet after each loose stool. Not to  exceed 8 tabs per day   dutasteride (AVODART) 0.5 MG capsule Take 0.5 mg by mouth daily.   famotidine (PEPCID) 20 MG tablet Take 20 mg by mouth every evening.    glucosamine-chondroitin 500-400 MG tablet Take 3 tablets by mouth daily. (Patient taking differently: Take 2 tablets by mouth daily.)   hydrochlorothiazide (MICROZIDE) 12.5 MG capsule Take 1 capsule (12.5 mg total) by mouth daily.   losartan (COZAAR) 100 MG tablet TAKE 1 TABLET BY MOUTH EVERY DAY   metaxalone (SKELAXIN) 800 MG tablet Take 1 tablet (800 mg total) by mouth 3 (three) times daily as needed for muscle spasms.   Multiple Vitamins-Minerals (CENTRUM SILVER PO) Take 1 tablet by mouth daily.   mupirocin cream (BACTROBAN) 2 % Apply 1 application topically 2 (two) times daily.   Omega-3 Fatty Acids (FISH OIL PO) Take 1 tablet by mouth daily.   rosuvastatin (CRESTOR) 5 MG tablet Take 1 tablet (5 mg total) by mouth daily.   tamsulosin (FLOMAX) 0.4 MG CAPS capsule Take 0.4 mg by mouth daily.    TRELEGY ELLIPTA 100-62.5-25 MCG/INH AEPB TAKE 1 PUFF BY MOUTH EVERY DAY   TURMERIC CURCUMIN PO Take 1 tablet by mouth daily.   [DISCONTINUED] Eluxadoline (VIBERZI) 75 MG TABS Take 75 mg by mouth in the morning and at bedtime.   [DISCONTINUED] olmesartan (BENICAR) 40 MG tablet TAKE 1 TABLET BY MOUTH EVERY DAY   No facility-administered encounter medications on file as of 01/04/2021.   Spent 50 minutes on this patient encounter, including preparation, chart review, face-to-face counseling with patient and coordination of care, and documentation of encounter  Follow-up: No follow-ups on file.   Keilen Kahl J [place], MD

## 2021-01-05 ENCOUNTER — Other Ambulatory Visit: Payer: Self-pay | Admitting: Family Medicine

## 2021-01-09 ENCOUNTER — Other Ambulatory Visit: Payer: Self-pay | Admitting: Family Medicine

## 2021-01-09 DIAGNOSIS — I10 Essential (primary) hypertension: Secondary | ICD-10-CM

## 2021-01-10 ENCOUNTER — Other Ambulatory Visit: Payer: PPO

## 2021-01-10 DIAGNOSIS — K580 Irritable bowel syndrome with diarrhea: Secondary | ICD-10-CM

## 2021-01-10 DIAGNOSIS — R197 Diarrhea, unspecified: Secondary | ICD-10-CM | POA: Diagnosis not present

## 2021-01-10 NOTE — Telephone Encounter (Signed)
Lab order in epic. Pt notified via my chart.

## 2021-01-13 LAB — GI PROFILE, STOOL, PCR

## 2021-01-14 ENCOUNTER — Other Ambulatory Visit: Payer: Self-pay | Admitting: Gastroenterology

## 2021-01-14 DIAGNOSIS — A0472 Enterocolitis due to Clostridium difficile, not specified as recurrent: Secondary | ICD-10-CM

## 2021-01-14 MED ORDER — VANCOMYCIN HCL 125 MG PO CAPS: 125.0000 mg | ORAL_CAPSULE | Freq: Four times a day (QID) | ORAL | 0 refills | Status: AC

## 2021-01-17 DIAGNOSIS — M47812 Spondylosis without myelopathy or radiculopathy, cervical region: Secondary | ICD-10-CM | POA: Diagnosis not present

## 2021-01-17 DIAGNOSIS — M9902 Segmental and somatic dysfunction of thoracic region: Secondary | ICD-10-CM | POA: Diagnosis not present

## 2021-01-17 DIAGNOSIS — S39012A Strain of muscle, fascia and tendon of lower back, initial encounter: Secondary | ICD-10-CM | POA: Diagnosis not present

## 2021-01-17 DIAGNOSIS — M9903 Segmental and somatic dysfunction of lumbar region: Secondary | ICD-10-CM | POA: Diagnosis not present

## 2021-01-17 DIAGNOSIS — M47817 Spondylosis without myelopathy or radiculopathy, lumbosacral region: Secondary | ICD-10-CM | POA: Diagnosis not present

## 2021-01-17 DIAGNOSIS — M9901 Segmental and somatic dysfunction of cervical region: Secondary | ICD-10-CM | POA: Diagnosis not present

## 2021-01-23 DIAGNOSIS — M19211 Secondary osteoarthritis, right shoulder: Secondary | ICD-10-CM | POA: Diagnosis not present

## 2021-01-24 DIAGNOSIS — M47812 Spondylosis without myelopathy or radiculopathy, cervical region: Secondary | ICD-10-CM | POA: Diagnosis not present

## 2021-01-24 DIAGNOSIS — M47817 Spondylosis without myelopathy or radiculopathy, lumbosacral region: Secondary | ICD-10-CM | POA: Diagnosis not present

## 2021-01-24 DIAGNOSIS — S39012A Strain of muscle, fascia and tendon of lower back, initial encounter: Secondary | ICD-10-CM | POA: Diagnosis not present

## 2021-01-24 DIAGNOSIS — M9903 Segmental and somatic dysfunction of lumbar region: Secondary | ICD-10-CM | POA: Diagnosis not present

## 2021-01-24 DIAGNOSIS — M9901 Segmental and somatic dysfunction of cervical region: Secondary | ICD-10-CM | POA: Diagnosis not present

## 2021-01-24 DIAGNOSIS — M9902 Segmental and somatic dysfunction of thoracic region: Secondary | ICD-10-CM | POA: Diagnosis not present

## 2021-01-31 DIAGNOSIS — M75121 Complete rotator cuff tear or rupture of right shoulder, not specified as traumatic: Secondary | ICD-10-CM | POA: Diagnosis not present

## 2021-01-31 DIAGNOSIS — M19011 Primary osteoarthritis, right shoulder: Secondary | ICD-10-CM | POA: Diagnosis not present

## 2021-02-07 ENCOUNTER — Other Ambulatory Visit: Payer: Self-pay | Admitting: Family Medicine

## 2021-02-07 DIAGNOSIS — M19011 Primary osteoarthritis, right shoulder: Secondary | ICD-10-CM | POA: Diagnosis not present

## 2021-02-07 DIAGNOSIS — M75121 Complete rotator cuff tear or rupture of right shoulder, not specified as traumatic: Secondary | ICD-10-CM | POA: Diagnosis not present

## 2021-02-07 DIAGNOSIS — I10 Essential (primary) hypertension: Secondary | ICD-10-CM

## 2021-02-17 ENCOUNTER — Other Ambulatory Visit: Payer: Self-pay | Admitting: Family Medicine

## 2021-02-17 DIAGNOSIS — I10 Essential (primary) hypertension: Secondary | ICD-10-CM

## 2021-02-19 ENCOUNTER — Other Ambulatory Visit (HOSPITAL_BASED_OUTPATIENT_CLINIC_OR_DEPARTMENT_OTHER): Payer: Self-pay | Admitting: Family Medicine

## 2021-02-19 DIAGNOSIS — J418 Mixed simple and mucopurulent chronic bronchitis: Secondary | ICD-10-CM

## 2021-02-19 MED ORDER — ALBUTEROL SULFATE HFA 108 (90 BASE) MCG/ACT IN AERS: 2.0000 | INHALATION_SPRAY | Freq: Four times a day (QID) | RESPIRATORY_TRACT | 1 refills | Status: DC | PRN

## 2021-02-19 NOTE — Telephone Encounter (Signed)
Incoming Fax requesting new prescription for Albuterol Patients comments states " It has been a few years since [facility] updated the prescription" Will forward to Dr. Tennis Must [place] for advisement

## 2021-02-20 ENCOUNTER — Telehealth: Payer: Self-pay | Admitting: Primary Care

## 2021-02-20 ENCOUNTER — Other Ambulatory Visit: Payer: Self-pay | Admitting: Family Medicine

## 2021-02-20 ENCOUNTER — Encounter (HOSPITAL_BASED_OUTPATIENT_CLINIC_OR_DEPARTMENT_OTHER): Payer: Self-pay | Admitting: Family Medicine

## 2021-02-20 ENCOUNTER — Telehealth: Payer: Self-pay | Admitting: Adult Health

## 2021-02-20 DIAGNOSIS — I10 Essential (primary) hypertension: Secondary | ICD-10-CM

## 2021-02-20 MED ORDER — DOXYCYCLINE HYCLATE 100 MG PO TABS: 100.0000 mg | ORAL_TABLET | Freq: Two times a day (BID) | ORAL | 0 refills | Status: DC

## 2021-02-20 MED ORDER — PREDNISONE 10 MG PO TABS: ORAL_TABLET | ORAL | 0 refills | Status: DC

## 2021-02-20 NOTE — Telephone Encounter (Signed)
I can send in doxycycline '100mg'$  BID x 7 days and prednisone '20mg'$  x 5 days for suspected COPD exacerbation.  He should be taking mucinex 600-'1200mg'$  twice daily. Needs to test 5 days after covid exposure, which would be tomorrow. RX sent to [facility]

## 2021-02-20 NOTE — Telephone Encounter (Signed)
Pt returning a phone call. Pt can be reached at [phone]

## 2021-02-20 NOTE — Telephone Encounter (Signed)
I called and spoke with patient regarding [name] recs. Patient verbalized understanding and notified patient that beth sent in doxy and pred taper to preferred pharmacy. Patient will also test for covid tomorrow and let us know if it is positive for any further recs. Patient verbalized understanding, nothing further needed.

## 2021-02-20 NOTE — Telephone Encounter (Signed)
Patient scheduled for follow up on 07/08/21 Will authorize refills until next follow up appointment

## 2021-02-20 NOTE — Addendum Note (Signed)
Addended by: Martyn Ehrich on: 02/20/2021 04:04 PM   Modules accepted: Orders

## 2021-02-20 NOTE — Telephone Encounter (Signed)
ATC, left VM for callback. 

## 2021-02-20 NOTE — Telephone Encounter (Signed)
Primary Pulmonologist: Dr. Lamonte Sakai Last office visit and with whom: 10/04/20 with tammy parrett What do we see them for (pulmonary problems): COPD, Lung nodule Last OV assessment/plan: see below  Was appointment offered to patient (explain)?    10/04/2020 Follow up ; COPD and abnormal CT chest Patient returns for a follow-up visit.  Last seen February 13, 2020.  Patient says overall breathing has been doing well.  He denies any flare of cough or wheezing.  Remains on Trelegy inhaler daily.  No change in activity tolerance.  Patient says he remains very active. Patient previously been on Anoro last year since starting Trelegy has had improvement.  He is doing very well.  We talked about de-escalating however has had no flare in breathing over the last several months.   Patient did have a CT angio chest to follow a thoracic aortic aneurysm.  On the CT showed nodular changes in the right upper lobe and left lower lobe.  There was recommend to have a follow-up CT chest which was completed October 03, 2020 This showed no lymphadenopathy.  Stable mild emphysematous changes and stable biapical pleural and parenchymal scarring.  The left lower lobe inflammatory/infectious process has largely resolved.  There is minimal residual post scarring/atelectasis.  No worrisome lesions noted. We reviewed his CT results. Continue on TRELEGY 1 puff daily , rinse after use. Activity as tolerated. Follow up in 6 months with Dr. Lamonte Sakai  and As needed         Reason for call:  Patient sent a mychart message stating that he has been more congested for the last 5 days. Coughed up greenish/thick sputum today once. No complaints of chest tightness, fever, chills, body aches, N/V. Patient does feel slightly more SOB at times and wheezing which he stated is normal for him. He is using Trelegy inhaler and not having to use rescue inhaler. Patient did sit across the table on Saturday who tested positive for covid but has not gotten  covid tested and is waiting until Friday this week to do that but does not think this is related since It started before Saturday. Will route to [facility] as Dr. Lamonte Sakai has gone home for the day.  [name], please advise. Thanks!  (examples of things to ask: : When did symptoms start? Fever? Cough? Productive? Color to sputum? More sputum than usual? Wheezing? Have you needed increased oxygen? Are you taking your respiratory medications? What over the counter measures have you tried?)  Allergies  Allergen Reactions   Amlodipine Other (See Comments)    Leg cramping     Immunization History  Administered Date(s) Administered   Fluad Quad(high Dose 65+) 03/29/2019   Hepatitis A 02/18/1998, 08/21/1998   Influenza Split 04/01/2011, 05/18/2012   Influenza, High Dose Seasonal PF 04/23/2016, 03/17/2018   Influenza, Seasonal, Injecte, Preservative Fre 05/01/2017   Influenza,inj,Quad PF,6+ Mos 04/28/2013, 04/21/2014   Influenza-Unspecified 04/21/2015, 05/21/2020   PFIZER(Purple Top)SARS-COV-2 Vaccination 08/10/2019, 08/31/2019, 05/21/2020, 11/19/2020   Pneumococcal Conjugate-13 08/22/2014   Pneumococcal Polysaccharide-23 07/21/2002, 07/08/2016   Tdap 11/18/2005, 01/03/2015

## 2021-02-21 DIAGNOSIS — M9901 Segmental and somatic dysfunction of cervical region: Secondary | ICD-10-CM | POA: Diagnosis not present

## 2021-02-21 DIAGNOSIS — S39012A Strain of muscle, fascia and tendon of lower back, initial encounter: Secondary | ICD-10-CM | POA: Diagnosis not present

## 2021-02-21 DIAGNOSIS — M47817 Spondylosis without myelopathy or radiculopathy, lumbosacral region: Secondary | ICD-10-CM | POA: Diagnosis not present

## 2021-02-21 DIAGNOSIS — M9902 Segmental and somatic dysfunction of thoracic region: Secondary | ICD-10-CM | POA: Diagnosis not present

## 2021-02-21 DIAGNOSIS — M9903 Segmental and somatic dysfunction of lumbar region: Secondary | ICD-10-CM | POA: Diagnosis not present

## 2021-02-21 DIAGNOSIS — M47812 Spondylosis without myelopathy or radiculopathy, cervical region: Secondary | ICD-10-CM | POA: Diagnosis not present

## 2021-03-03 ENCOUNTER — Other Ambulatory Visit: Payer: Self-pay

## 2021-03-03 ENCOUNTER — Encounter (HOSPITAL_COMMUNITY): Payer: Self-pay | Admitting: Emergency Medicine

## 2021-03-03 ENCOUNTER — Ambulatory Visit (HOSPITAL_COMMUNITY)
Admission: EM | Admit: 2021-03-03 | Discharge: 2021-03-03 | Disposition: A | Discharge status: Home / Self Care | Payer: PPO | Attending: Student | Admitting: Student

## 2021-03-03 DIAGNOSIS — J069 Acute upper respiratory infection, unspecified: Secondary | ICD-10-CM

## 2021-03-03 DIAGNOSIS — J441 Chronic obstructive pulmonary disease with (acute) exacerbation: Secondary | ICD-10-CM

## 2021-03-03 DIAGNOSIS — Z112 Encounter for screening for other bacterial diseases: Secondary | ICD-10-CM | POA: Diagnosis not present

## 2021-03-03 DIAGNOSIS — Z1152 Encounter for screening for COVID-19: Secondary | ICD-10-CM | POA: Diagnosis not present

## 2021-03-03 LAB — POCT RAPID STREP A, ED / UC: Streptococcus, Group A Screen (Direct): NEGATIVE

## 2021-03-03 MED ORDER — PREDNISONE 10 MG (21) PO TBPK: ORAL_TABLET | Freq: Every day | ORAL | 0 refills | Status: DC

## 2021-03-03 NOTE — ED Provider Notes (Signed)
[place]    CSN: [id number] Arrival date & time: 03/03/21  1008      History   Chief Complaint Chief Complaint  Patient presents with   Generalized Body Aches   Sore Throat   Shortness of Breath   Cough    HPI George Watson is a 75 y.o. male presenting with COPD and viral URI. Notes exposure to covid19.  Medical history COPD, aortic aneurysm, arthritis, BPH, emphysema, hypertension, GERD, hepatitis C, HSV, hyperlipidemia, inguinal hernia, tuberculosis, aortic atherosclerosis.  Notes about 1 day of cough, body aches, sore throat, shortness of breath.  States that shortness of breath is consistent with past COPD exacerbations and denies dizziness, chest pain, weakness today.  Has not monitored temperature at home, has taken antipyretic.  Fully vaccinated for COVID, requesting COVID test given exposure to this.  Denies trouble swallowing, sensation of throat closing. Denies n/v/d, dizziness, weakenss, chest pain, facial pain, teeth pain, headaches, loss of taste/smell, swollen lymph nodes, ear pain. Using inhalers as directed.   HPI  Past Medical History:  Diagnosis Date   Aortic aneurysm (HCC)    Arthritis    BPH (benign prostatic hypertrophy)    COPD (chronic obstructive pulmonary disease) (HCC)    DDD (degenerative disc disease), lumbosacral    Elevated PSA    Emphysema of lung (HCC)    Essential hypertension, benign    diet controlled   Fracture of shaft of clavicle    GERD (gastroesophageal reflux disease)    H/O drug abuse ([place])    multisubstance   Hepatitis C 10/1996   HSV-2 (herpes simplex virus 2) infection    Hx of biopsy 08/2004   liver   Hx of colonic polyps 2022   Hyperlipidemia    Inguinal hernia    right   Other and unspecified hyperlipidemia    Prostate cancer ([place])    Substance abuse ([place])    Tuberculosis     Patient Active Problem List   Diagnosis Date Noted   Lung nodule 10/05/2020   Gastroenteritis 05/08/2020   TIA (transient  ischemic attack) 07/07/2019   Dilated aortic root ([place]) 06/13/2019   Pure hypertriglyceridemia 04/08/2019   Fear of flying 02/11/2018   Tear of right rotator cuff 08/31/2017   Prediabetes 01/06/2017   Atherosclerosis of aorta ([place]) 07/09/2016   Degeneration of lumbar or lumbosacral intervertebral disc 02/15/2014   Essential hypertension, benign    Hyperlipidemia with target LDL less than 70    HSV-2 (herpes simplex virus 2) infection    COPD (chronic obstructive pulmonary disease) (HCC)    Benign prostatic hyperplasia    DDD (degenerative disc disease), lumbosacral    Prostate cancer ([place])    Hepatitis C, chronic ([place]) 10/19/1996    Past Surgical History:  Procedure Laterality Date   CERVICAL SPINE SURGERY     fracture ribs     3   HERNIA REPAIR     INGUINAL HERNIA REPAIR  07/01/2012   Procedure: HERNIA REPAIR INGUINAL ADULT;  Surgeon: Madilyn Hook, DO;  Location: [facility];  Service: General;  Laterality: Right;  with Mesh   left knee meniscus repair  1992   right rotator cuff  1992   right shoulder arthroscopy  2011       Home Medications    Prior to Admission medications   Medication Sig Start Date End Date Taking? Authorizing Provider  predniSONE (STERAPRED UNI-PAK 21 TAB) 10 MG (21) TBPK tablet Take by mouth daily. Take 6 tabs by  mouth daily  for 2 days, then 5 tabs for 2 days, then 4 tabs for 2 days, then 3 tabs for 2 days, 2 tabs for 2 days, then 1 tab by mouth daily for 2 days 03/03/21  Yes Phillip Heal, Sherlon Handing, PA-C  albuterol (VENTOLIN HFA) 108 (90 Base) MCG/ACT inhaler Inhale 2 puffs into the lungs every 6 (six) hours as needed for shortness of breath. 02/19/21   de [place], Raymond J, MD  aspirin EC 81 MG EC tablet Take 1 tablet (81 mg total) by mouth daily. 07/09/19   Black, Lezlie Octave, NP  B Complex Vitamins (B COMPLEX PO) Take 1 tablet by mouth daily.    [provider]  cholecalciferol (VITAMIN D3) 25 MCG (1000 UT) tablet Take 1,000 Units by mouth daily.    [provider]  Coenzyme Q10-Vitamin E 100-300 MG-UNIT CHEW Chew 1 tablet by mouth daily.    [provider]  diazepam (VALIUM) 2 MG tablet Take 1-2 tabs 30 minutes before flight. 11/05/20   [name], MD  diphenoxylate-atropine (LOMOTIL) 2.5-0.025 MG tablet Take by mouth See admin instructions. 2 tablets at the first sign of loose stool, then 1 tablet after each loose stool. Not to exceed 8 tabs per day    [provider]  dutasteride (AVODART) 0.5 MG capsule Take 0.5 mg by mouth daily.    [provider]  famotidine (PEPCID) 20 MG tablet Take 20 mg by mouth every evening.     [provider]  glucosamine-chondroitin 500-400 MG tablet Take 3 tablets by mouth daily. Patient taking differently: Take 2 tablets by mouth daily. 02/15/18   Marrian Salvage, FNP  hydrochlorothiazide (MICROZIDE) 12.5 MG capsule Take 1 capsule (12.5 mg total) by mouth daily. 11/23/20   [name], MD  losartan (COZAAR) 100 MG tablet TAKE 1 TABLET BY MOUTH EVERY DAY 02/20/21   [place], Blondell Reveal, MD  metaxalone (SKELAXIN) 800 MG tablet Take 1 tablet (800 mg total) by mouth 3 (three) times daily as needed for muscle spasms. 11/05/20   [name], MD  Multiple Vitamins-Minerals (CENTRUM SILVER PO) Take 1 tablet by mouth daily.    [provider]  mupirocin cream (BACTROBAN) 2 % Apply 1 application topically 2 (two) times daily. 04/15/20   [name], MD  Omega-3 Fatty Acids (FISH OIL PO) Take 1 tablet by mouth daily.    [provider]  rosuvastatin (CRESTOR) 5 MG tablet Take 1 tablet (5 mg total) by mouth daily. 04/12/20   [name], MD  tamsulosin (FLOMAX) 0.4 MG CAPS capsule Take 0.4 mg by mouth daily.     [provider]  TRELEGY ELLIPTA 100-62.5-25 MCG/INH AEPB TAKE 1 PUFF BY MOUTH EVERY DAY 12/19/20   Collene Gobble, MD  TURMERIC CURCUMIN PO Take 1 tablet by mouth daily.    [provider]    Family History Family History   Problem Relation Age of Onset   Breast cancer Sister    Leukemia Mother    Throat cancer Father    Esophageal cancer Father    Prostate cancer Brother    Melanoma Brother    Colon cancer Neg Hx    Stomach cancer Neg Hx    Pancreatic cancer Neg Hx    Liver disease Neg Hx     Social History Social History   Tobacco Use   Smoking status: Former    Years: 43.00    Types: Cigarettes    Quit date: 07/21/2002  Years since quitting: 18.6   Smokeless tobacco: Never  Vaping Use   Vaping Use: Never used  Substance Use Topics   Alcohol use: No   Drug use: No    Comment: 26 years ago cocaine, heroin. methadone     Allergies   Amlodipine   Review of Systems Review of Systems  Constitutional:  Positive for chills. Negative for appetite change and fever.  HENT:  Positive for congestion and sore throat. Negative for ear pain, rhinorrhea, sinus pressure and sinus pain.   Eyes:  Negative for redness and visual disturbance.  Respiratory:  Positive for cough, shortness of breath and wheezing. Negative for chest tightness.   Cardiovascular:  Negative for chest pain and palpitations.  Gastrointestinal:  Negative for abdominal pain, constipation, diarrhea, nausea and vomiting.  Genitourinary:  Negative for dysuria, frequency and urgency.  Musculoskeletal:  Positive for myalgias.  Neurological:  Negative for dizziness, weakness and headaches.  Psychiatric/Behavioral:  Negative for confusion.   All other systems reviewed and are negative.   Physical Exam Triage Vital Signs ED Triage Vitals  Enc Vitals Group     BP 03/03/21 1038 132/69     Pulse Rate 03/03/21 1038 71     Resp 03/03/21 1038 18     Temp 03/03/21 1038 98.2 F (36.8 C)     Temp Source 03/03/21 1038 Oral     SpO2 03/03/21 1038 96 %     Weight --      Height --      Head Circumference --      Peak Flow --      Pain Score 03/03/21 1039 2     Pain Loc --      Pain Edu? --      Excl. in [place]? --    No data  found.  Updated Vital Signs BP 132/69   Pulse 71   Temp 98.2 F (36.8 C) (Oral)   Resp 18   SpO2 96%   Visual Acuity Right Eye Distance:   Left Eye Distance:   Bilateral Distance:    Right Eye Near:   Left Eye Near:    Bilateral Near:     Physical Exam Vitals reviewed.  Constitutional:      General: He is not in acute distress.    Appearance: Normal appearance. He is not ill-appearing.  HENT:     Head: Normocephalic and atraumatic.     Right Ear: Tympanic membrane, ear canal and external ear normal. No tenderness. No middle ear effusion. There is no impacted cerumen. Tympanic membrane is not perforated, erythematous, retracted or bulging.     Left Ear: Tympanic membrane, ear canal and external ear normal. No tenderness.  No middle ear effusion. There is no impacted cerumen. Tympanic membrane is not perforated, erythematous, retracted or bulging.     Nose: Nose normal. No congestion.     Mouth/Throat:     Mouth: Mucous membranes are moist.     Pharynx: Uvula midline. Posterior oropharyngeal erythema present. No oropharyngeal exudate.     Tonsils: 1+ on the right. 1+ on the left.     Comments: Smooth erythema posterior pharynx On exam, uvula is midline, he is tolerating secretions without difficulty, there is no trismus, no drooling, he has normal phonation  Eyes:     Extraocular Movements: Extraocular movements intact.     Pupils: Pupils are equal, round, and reactive to light.  Cardiovascular:     Rate and Rhythm: Normal rate and regular rhythm.  Heart sounds: Normal heart sounds.  Pulmonary:     Effort: Pulmonary effort is normal. No tachypnea, bradypnea, accessory muscle usage, prolonged expiration or respiratory distress.     Breath sounds: Wheezing present. No decreased breath sounds, rhonchi or rales.     Comments: Expiratory wheezes thoughout Abdominal:     Palpations: Abdomen is soft.     Tenderness: There is no abdominal tenderness. There is no guarding or  rebound.  Neurological:     General: No focal deficit present.     Mental Status: He is alert and oriented to person, place, and time.  Psychiatric:        Mood and Affect: Mood normal.        Behavior: Behavior normal.        Thought Content: Thought content normal.        Judgment: Judgment normal.     UC Treatments / Results  Labs (all labs ordered are listed, but only abnormal results are displayed) Labs Reviewed  SARS CORONAVIRUS 2 (TAT 6-24 HRS)  POCT RAPID STREP A, ED / UC    EKG   Radiology No results found.  Procedures Procedures (including critical care time)  Medications Ordered in UC Medications - No data to display  Initial Impression / Assessment and Plan / UC Course  I have reviewed the triage vital signs and the nursing notes.  Pertinent labs & imaging results that were available during my care of the patient were reviewed by me and considered in my medical decision making (see chart for details).     This patient is a very pleasant 75 y.o. year old male presenting with COPD exacerbation due to viral URI. Afebrile, nontachy.  Expiratory wheezes throughout consistent with COPD exacerbation. No CP, dizziness today.   Rapid strep negative, culture sent.  He is fully vaccinated and double boosted for covid19. Covid PCR sent. If positive, he would be a candidate for Molnupiravir. Given current medications he is not a candidate for Paxlovid; did not check a BMP today.   Continue albuterol, trelegy ellipita inhalers. Prednisone taper sent.   ED return precautions discussed. Patient verbalizes understanding and agreement.   Coding Level 4 for acute illness with systemic symptoms, and prescription drug management   Final Clinical Impressions(s) / UC Diagnoses   Final diagnoses:  COPD exacerbation ([place])  Screening for streptococcal infection  Encounter for screening for COVID-19  Viral URI with cough     Discharge Instructions      -Prednisone  taper for cough/bronchitis. I recommend taking this in the morning as it could give you energy.  Avoid NSAIDs like ibuprofen and alleve while taking this medication as they can increase your risk of stomach upset and even GI bleeding when in combination with a steroid. You can continue tylenol (acetaminophen) up to '1000mg'$  3x daily. -Continue inhalers -With a virus, you're typically contagious for 5-7 days, or as long as you're having fevers.  -If you're covid positive, we'll call you in about 1 day to discuss antiviral treatment. You're a candidate for [employer].   ED Prescriptions     Medication Sig Dispense Auth. Provider   predniSONE (STERAPRED UNI-PAK 21 TAB) 10 MG (21) TBPK tablet Take by mouth daily. Take 6 tabs by mouth daily  for 2 days, then 5 tabs for 2 days, then 4 tabs for 2 days, then 3 tabs for 2 days, 2 tabs for 2 days, then 1 tab by mouth daily for 2 days 42 tablet Marin Roberts  E, PA-C      PDMP not reviewed this encounter.   Hazel Sams, PA-C 03/03/21 1116

## 2021-03-03 NOTE — Discharge Instructions (Signed)
-  Prednisone taper for cough/bronchitis. I recommend taking this in the morning as it could give you energy.  Avoid NSAIDs like ibuprofen and alleve while taking this medication as they can increase your risk of stomach upset and even GI bleeding when in combination with a steroid. You can continue tylenol (acetaminophen) up to '1000mg'$  3x daily. -Continue inhalers -With a virus, you're typically contagious for 5-7 days, or as long as you're having fevers.  -If you're covid positive, we'll call you in about 1 day to discuss antiviral treatment. You're a candidate for [employer].

## 2021-03-03 NOTE — ED Triage Notes (Signed)
Pt is present today with sore throat, SOB, cough, and body aches Pt states that sx started yesterday

## 2021-03-04 LAB — SARS CORONAVIRUS 2 (TAT 6-24 HRS): SARS Coronavirus 2: NEGATIVE

## 2021-03-05 LAB — CULTURE, GROUP A STREP (THRC)

## 2021-03-28 ENCOUNTER — Encounter (HOSPITAL_BASED_OUTPATIENT_CLINIC_OR_DEPARTMENT_OTHER): Payer: Self-pay | Admitting: Family Medicine

## 2021-03-29 ENCOUNTER — Telehealth (HOSPITAL_BASED_OUTPATIENT_CLINIC_OR_DEPARTMENT_OTHER): Payer: Self-pay | Admitting: Family Medicine

## 2021-03-29 NOTE — Telephone Encounter (Signed)
LVM to cb for appointment with provider to discuss the issues he is having from the [place] message he sent in on 9/8.

## 2021-03-29 NOTE — Telephone Encounter (Signed)
Left a voice mail on 9/9 at 10:30 to schedule an appointment with provider.

## 2021-04-01 DIAGNOSIS — M47812 Spondylosis without myelopathy or radiculopathy, cervical region: Secondary | ICD-10-CM | POA: Diagnosis not present

## 2021-04-01 DIAGNOSIS — M47817 Spondylosis without myelopathy or radiculopathy, lumbosacral region: Secondary | ICD-10-CM | POA: Diagnosis not present

## 2021-04-01 DIAGNOSIS — M9903 Segmental and somatic dysfunction of lumbar region: Secondary | ICD-10-CM | POA: Diagnosis not present

## 2021-04-01 DIAGNOSIS — M9901 Segmental and somatic dysfunction of cervical region: Secondary | ICD-10-CM | POA: Diagnosis not present

## 2021-04-01 DIAGNOSIS — M9902 Segmental and somatic dysfunction of thoracic region: Secondary | ICD-10-CM | POA: Diagnosis not present

## 2021-04-01 DIAGNOSIS — S39012A Strain of muscle, fascia and tendon of lower back, initial encounter: Secondary | ICD-10-CM | POA: Diagnosis not present

## 2021-04-02 NOTE — Telephone Encounter (Signed)
LVM again on 9/13 @ 1:55 to see if the patient would like to schedule an appointment from his MyChart message that was sent on 9/8.

## 2021-04-03 ENCOUNTER — Other Ambulatory Visit: Payer: Self-pay | Admitting: Family Medicine

## 2021-04-04 ENCOUNTER — Other Ambulatory Visit: Payer: Self-pay | Admitting: Family Medicine

## 2021-04-07 ENCOUNTER — Encounter (HOSPITAL_BASED_OUTPATIENT_CLINIC_OR_DEPARTMENT_OTHER): Payer: Self-pay | Admitting: Family Medicine

## 2021-04-08 MED ORDER — ROSUVASTATIN CALCIUM 5 MG PO TABS: 5.0000 mg | ORAL_TABLET | Freq: Every day | ORAL | 0 refills | Status: DC

## 2021-04-22 NOTE — Telephone Encounter (Signed)
Sorry to hear that you are sick Recommend checking for COVID in 2 days to verify negative. Recommend fluids and rest Tylenol as needed for fever Saline nasal rinses as needed Mucinex DM twice daily as needed for cough if cough or congestion develop. Claritin 10 mg daily as needed for nasal drainage. If not improving or symptoms worsen can make an office visit for virtual visit later this week.  Please contact office for sooner follow up if symptoms do not improve or worsen or seek emergency care

## 2021-04-22 NOTE — Telephone Encounter (Signed)
TP please advise. Thanks  Good morning, Have been experiencing increasing breathing difficulty and nasal congestion for the past few days. This morning I woke with a slight fever which has since returned to almost normal(99 from 101). Took a home covid test which showed negative. I've cancelled appointments for the day and am going to stay home and rest. What do you recommend? George Watson  I called and spoke with pt and he stated that the nasal congestion is green in color.  No cough.  No chest congestion. He is not sure if this is just from a sinus infection or emphysema.  He is [facility] for the last couple of days but his oxygen level was at 97% when we spoke on the phone.  He did have a fever but this is back down now to 99.  It did get to 101. Pt is requesting further recs.  Please advise. Thanks

## 2021-04-23 ENCOUNTER — Other Ambulatory Visit: Payer: Self-pay

## 2021-04-23 ENCOUNTER — Ambulatory Visit (HOSPITAL_COMMUNITY)
Admission: EM | Admit: 2021-04-23 | Discharge: 2021-04-23 | Disposition: A | Discharge status: Home / Self Care | Payer: PPO | Attending: Family Medicine | Admitting: Family Medicine

## 2021-04-23 ENCOUNTER — Encounter (HOSPITAL_COMMUNITY): Payer: Self-pay

## 2021-04-23 ENCOUNTER — Ambulatory Visit (INDEPENDENT_AMBULATORY_CARE_PROVIDER_SITE_OTHER): Payer: PPO

## 2021-04-23 DIAGNOSIS — J441 Chronic obstructive pulmonary disease with (acute) exacerbation: Secondary | ICD-10-CM

## 2021-04-23 DIAGNOSIS — R059 Cough, unspecified: Secondary | ICD-10-CM

## 2021-04-23 DIAGNOSIS — R509 Fever, unspecified: Secondary | ICD-10-CM

## 2021-04-23 DIAGNOSIS — R0602 Shortness of breath: Secondary | ICD-10-CM | POA: Diagnosis not present

## 2021-04-23 MED ORDER — AZITHROMYCIN 250 MG PO TABS: 250.0000 mg | ORAL_TABLET | Freq: Every day | ORAL | 0 refills | Status: DC

## 2021-04-23 MED ORDER — PREDNISONE 10 MG (48) PO TBPK: ORAL_TABLET | ORAL | 0 refills | Status: DC

## 2021-04-23 NOTE — ED Triage Notes (Signed)
Pt c/o SOB, cough, congestion, and fever since Sunday evening. States last tylenol last night. Pt speaking in complete sentences, no distress at this time. States had a neg home covid test yesterday.

## 2021-04-23 NOTE — ED Provider Notes (Signed)
[place]   [id number] 04/23/21 Arrival Time: 4098  ASSESSMENT & PLAN:  1. COPD exacerbation ([place])    I have personally viewed the imaging studies ordered this visit. No signs of PNA on CXR.  Will tx as COPD exacerbation. Begin: Meds ordered this encounter  Medications   predniSONE (STERAPRED UNI-PAK 48 TAB) 10 MG (48) TBPK tablet    Sig: Take as directed.    Dispense:  48 tablet    Refill:  0   azithromycin (ZITHROMAX) 250 MG tablet    Sig: Take 1 tablet (250 mg total) by mouth daily. Take first 2 tablets together, then 1 every day until finished.    Dispense:  6 tablet    Refill:  0   OTC tx as needed.   Follow-up Information     [place], Blondell Reveal, MD.   Specialty: Family Medicine Why: If worsening or failing to improve as anticipated. Contact information: [place] [address] [phone]                 Reviewed expectations re: course of current medical issues. Questions answered. Outlined signs and symptoms indicating need for more acute intervention. Understanding verbalized. After Visit Summary given.   SUBJECTIVE: History from: patient. George Watson is a 75 y.o. male who reports several days of cough, congestion, SOB. Cough is productive. Unsure of fever. Tylenol has helped. With body aches. Denies: CP. Normal PO intake without n/v/d.   OBJECTIVE:  Vitals:   04/23/21 1120  BP: (!) 165/80  Pulse: 80  Resp: 18  Temp: 99.2 F (37.3 C)  TempSrc: Tympanic  SpO2: 94%    General appearance: alert; no distress Eyes: PERRLA; EOMI; conjunctiva normal HENT: [place]; AT; with mild nasal congestion Neck: supple  Lungs: speaks full sentences without difficulty; unlabored; wheezing bilaterally Extremities: no edema Skin: warm and dry Neurologic: normal gait Psychological: alert and cooperative; normal mood and affect   Imaging: DG Chest 2 View  Result Date: 04/23/2021 CLINICAL DATA:  75 year old male with  shortness of breath, cough, congestion and fever for 2 days. Former smoker. EXAM: CHEST - 2 VIEW COMPARISON:  Chest CT 10/03/2020 and earlier. FINDINGS: PA and lateral views. Lung volumes, mediastinal contours, and lung markings appear stable since 2019. Chronically increased interstitium appears stable. No pneumothorax, pulmonary edema, pleural effusion or acute pulmonary opacity. Calcified aortic atherosclerosis. Visualized tracheal air column is within normal limits. Prior cervical ACDF. No acute osseous abnormality identified. Negative visible bowel gas pattern. IMPRESSION: No acute cardiopulmonary abnormality. Electronically Signed   By: Genevie Ann M.D.   On: 04/23/2021 12:02    Allergies  Allergen Reactions   Amlodipine Other (See Comments)    Leg cramping     Past Medical History:  Diagnosis Date   Aortic aneurysm (HCC)    Arthritis    BPH (benign prostatic hypertrophy)    COPD (chronic obstructive pulmonary disease) (HCC)    DDD (degenerative disc disease), lumbosacral    Elevated PSA    Emphysema of lung (HCC)    Essential hypertension, benign    diet controlled   Fracture of shaft of clavicle    GERD (gastroesophageal reflux disease)    H/O drug abuse ([place])    multisubstance   Hepatitis C 10/1996   HSV-2 (herpes simplex virus 2) infection    Hx of biopsy 08/2004   liver   Hx of colonic polyps 2022   Hyperlipidemia    Inguinal hernia    right  Other and unspecified hyperlipidemia    Prostate cancer ([place])    Substance abuse ([place])    Tuberculosis    Social History   Socioeconomic History   Marital status: Married    Spouse name: Not on file   Number of children: Not on file   Years of education: Not on file   Highest education level: Not on file  Occupational History   Occupation: Geophysicist/field seismologist   Occupation: Retired     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 methadone   Sexual activity: Yes    Comment: number of sex partners in the last 12 months  1  Other Topics Concern   Not on file  Social History Narrative      Married to [employer]. Exercise cardio daily for 30 minutes. Education: [employer].   Social Determinants of Health   Financial Resource Strain: Low Risk    Difficulty of Paying Living Expenses: Not hard at all  Food Insecurity: No Food Insecurity   Worried About Charity fundraiser in the Last Year: Never true   [place] in the Last Year: Never true  Transportation Needs: No Transportation Needs   Lack of Transportation (Medical): No   Lack of Transportation (Non-Medical): No  Physical Activity: Sufficiently Active   Days of Exercise per Week: 7 days   Minutes of Exercise per Session: 60 min  Stress: Not on file  Social Connections: Moderately Integrated   Frequency of Communication with Friends and Family: More than three times a week   Frequency of Social Gatherings with Friends and Family: Three times a week   Attends Religious Services: Never   Active Member of Clubs or Organizations: Yes   Attends Archivist Meetings: 1 to 4 times per year   Marital Status: Married  Human resources officer Violence: Not At Risk   Fear of Current or Ex-Partner: No   Emotionally Abused: No   Physically Abused: No   Sexually Abused: No   Family History  Problem Relation Age of Onset   Breast cancer Sister    Leukemia Mother    Throat cancer Father    Esophageal cancer Father    Prostate cancer Brother    Melanoma Brother    Colon cancer Neg Hx    Stomach cancer Neg Hx    Pancreatic cancer Neg Hx    Liver disease Neg Hx    Past Surgical History:  Procedure Laterality Date   CERVICAL SPINE SURGERY     fracture  ribs     3   HERNIA REPAIR     INGUINAL HERNIA REPAIR  07/01/2012   Procedure: HERNIA REPAIR INGUINAL ADULT;  Surgeon: Madilyn Hook, DO;  Location: [facility];  Service: General;  Laterality: Right;  with Mesh   left knee meniscus repair  1992   right rotator cuff  1992   right shoulder arthroscopy  2011     Vanessa Kick, MD 04/23/21 1300

## 2021-04-29 DIAGNOSIS — M9903 Segmental and somatic dysfunction of lumbar region: Secondary | ICD-10-CM | POA: Diagnosis not present

## 2021-04-29 DIAGNOSIS — M9902 Segmental and somatic dysfunction of thoracic region: Secondary | ICD-10-CM | POA: Diagnosis not present

## 2021-04-29 DIAGNOSIS — M47812 Spondylosis without myelopathy or radiculopathy, cervical region: Secondary | ICD-10-CM | POA: Diagnosis not present

## 2021-04-29 DIAGNOSIS — S39012A Strain of muscle, fascia and tendon of lower back, initial encounter: Secondary | ICD-10-CM | POA: Diagnosis not present

## 2021-04-29 DIAGNOSIS — M47817 Spondylosis without myelopathy or radiculopathy, lumbosacral region: Secondary | ICD-10-CM | POA: Diagnosis not present

## 2021-04-29 DIAGNOSIS — M9901 Segmental and somatic dysfunction of cervical region: Secondary | ICD-10-CM | POA: Diagnosis not present

## 2021-05-06 DIAGNOSIS — H04123 Dry eye syndrome of bilateral lacrimal glands: Secondary | ICD-10-CM | POA: Diagnosis not present

## 2021-05-06 DIAGNOSIS — Z961 Presence of intraocular lens: Secondary | ICD-10-CM | POA: Diagnosis not present

## 2021-05-11 ENCOUNTER — Encounter (HOSPITAL_COMMUNITY): Payer: Self-pay | Admitting: *Deleted

## 2021-05-11 ENCOUNTER — Other Ambulatory Visit: Payer: Self-pay

## 2021-05-11 ENCOUNTER — Ambulatory Visit (HOSPITAL_COMMUNITY)
Admission: EM | Admit: 2021-05-11 | Discharge: 2021-05-11 | Disposition: A | Discharge status: Home / Self Care | Payer: PPO | Attending: Family Medicine | Admitting: Family Medicine

## 2021-05-11 DIAGNOSIS — J441 Chronic obstructive pulmonary disease with (acute) exacerbation: Secondary | ICD-10-CM

## 2021-05-11 HISTORY — DX: Unspecified cirrhosis of liver: K74.60

## 2021-05-11 HISTORY — DX: Unspecified viral hepatitis B without hepatic coma: B19.10

## 2021-05-11 MED ORDER — PREDNISONE 10 MG PO TABS: ORAL_TABLET | ORAL | 0 refills | Status: DC

## 2021-05-11 MED ORDER — DOXYCYCLINE HYCLATE 100 MG PO CAPS: 100.0000 mg | ORAL_CAPSULE | Freq: Two times a day (BID) | ORAL | 0 refills | Status: DC

## 2021-05-11 NOTE — ED Triage Notes (Signed)
Pt reports getting "emphysema flare-up" every few months.  States "started feeling puny" yesterday, and more so today, along with some SOB.  Denies cough.  C/O slight nasal congestion.  Denies fevers.  States has "mild emphysema" not requiring O2 use.

## 2021-05-11 NOTE — ED Provider Notes (Signed)
[place]    CSN: [id number] Arrival date & time: 05/11/21  1247      History   Chief Complaint Chief Complaint  Patient presents with   Shortness of Breath    HPI George Watson is a 75 y.o. male.   Presenting today with 1 day history of fatigue, chest tightness, wheezing, shortness of breath.  States he has a history of COPD on Trelegy and albuterol as needed for which she has exacerbations frequently.  Last exacerbation was about 2 weeks ago, treated well with prednisone and azithromycin.  He is consistent with his inhaler regimen and followed closely by pulmonology.  He denies fever, chills, body aches, chest pain, abdominal pain, nausea vomiting diarrhea, recent sick contacts.   Past Medical History:  Diagnosis Date   Aortic aneurysm (HCC)    Arthritis    BPH (benign prostatic hypertrophy)    Cirrhosis (HCC)    COPD (chronic obstructive pulmonary disease) (HCC)    DDD (degenerative disc disease), lumbosacral    Elevated PSA    Emphysema of lung (HCC)    Essential hypertension, benign    diet controlled   Fracture of shaft of clavicle    GERD (gastroesophageal reflux disease)    H/O drug abuse ([place])    multisubstance   Hepatitis B    Hepatitis C 10/1996   HSV-2 (herpes simplex virus 2) infection    Hx of biopsy 08/2004   liver   Hx of colonic polyps 2022   Hyperlipidemia    Inguinal hernia    right   Other and unspecified hyperlipidemia    Prostate cancer ([place])    Substance abuse ([place])    Tuberculosis     Patient Active Problem List   Diagnosis Date Noted   Lung nodule 10/05/2020   Gastroenteritis 05/08/2020   TIA (transient ischemic attack) 07/07/2019   Dilated aortic root ([place]) 06/13/2019   Pure hypertriglyceridemia 04/08/2019   Fear of flying 02/11/2018   Tear of right rotator cuff 08/31/2017   Prediabetes 01/06/2017   Atherosclerosis of aorta ([place]) 07/09/2016   Degeneration of lumbar or lumbosacral intervertebral disc 02/15/2014    Essential hypertension, benign    Hyperlipidemia with target LDL less than 70    HSV-2 (herpes simplex virus 2) infection    COPD (chronic obstructive pulmonary disease) (HCC)    Benign prostatic hyperplasia    DDD (degenerative disc disease), lumbosacral    Prostate cancer ([place])    Hepatitis C, chronic ([place]) 10/19/1996    Past Surgical History:  Procedure Laterality Date   BACK SURGERY     CERVICAL SPINE SURGERY     fracture ribs     3   HERNIA REPAIR     INGUINAL HERNIA REPAIR  07/01/2012   Procedure: HERNIA REPAIR INGUINAL ADULT;  Surgeon: Madilyn Hook, DO;  Location: [facility];  Service: General;  Laterality: Right;  with Mesh   left knee meniscus repair  07/21/1990   right rotator cuff  07/21/1990   right shoulder arthroscopy  07/21/2009       Home Medications    Prior to Admission medications   Medication Sig Start Date End Date Taking? Authorizing Provider  albuterol (VENTOLIN HFA) 108 (90 Base) MCG/ACT inhaler Inhale 2 puffs into the lungs every 6 (six) hours as needed for shortness of breath. 02/19/21  Yes de [place], Raymond J, MD  aspirin EC 81 MG EC tablet Take 1 tablet (81 mg total) by mouth daily. 07/09/19  Yes Black,  Lezlie Octave, NP  B Complex Vitamins (B COMPLEX PO) Take 1 tablet by mouth daily.   Yes [provider]  cholecalciferol (VITAMIN D3) 25 MCG (1000 UT) tablet Take 1,000 Units by mouth daily.   Yes [provider]  Coenzyme Q10-Vitamin E 100-300 MG-UNIT CHEW Chew 1 tablet by mouth daily.   Yes [provider]  doxycycline (VIBRAMYCIN) 100 MG capsule Take 1 capsule (100 mg total) by mouth 2 (two) times daily. 05/11/21  Yes Volney American, PA-C  dutasteride (AVODART) 0.5 MG capsule Take 0.5 mg by mouth daily.   Yes [provider]  famotidine (PEPCID) 20 MG tablet Take 20 mg by mouth every evening.    Yes [provider]  glucosamine-chondroitin 500-400 MG tablet Take 3 tablets by mouth daily. Patient taking  differently: Take 2 tablets by mouth daily. 02/15/18  Yes Marrian Salvage, FNP  hydrochlorothiazide (MICROZIDE) 12.5 MG capsule Take 1 capsule (12.5 mg total) by mouth daily. 11/23/20  Yes [name], MD  losartan (COZAAR) 100 MG tablet TAKE 1 TABLET BY MOUTH EVERY DAY 02/20/21  Yes de [place], Raymond J, MD  Multiple Vitamins-Minerals (CENTRUM SILVER PO) Take 1 tablet by mouth daily.   Yes [provider]  mupirocin cream (BACTROBAN) 2 % Apply 1 application topically 2 (two) times daily. 04/15/20  Yes [name], MD  Omega-3 Fatty Acids (FISH OIL PO) Take 1 tablet by mouth daily.   Yes [provider]  predniSONE (DELTASONE) 10 MG tablet Take 6 tabs daily x 2 days, 5 tabs daily x 2 days, 4 tabs daily x 2 days, etc 05/11/21  Yes Volney American, PA-C  tamsulosin (FLOMAX) 0.4 MG CAPS capsule Take 0.4 mg by mouth daily.    Yes [provider]  TRELEGY ELLIPTA 100-62.5-25 MCG/INH AEPB TAKE 1 PUFF BY MOUTH EVERY DAY 12/19/20  Yes Byrum, Rose Fillers, MD  TURMERIC CURCUMIN PO Take 1 tablet by mouth daily.   Yes [provider]  azithromycin (ZITHROMAX) 250 MG tablet Take 1 tablet (250 mg total) by mouth daily. Take first 2 tablets together, then 1 every day until finished. 04/23/21   Vanessa Kick, MD  diazepam (VALIUM) 2 MG tablet Take 1-2 tabs 30 minutes before flight. 11/05/20   [name], MD  diphenoxylate-atropine (LOMOTIL) 2.5-0.025 MG tablet Take by mouth See admin instructions. 2 tablets at the first sign of loose stool, then 1 tablet after each loose stool. Not to exceed 8 tabs per day    [provider]  metaxalone (SKELAXIN) 800 MG tablet Take 1 tablet (800 mg total) by mouth 3 (three) times daily as needed for muscle spasms. 11/05/20   [name], MD  rosuvastatin (CRESTOR) 5 MG tablet Take 1 tablet (5 mg total) by mouth daily. 04/08/21   de [place], Blondell Reveal, MD    Family History Family History  Problem Relation Age of Onset   Breast  cancer Sister    Leukemia Mother    Throat cancer Father    Esophageal cancer Father    Prostate cancer Brother    Melanoma Brother    Colon cancer Neg Hx    Stomach cancer Neg Hx    Pancreatic cancer Neg Hx    Liver disease Neg Hx     Social History Social History   Tobacco Use   Smoking status: Former    Years: 43.00    Types: Cigarettes    Quit date: 07/21/2002    Years since quitting: 44.8  Smokeless tobacco: Never  Vaping Use   Vaping Use: Never used  Substance Use Topics   Alcohol use: No   Drug use: No    Comment: 26 years ago cocaine, heroin. methadone     Allergies   Amlodipine   Review of Systems Review of Systems Per HPI  Physical Exam Triage Vital Signs ED Triage Vitals  Enc Vitals Group     BP 05/11/21 1311 134/89     Pulse Rate 05/11/21 1311 81     Resp 05/11/21 1311 16     Temp 05/11/21 1311 98.7 F (37.1 C)     Temp Source 05/11/21 1311 Oral     SpO2 05/11/21 1311 97 %     Weight --      Height --      Head Circumference --      Peak Flow --      Pain Score 05/11/21 1312 0     Pain Loc --      Pain Edu? --      Excl. in [place]? --    No data found.  Updated Vital Signs BP 134/89   Pulse 81   Temp 98.7 F (37.1 C) (Oral)   Resp 16   SpO2 97%   Visual Acuity Right Eye Distance:   Left Eye Distance:   Bilateral Distance:    Right Eye Near:   Left Eye Near:    Bilateral Near:     Physical Exam Vitals and nursing note reviewed.  Constitutional:      Appearance: Normal appearance.  HENT:     Head: Atraumatic.     Right Ear: Tympanic membrane normal.     Left Ear: Tympanic membrane normal.     Nose: Nose normal.     Mouth/Throat:     Mouth: Mucous membranes are moist.     Pharynx: Oropharynx is clear.  Eyes:     Extraocular Movements: Extraocular movements intact.     Conjunctiva/sclera: Conjunctivae normal.  Cardiovascular:     Rate and Rhythm: Normal rate and regular rhythm.  Pulmonary:     Effort: Pulmonary effort  is normal. No respiratory distress.     Breath sounds: Wheezing present. No rales.     Comments: Decreased breath sounds throughout Musculoskeletal:        General: Normal range of motion.     Cervical back: Normal range of motion and neck supple.  Skin:    General: Skin is warm and dry.  Neurological:     General: No focal deficit present.     Mental Status: He is oriented to person, place, and time.  Psychiatric:        Mood and Affect: Mood normal.        Thought Content: Thought content normal.        Judgment: Judgment normal.     UC Treatments / Results  Labs (all labs ordered are listed, but only abnormal results are displayed) Labs Reviewed - No data to display  EKG   Radiology No results found.  Procedures Procedures (including critical care time)  Medications Ordered in UC Medications - No data to display  Initial Impression / Assessment and Plan / UC Course  I have reviewed the triage vital signs and the nursing notes.  Pertinent labs & imaging results that were available during my care of the patient were reviewed by me and considered in my medical decision making (see chart for details).     We will  treat suspected COPD exacerbation with doxycycline, extended prednisone taper.  Continue Trelegy, albuterol as needed.  Follow-up with pulmonologist for recheck.  Oxygen saturation 97% on room air, he is in no acute distress and no rales on exam.  Chest x-ray deferred with shared decision making today.  EKG showing normal sinus rhythm without acute ST or T wave changes.  Follow-up for worsening symptoms.  Final Clinical Impressions(s) / UC Diagnoses   Final diagnoses:  COPD exacerbation [facility])   Discharge Instructions   None    ED Prescriptions     Medication Sig Dispense Auth. Provider   doxycycline (VIBRAMYCIN) 100 MG capsule Take 1 capsule (100 mg total) by mouth 2 (two) times daily. 14 capsule Volney American, [place]   predniSONE (DELTASONE) 10  MG tablet Take 6 tabs daily x 2 days, 5 tabs daily x 2 days, 4 tabs daily x 2 days, etc 42 tablet Volney American, [place]      PDMP not reviewed this encounter.   Volney American, [place] 05/11/21 1419

## 2021-05-21 ENCOUNTER — Ambulatory Visit: Payer: PPO | Admitting: Adult Health

## 2021-05-21 ENCOUNTER — Encounter: Payer: Self-pay | Admitting: Adult Health

## 2021-05-21 ENCOUNTER — Other Ambulatory Visit: Payer: Self-pay

## 2021-05-21 VITALS — BP 138/76 | HR 87 | Ht 71.0 in | Wt 170.0 lb

## 2021-05-21 DIAGNOSIS — J449 Chronic obstructive pulmonary disease, unspecified: Secondary | ICD-10-CM | POA: Diagnosis not present

## 2021-05-21 LAB — CBC WITH DIFFERENTIAL/PLATELET
Basophils Absolute: 0.1 10*3/uL (ref 0.0–0.1)
Basophils Relative: 0.5 % (ref 0.0–3.0)
Eosinophils Absolute: 0.2 10*3/uL (ref 0.0–0.7)
Eosinophils Relative: 1.9 % (ref 0.0–5.0)
HCT: 46.9 % (ref 39.0–52.0)
Hemoglobin: 15.6 g/dL (ref 13.0–17.0)
Lymphocytes Relative: 26.1 % (ref 12.0–46.0)
Lymphs Abs: 3.3 10*3/uL (ref 0.7–4.0)
MCHC: 33.2 g/dL (ref 30.0–36.0)
MCV: 93.1 fl (ref 78.0–100.0)
Monocytes Absolute: 1.4 10*3/uL — ABNORMAL HIGH (ref 0.1–1.0)
Monocytes Relative: 11 % (ref 3.0–12.0)
Neutro Abs: 7.6 10*3/uL (ref 1.4–7.7)
Neutrophils Relative %: 60.5 % (ref 43.0–77.0)
Platelets: 253 10*3/uL (ref 150.0–400.0)
RBC: 5.04 Mil/uL (ref 4.22–5.81)
RDW: 13.7 % (ref 11.5–15.5)
WBC: 12.5 10*3/uL — ABNORMAL HIGH (ref 4.0–10.5)

## 2021-05-21 LAB — BASIC METABOLIC PANEL
BUN: 27 mg/dL — ABNORMAL HIGH (ref 6–23)
CO2: 28 mEq/L (ref 19–32)
Calcium: 9.9 mg/dL (ref 8.4–10.5)
Chloride: 103 mEq/L (ref 96–112)
Creatinine, Ser: 1.04 mg/dL (ref 0.40–1.50)
GFR: 70.94 mL/min (ref >60.00)
Glucose, Bld: 113 mg/dL — ABNORMAL HIGH (ref 70–99)
Potassium: 3.8 mEq/L (ref 3.5–5.1)
Sodium: 137 mEq/L (ref 135–145)

## 2021-05-21 LAB — BRAIN NATRIURETIC PEPTIDE: Pro B Natriuretic peptide (BNP): 15 pg/mL (ref 0.0–100.0)

## 2021-05-21 MED ORDER — BENZONATATE 200 MG PO CAPS: 200.0000 mg | ORAL_CAPSULE | Freq: Three times a day (TID) | ORAL | 1 refills | Status: DC | PRN

## 2021-05-21 MED ORDER — ALBUTEROL SULFATE (2.5 MG/3ML) 0.083% IN NEBU: 2.5000 mg | INHALATION_SOLUTION | Freq: Four times a day (QID) | RESPIRATORY_TRACT | 5 refills | Status: DC | PRN

## 2021-05-21 NOTE — Assessment & Plan Note (Signed)
Resolving COPD exacerbation.  Patient is finished up his recent steroid.  Patient has had several urgent care visits over the last 6 months.  We will check labs including CBC be met and BNP. Recent chest x-ray without acute process noted. We will add an albuterol nebulizer as needed.  Continue on triple maintenance inhaler with Trelegy. Cough control with Mucinex, Delsym and Tessalon.  Plan  Patient Instructions  Finish Prednisone taper as directed.  Mucinex Twice daily  As needed  cough/congestion  Delsym 2 tsp Twice daily  for cough, as needed  Tessalon 247m Three times a day  for cough as needed  Continue Trelegy inhaler 1 puff daily , rinse after use.  Albuterol inhaler As needed   Add Albuterol Neb every 6hr as needed for wheezing /shortness of breath.  Labs today .  Follow up with Dr. BLamonte Sakai or Alyha Marines NP in 3 months and As needed

## 2021-05-21 NOTE — Patient Instructions (Addendum)
Finish Prednisone taper as directed.  Mucinex Twice daily  As needed  cough/congestion  Delsym 2 tsp Twice daily  for cough, as needed  Tessalon 200mg  Three times a day  for cough as needed  Continue Trelegy inhaler 1 puff daily , rinse after use.  Albuterol inhaler As needed   Add Albuterol Neb every 6hr as needed for wheezing /shortness of breath.  Labs today .  Follow up with Dr. Lamonte Sakai  or Madeleyn Schwimmer NP in 3 months and As needed

## 2021-05-21 NOTE — Progress Notes (Signed)
$'@Patient'H$  ID: George Watson, male    DOB: 12/23/1945, 75 y.o.   MRN: [id number]  Chief Complaint  Patient presents with   Follow-up    Referring provider: [place], Blondell Reveal, MD  HPI: 75 year old male former smoker seen for pulmonary consult December 28, 2019 to establish for COPD Hx TB, took meds for 1 year 52.  Hx of polysubstance abuse with IV drug use, clean since age 66 .    TEST/EVENTS :  July 2021 PFT showed FEV1 78%, ratio 77, FVC 75%.  No significant bronchodilator response.  Mid flow reversibility.  (Patient did take Trelegy inhaler prior to PFT) 2016 FEV1 53%. CT chest July 10, 2020 showed biapical pleural and parenchymal scarring and bullous changes. CT angio chest aorta December 2021 new nodular changes in the right upper lobe and left lower lobe   05/21/2021 Follow up : COPD  Patient returns for a 2-month follow-up.  Patient has underlying mild to moderate COPD.  Patient complains over the last month he has had recurring cough, congestion and increased shortness of breath.  He has been to the emergency room twice in the last month.  He was initially treated with a Z-Pak and prednisone taper.  And again last week treated with doxycycline and a prednisone taper.  Patient says he is feeling he is getting better. Just caught a bug this past month he could not shake. Has few days left on steroids .  Patient has had several exacerbations over the last 6 months.  Patient endorses compliance on Trelegy inhaler daily.  Chest x-ray April 23, 2021 showed chronic interstitial markings otherwise no acute process was noted.  CT chest October 03, 2020 showed stable mild emphysematous changes.  Stable biapical pleural-parenchymal scarring.  And some mild residual post infectious scarring. Has been using delsym for cough with some relief. Good appetite ,. No n/v/d.      Allergies  Allergen Reactions   Amlodipine Other (See Comments)    Leg cramping     Immunization History   Administered Date(s) Administered   Fluad Quad(high Dose 65+) 03/29/2019   Hepatitis A 02/18/1998, 08/21/1998   Influenza Split 04/01/2011, 05/18/2012   Influenza, High Dose Seasonal PF 04/23/2016, 03/17/2018   Influenza, Seasonal, Injecte, Preservative Fre 05/01/2017   Influenza,inj,Quad PF,6+ Mos 04/28/2013, 04/21/2014   Influenza-Unspecified 04/21/2015, 05/21/2020   PFIZER(Purple Top)SARS-COV-2 Vaccination 08/10/2019, 08/31/2019, 05/21/2020, 11/19/2020   Pneumococcal Conjugate-13 08/22/2014   Pneumococcal Polysaccharide-23 07/21/2002, 07/08/2016   Tdap 11/18/2005, 01/03/2015    Past Medical History:  Diagnosis Date   Aortic aneurysm (HCC)    Arthritis    BPH (benign prostatic hypertrophy)    Cirrhosis (HCC)    COPD (chronic obstructive pulmonary disease) (HCC)    DDD (degenerative disc disease), lumbosacral    Elevated PSA    Emphysema of lung ([place])    Essential hypertension, benign    diet controlled   Fracture of shaft of clavicle    GERD (gastroesophageal reflux disease)    H/O drug abuse ([place])    multisubstance   Hepatitis B    Hepatitis C 10/1996   HSV-2 (herpes simplex virus 2) infection    Hx of biopsy 08/2004   liver   Hx of colonic polyps 2022   Hyperlipidemia    Inguinal hernia    right   Other and unspecified hyperlipidemia    Prostate cancer ([place])    Substance abuse ([place])    Tuberculosis     Tobacco History: Social History  Tobacco Use  Smoking Status Former   Years: 43.00   Types: Cigarettes   Quit date: 07/21/2002   Years since quitting: 18.8  Smokeless Tobacco Never   Counseling given: Not Answered   Outpatient Medications Prior to Visit  Medication Sig Dispense Refill   albuterol (VENTOLIN HFA) 108 (90 Base) MCG/ACT inhaler Inhale 2 puffs into the lungs every 6 (six) hours as needed for shortness of breath. 18 g 1   aspirin EC 81 MG EC tablet Take 1 tablet (81 mg total) by mouth daily.     B Complex Vitamins (B COMPLEX PO) Take 1  tablet by mouth daily.     cholecalciferol (VITAMIN D3) 25 MCG (1000 UT) tablet Take 1,000 Units by mouth daily.     Coenzyme Q10-Vitamin E 100-300 MG-UNIT CHEW Chew 1 tablet by mouth daily.     diazepam (VALIUM) 2 MG tablet Take 1-2 tabs 30 minutes before flight. 10 tablet 0   dutasteride (AVODART) 0.5 MG capsule Take 0.5 mg by mouth daily.     famotidine (PEPCID) 20 MG tablet Take 20 mg by mouth every evening.      glucosamine-chondroitin 500-400 MG tablet Take 3 tablets by mouth daily. (Patient taking differently: Take 2 tablets by mouth daily.)     hydrochlorothiazide (MICROZIDE) 12.5 MG capsule Take 1 capsule (12.5 mg total) by mouth daily. 90 capsule 3   losartan (COZAAR) 100 MG tablet TAKE 1 TABLET BY MOUTH EVERY DAY 90 tablet 1   metaxalone (SKELAXIN) 800 MG tablet Take 1 tablet (800 mg total) by mouth 3 (three) times daily as needed for muscle spasms. 30 tablet 0   Multiple Vitamins-Minerals (CENTRUM SILVER PO) Take 1 tablet by mouth daily.     mupirocin cream (BACTROBAN) 2 % Apply 1 application topically 2 (two) times daily. 30 g 1   Omega-3 Fatty Acids (FISH OIL PO) Take 1 tablet by mouth daily.     rosuvastatin (CRESTOR) 5 MG tablet Take 1 tablet (5 mg total) by mouth daily. 90 tablet 0   tamsulosin (FLOMAX) 0.4 MG CAPS capsule Take 0.4 mg by mouth daily.      TRELEGY ELLIPTA 100-62.5-25 MCG/INH AEPB TAKE 1 PUFF BY MOUTH EVERY DAY 60 each 5   TURMERIC CURCUMIN PO Take 1 tablet by mouth daily.     azithromycin (ZITHROMAX) 250 MG tablet Take 1 tablet (250 mg total) by mouth daily. Take first 2 tablets together, then 1 every day until finished. 6 tablet 0   doxycycline (VIBRAMYCIN) 100 MG capsule Take 1 capsule (100 mg total) by mouth 2 (two) times daily. 14 capsule 0   predniSONE (DELTASONE) 10 MG tablet Take 6 tabs daily x 2 days, 5 tabs daily x 2 days, 4 tabs daily x 2 days, etc 42 tablet 0   diphenoxylate-atropine (LOMOTIL) 2.5-0.025 MG tablet Take by mouth See admin instructions. 2  tablets at the first sign of loose stool, then 1 tablet after each loose stool. Not to exceed 8 tabs per day (Patient not taking: Reported on 05/21/2021)     No facility-administered medications prior to visit.     Review of Systems:   Constitutional:   No  weight loss, night sweats,  Fevers, chills,  +fatigue, or  lassitude.  HEENT:   No headaches,  Difficulty swallowing,  Tooth/dental problems, or  Sore throat,                No sneezing, itching, ear ache, nasal congestion, post nasal drip,  CV:  No chest pain,  Orthopnea, PND, swelling in lower extremities, anasarca, dizziness, palpitations, syncope.   GI  No heartburn, indigestion, abdominal pain, nausea, vomiting, diarrhea, change in bowel habits, loss of appetite, bloody stools.   Resp:  No chest wall deformity  Skin: no rash or lesions.  GU: no dysuria, change in color of urine, no urgency or frequency.  No flank pain, no hematuria   MS:  No joint pain or swelling.  No decreased range of motion.  No back pain.    Physical Exam  BP 138/76 (BP Location: Left Arm, Cuff Size: Normal)   Pulse 87   Ht $R'5\' 11"'qO$  (1.803 m)   Wt 170 lb (77.1 kg)   SpO2 94%   BMI 23.71 kg/m   GEN: A/Ox3; pleasant , NAD, well nourished    HEENT:  [place]/AT,   NOSE-clear, THROAT-clear, no lesions, no postnasal drip or exudate noted.   NECK:  Supple w/ fair ROM; no JVD; normal carotid impulses w/o bruits; no thyromegaly or nodules palpated; no lymphadenopathy.    RESP  few trace rhonchi  no accessory muscle use, no dullness to percussion  CARD:  RRR, no m/r/g, no peripheral edema, pulses intact, no cyanosis or clubbing.  GI:   Soft & nt; nml bowel sounds; no organomegaly or masses detected.   Musco: Warm bil, no deformities or joint swelling noted.   Neuro: alert, no focal deficits noted.    Skin: Warm, no lesions or rashes    Lab Results:    BNP No results found for: BNP  ProBNP No results found for: PROBNP  Imaging: DG Chest  2 View  Result Date: 04/23/2021 CLINICAL DATA:  75 year old male with shortness of breath, cough, congestion and fever for 2 days. Former smoker. EXAM: CHEST - 2 VIEW COMPARISON:  Chest CT 10/03/2020 and earlier. FINDINGS: PA and lateral views. Lung volumes, mediastinal contours, and lung markings appear stable since 2019. Chronically increased interstitium appears stable. No pneumothorax, pulmonary edema, pleural effusion or acute pulmonary opacity. Calcified aortic atherosclerosis. Visualized tracheal air column is within normal limits. Prior cervical ACDF. No acute osseous abnormality identified. Negative visible bowel gas pattern. IMPRESSION: No acute cardiopulmonary abnormality. Electronically Signed   By: Genevie Ann M.D.   On: 04/23/2021 12:02      PFT Results Latest Ref Rng & Units 02/13/2020  FVC-Pre L 3.44  FVC-Predicted Pre % 73  FVC-Post L 3.54  FVC-Predicted Post % 75  Pre FEV1/FVC % % 76  Post FEV1/FCV % % 77  FEV1-Pre L 2.63  FEV1-Predicted Pre % 76  FEV1-Post L 2.72  DLCO uncorrected ml/min/mmHg 25.56  DLCO UNC% % 93  DLCO corrected ml/min/mmHg 25.56  DLCO COR %Predicted % 93  DLVA Predicted % 109  TLC L 6.11  TLC % Predicted % 82  RV % Predicted % 99    No results found for: NITRICOXIDE      Assessment & Plan:   COPD (chronic obstructive pulmonary disease) Resolving COPD exacerbation.  Patient is finished up his recent steroid.  Patient has had several urgent care visits over the last 6 months.  We will check labs including CBC be met and BNP. Recent chest x-ray without acute process noted. We will add an albuterol nebulizer as needed.  Continue on triple maintenance inhaler with Trelegy. Cough control with Mucinex, Delsym and Tessalon.  Plan  Patient Instructions  Finish Prednisone taper as directed.  Mucinex Twice daily  As needed  cough/congestion  Delsym 2  tsp Twice daily  for cough, as needed  Tessalon $RemoveB'200mg'xYTODgff$  Three times a day  for cough as needed   Continue Trelegy inhaler 1 puff daily , rinse after use.  Albuterol inhaler As needed   Add Albuterol Neb every 6hr as needed for wheezing /shortness of breath.  Labs today .  Follow up with Dr. Lamonte Sakai  or Aamya Orellana NP in 3 months and As needed        I spent   31 minutes dedicated to the care of this patient on the date of this encounter to include pre-visit review of records, face-to-face time with the patient discussing conditions above, post visit ordering of testing, clinical documentation with the electronic health record, making appropriate referrals as documented, and communicating necessary findings to members of the patients care team.     Rexene Edison, NP 05/21/2021

## 2021-05-22 DIAGNOSIS — J441 Chronic obstructive pulmonary disease with (acute) exacerbation: Secondary | ICD-10-CM | POA: Diagnosis not present

## 2021-05-27 DIAGNOSIS — S39012A Strain of muscle, fascia and tendon of lower back, initial encounter: Secondary | ICD-10-CM | POA: Diagnosis not present

## 2021-05-27 DIAGNOSIS — M9901 Segmental and somatic dysfunction of cervical region: Secondary | ICD-10-CM | POA: Diagnosis not present

## 2021-05-27 DIAGNOSIS — M9903 Segmental and somatic dysfunction of lumbar region: Secondary | ICD-10-CM | POA: Diagnosis not present

## 2021-05-27 DIAGNOSIS — M47817 Spondylosis without myelopathy or radiculopathy, lumbosacral region: Secondary | ICD-10-CM | POA: Diagnosis not present

## 2021-05-27 DIAGNOSIS — M47812 Spondylosis without myelopathy or radiculopathy, cervical region: Secondary | ICD-10-CM | POA: Diagnosis not present

## 2021-05-27 DIAGNOSIS — M9902 Segmental and somatic dysfunction of thoracic region: Secondary | ICD-10-CM | POA: Diagnosis not present

## 2021-05-29 ENCOUNTER — Other Ambulatory Visit: Payer: Self-pay | Admitting: Adult Health

## 2021-05-29 MED ORDER — ALBUTEROL SULFATE (2.5 MG/3ML) 0.083% IN NEBU: 2.5000 mg | INHALATION_SOLUTION | Freq: Four times a day (QID) | RESPIRATORY_TRACT | 5 refills | Status: DC | PRN

## 2021-05-30 DIAGNOSIS — J441 Chronic obstructive pulmonary disease with (acute) exacerbation: Secondary | ICD-10-CM | POA: Diagnosis not present

## 2021-06-05 DIAGNOSIS — L718 Other rosacea: Secondary | ICD-10-CM | POA: Diagnosis not present

## 2021-06-05 DIAGNOSIS — L570 Actinic keratosis: Secondary | ICD-10-CM | POA: Diagnosis not present

## 2021-06-19 ENCOUNTER — Encounter (HOSPITAL_BASED_OUTPATIENT_CLINIC_OR_DEPARTMENT_OTHER): Payer: Self-pay | Admitting: Family Medicine

## 2021-06-24 ENCOUNTER — Encounter: Payer: Self-pay | Admitting: Gastroenterology

## 2021-06-24 ENCOUNTER — Other Ambulatory Visit: Payer: Self-pay | Admitting: Emergency Medicine

## 2021-06-25 ENCOUNTER — Other Ambulatory Visit (HOSPITAL_BASED_OUTPATIENT_CLINIC_OR_DEPARTMENT_OTHER): Payer: Self-pay | Admitting: Family Medicine

## 2021-06-25 DIAGNOSIS — M9903 Segmental and somatic dysfunction of lumbar region: Secondary | ICD-10-CM | POA: Diagnosis not present

## 2021-06-25 DIAGNOSIS — M9901 Segmental and somatic dysfunction of cervical region: Secondary | ICD-10-CM | POA: Diagnosis not present

## 2021-06-25 DIAGNOSIS — M47812 Spondylosis without myelopathy or radiculopathy, cervical region: Secondary | ICD-10-CM | POA: Diagnosis not present

## 2021-06-25 DIAGNOSIS — M9902 Segmental and somatic dysfunction of thoracic region: Secondary | ICD-10-CM | POA: Diagnosis not present

## 2021-06-25 DIAGNOSIS — S39012A Strain of muscle, fascia and tendon of lower back, initial encounter: Secondary | ICD-10-CM | POA: Diagnosis not present

## 2021-06-25 DIAGNOSIS — M47817 Spondylosis without myelopathy or radiculopathy, lumbosacral region: Secondary | ICD-10-CM | POA: Diagnosis not present

## 2021-06-25 MED ORDER — FLUTICASONE-UMECLIDIN-VILANT 100-62.5-25 MCG/ACT IN AEPB: INHALATION_SPRAY | RESPIRATORY_TRACT | 5 refills | Status: DC

## 2021-06-27 ENCOUNTER — Other Ambulatory Visit: Payer: Self-pay | Admitting: Surgery

## 2021-06-27 DIAGNOSIS — I7121 Aneurysm of the ascending aorta, without rupture: Secondary | ICD-10-CM

## 2021-06-29 DIAGNOSIS — J441 Chronic obstructive pulmonary disease with (acute) exacerbation: Secondary | ICD-10-CM | POA: Diagnosis not present

## 2021-07-02 DIAGNOSIS — Z8546 Personal history of malignant neoplasm of prostate: Secondary | ICD-10-CM | POA: Diagnosis not present

## 2021-07-07 ENCOUNTER — Encounter: Payer: Self-pay | Admitting: Gastroenterology

## 2021-07-07 DIAGNOSIS — K7460 Unspecified cirrhosis of liver: Secondary | ICD-10-CM

## 2021-07-08 ENCOUNTER — Encounter (HOSPITAL_BASED_OUTPATIENT_CLINIC_OR_DEPARTMENT_OTHER): Payer: Self-pay | Admitting: Family Medicine

## 2021-07-08 ENCOUNTER — Other Ambulatory Visit: Payer: Self-pay

## 2021-07-08 ENCOUNTER — Ambulatory Visit (INDEPENDENT_AMBULATORY_CARE_PROVIDER_SITE_OTHER): Payer: PPO | Admitting: Family Medicine

## 2021-07-08 VITALS — BP 140/70 | HR 68 | Ht 71.0 in | Wt 173.5 lb

## 2021-07-08 DIAGNOSIS — I10 Essential (primary) hypertension: Secondary | ICD-10-CM | POA: Diagnosis not present

## 2021-07-08 DIAGNOSIS — E785 Hyperlipidemia, unspecified: Secondary | ICD-10-CM | POA: Diagnosis not present

## 2021-07-08 DIAGNOSIS — R7303 Prediabetes: Secondary | ICD-10-CM | POA: Diagnosis not present

## 2021-07-08 MED ORDER — ZOSTER VAC RECOMB ADJUVANTED 50 MCG/0.5ML IM SUSR
0.5000 mL | Freq: Once | INTRAMUSCULAR | 0 refills | Status: AC
Start: 2021-07-08 — End: 2021-07-08

## 2021-07-08 NOTE — Assessment & Plan Note (Signed)
Has been tolerating rosuvastatin, occasionally has some leg cramping Check lipid panel today

## 2021-07-08 NOTE — Progress Notes (Signed)
° ° °  Procedures performed today:    None.  Independent interpretation of notes and tests performed by another provider:   None.  Brief History, Exam, Impression, and Recommendations:    BP 140/70    Pulse 68    Ht 5\' 11"  (1.803 m)    Wt 173 lb 8 oz (78.7 kg)    SpO2 98%    BMI 24.20 kg/m   Essential hypertension, benign Has been doing well, blood pressure at goal in office today, has not been checking blood pressure at home Continue with losartan and hydrochlorothiazide Check labs today including CBC, CMP, lipid panel, A1c  Hyperlipidemia with target LDL less than 70 Has been tolerating rosuvastatin, occasionally has some leg cramping Check lipid panel today  Prediabetes Has been managing with lifestyle modification We will check A1c today to monitor status  Plan for follow-up in about 2 months to complete CPE Completed flu shot at [facility] this flu season Has some interest in shingles vaccine, prescription provided today    ___________________________________________ George Oyola de [place], MD, ABFM, CAQSM [facility] [place]

## 2021-07-08 NOTE — Assessment & Plan Note (Signed)
Has been managing with lifestyle modification We will check A1c today to monitor status

## 2021-07-08 NOTE — Patient Instructions (Signed)
°  Medication Instructions:  Your physician recommends that you continue on your current medications as directed. Please refer to the Current Medication list given to you today. --If you need a refill on any your medications before your next appointment, please call your pharmacy first. If no refills are authorized on file call the office.-- Lab Work: Your physician has recommended that you have lab work today: CBC, CMP, Lipid, and A1C If you have labs (blood work) drawn today and your tests are completely normal, you will receive your results via [facility] a phone call from our staff.  Please ensure you check your voicemail in the event that you authorized detailed messages to be left on a delegated number. If you have any lab test that is abnormal or we need to change your treatment, we will call you to review the results.  Follow-Up: Your next appointment:   Your physician recommends that you schedule a follow-up appointment in: 2 MONTHS for a CPE with Dr. de [place]  You will receive a text message or e-mail with a link to a survey about your care and experience with [place] today! We would greatly appreciate your feedback!   Thanks for letting us be apart of your health journey!!  Primary Care and Sports Medicine   Dr. Arlina Robes [place]   We encourage you to activate your patient portal called "MyChart".  Sign up information is provided on this After Visit Summary.  MyChart is used to connect with patients for Virtual Visits (Telemedicine).  Patients are able to view lab/test results, encounter notes, upcoming appointments, etc.  Non-urgent messages can be sent to your provider as well. To learn more about what you can do with MyChart, please visit --  [email or web address].

## 2021-07-08 NOTE — Assessment & Plan Note (Signed)
Has been doing well, blood pressure at goal in office today, has not been checking blood pressure at home Continue with losartan and hydrochlorothiazide Check labs today including CBC, CMP, lipid panel, A1c

## 2021-07-09 ENCOUNTER — Encounter (HOSPITAL_BASED_OUTPATIENT_CLINIC_OR_DEPARTMENT_OTHER): Payer: Self-pay | Admitting: Family Medicine

## 2021-07-09 LAB — COMPREHENSIVE METABOLIC PANEL
ALT: 28 IU/L (ref 0–44)
AST: 26 IU/L (ref 0–40)
Albumin/Globulin Ratio: 2 (ref 1.2–2.2)
Albumin: 4.7 g/dL (ref 3.7–4.7)
Alkaline Phosphatase: 57 IU/L (ref 44–121)
BUN/Creatinine Ratio: 20 (ref 10–24)
BUN: 19 mg/dL (ref 8–27)
Bilirubin Total: 0.5 mg/dL (ref 0.0–1.2)
CO2: 24 mmol/L (ref 20–29)
Calcium: 10.5 mg/dL — ABNORMAL HIGH (ref 8.6–10.2)
Chloride: 102 mmol/L (ref 96–106)
Creatinine, Ser: 0.96 mg/dL (ref 0.76–1.27)
Globulin, Total: 2.4 g/dL (ref 1.5–4.5)
Glucose: 102 mg/dL — ABNORMAL HIGH (ref 70–99)
Potassium: 4.6 mmol/L (ref 3.5–5.2)
Sodium: 141 mmol/L (ref 134–144)
Total Protein: 7.1 g/dL (ref 6.0–8.5)
eGFR: 83 mL/min/{1.73_m2} (ref >59)

## 2021-07-09 LAB — LIPID PANEL
Chol/HDL Ratio: 5.1 ratio — ABNORMAL HIGH (ref 0.0–5.0)
Cholesterol, Total: 154 mg/dL (ref 100–199)
HDL: 30 mg/dL — ABNORMAL LOW (ref >39)
LDL Chol Calc (NIH): 80 mg/dL (ref 0–99)
Triglycerides: 269 mg/dL — ABNORMAL HIGH (ref 0–149)
VLDL Cholesterol Cal: 44 mg/dL — ABNORMAL HIGH (ref 5–40)

## 2021-07-09 LAB — CBC WITH DIFFERENTIAL/PLATELET
Basophils Absolute: 0.1 10*3/uL (ref 0.0–0.2)
Basos: 1 %
EOS (ABSOLUTE): 0.8 10*3/uL — ABNORMAL HIGH (ref 0.0–0.4)
Eos: 7 %
Hematocrit: 46.8 % (ref 37.5–51.0)
Hemoglobin: 15.9 g/dL (ref 13.0–17.7)
Immature Grans (Abs): 0 10*3/uL (ref 0.0–0.1)
Immature Granulocytes: 0 %
Lymphocytes Absolute: 2.7 10*3/uL (ref 0.7–3.1)
Lymphs: 25 %
MCH: 31.1 pg (ref 26.6–33.0)
MCHC: 34 g/dL (ref 31.5–35.7)
MCV: 91 fL (ref 79–97)
Monocytes Absolute: 1.4 10*3/uL — ABNORMAL HIGH (ref 0.1–0.9)
Monocytes: 13 %
Neutrophils Absolute: 5.6 10*3/uL (ref 1.4–7.0)
Neutrophils: 54 %
Platelets: 268 10*3/uL (ref 150–450)
RBC: 5.12 x10E6/uL (ref 4.14–5.80)
RDW: 12.2 % (ref 11.6–15.4)
WBC: 10.7 10*3/uL (ref 3.4–10.8)

## 2021-07-09 LAB — HEMOGLOBIN A1C
Est. average glucose Bld gHb Est-mCnc: 128 mg/dL
Hgb A1c MFr Bld: 6.1 % — ABNORMAL HIGH (ref 4.8–5.6)

## 2021-07-09 NOTE — Telephone Encounter (Signed)
Lab orders in epic. Pt had CBC and CMET yesterday to include normal LFT's.   RUQ [place] order in epic. Secure staff message sent to radiology scheduling to contact patient to set up appt.

## 2021-07-10 ENCOUNTER — Ambulatory Visit (HOSPITAL_BASED_OUTPATIENT_CLINIC_OR_DEPARTMENT_OTHER)
Admission: RE | Admit: 2021-07-10 | Discharge: 2021-07-10 | Disposition: A | Discharge status: Home / Self Care | Payer: PPO | Source: Ambulatory Visit | Attending: Gastroenterology | Admitting: Gastroenterology

## 2021-07-10 ENCOUNTER — Other Ambulatory Visit: Payer: Self-pay

## 2021-07-10 ENCOUNTER — Ambulatory Visit (HOSPITAL_BASED_OUTPATIENT_CLINIC_OR_DEPARTMENT_OTHER): Payer: PPO

## 2021-07-10 DIAGNOSIS — K7460 Unspecified cirrhosis of liver: Secondary | ICD-10-CM | POA: Diagnosis not present

## 2021-07-15 ENCOUNTER — Ambulatory Visit (HOSPITAL_COMMUNITY)
Admission: EM | Admit: 2021-07-15 | Discharge: 2021-07-15 | Disposition: A | Discharge status: Home / Self Care | Payer: PPO | Attending: Physician Assistant | Admitting: Physician Assistant

## 2021-07-15 ENCOUNTER — Other Ambulatory Visit: Payer: Self-pay

## 2021-07-15 ENCOUNTER — Encounter (HOSPITAL_COMMUNITY): Payer: Self-pay

## 2021-07-15 DIAGNOSIS — R051 Acute cough: Secondary | ICD-10-CM | POA: Diagnosis not present

## 2021-07-15 DIAGNOSIS — Z87891 Personal history of nicotine dependence: Secondary | ICD-10-CM | POA: Diagnosis not present

## 2021-07-15 DIAGNOSIS — Z7951 Long term (current) use of inhaled steroids: Secondary | ICD-10-CM | POA: Insufficient documentation

## 2021-07-15 DIAGNOSIS — Z20822 Contact with and (suspected) exposure to covid-19: Secondary | ICD-10-CM | POA: Diagnosis not present

## 2021-07-15 DIAGNOSIS — J441 Chronic obstructive pulmonary disease with (acute) exacerbation: Secondary | ICD-10-CM | POA: Diagnosis not present

## 2021-07-15 DIAGNOSIS — R0602 Shortness of breath: Secondary | ICD-10-CM | POA: Diagnosis not present

## 2021-07-15 LAB — POC INFLUENZA A AND B ANTIGEN (URGENT CARE ONLY)
INFLUENZA A ANTIGEN, POC: NEGATIVE
INFLUENZA B ANTIGEN, POC: NEGATIVE

## 2021-07-15 MED ORDER — AMOXICILLIN-POT CLAVULANATE 875-125 MG PO TABS: 1.0000 | ORAL_TABLET | Freq: Two times a day (BID) | ORAL | 0 refills | Status: DC

## 2021-07-15 MED ORDER — PREDNISONE 10 MG (21) PO TBPK: ORAL_TABLET | Freq: Every day | ORAL | 0 refills | Status: DC

## 2021-07-15 MED ORDER — PREDNISONE 10 MG (21) PO TBPK: ORAL_TABLET | ORAL | 0 refills | Status: DC

## 2021-07-15 NOTE — ED Triage Notes (Signed)
Pt states he has emphysema and feels like he is having an exacerbation come on.  Noticed SOB primarily with activity last night.  Worse today.  Also slight ear ache (L) last few days.  Slight cough and body aches, no fever.  No known exposure.   Denies CP, dizziness.

## 2021-07-15 NOTE — ED Provider Notes (Signed)
[facility]    CSN: [id number] Arrival date & time: 07/15/21  1242      History   Chief Complaint Chief Complaint  Patient presents with   Shortness of Breath    HPI George Watson is a 75 y.o. male.   Patient presents today with a 2-day history of increasing shortness of breath and cough.  He has a history of COPD and states current symptoms are similar to previous exacerbations.  He was last treated for an exacerbation October 2022 with doxycycline and prednisone and reports resolution of symptoms with this medication.  He is prescribed albuterol as well as Trelegy and has been using medication as prescribed.  He is a former smoker but quit 18 years ago.  He has had influenza and COVID-19 vaccines.  Denies any known sick contacts.  He has been using over-the-counter medication without improvement of symptoms.  He denies any recent influenza or COVID-19 illnesses.   Past Medical History:  Diagnosis Date   Aortic aneurysm (HCC)    Arthritis    BPH (benign prostatic hypertrophy)    Cirrhosis (HCC)    COPD (chronic obstructive pulmonary disease) (HCC)    DDD (degenerative disc disease), lumbosacral    Elevated PSA    Emphysema of lung (HCC)    Essential hypertension, benign    diet controlled   Fracture of shaft of clavicle    GERD (gastroesophageal reflux disease)    H/O drug abuse ([place])    multisubstance   Hepatitis B    Hepatitis C 10/1996   HSV-2 (herpes simplex virus 2) infection    Hx of biopsy 08/2004   liver   Hx of colonic polyps 2022   Hyperlipidemia    Inguinal hernia    right   Other and unspecified hyperlipidemia    Prostate cancer ([place])    Substance abuse ([place])    Tuberculosis     Patient Active Problem List   Diagnosis Date Noted   Lung nodule 10/05/2020   Gastroenteritis 05/08/2020   TIA (transient ischemic attack) 07/07/2019   Dilated aortic root ([place]) 06/13/2019   Pure hypertriglyceridemia 04/08/2019   Fear of flying 02/11/2018    Tear of right rotator cuff 08/31/2017   Prediabetes 01/06/2017   Atherosclerosis of aorta ([place]) 07/09/2016   Degeneration of lumbar or lumbosacral intervertebral disc 02/15/2014   Essential hypertension, benign    Hyperlipidemia with target LDL less than 70    HSV-2 (herpes simplex virus 2) infection    COPD (chronic obstructive pulmonary disease) (HCC)    Benign prostatic hyperplasia    DDD (degenerative disc disease), lumbosacral    Prostate cancer ([place])    Hepatitis C, chronic ([place]) 10/19/1996    Past Surgical History:  Procedure Laterality Date   BACK SURGERY     CERVICAL SPINE SURGERY     fracture ribs     3   HERNIA REPAIR     INGUINAL HERNIA REPAIR  07/01/2012   Procedure: HERNIA REPAIR INGUINAL ADULT;  Surgeon: Madilyn Hook, DO;  Location: [facility];  Service: General;  Laterality: Right;  with Mesh   left knee meniscus repair  07/21/1990   right rotator cuff  07/21/1990   right shoulder arthroscopy  07/21/2009       Home Medications    Prior to Admission medications   Medication Sig Start Date End Date Taking? Authorizing Provider  amoxicillin-clavulanate (AUGMENTIN) 875-125 MG tablet Take 1 tablet by mouth every 12 (twelve) hours. 07/15/21  Yes  Lilla Callejo K, PA-C  predniSONE (STERAPRED UNI-PAK 21 TAB) 10 MG (21) TBPK tablet Take by mouth daily. Take 6 tabs by mouth daily  for 2 days, then 5 tabs for 2 days, then 4 tabs for 2 days, then 3 tabs for 2 days, 2 tabs for 2 days, then 1 tab by mouth daily for 2 days 07/15/21  Yes Dajion Bickford K, PA-C  albuterol (PROVENTIL) (2.5 MG/3ML) 0.083% nebulizer solution Take 3 mLs (2.5 mg total) by nebulization every 6 (six) hours as needed for wheezing or shortness of breath. 05/29/21 05/29/22  Parrett, Fonnie Mu, NP  albuterol (VENTOLIN HFA) 108 (90 Base) MCG/ACT inhaler Inhale 2 puffs into the lungs every 6 (six) hours as needed for shortness of breath. 02/19/21   de [place], Raymond J, MD  aspirin EC 81 MG EC tablet Take 1 tablet (81 mg  total) by mouth daily. 07/09/19   Black, Lezlie Octave, NP  B Complex Vitamins (B COMPLEX PO) Take 1 tablet by mouth daily.    [provider]  benzonatate (TESSALON) 200 MG capsule Take 1 capsule (200 mg total) by mouth 3 (three) times daily as needed for cough. 05/21/21 05/21/22  Parrett, Fonnie Mu, NP  cholecalciferol (VITAMIN D3) 25 MCG (1000 UT) tablet Take 1,000 Units by mouth daily.    [provider]  Coenzyme Q10-Vitamin E 100-300 MG-UNIT CHEW Chew 1 tablet by mouth daily.    [provider]  diazepam (VALIUM) 2 MG tablet Take 1-2 tabs 30 minutes before flight. 11/05/20   [name], MD  dutasteride (AVODART) 0.5 MG capsule Take 0.5 mg by mouth daily.    [provider]  famotidine (PEPCID) 20 MG tablet Take 20 mg by mouth every evening.     [provider]  Fluticasone-Umeclidin-Vilant (TRELEGY ELLIPTA) 100-62.5-25 MCG/ACT AEPB TAKE 1 PUFF BY MOUTH EVERY DAY 06/25/21   Collene Gobble, MD  glucosamine-chondroitin 500-400 MG tablet Take 3 tablets by mouth daily. Patient taking differently: Take 2 tablets by mouth daily. 02/15/18   Marrian Salvage, FNP  hydrochlorothiazide (MICROZIDE) 12.5 MG capsule Take 1 capsule (12.5 mg total) by mouth daily. 11/23/20   [name], MD  losartan (COZAAR) 100 MG tablet TAKE 1 TABLET BY MOUTH EVERY DAY 02/20/21   de [place], Blondell Reveal, MD  metaxalone (SKELAXIN) 800 MG tablet Take 1 tablet (800 mg total) by mouth 3 (three) times daily as needed for muscle spasms. 11/05/20   [name], MD  Multiple Vitamins-Minerals (CENTRUM SILVER PO) Take 1 tablet by mouth daily.    [provider]  mupirocin cream (BACTROBAN) 2 % Apply 1 application topically 2 (two) times daily. 04/15/20   [name], MD  Omega-3 Fatty Acids (FISH OIL PO) Take 1 tablet by mouth daily.    [provider]  rosuvastatin (CRESTOR) 5 MG tablet TAKE 1 TABLET (5 MG TOTAL) BY MOUTH DAILY. 07/01/21   de [place], Blondell Reveal, MD   tamsulosin (FLOMAX) 0.4 MG CAPS capsule Take 0.4 mg by mouth daily.     [provider]  TURMERIC CURCUMIN PO Take 1 tablet by mouth daily.    [provider]    Family History Family History  Problem Relation Age of Onset   Breast cancer Sister    Leukemia Mother    Throat cancer Father    Esophageal cancer Father    Prostate cancer Brother    Melanoma Brother    Colon cancer Neg Hx    Stomach cancer Neg  Hx    Pancreatic cancer Neg Hx    Liver disease Neg Hx     Social History Social History   Tobacco Use   Smoking status: Former    Years: 43.00    Types: Cigarettes    Quit date: 07/21/2002    Years since quitting: 18.9   Smokeless tobacco: Never  Vaping Use   Vaping Use: Never used  Substance Use Topics   Alcohol use: No   Drug use: No    Comment: 26 years ago cocaine, heroin. methadone     Allergies   Amlodipine   Review of Systems Review of Systems  Constitutional:  Positive for activity change. Negative for appetite change, fatigue and fever.  HENT:  Negative for congestion, sinus pressure, sneezing and sore throat.   Respiratory:  Positive for cough and shortness of breath. Negative for chest tightness and wheezing.   Cardiovascular:  Negative for chest pain.  Gastrointestinal:  Negative for abdominal pain, diarrhea, nausea and vomiting.  Musculoskeletal:  Positive for arthralgias and myalgias.  Neurological:  Negative for dizziness, light-headedness and headaches.    Physical Exam Triage Vital Signs ED Triage Vitals  Enc Vitals Group     BP 07/15/21 1500 (!) 152/85     Pulse Rate 07/15/21 1500 93     Resp 07/15/21 1500 (!) 22     Temp 07/15/21 1500 98.4 F (36.9 C)     Temp Source 07/15/21 1500 Oral     SpO2 07/15/21 1500 95 %     Weight --      Height --      Head Circumference --      Peak Flow --      Pain Score 07/15/21 1502 0     Pain Loc --      Pain Edu? --      Excl. in [place]? --    No data found.  Updated Vital  Signs BP (!) 152/85 (BP Location: Right Arm)    Pulse 93    Temp 98.4 F (36.9 C) (Oral)    Resp (!) 22    SpO2 95%   Visual Acuity Right Eye Distance:   Left Eye Distance:   Bilateral Distance:    Right Eye Near:   Left Eye Near:    Bilateral Near:     Physical Exam Vitals reviewed.  Constitutional:      General: He is awake.     Appearance: Normal appearance. He is well-developed. He is not ill-appearing.     Comments: Very pleasant male appears stated age in no acute distress sitting comfortably in exam room  HENT:     Head: Normocephalic and atraumatic.     Right Ear: Tympanic membrane, ear canal and external ear normal. Tympanic membrane is not erythematous or bulging.     Left Ear: Tympanic membrane, ear canal and external ear normal. Tympanic membrane is not erythematous or bulging.     Nose: Nose normal.     Mouth/Throat:     Pharynx: Uvula midline. Posterior oropharyngeal erythema present. No oropharyngeal exudate or uvula swelling.  Cardiovascular:     Rate and Rhythm: Normal rate and regular rhythm.     Heart sounds: Normal heart sounds, S1 normal and S2 normal. No murmur heard. Pulmonary:     Effort: Pulmonary effort is normal. No accessory muscle usage or respiratory distress.     Breath sounds: No stridor. Rhonchi present. No wheezing or rales.     Comments: Scattered rhonchi  partially cleared with cough. Abdominal:     General: Bowel sounds are normal.     Palpations: Abdomen is soft.     Tenderness: There is no abdominal tenderness.  Neurological:     Mental Status: He is alert.  Psychiatric:        Behavior: Behavior is cooperative.     UC Treatments / Results  Labs (all labs ordered are listed, but only abnormal results are displayed) Labs Reviewed  SARS CORONAVIRUS 2 (TAT 6-24 HRS)  POC INFLUENZA A AND B ANTIGEN (URGENT CARE ONLY)    EKG   Radiology No results found.  Procedures Procedures (including critical care time)  Medications  Ordered in UC Medications - No data to display  Initial Impression / Assessment and Plan / UC Course  I have reviewed the triage vital signs and the nursing notes.  Pertinent labs & imaging results that were available during my care of the patient were reviewed by me and considered in my medical decision making (see chart for details).     Concern for COPD exacerbation given clinical presentation.  Given patient is only been symptomatic for several days will test for viruses; flu testing was negative and COVID test is pending.  Patient was recently treated with doxycycline but has no risk factors for Pseudomonas so will use Augmentin today.  He was also started on prednisone taper; initially did 1 week of taper but patient reports he often requires 2 weeks to longer taper was sent to pharmacy per his request.  He can use Mucinex, Flonase, Tylenol for additional symptom relief.  Discussed alarm symptoms that warrant emergent evaluation including chest pain, shortness of breath, high fever, worsening cough, nausea/vomiting.  He is to follow-up with our clinic or PCP within a week to ensure symptom improvement.  Strict return precautions given to which he expressed understanding.  Final Clinical Impressions(s) / UC Diagnoses   Final diagnoses:  COPD exacerbation (HCC)  Shortness of breath  Acute cough     Discharge Instructions      Your flu testing was negative.  Your COVID test is pending and we will contact you if this is positive.  I am concerned that you have a COPD exacerbation.  Please start Augmentin twice daily.  Start prednisone taper.  Do not take NSAIDs with the exception of your baby aspirin (additional aspirin, ibuprofen/Advil, naproxen/Aleve) with this medication.  Use Mucinex, Flonase, Tylenol for symptom relief.  Make sure you rest and drink plenty of fluid.  If you have any worsening symptoms including increased cough, fever, increased shortness of breath you need to go to the  emergency room.  Follow-up with your PCP next week assuming improvement of symptoms.     ED Prescriptions     Medication Sig Dispense Auth. Provider   amoxicillin-clavulanate (AUGMENTIN) 875-125 MG tablet Take 1 tablet by mouth every 12 (twelve) hours. 14 tablet Marcellis Frampton K, PA-C   predniSONE (STERAPRED UNI-PAK 21 TAB) 10 MG (21) TBPK tablet  (Status: Discontinued) As directed 21 tablet Zyden Suman K, PA-C   predniSONE (STERAPRED UNI-PAK 21 TAB) 10 MG (21) TBPK tablet Take by mouth daily. Take 6 tabs by mouth daily  for 2 days, then 5 tabs for 2 days, then 4 tabs for 2 days, then 3 tabs for 2 days, 2 tabs for 2 days, then 1 tab by mouth daily for 2 days 42 tablet Darral Rishel K, PA-C      PDMP not reviewed this encounter.  Terrilee Croak, PA-C 07/15/21 [id number]

## 2021-07-15 NOTE — Discharge Instructions (Signed)
Your flu testing was negative.  Your COVID test is pending and we will contact you if this is positive.  I am concerned that you have a COPD exacerbation.  Please start Augmentin twice daily.  Start prednisone taper.  Do not take NSAIDs with the exception of your baby aspirin (additional aspirin, ibuprofen/Advil, naproxen/Aleve) with this medication.  Use Mucinex, Flonase, Tylenol for symptom relief.  Make sure you rest and drink plenty of fluid.  If you have any worsening symptoms including increased cough, fever, increased shortness of breath you need to go to the emergency room.  Follow-up with your PCP next week assuming improvement of symptoms.

## 2021-07-16 ENCOUNTER — Ambulatory Visit (HOSPITAL_COMMUNITY): Payer: Self-pay

## 2021-07-16 LAB — SARS CORONAVIRUS 2 (TAT 6-24 HRS): SARS Coronavirus 2: NEGATIVE

## 2021-07-23 ENCOUNTER — Encounter (HOSPITAL_BASED_OUTPATIENT_CLINIC_OR_DEPARTMENT_OTHER): Payer: Self-pay | Admitting: Family Medicine

## 2021-07-23 DIAGNOSIS — M9903 Segmental and somatic dysfunction of lumbar region: Secondary | ICD-10-CM | POA: Diagnosis not present

## 2021-07-23 DIAGNOSIS — M47817 Spondylosis without myelopathy or radiculopathy, lumbosacral region: Secondary | ICD-10-CM | POA: Diagnosis not present

## 2021-07-23 DIAGNOSIS — M47812 Spondylosis without myelopathy or radiculopathy, cervical region: Secondary | ICD-10-CM | POA: Diagnosis not present

## 2021-07-23 DIAGNOSIS — M9901 Segmental and somatic dysfunction of cervical region: Secondary | ICD-10-CM | POA: Diagnosis not present

## 2021-07-23 DIAGNOSIS — M9902 Segmental and somatic dysfunction of thoracic region: Secondary | ICD-10-CM | POA: Diagnosis not present

## 2021-07-23 DIAGNOSIS — S39012A Strain of muscle, fascia and tendon of lower back, initial encounter: Secondary | ICD-10-CM | POA: Diagnosis not present

## 2021-07-23 MED ORDER — DIAZEPAM 2 MG PO TABS: ORAL_TABLET | ORAL | 0 refills | Status: DC

## 2021-07-25 ENCOUNTER — Encounter: Payer: Self-pay | Admitting: Gastroenterology

## 2021-07-25 MED ORDER — DIPHENOXYLATE-ATROPINE 2.5-0.025 MG PO TABS: 1.0000 | ORAL_TABLET | Freq: Four times a day (QID) | ORAL | 1 refills | Status: DC | PRN

## 2021-07-25 NOTE — Telephone Encounter (Signed)
Called and spoke with Dedra Skeens at [facility]. I gave her a verbal order for Lomotil RX. Pt notified via my chart.

## 2021-07-26 NOTE — Telephone Encounter (Signed)
I called and spoke with Tammy at [facility]. She states that she did not see the order in the system and it may be around there somewhere. I gave her another verbal order for patient's Lomotil prescription. Pt has been notified via my chart.

## 2021-07-30 DIAGNOSIS — J441 Chronic obstructive pulmonary disease with (acute) exacerbation: Secondary | ICD-10-CM | POA: Diagnosis not present

## 2021-08-04 ENCOUNTER — Other Ambulatory Visit (HOSPITAL_BASED_OUTPATIENT_CLINIC_OR_DEPARTMENT_OTHER): Payer: Self-pay | Admitting: Family Medicine

## 2021-08-04 DIAGNOSIS — J418 Mixed simple and mucopurulent chronic bronchitis: Secondary | ICD-10-CM

## 2021-08-04 DIAGNOSIS — I10 Essential (primary) hypertension: Secondary | ICD-10-CM

## 2021-08-12 ENCOUNTER — Ambulatory Visit
Admission: RE | Admit: 2021-08-12 | Discharge: 2021-08-12 | Disposition: A | Discharge status: Home / Self Care | Payer: PPO | Source: Ambulatory Visit | Attending: Surgery | Admitting: Surgery

## 2021-08-12 ENCOUNTER — Ambulatory Visit: Payer: PPO | Admitting: Physician Assistant

## 2021-08-12 ENCOUNTER — Encounter (HOSPITAL_BASED_OUTPATIENT_CLINIC_OR_DEPARTMENT_OTHER): Payer: Self-pay | Admitting: Family Medicine

## 2021-08-12 ENCOUNTER — Other Ambulatory Visit: Payer: Self-pay

## 2021-08-12 VITALS — BP 130/71 | HR 77 | Resp 20 | Ht 71.0 in | Wt 170.0 lb

## 2021-08-12 DIAGNOSIS — I712 Thoracic aortic aneurysm, without rupture, unspecified: Secondary | ICD-10-CM | POA: Diagnosis not present

## 2021-08-12 DIAGNOSIS — I77810 Thoracic aortic ectasia: Secondary | ICD-10-CM

## 2021-08-12 DIAGNOSIS — I2510 Atherosclerotic heart disease of native coronary artery without angina pectoris: Secondary | ICD-10-CM | POA: Diagnosis not present

## 2021-08-12 DIAGNOSIS — I7121 Aneurysm of the ascending aorta, without rupture: Secondary | ICD-10-CM

## 2021-08-12 MED ORDER — IOPAMIDOL (ISOVUE-370) INJECTION 76%
75.0000 mL | Freq: Once | INTRAVENOUS | Status: AC | PRN
  Administered 2021-08-12: 75 mL via INTRAVENOUS

## 2021-08-12 NOTE — Patient Instructions (Signed)
Make every effort to maintain a "heart-healthy" lifestyle with regular physical exercise and adherence to a low-fat, low-carbohydrate diet.  Continue to seek regular follow-up appointments with your primary care physician and/or cardiologist.   Avoid Fluoroquinolones due to increase Aortic Dissection

## 2021-08-12 NOTE — Progress Notes (Signed)
[address]       [place] [address]             [place] [phone] [birth year]   History of Present Illness:  The patient is a 76 year old gentleman who was initially evaluated by Dr. Cyndia Bent  07/13/2019 in consultation for a 4.4 cm aortic root aneurysm which was felt to be slightly larger than on the prior CT scan in January 2017 when it was measured at 4.1 cm.  He was last evaluated by Dr. Cyndia Bent on 07/11/2020 for 1 year follow-up.  This showed the aortic root to have stable diameter at about 4.4 cm at the level of the sinus of Valsalva.  The ascending aorta measured 3.7 cm in maximum diameter.  He did have a new 10 mm nodule in the left lower lobe and a 9 mm nodule in the right upper lobe, neither of which was present on the prior study.  He has history of COPD and is followed by Dr. Lamonte Sakai.  The patient knew about these nodules and they are being followed appropriately.  The patient again presents today for 1 year follow up.  Overall he is doing well.  He is a former smoker but continues to have shortness of breath due to COPD.  This has required him to present to the ED for evaluation with most recent being in December of last year.  He denies chest pain and or back pain.  Current Outpatient Medications on File Prior to Visit  Medication Sig Dispense Refill   albuterol (PROVENTIL) (2.5 MG/3ML) 0.083% nebulizer solution Take 3 mLs (2.5 mg total) by nebulization every 6 (six) hours as needed for wheezing or shortness of breath. 75 mL 5   albuterol (VENTOLIN HFA) 108 (90 Base) MCG/ACT inhaler INHALE 2 PUFFS INTO THE LUNGS EVERY 6 HOURS AS NEEDED FOR SHORTNESS OF BREATH 18 each 1   aspirin EC 81 MG EC tablet Take 1 tablet (81 mg total) by mouth daily.     B Complex Vitamins (B COMPLEX PO) Take 1 tablet by mouth daily.     benzonatate (TESSALON) 200 MG capsule Take 1 capsule (200 mg total) by mouth 3 (three) times daily as needed for cough. 30  capsule 1   cholecalciferol (VITAMIN D3) 25 MCG (1000 UT) tablet Take 1,000 Units by mouth daily.     Coenzyme Q10-Vitamin E 100-300 MG-UNIT CHEW Chew 1 tablet by mouth daily.     diazepam (VALIUM) 2 MG tablet Take 1-2 tabs 30 minutes before flight. 10 tablet 0   diphenoxylate-atropine (LOMOTIL) 2.5-0.025 MG tablet Take 1 tablet by mouth 4 (four) times daily as needed for diarrhea or loose stools. 60 tablet 1   dutasteride (AVODART) 0.5 MG capsule Take 0.5 mg by mouth daily.     famotidine (PEPCID) 20 MG tablet Take 20 mg by mouth every evening.      Fluticasone-Umeclidin-Vilant (TRELEGY ELLIPTA) 100-62.5-25 MCG/ACT AEPB TAKE 1 PUFF BY MOUTH EVERY DAY 60 each 5   glucosamine-chondroitin 500-400 MG tablet Take 3 tablets by mouth daily. (Patient taking differently: Take 2 tablets by mouth daily.)     hydrochlorothiazide (MICROZIDE) 12.5 MG capsule Take 1 capsule (12.5 mg total) by mouth daily. 90 capsule 3   losartan (COZAAR) 100 MG tablet TAKE 1 TABLET BY MOUTH EVERY DAY 90 tablet 0   metaxalone (SKELAXIN) 800 MG tablet Take 1 tablet (800 mg total) by mouth  3 (three) times daily as needed for muscle spasms. 30 tablet 0   Multiple Vitamins-Minerals (CENTRUM SILVER PO) Take 1 tablet by mouth daily.     mupirocin cream (BACTROBAN) 2 % Apply 1 application topically 2 (two) times daily. 30 g 1   Omega-3 Fatty Acids (FISH OIL PO) Take 1 tablet by mouth daily.     predniSONE (STERAPRED UNI-PAK 21 TAB) 10 MG (21) TBPK tablet Take by mouth daily. Take 6 tabs by mouth daily  for 2 days, then 5 tabs for 2 days, then 4 tabs for 2 days, then 3 tabs for 2 days, 2 tabs for 2 days, then 1 tab by mouth daily for 2 days 42 tablet 0   rosuvastatin (CRESTOR) 5 MG tablet TAKE 1 TABLET (5 MG TOTAL) BY MOUTH DAILY. 90 tablet 0   tamsulosin (FLOMAX) 0.4 MG CAPS capsule Take 0.4 mg by mouth daily.      TURMERIC CURCUMIN PO Take 1 tablet by mouth daily.     No current facility-administered medications on file prior to  visit.    BP 130/71 (BP Location: Right Arm, Patient Position: Sitting, Cuff Size: Normal)    Pulse 77    Resp 20    Ht 5\' 11"  (1.803 m)    Wt 170 lb (77.1 kg)    SpO2 95% Comment: RA   BMI 23.71 kg/m   Physical Exam  Gen: no apparent distress Heart: RRR Lungs: CTA Abd: non distended Ext: no edema present Neuro: grossly intact  CTA Results:  RADIATION DOSE REDUCTION: This exam was performed according to the departmental dose-optimization program which includes automated exposure control, adjustment of the mA and/or kV according to patient size and/or use of iterative reconstruction technique.   CONTRAST:  45mL ISOVUE-370 IOPAMIDOL (ISOVUE-370) INJECTION 76%   COMPARISON:  10/03/2020   FINDINGS: Cardiovascular: Scattered coronary artery calcifications are seen. There is homogeneous enhancement in thoracic aorta. There is ectasia of ascending thoracic aorta measuring 4.3 x 4.2 cm which measured 3.8 cm in the previous study. There are scattered atherosclerotic plaques. There are no intraluminal filling defects in the pulmonary artery branches.   Mediastinum/Nodes: No significant lymphadenopathy seen   Lungs/Pleura: Pleural densities seen in both apices with no significant interval change. Few blebs are seen in the periphery of both apical regions. There is no focal pulmonary consolidation. Small linear densities in lower lung fields have not changed significantly, possibly suggesting scarring. There is no pleural effusion or pneumothorax.   Upper Abdomen: There is fatty infiltration in the liver.   Musculoskeletal: There is evidence of anterior fusion in the lower cervical spine.   Review of the MIP images confirms the above findings.   IMPRESSION: There is no evidence of thoracic aortic dissection. There is ectasia of ascending thoracic aorta measuring 4.3 cm. Scattered coronary artery calcifications are seen. There is no evidence of pulmonary artery embolism.  There is no focal pulmonary consolidation.   Fatty liver.     Electronically Signed   By: Elmer Picker M.D.   On: 08/12/2021 12:18   A/P:  The patient is a 76 yo male with known history of Aortic Root Enlargement, this has remained stable on serial CT scans.  I reviewed the CT scan imaging today and measurements performed by me at same images from last year remain essentially unchanged.  He denies chest/back pain.  He continues to have shortness of breath due to his COPD which is managed by Dr. Agustina Caroli office.  They also monitor  his pulmonary nodules.  Patient will return to the office in 1 year with repeat CTA chest.  He was instructed to contact our office sooner should the need arise.  Aortic Dissection information provided.  Risk Modification:  Statin:  Yes  Smoking cessation instruction/counseling given:  Patient is a former smoker  Patient was counseled on importance of Blood Pressure Control.  Despite Medical intervention if the patient notices persistently elevated blood pressure readings.  They are instructed to contact their Primary Care Physician  Please avoid use of Fluoroquinolones as this can potentially increase your risk of Aortic Rupture and/or Dissection  Patient educated on signs and symptoms of Aortic Dissection, handout also provided in [facility]  [place] Agent, PA-C 08/12/21   +

## 2021-08-13 DIAGNOSIS — U071 COVID-19: Secondary | ICD-10-CM | POA: Insufficient documentation

## 2021-08-14 DIAGNOSIS — R0602 Shortness of breath: Secondary | ICD-10-CM | POA: Diagnosis not present

## 2021-08-14 DIAGNOSIS — U071 COVID-19: Secondary | ICD-10-CM | POA: Diagnosis not present

## 2021-08-15 ENCOUNTER — Encounter: Payer: Self-pay | Admitting: Gastroenterology

## 2021-08-15 NOTE — Telephone Encounter (Signed)
FYI from patient through mychart  Good morning. Was supposed to fly out of town today, and yesterday I started feeling as if I were getting a sinus infection. Took a home test which showed positive, but because it was an important trip I arranged to get tested at a Doctors office. It was also positive. That doctor prescribed a Zack and prednisone. I am not a candidate for Paxlovid because I take Flomax which I can not stop taking. The primary care physician suggested I let you know. So far the symptoms have not been horrible. Later today I will use my nebulizer. Just wanted to keep you in the loop. Thanks George Watson

## 2021-08-15 NOTE — Telephone Encounter (Signed)
So sorry to hear you are sick with Covid.  Continue w/ PCP recs.  Would check Oxygen levels daily at home, call if <90%.  Has he gotten the Covid booster ?   Tried to call to discuss the above.  Would recommend Molnupiravir Antiviral pack x 5 days , since he is day 2 , high risk with age and COPD  Let me know if he would like for me to send in .    Please contact office for sooner follow up if symptoms do not improve or worsen or seek emergency care

## 2021-08-16 DIAGNOSIS — R0602 Shortness of breath: Secondary | ICD-10-CM | POA: Diagnosis not present

## 2021-08-16 DIAGNOSIS — U071 COVID-19: Secondary | ICD-10-CM | POA: Diagnosis not present

## 2021-08-20 ENCOUNTER — Encounter: Payer: Self-pay | Admitting: Gastroenterology

## 2021-08-20 NOTE — Telephone Encounter (Signed)
Spoke with pt. Let pt know Dr. Havery Moros said we could reschedule to give pt time to feel better. Rescheduled pt for 09/23/21 at 2:30 pm.

## 2021-08-23 ENCOUNTER — Ambulatory Visit: Payer: PPO | Admitting: Gastroenterology

## 2021-08-26 DIAGNOSIS — U071 COVID-19: Secondary | ICD-10-CM | POA: Diagnosis not present

## 2021-08-26 DIAGNOSIS — Z20822 Contact with and (suspected) exposure to covid-19: Secondary | ICD-10-CM | POA: Diagnosis not present

## 2021-08-26 DIAGNOSIS — R0602 Shortness of breath: Secondary | ICD-10-CM | POA: Diagnosis not present

## 2021-08-30 DIAGNOSIS — J441 Chronic obstructive pulmonary disease with (acute) exacerbation: Secondary | ICD-10-CM | POA: Diagnosis not present

## 2021-08-30 DIAGNOSIS — M9903 Segmental and somatic dysfunction of lumbar region: Secondary | ICD-10-CM | POA: Diagnosis not present

## 2021-08-30 DIAGNOSIS — M47812 Spondylosis without myelopathy or radiculopathy, cervical region: Secondary | ICD-10-CM | POA: Diagnosis not present

## 2021-08-30 DIAGNOSIS — M47817 Spondylosis without myelopathy or radiculopathy, lumbosacral region: Secondary | ICD-10-CM | POA: Diagnosis not present

## 2021-08-30 DIAGNOSIS — S39012A Strain of muscle, fascia and tendon of lower back, initial encounter: Secondary | ICD-10-CM | POA: Diagnosis not present

## 2021-08-30 DIAGNOSIS — M9902 Segmental and somatic dysfunction of thoracic region: Secondary | ICD-10-CM | POA: Diagnosis not present

## 2021-08-30 DIAGNOSIS — M9901 Segmental and somatic dysfunction of cervical region: Secondary | ICD-10-CM | POA: Diagnosis not present

## 2021-09-05 ENCOUNTER — Encounter (HOSPITAL_BASED_OUTPATIENT_CLINIC_OR_DEPARTMENT_OTHER): Payer: Self-pay | Admitting: Family Medicine

## 2021-09-09 ENCOUNTER — Encounter (HOSPITAL_BASED_OUTPATIENT_CLINIC_OR_DEPARTMENT_OTHER): Payer: Self-pay

## 2021-09-09 ENCOUNTER — Encounter (HOSPITAL_BASED_OUTPATIENT_CLINIC_OR_DEPARTMENT_OTHER): Payer: PPO | Admitting: Family Medicine

## 2021-09-13 ENCOUNTER — Encounter: Payer: Self-pay | Admitting: Gastroenterology

## 2021-09-13 NOTE — Telephone Encounter (Signed)
See alternate patient message. 

## 2021-09-13 NOTE — Telephone Encounter (Signed)
Additional message from patient "Thought I should mention I have been taking Prednisone 5 mg twice daily"

## 2021-09-17 ENCOUNTER — Ambulatory Visit: Payer: PPO | Admitting: Gastroenterology

## 2021-09-17 ENCOUNTER — Encounter: Payer: Self-pay | Admitting: Gastroenterology

## 2021-09-17 VITALS — BP 134/72 | HR 76 | Ht 71.0 in | Wt 176.8 lb

## 2021-09-17 DIAGNOSIS — R140 Abdominal distension (gaseous): Secondary | ICD-10-CM | POA: Diagnosis not present

## 2021-09-17 MED ORDER — FUROSEMIDE 20 MG PO TABS: 20.0000 mg | ORAL_TABLET | Freq: Every day | ORAL | 0 refills | Status: DC

## 2021-09-17 NOTE — Patient Instructions (Addendum)
We have sent the following medications to your pharmacy for you to pick up at your convenience: Lasix 20 mg daily for 3 days.  Limit sodium intake.   Eat a banana each day for the next 3 days.   Keep follow up with Dr. Havery Moros.

## 2021-09-17 NOTE — Progress Notes (Signed)
Agree with assessment and plan as outlined.  

## 2021-09-17 NOTE — Progress Notes (Signed)
09/17/2021 George Watson [id number] 1945-08-31   HISTORY OF PRESENT ILLNESS:  This is a 76 year old male who is a patient of Dr. Doyne Keel with cirrhosis from history of hep C that was successfully treated and history of alcohol abuse.  His cirrhosis appears to be very well compensated on labs and clinically.  He has never had any issues with lower extremity edema or ascites.  Ultrasound in December was unremarkable.  He is here today for complaints of abdominal bloating and fullness.  He had COVID in January and was on prednisone for about 4 weeks.  He discontinued it 4 or 5 days ago.  Has noticed bloating just for the past couple of weeks.  He reports his weight being stable within a pound or two at home, but in our system we have him up about 7 pounds from January 23.  Otherwise he feels well.  He denies abdominal pain.  He says that he just notices his stomach feels bloated and it is hard to breathe when bending over to tie his shoes.  He says that he is also very vain and does not like the way that it looks.  Past Medical History:  Diagnosis Date   Aortic aneurysm (HCC)    Arthritis    BPH (benign prostatic hypertrophy)    Cirrhosis (HCC)    COPD (chronic obstructive pulmonary disease) (HCC)    DDD (degenerative disc disease), lumbosacral    Elevated PSA    Emphysema of lung (HCC)    Essential hypertension, benign    diet controlled   Fracture of shaft of clavicle    GERD (gastroesophageal reflux disease)    H/O drug abuse ([place])    multisubstance   Hepatitis B    Hepatitis C 10/1996   HSV-2 (herpes simplex virus 2) infection    Hx of biopsy 08/2004   liver   Hx of colonic polyps 2022   Hyperlipidemia    Inguinal hernia    right   Other and unspecified hyperlipidemia    Prostate cancer ([place])    Substance abuse ([place])    Tuberculosis    Past Surgical History:  Procedure Laterality Date   BACK SURGERY     CERVICAL SPINE SURGERY     fracture ribs     3   HERNIA  REPAIR     INGUINAL HERNIA REPAIR  07/01/2012   Procedure: HERNIA REPAIR INGUINAL ADULT;  Surgeon: Madilyn Hook, DO;  Location: [facility];  Service: General;  Laterality: Right;  with Mesh   left knee meniscus repair  07/21/1990   right rotator cuff  07/21/1990   right shoulder arthroscopy  07/21/2009    reports that he quit smoking about 19 years ago. His smoking use included cigarettes. He has never used smokeless tobacco. He reports that he does not drink alcohol and does not use drugs. family history includes Breast cancer in his sister; Esophageal cancer in his father; Leukemia in his mother; Melanoma in his brother; Prostate cancer in his brother; Throat cancer in his father. Allergies  Allergen Reactions   Amlodipine Other (See Comments)    Leg cramping       Outpatient Encounter Medications as of 09/17/2021  Medication Sig   albuterol (PROVENTIL) (2.5 MG/3ML) 0.083% nebulizer solution Take 3 mLs (2.5 mg total) by nebulization every 6 (six) hours as needed for wheezing or shortness of breath.   albuterol (VENTOLIN HFA) 108 (90 Base) MCG/ACT inhaler INHALE 2 PUFFS INTO THE LUNGS EVERY  6 HOURS AS NEEDED FOR SHORTNESS OF BREATH   aspirin EC 81 MG EC tablet Take 1 tablet (81 mg total) by mouth daily.   B Complex Vitamins (B COMPLEX PO) Take 1 tablet by mouth daily.   benzonatate (TESSALON) 200 MG capsule Take 1 capsule (200 mg total) by mouth 3 (three) times daily as needed for cough.   cholecalciferol (VITAMIN D3) 25 MCG (1000 UT) tablet Take 1,000 Units by mouth daily.   Coenzyme Q10-Vitamin E 100-300 MG-UNIT CHEW Chew 1 tablet by mouth daily.   diazepam (VALIUM) 2 MG tablet Take 1-2 tabs 30 minutes before flight.   diphenoxylate-atropine (LOMOTIL) 2.5-0.025 MG tablet Take 1 tablet by mouth 4 (four) times daily as needed for diarrhea or loose stools.   dutasteride (AVODART) 0.5 MG capsule Take 0.5 mg by mouth daily.   famotidine (PEPCID) 20 MG tablet Take 20 mg by mouth every evening.     Fluticasone-Umeclidin-Vilant (TRELEGY ELLIPTA) 100-62.5-25 MCG/ACT AEPB TAKE 1 PUFF BY MOUTH EVERY DAY   glucosamine-chondroitin 500-400 MG tablet Take 3 tablets by mouth daily. (Patient taking differently: Take 2 tablets by mouth daily.)   hydrochlorothiazide (MICROZIDE) 12.5 MG capsule Take 1 capsule (12.5 mg total) by mouth daily.   losartan (COZAAR) 100 MG tablet TAKE 1 TABLET BY MOUTH EVERY DAY   metaxalone (SKELAXIN) 800 MG tablet Take 1 tablet (800 mg total) by mouth 3 (three) times daily as needed for muscle spasms.   Multiple Vitamins-Minerals (CENTRUM SILVER PO) Take 1 tablet by mouth daily.   mupirocin cream (BACTROBAN) 2 % Apply 1 application topically 2 (two) times daily.   Omega-3 Fatty Acids (FISH OIL PO) Take 1 tablet by mouth daily.   predniSONE (STERAPRED UNI-PAK 21 TAB) 10 MG (21) TBPK tablet Take by mouth daily. Take 6 tabs by mouth daily  for 2 days, then 5 tabs for 2 days, then 4 tabs for 2 days, then 3 tabs for 2 days, 2 tabs for 2 days, then 1 tab by mouth daily for 2 days   rosuvastatin (CRESTOR) 5 MG tablet TAKE 1 TABLET (5 MG TOTAL) BY MOUTH DAILY.   tamsulosin (FLOMAX) 0.4 MG CAPS capsule Take 0.4 mg by mouth daily.    TURMERIC CURCUMIN PO Take 1 tablet by mouth daily.   No facility-administered encounter medications on file as of 09/17/2021.     REVIEW OF SYSTEMS  : All other systems reviewed and negative except where noted in the History of Present Illness.   PHYSICAL EXAM: BP 134/72    Pulse 76    Ht 5\' 11"  (1.803 m)    Wt 176 lb 12.8 oz (80.2 kg)    BMI 24.66 kg/m  General: Well developed white male in no acute distress Head: Normocephalic and atraumatic Eyes:  Sclerae anicteric, conjunctiva pink. Ears: Normal auditory acuity Lungs: Clear throughout to auscultation; no W/R/R. Heart: Regular rate and rhythm no M/R/G. Abdomen: Soft, non-distended.  BS present.  Non-tender.  Does not seem to have ascites on exam. Musculoskeletal: Symmetrical with no gross  deformities  Skin: No lesions on visible extremities Extremities: No edema  Neurological: Alert oriented x 4, grossly non-focal Psychological:  Alert and cooperative. Normal mood and affect  ASSESSMENT AND PLAN: *76 year old male with cirrhosis from history of hep C that was successfully treated and history of alcohol abuse.  His cirrhosis appears to be very well compensated on labs and clinically.  He has never had any issues with lower extremity edema or ascites.  Ultrasound in  December was unremarkable.  He is here today for complaints of abdominal bloating and fullness.  He had COVID in January and was on prednisone for about 4 weeks.  He discontinued it 4 or 5 days ago.  Has noticed bloating just for the past couple of weeks.  He reports his weight being stable within a pound or two at home, but in our system we have him up about 7 pounds from January 23.  I bet he has had some fluid retention from the prednisone.  Prednisone itself as a side effect can also cause abdominal bloating.  This should improve the longer that he is off the prednisone.  He has no lower extremity swelling and does not clinically seem to have ascites on exam.  I agreed to give him a trial of Lasix 20 mg daily for the next 3 days.  We also discussed low-sodium diet.  Advised him to be sure to eat bananas and other potassium rich foods over the next few days while taking the Lasix.  He has a regular yearly follow-up with Dr. Havery Moros scheduled for next week so they can rediscuss at that time and see how he is feeling, get an updated weight, etc.  Prescription for Lasix was sent to his pharmacy.   CC:  [place], Blondell Reveal, MD

## 2021-09-21 ENCOUNTER — Other Ambulatory Visit: Payer: Self-pay | Admitting: Family Medicine

## 2021-09-23 ENCOUNTER — Encounter: Payer: Self-pay | Admitting: Gastroenterology

## 2021-09-23 ENCOUNTER — Ambulatory Visit: Payer: PPO | Admitting: Gastroenterology

## 2021-09-23 ENCOUNTER — Other Ambulatory Visit (INDEPENDENT_AMBULATORY_CARE_PROVIDER_SITE_OTHER): Payer: PPO

## 2021-09-23 VITALS — BP 130/68 | HR 76 | Ht 71.0 in | Wt 176.6 lb

## 2021-09-23 DIAGNOSIS — R932 Abnormal findings on diagnostic imaging of liver and biliary tract: Secondary | ICD-10-CM

## 2021-09-23 DIAGNOSIS — R140 Abdominal distension (gaseous): Secondary | ICD-10-CM

## 2021-09-23 DIAGNOSIS — M47812 Spondylosis without myelopathy or radiculopathy, cervical region: Secondary | ICD-10-CM | POA: Diagnosis not present

## 2021-09-23 DIAGNOSIS — M9903 Segmental and somatic dysfunction of lumbar region: Secondary | ICD-10-CM | POA: Diagnosis not present

## 2021-09-23 DIAGNOSIS — M9901 Segmental and somatic dysfunction of cervical region: Secondary | ICD-10-CM | POA: Diagnosis not present

## 2021-09-23 DIAGNOSIS — M47817 Spondylosis without myelopathy or radiculopathy, lumbosacral region: Secondary | ICD-10-CM | POA: Diagnosis not present

## 2021-09-23 DIAGNOSIS — K589 Irritable bowel syndrome without diarrhea: Secondary | ICD-10-CM

## 2021-09-23 DIAGNOSIS — S39012A Strain of muscle, fascia and tendon of lower back, initial encounter: Secondary | ICD-10-CM | POA: Diagnosis not present

## 2021-09-23 DIAGNOSIS — M9902 Segmental and somatic dysfunction of thoracic region: Secondary | ICD-10-CM | POA: Diagnosis not present

## 2021-09-23 LAB — COMPREHENSIVE METABOLIC PANEL
ALT: 28 U/L (ref 0–53)
AST: 23 U/L (ref 0–37)
Albumin: 4.4 g/dL (ref 3.5–5.2)
Alkaline Phosphatase: 39 U/L (ref 39–117)
BUN: 22 mg/dL (ref 6–23)
CO2: 24 mEq/L (ref 19–32)
Calcium: 9.9 mg/dL (ref 8.4–10.5)
Chloride: 102 mEq/L (ref 96–112)
Creatinine, Ser: 0.9 mg/dL (ref 0.40–1.50)
GFR: 84.18 mL/min (ref >60.00)
Glucose, Bld: 96 mg/dL (ref 70–99)
Potassium: 3.8 mEq/L (ref 3.5–5.1)
Sodium: 138 mEq/L (ref 135–145)
Total Bilirubin: 0.6 mg/dL (ref 0.2–1.2)
Total Protein: 7.5 g/dL (ref 6.0–8.3)

## 2021-09-23 LAB — PROTIME-INR
INR: 1 ratio (ref 0.8–1.0)
Prothrombin Time: 11.5 s (ref 9.6–13.1)

## 2021-09-23 NOTE — Patient Instructions (Signed)
If you are age 75 or older, your body mass index should be between 23-30. Your Body mass index is 24.63 kg/m?Marland Kitchen If this is out of the aforementioned range listed, please consider follow up with your Primary Care Provider. ? ?If you are age 39 or younger, your body mass index should be between 19-25. Your Body mass index is 24.63 kg/m?Marland Kitchen If this is out of the aformentioned range listed, please consider follow up with your Primary Care Provider.  ? ?________________________________________________________ ? ?The [facility] providers would like to encourage you to use [facility] to communicate with providers for non-urgent requests or questions.  Due to long hold times on the telephone, sending your provider a message by [facility] may be a faster and more efficient way to get a response.  Please allow 48 business hours for a response.  Please remember that this is for non-urgent requests.  ?_______________________________________________________ ? ?Please go to the lab in the basement of our building to have lab work done as you leave today. Hit "B" for basement when you get on the elevator.  When the doors open the lab is on your left.  We will call you with the results. Thank you. ? ? ?You have been scheduled for an abdominal ultrasound at [facility] (1st floor of hospital) on Tuesday, 3-14 at 10:00 am. Please arrive 30 minutes prior to your appointment for registration. Make certain not to have anything to eat or drink 6 hours prior to your appointment. Should you need to reschedule your appointment, please contact radiology at [phone]. This test typically takes about 30 minutes to perform. ? ? ?Thank you for entrusting me with your care and for choosing [employer], ?Dr. Sultan Cellar ? ? ? ? ?

## 2021-09-23 NOTE — Progress Notes (Signed)
HPI :  76 year old male here for follow-up visit.  Recall he has a history of suspected postinfectious IBS, COPD, history of hepatitis C with question of cirrhosis, here for follow-up visit.  I last saw him in May 2022 when he established care with [place].  Recall he has a history of hepatitis C since 1990s, treated with multiple rounds of interferon and ribavirin which he failed.  He eventually had treatment at [facility] in 2014 for hep C with new regimens and was eradicated.  Remote alcohol use but nothing recently.  There has been suggestions on remote ultrasounds that he had underlying cirrhosis however ultrasounds in recent years have not shown any evidence of that.  He has not had any decompensations of his liver.  He does have a history of COPD, had COVID in recent months.  Was on a prednisone course for about 4 weeks over the past month or 2.  States during this time his abdomen has become distended and uncomfortable in the mid to lower abdomen.  He states when he bends over it puts tightness on his body and has a hard time taking a deep breath.  He denies any lower extremity edema with this.  Been ongoing for a few weeks now.  He has gained about 3 to 7 pounds over the past few months.  Was given a trial of Lasix 20 mg daily last week for a few doses which did not make any change in his symptoms.  His bowels are regular.  He is having a few loose stools per day which is his baseline, does not find any change in that recently or benefit after moving his bowels.  He uses Lomotil as needed at baseline but does not take it too frequently.  Recall he did have C. difficile over the past year when he had flare of his diarrhea and was treated for that with vancomycin with improvement.  He has baseline mild hemorrhoids that bother him but nothing significant.  He sees a dermatologist for rosacea, they wanted to start him on doxycycline and he has questions about if he will tolerate that or not and if he can go on  that.  He recently had an ultrasound of his right upper quadrant in December for [facility] screening and to reassess his liver which was reportedly normal.  He otherwise had a CT angio of his chest in January to reassess an aortic aneurysm and there was no abnormality reported in the upper abdomen other than some fatty liver.  His platelets and liver enzymes have previously been normal.  Prior evaluation: Colonoscopy 09/11/20 - 50m rectal polyp, divertculosis, small hemorrhoids, normal ileum - biopsies taken to rule out microscopic colitis  - polyp sessile serrated, normal colon otherwise - repeat colon in 5 years   [place]/09[place]: IMPRESSION: 1. Somewhat nodular contours of the liver may indicate changes of cirrhosis. No focal hepatic abnormality is seen. 2. No gallstones. 3. Abdominal aortic atherosclerosis.   [place]8/23/21: Hepatomegaly with cirrhotic morphology No focal lesions   EGD 12/05/20: - The exam of the esophagus was otherwise normal. No varices. - The entire examined stomach was normal. No varices. - A single small angiodysplastic lesion was found in the second portion of the duodenum. - The duodenal bulb and second portion of the duodenum were otherwise normal.   RUQ [place]21/22: IMPRESSION: Unremarkable right upper quadrant ultrasound.   C diff positive 01/10/21: Treated with vancomycin    Past Medical History:  Diagnosis Date  Aortic aneurysm (HCC)    Arthritis    BPH (benign prostatic hypertrophy)    Cirrhosis (HCC)    COPD (chronic obstructive pulmonary disease) (HCC)    DDD (degenerative disc disease), lumbosacral    Elevated PSA    Emphysema of lung ([place])    Essential hypertension, benign    diet controlled   Fracture of shaft of clavicle    GERD (gastroesophageal reflux disease)    H/O drug abuse ([place])    multisubstance   Hepatitis B    Hepatitis C 10/1996   HSV-2 (herpes simplex virus 2) infection    Hx of biopsy 08/2004   liver   Hx of colonic polyps  2022   Hyperlipidemia    Inguinal hernia    right   Other and unspecified hyperlipidemia    Prostate cancer ([place])    Substance abuse ([place])    Tuberculosis      Past Surgical History:  Procedure Laterality Date   BACK SURGERY     CERVICAL SPINE SURGERY     fracture ribs     3   HERNIA REPAIR     INGUINAL HERNIA REPAIR  07/01/2012   Procedure: HERNIA REPAIR INGUINAL ADULT;  Surgeon: Madilyn Hook, DO;  Location: [facility];  Service: General;  Laterality: Right;  with Mesh   left knee meniscus repair  07/21/1990   right rotator cuff  07/21/1990   right shoulder arthroscopy  07/21/2009   Family History  Problem Relation Age of Onset   Breast cancer Sister    Leukemia Mother    Throat cancer Father    Esophageal cancer Father    Prostate cancer Brother    Melanoma Brother    Colon cancer Neg Hx    Stomach cancer Neg Hx    Pancreatic cancer Neg Hx    Liver disease Neg Hx    Social History   Tobacco Use   Smoking status: Former    Years: 43.00    Types: Cigarettes    Quit date: 07/21/2002    Years since quitting: 19.1   Smokeless tobacco: Never  Vaping Use   Vaping Use: Never used  Substance Use Topics   Alcohol use: No    Comment: 1988   Drug use: No    Comment: 26 years ago cocaine, heroin. methadone   Current Outpatient Medications  Medication Sig Dispense Refill   albuterol (PROVENTIL) (2.5 MG/3ML) 0.083% nebulizer solution Take 3 mLs (2.5 mg total) by nebulization every 6 (six) hours as needed for wheezing or shortness of breath. 75 mL 5   albuterol (VENTOLIN HFA) 108 (90 Base) MCG/ACT inhaler INHALE 2 PUFFS INTO THE LUNGS EVERY 6 HOURS AS NEEDED FOR SHORTNESS OF BREATH 18 each 1   aspirin EC 81 MG EC tablet Take 1 tablet (81 mg total) by mouth daily.     B Complex Vitamins (B COMPLEX PO) Take 1 tablet by mouth daily.     benzonatate (TESSALON) 200 MG capsule Take 1 capsule (200 mg total) by mouth 3 (three) times daily as needed for cough. 30 capsule 1    cholecalciferol (VITAMIN D3) 25 MCG (1000 UT) tablet Take 1,000 Units by mouth daily.     Coenzyme Q10-Vitamin E 100-300 MG-UNIT CHEW Chew 1 tablet by mouth daily.     diazepam (VALIUM) 2 MG tablet Take 1-2 tabs 30 minutes before flight. 10 tablet 0   diphenoxylate-atropine (LOMOTIL) 2.5-0.025 MG tablet Take 1 tablet by mouth 4 (four) times daily as needed  for diarrhea or loose stools. 60 tablet 1   dutasteride (AVODART) 0.5 MG capsule Take 0.5 mg by mouth daily.     famotidine (PEPCID) 20 MG tablet Take 20 mg by mouth every evening.      Fluticasone-Umeclidin-Vilant (TRELEGY ELLIPTA) 100-62.5-25 MCG/ACT AEPB TAKE 1 PUFF BY MOUTH EVERY DAY 60 each 5   furosemide (LASIX) 20 MG tablet Take 1 tablet (20 mg total) by mouth daily. 3 tablet 0   glucosamine-chondroitin 500-400 MG tablet Take 3 tablets by mouth daily. (Patient taking differently: Take 2 tablets by mouth daily.)     hydrochlorothiazide (MICROZIDE) 12.5 MG capsule Take 1 capsule (12.5 mg total) by mouth daily. 90 capsule 3   losartan (COZAAR) 100 MG tablet TAKE 1 TABLET BY MOUTH EVERY DAY 90 tablet 0   metaxalone (SKELAXIN) 800 MG tablet TAKE 1 TABLET (800 MG TOTAL) BY MOUTH 3 (THREE) TIMES DAILY AS NEEDED FOR MUSCLE SPASMS. 30 tablet 0   Multiple Vitamins-Minerals (CENTRUM SILVER PO) Take 1 tablet by mouth daily.     mupirocin cream (BACTROBAN) 2 % Apply 1 application topically 2 (two) times daily. 30 g 1   Omega-3 Fatty Acids (FISH OIL PO) Take 1 tablet by mouth daily.     predniSONE (STERAPRED UNI-PAK 21 TAB) 10 MG (21) TBPK tablet Take by mouth daily. Take 6 tabs by mouth daily  for 2 days, then 5 tabs for 2 days, then 4 tabs for 2 days, then 3 tabs for 2 days, 2 tabs for 2 days, then 1 tab by mouth daily for 2 days 42 tablet 0   rosuvastatin (CRESTOR) 5 MG tablet TAKE 1 TABLET (5 MG TOTAL) BY MOUTH DAILY. 90 tablet 0   tamsulosin (FLOMAX) 0.4 MG CAPS capsule Take 0.4 mg by mouth daily.      TURMERIC CURCUMIN PO Take 1 tablet by mouth  daily.     No current facility-administered medications for this visit.   No Known Allergies   Review of Systems: All systems reviewed and negative except where noted in HPI.   Lab Results  Component Value Date   WBC 10.7 07/08/2021   HGB 15.9 07/08/2021   HCT 46.8 07/08/2021   MCV 91 07/08/2021   PLT 268 07/08/2021    Lab Results  Component Value Date   CREATININE 0.96 07/08/2021   BUN 19 07/08/2021   NA 141 07/08/2021   K 4.6 07/08/2021   CL 102 07/08/2021   CO2 24 07/08/2021    Lab Results  Component Value Date   ALT 28 07/08/2021   AST 26 07/08/2021   ALKPHOS 57 07/08/2021   BILITOT 0.5 07/08/2021     Physical Exam: BP 130/68    Pulse 76    Ht '5\' 11"'$  (1.803 m)    Wt 176 lb 9.6 oz (80.1 kg)    SpO2 96%    BMI 24.63 kg/m  Constitutional: Pleasant,well-developed, male in no acute distress. HEENT: Normocephalic and atraumatic. Conjunctivae are normal. No scleral icterus. Neck supple.  Cardiovascular: Normal rate, regular rhythm.  Pulmonary/chest: Effort normal and breath sounds normal. Some scatterred course BS B expiration Abdominal: Soft, mildly distended, nontender - no obvious ascites / fluid wave.  There are no masses palpable.  Extremities: no edema Lymphadenopathy: No cervical adenopathy noted. Neurological: Alert and oriented to person place and time. Skin: Skin is warm and dry. No rashes noted. Psychiatric: Normal mood and affect. Behavior is normal.   ASSESSMENT AND PLAN: 76 year old male here for reassessment of the  following:  Abdominal distention Abnormal liver imaging IBS  As above, patient with history hepatitis C that was treated in the past, there is question of cirrhosis on prior imaging, however most recent imaging did not show overt cirrhosis.  His platelet count and LFTs historically have been normal.  If he has cirrhosis it has been thought to be compensated.  Recently on prednisone for COPD/COVID for the past 4 weeks or so, has had  some weight gain and now some abdominal distention.  Given trial of Lasix without any improvement.  He has no edema in his lower extremities.  The patient could simply have weight gain in his abdomen from recent prednisone, states he has not had that in the past.  He is concerned about fluid retention and ascites.  I am not convinced he has ascites on exam today but offered him an ultrasound to reevaluate for this to assess for gross ascites.  We discussed his history to date, again unclear if he has cirrhosis or not, more recent imaging and labs would argue against cirrhosis, but we will see what his imaging shows, and repeat labs today.  If he has evidence of obvious ascites on this exam we will treat that, may need paracentesis in stronger regimen of diuretics.  If no ascites noted at all then he simply may have abdominal distention from weight gain in the setting of prednisone.  I otherwise do not see anything else concerning on exam today.  We will await his labs and ultrasound on his course.  We otherwise discussed his rosacea, if he wants to go on doxycycline I think that is okay to do so if his dermatologist feels he would benefit from that.  He will monitor stools, if he has worsening diarrhea or intolerant he will stop and let us know.  Continue Lomotil as needed.  Jolly Mango, MD [facility], [facility]

## 2021-09-24 LAB — AFP TUMOR MARKER: AFP-Tumor Marker: 9.6 ng/mL — ABNORMAL HIGH (ref <6.1)

## 2021-09-25 DIAGNOSIS — M9901 Segmental and somatic dysfunction of cervical region: Secondary | ICD-10-CM | POA: Diagnosis not present

## 2021-09-25 DIAGNOSIS — M47812 Spondylosis without myelopathy or radiculopathy, cervical region: Secondary | ICD-10-CM | POA: Diagnosis not present

## 2021-09-25 DIAGNOSIS — M9903 Segmental and somatic dysfunction of lumbar region: Secondary | ICD-10-CM | POA: Diagnosis not present

## 2021-09-25 DIAGNOSIS — M9902 Segmental and somatic dysfunction of thoracic region: Secondary | ICD-10-CM | POA: Diagnosis not present

## 2021-09-25 DIAGNOSIS — M47817 Spondylosis without myelopathy or radiculopathy, lumbosacral region: Secondary | ICD-10-CM | POA: Diagnosis not present

## 2021-09-25 DIAGNOSIS — S39012A Strain of muscle, fascia and tendon of lower back, initial encounter: Secondary | ICD-10-CM | POA: Diagnosis not present

## 2021-09-27 DIAGNOSIS — J441 Chronic obstructive pulmonary disease with (acute) exacerbation: Secondary | ICD-10-CM | POA: Diagnosis not present

## 2021-09-30 ENCOUNTER — Encounter: Payer: Self-pay | Admitting: Gastroenterology

## 2021-09-30 DIAGNOSIS — M9903 Segmental and somatic dysfunction of lumbar region: Secondary | ICD-10-CM | POA: Diagnosis not present

## 2021-09-30 DIAGNOSIS — M47817 Spondylosis without myelopathy or radiculopathy, lumbosacral region: Secondary | ICD-10-CM | POA: Diagnosis not present

## 2021-09-30 DIAGNOSIS — M47812 Spondylosis without myelopathy or radiculopathy, cervical region: Secondary | ICD-10-CM | POA: Diagnosis not present

## 2021-09-30 DIAGNOSIS — M9901 Segmental and somatic dysfunction of cervical region: Secondary | ICD-10-CM | POA: Diagnosis not present

## 2021-09-30 DIAGNOSIS — M9902 Segmental and somatic dysfunction of thoracic region: Secondary | ICD-10-CM | POA: Diagnosis not present

## 2021-09-30 DIAGNOSIS — S39012A Strain of muscle, fascia and tendon of lower back, initial encounter: Secondary | ICD-10-CM | POA: Diagnosis not present

## 2021-10-01 ENCOUNTER — Ambulatory Visit (HOSPITAL_COMMUNITY)
Admission: RE | Admit: 2021-10-01 | Discharge: 2021-10-01 | Disposition: A | Discharge status: Home / Self Care | Payer: PPO | Source: Ambulatory Visit | Attending: Gastroenterology | Admitting: Gastroenterology

## 2021-10-01 ENCOUNTER — Other Ambulatory Visit: Payer: Self-pay

## 2021-10-01 DIAGNOSIS — R932 Abnormal findings on diagnostic imaging of liver and biliary tract: Secondary | ICD-10-CM | POA: Diagnosis not present

## 2021-10-01 DIAGNOSIS — K589 Irritable bowel syndrome without diarrhea: Secondary | ICD-10-CM | POA: Diagnosis not present

## 2021-10-01 DIAGNOSIS — R160 Hepatomegaly, not elsewhere classified: Secondary | ICD-10-CM | POA: Diagnosis not present

## 2021-10-01 DIAGNOSIS — R140 Abdominal distension (gaseous): Secondary | ICD-10-CM | POA: Insufficient documentation

## 2021-10-01 DIAGNOSIS — K7689 Other specified diseases of liver: Secondary | ICD-10-CM | POA: Diagnosis not present

## 2021-10-02 ENCOUNTER — Other Ambulatory Visit: Payer: Self-pay

## 2021-10-02 ENCOUNTER — Encounter: Payer: Self-pay | Admitting: Gastroenterology

## 2021-10-02 DIAGNOSIS — R160 Hepatomegaly, not elsewhere classified: Secondary | ICD-10-CM

## 2021-10-02 DIAGNOSIS — R932 Abnormal findings on diagnostic imaging of liver and biliary tract: Secondary | ICD-10-CM

## 2021-10-04 ENCOUNTER — Other Ambulatory Visit: Payer: Self-pay

## 2021-10-04 ENCOUNTER — Encounter: Payer: Self-pay | Admitting: Gastroenterology

## 2021-10-04 ENCOUNTER — Ambulatory Visit (HOSPITAL_BASED_OUTPATIENT_CLINIC_OR_DEPARTMENT_OTHER)
Admission: RE | Admit: 2021-10-04 | Discharge: 2021-10-04 | Disposition: A | Discharge status: Home / Self Care | Payer: PPO | Source: Ambulatory Visit | Attending: Gastroenterology | Admitting: Gastroenterology

## 2021-10-04 DIAGNOSIS — R160 Hepatomegaly, not elsewhere classified: Secondary | ICD-10-CM | POA: Insufficient documentation

## 2021-10-04 DIAGNOSIS — B1920 Unspecified viral hepatitis C without hepatic coma: Secondary | ICD-10-CM | POA: Diagnosis not present

## 2021-10-04 DIAGNOSIS — C220 Liver cell carcinoma: Secondary | ICD-10-CM

## 2021-10-04 DIAGNOSIS — R932 Abnormal findings on diagnostic imaging of liver and biliary tract: Secondary | ICD-10-CM | POA: Insufficient documentation

## 2021-10-04 DIAGNOSIS — K7689 Other specified diseases of liver: Secondary | ICD-10-CM | POA: Diagnosis not present

## 2021-10-04 MED ORDER — GADOBUTROL 1 MMOL/ML IV SOLN
8.0000 mL | Freq: Once | INTRAVENOUS | Status: AC | PRN
  Administered 2021-10-04: 8 mL via INTRAVENOUS
  Filled 2021-10-04: qty 8

## 2021-10-04 NOTE — Telephone Encounter (Signed)
Dr. Ardis Hughs this is [name] pt with a hx of IBS, bloating, abdominal distention, and cirrhosis. He recently had an [place] that showed a new liver lesion. Pt completed MRI today, results are in epic. ?

## 2021-10-04 NOTE — Telephone Encounter (Signed)
I spoke with him about the MRI.  He understands it is very likely [facility] and that the next step is evaluation my medical oncology.  ?Can you get that referral going.  Thanks  ? ?Ardine Bjork  ? ?Ambulatory referral to oncology in epic. Secure staff message sent to Ihor Gully, RN (oncology nurse navigator). ?

## 2021-10-05 ENCOUNTER — Encounter (HOSPITAL_COMMUNITY): Payer: Self-pay | Admitting: *Deleted

## 2021-10-05 ENCOUNTER — Ambulatory Visit (HOSPITAL_COMMUNITY)
Admission: EM | Admit: 2021-10-05 | Discharge: 2021-10-05 | Disposition: A | Discharge status: Home / Self Care | Payer: PPO | Attending: Emergency Medicine | Admitting: Emergency Medicine

## 2021-10-05 ENCOUNTER — Other Ambulatory Visit: Payer: Self-pay

## 2021-10-05 ENCOUNTER — Encounter: Payer: Self-pay | Admitting: Gastroenterology

## 2021-10-05 DIAGNOSIS — J441 Chronic obstructive pulmonary disease with (acute) exacerbation: Secondary | ICD-10-CM | POA: Diagnosis not present

## 2021-10-05 MED ORDER — AMOXICILLIN-POT CLAVULANATE 875-125 MG PO TABS: 1.0000 | ORAL_TABLET | Freq: Two times a day (BID) | ORAL | 0 refills | Status: DC

## 2021-10-05 MED ORDER — PREDNISONE 10 MG (21) PO TBPK: ORAL_TABLET | Freq: Every day | ORAL | 0 refills | Status: DC

## 2021-10-05 MED ORDER — AZITHROMYCIN 250 MG PO TABS: 250.0000 mg | ORAL_TABLET | Freq: Every day | ORAL | 0 refills | Status: DC

## 2021-10-05 NOTE — Discharge Instructions (Signed)
Today you are being treated for a COPD exacerbation, with antibiotics will also cover for pneumonia ? ?Take Augmentin twice daily for the next 7 days ? ?You may begin use of azithromycin as directed ? ?Take prednisone every morning with food as directed on packaging ? ?Maintaining adequate hydration may help to thin secretions and soothe the respiratory mucosa  ? ?Warm Liquids- Ingestion of warm liquids may have a soothing effect on the respiratory mucosa, increase the flow of nasal mucus, and loosen respiratory secretions, making them easier to remove ? ?May try honey (2.5 to 5 mL [0.5 to 1 teaspoon]) can be given straight or diluted in liquid (juice). Corn syrup may be substituted if honey is not available.   ? ?You may attempt use of over-the-counter Delsym to further help with your coughing in addition to the cough medicine that you have at home ?

## 2021-10-05 NOTE — ED Provider Notes (Signed)
?[place] ? ? ? ?CSN: [id number] ?Arrival date & time: 10/05/21  1004 ? ? ?  ? ?History   ?Chief Complaint ?Chief Complaint  ?Patient presents with  ? Shortness of Breath  ? Cough  ? ? ?HPI ?George Watson is a 76 y.o. male.  ? ?Patient presents with chest congestion for 2 days and a productive cough beginning 1 day ago.  Endorses shortness of breath, chest tightness with rest and associated wheezing.  Has been using Trelegy and albuterol inhaler as prescribed, no further treatment of symptoms.  Tolerating food and liquids.  No known sick contacts.  Denies fever, chills, body aches, nasal congestion, rhinorrhea, sore throat, ear pain or headaches, abdominal pain, nausea, vomiting, diarrhea.  History of COPD, former smoker. ? ? ? ? ?Past Medical History:  ?Diagnosis Date  ? Aortic aneurysm ([place])   ? Arthritis   ? BPH (benign prostatic hypertrophy)   ? Cirrhosis ([place])   ? COPD (chronic obstructive pulmonary disease) ([place])   ? DDD (degenerative disc disease), lumbosacral   ? Elevated PSA   ? Emphysema of lung ([place])   ? Essential hypertension, benign   ? diet controlled  ? Fracture of shaft of clavicle   ? GERD (gastroesophageal reflux disease)   ? H/O drug abuse ([place])   ? multisubstance  ? Hepatitis B   ? Hepatitis C 10/1996  ? HSV-2 (herpes simplex virus 2) infection   ? Hx of biopsy 08/2004  ? liver  ? Hx of colonic polyps 2022  ? Hyperlipidemia   ? Inguinal hernia   ? right  ? Other and unspecified hyperlipidemia   ? Prostate cancer ([place])   ? Substance abuse ([place])   ? Tuberculosis   ? ? ?Patient Active Problem List  ? Diagnosis Date Noted  ? Bloating 09/17/2021  ? Lung nodule 10/05/2020  ? Gastroenteritis 05/08/2020  ? TIA (transient ischemic attack) 07/07/2019  ? Dilated aortic root ([place]) 06/13/2019  ? Pure hypertriglyceridemia 04/08/2019  ? Fear of flying 02/11/2018  ? Tear of right rotator cuff 08/31/2017  ? Prediabetes 01/06/2017  ? Atherosclerosis of aorta ([place]) 07/09/2016  ? Degeneration of lumbar  or lumbosacral intervertebral disc 02/15/2014  ? Essential hypertension, benign   ? Hyperlipidemia with target LDL less than 70   ? HSV-2 (herpes simplex virus 2) infection   ? COPD (chronic obstructive pulmonary disease) ([place])   ? Benign prostatic hyperplasia   ? DDD (degenerative disc disease), lumbosacral   ? Prostate cancer ([place])   ? Hepatitis C, chronic ([place]) 10/19/1996  ? ? ?Past Surgical History:  ?Procedure Laterality Date  ? BACK SURGERY    ? CERVICAL SPINE SURGERY    ? fracture ribs    ? 3  ? HERNIA REPAIR    ? INGUINAL HERNIA REPAIR  07/01/2012  ? Procedure: HERNIA REPAIR INGUINAL ADULT;  Surgeon: Madilyn Hook, DO;  Location: [facility];  Service: General;  Laterality: Right;  with Mesh  ? left knee meniscus repair  07/21/1990  ? right rotator cuff  07/21/1990  ? right shoulder arthroscopy  07/21/2009  ? ? ? ? ? ?Home Medications   ? ?Prior to Admission medications   ?Medication Sig Start Date End Date Taking? Authorizing Provider  ?albuterol (PROVENTIL) (2.5 MG/3ML) 0.083% nebulizer solution Take 3 mLs (2.5 mg total) by nebulization every 6 (six) hours as needed for wheezing or shortness of breath. 05/29/21 05/29/22  Parrett, Fonnie Mu, NP  ?albuterol (VENTOLIN HFA) 108 (90 Base) MCG/ACT  inhaler INHALE 2 PUFFS INTO THE LUNGS EVERY 6 HOURS AS NEEDED FOR SHORTNESS OF BREATH 08/04/21   [place], Blondell Reveal, MD  ?aspirin EC 81 MG EC tablet Take 1 tablet (81 mg total) by mouth daily. 07/09/19   Black, Lezlie Octave, NP  ?B Complex Vitamins (B COMPLEX PO) Take 1 tablet by mouth daily.    [provider]  ?benzonatate (TESSALON) 200 MG capsule Take 1 capsule (200 mg total) by mouth 3 (three) times daily as needed for cough. 05/21/21 05/21/22  Parrett, Fonnie Mu, NP  ?cholecalciferol (VITAMIN D3) 25 MCG (1000 UT) tablet Take 1,000 Units by mouth daily.    [provider]  ?Coenzyme Q10-Vitamin E 100-300 MG-UNIT CHEW Chew 1 tablet by mouth daily.    [provider]  ?diazepam (VALIUM) 2 MG tablet Take 1-2  tabs 30 minutes before flight. 07/23/21   [place], Raymond J, MD  ?diphenoxylate-atropine (LOMOTIL) 2.5-0.025 MG tablet Take 1 tablet by mouth 4 (four) times daily as needed for diarrhea or loose stools. 07/25/21   Armbruster, Carlota Raspberry, MD  ?dutasteride (AVODART) 0.5 MG capsule Take 0.5 mg by mouth daily.    [provider]  ?famotidine (PEPCID) 20 MG tablet Take 20 mg by mouth every evening.     [provider]  ?Fluticasone-Umeclidin-Vilant (TRELEGY ELLIPTA) 100-62.5-25 MCG/ACT AEPB TAKE 1 PUFF BY MOUTH EVERY DAY 06/25/21   Collene Gobble, MD  ?furosemide (LASIX) 20 MG tablet Take 1 tablet (20 mg total) by mouth daily. 09/17/21   Zehr, Laban Emperor, PA-C  ?glucosamine-chondroitin 500-400 MG tablet Take 3 tablets by mouth daily. ?Patient taking differently: Take 2 tablets by mouth daily. 02/15/18   Marrian Salvage, [place]  ?hydrochlorothiazide (MICROZIDE) 12.5 MG capsule Take 1 capsule (12.5 mg total) by mouth daily. 11/23/20   [name], MD  ?losartan (COZAAR) 100 MG tablet TAKE 1 TABLET BY MOUTH EVERY DAY 08/04/21   [place], Blondell Reveal, MD  ?metaxalone (SKELAXIN) 800 MG tablet TAKE 1 TABLET (800 MG TOTAL) BY MOUTH 3 (THREE) TIMES DAILY AS NEEDED FOR MUSCLE SPASMS. 09/23/21   [place], Raymond J, MD  ?Multiple Vitamins-Minerals (CENTRUM SILVER PO) Take 1 tablet by mouth daily.    [provider]  ?mupirocin cream (BACTROBAN) 2 % Apply 1 application topically 2 (two) times daily. 04/15/20   [name], MD  ?Omega-3 Fatty Acids (FISH OIL PO) Take 1 tablet by mouth daily.    [provider]  ?predniSONE (STERAPRED UNI-PAK 21 TAB) 10 MG (21) TBPK tablet Take by mouth daily. Take 6 tabs by mouth daily  for 2 days, then 5 tabs for 2 days, then 4 tabs for 2 days, then 3 tabs for 2 days, 2 tabs for 2 days, then 1 tab by mouth daily for 2 days 07/15/21   Raspet, Erin K, PA-C  ?rosuvastatin (CRESTOR) 5 MG tablet TAKE 1 TABLET (5 MG TOTAL) BY MOUTH DAILY. 07/01/21   [place], Raymond J, MD   ?tamsulosin (FLOMAX) 0.4 MG CAPS capsule Take 0.4 mg by mouth daily.     [provider]  ?TURMERIC CURCUMIN PO Take 1 tablet by mouth daily.    [provider]  ? ? ?Family History ?Family History  ?Problem Relation Age of Onset  ? Breast cancer Sister   ? Leukemia Mother   ? Throat cancer Father   ? Esophageal cancer Father   ? Prostate cancer Brother   ? Melanoma Brother   ? Colon cancer Neg  Hx   ? Stomach cancer Neg Hx   ? Pancreatic cancer Neg Hx   ? Liver disease Neg Hx   ? ? ?Social History ?Social History  ? ?Tobacco Use  ? Smoking status: Former  ?  Years: 43.00  ?  Types: Cigarettes  ?  Quit date: 07/21/2002  ?  Years since quitting: 19.2  ? Smokeless tobacco: Never  ?Vaping Use  ? Vaping Use: Never used  ?Substance Use Topics  ? Alcohol use: No  ?  Comment: 1988  ? Drug use: No  ?  Comment: 26 years ago cocaine, heroin. methadone  ? ? ? ?Allergies   ?Patient has no known allergies. ? ? ?Review of Systems ?Review of Systems  ?Constitutional: Negative.   ?HENT: Negative.    ?Respiratory:  Positive for cough, chest tightness, shortness of breath and wheezing. Negative for apnea, choking and stridor.   ?Cardiovascular: Negative.   ?Gastrointestinal: Negative.   ?Skin: Negative.   ?Neurological: Negative.   ? ? ?Physical Exam ?Triage Vital Signs ?ED Triage Vitals  ?Enc Vitals Group  ?   BP 10/05/21 1032 (!) 146/83  ?   Pulse Rate 10/05/21 1032 71  ?   Resp 10/05/21 1032 18  ?   Temp 10/05/21 1032 97.9 ?F (36.6 ?C)  ?   Temp src --   ?   SpO2 10/05/21 1032 95 %  ?   Weight --   ?   Height --   ?   Head Circumference --   ?   Peak Flow --   ?   Pain Score 10/05/21 1031 0  ?   Pain Loc --   ?   Pain Edu? --   ?   Excl. in [place]? --   ? ?No data found. ? ?Updated Vital Signs ?BP (!) 146/83   Pulse 71   Temp 97.9 ?F (36.6 ?C)   Resp 18   SpO2 95%  ? ?Visual Acuity ?Right Eye Distance:   ?Left Eye Distance:   ?Bilateral Distance:   ? ?Right Eye Near:   ?Left Eye Near:    ?Bilateral Near:     ? ?Physical Exam ?Constitutional:   ?   Appearance: Normal appearance. He is well-developed.  ?HENT:  ?   Head: Normocephalic.  ?   Right Ear: Tympanic membrane, ear canal and external ear normal.  ?   Left Ear: Tymp

## 2021-10-05 NOTE — ED Triage Notes (Signed)
Pt reports cough and SHOB. Pt reports he was positive for COVID 5 weeks and still is fatigues. Pt was Dx with liver CA yesterday. ?

## 2021-10-07 ENCOUNTER — Other Ambulatory Visit (HOSPITAL_BASED_OUTPATIENT_CLINIC_OR_DEPARTMENT_OTHER): Payer: Self-pay | Admitting: Family Medicine

## 2021-10-07 DIAGNOSIS — S39012A Strain of muscle, fascia and tendon of lower back, initial encounter: Secondary | ICD-10-CM | POA: Diagnosis not present

## 2021-10-07 DIAGNOSIS — M47817 Spondylosis without myelopathy or radiculopathy, lumbosacral region: Secondary | ICD-10-CM | POA: Diagnosis not present

## 2021-10-07 DIAGNOSIS — M9903 Segmental and somatic dysfunction of lumbar region: Secondary | ICD-10-CM | POA: Diagnosis not present

## 2021-10-07 DIAGNOSIS — M9901 Segmental and somatic dysfunction of cervical region: Secondary | ICD-10-CM | POA: Diagnosis not present

## 2021-10-07 DIAGNOSIS — M47812 Spondylosis without myelopathy or radiculopathy, cervical region: Secondary | ICD-10-CM | POA: Diagnosis not present

## 2021-10-07 DIAGNOSIS — M9902 Segmental and somatic dysfunction of thoracic region: Secondary | ICD-10-CM | POA: Diagnosis not present

## 2021-10-07 NOTE — Telephone Encounter (Signed)
This has been addressed. Pt was contacted by Dr. Havery Moros. See alternate pt message. ?

## 2021-10-07 NOTE — Telephone Encounter (Signed)
Secure staff message sent to [facility], RN to follow up on previous message and to confirm that pt will be added to cancer conference. Will await response.  ?

## 2021-10-08 ENCOUNTER — Encounter (HOSPITAL_BASED_OUTPATIENT_CLINIC_OR_DEPARTMENT_OTHER): Payer: Self-pay | Admitting: Family Medicine

## 2021-10-08 ENCOUNTER — Other Ambulatory Visit: Payer: Self-pay

## 2021-10-08 ENCOUNTER — Ambulatory Visit (INDEPENDENT_AMBULATORY_CARE_PROVIDER_SITE_OTHER): Payer: PPO | Admitting: Family Medicine

## 2021-10-08 ENCOUNTER — Telehealth: Payer: Self-pay | Admitting: Hematology

## 2021-10-08 ENCOUNTER — Other Ambulatory Visit (HOSPITAL_BASED_OUTPATIENT_CLINIC_OR_DEPARTMENT_OTHER): Payer: Self-pay

## 2021-10-08 VITALS — BP 142/70 | HR 90 | Ht 71.0 in | Wt 174.4 lb

## 2021-10-08 DIAGNOSIS — Z0000 Encounter for general adult medical examination without abnormal findings: Secondary | ICD-10-CM | POA: Diagnosis not present

## 2021-10-08 DIAGNOSIS — Z23 Encounter for immunization: Secondary | ICD-10-CM | POA: Diagnosis not present

## 2021-10-08 MED ORDER — SHINGRIX 50 MCG/0.5ML IM SUSR: 0.5000 mL | Freq: Once | INTRAMUSCULAR | 0 refills | Status: AC

## 2021-10-08 NOTE — Progress Notes (Signed)
Subjective:    CC: Annual Physical Exam  HPI:  George Watson is a 76 y.o. presenting for annual physical  I reviewed the past medical history, family history, social history, surgical history, and allergies today and no changes were needed.  Please see the problem list section below in epic for further details.  Past Medical History: Past Medical History:  Diagnosis Date   Aortic aneurysm (H[facility])    Arthritis    BPH (benign prostatic hypertrophy)    Cirrhosis ([facility])    COPD (chronic obstructive pulmonary disease) ([facility])    DDD (degenerative disc disease), lumbosacral    Elevated PSA    Emphysema of lung ([facility])    Essential hypertension, benign    diet controlled   Fracture of shaft of clavicle    GERD (gastroesophageal reflux disease)    H/O drug abuse ([place])    multisubstance   Hepatitis B    Hepatitis C 10/1996   HSV-2 (herpes simplex virus 2) infection    Hx of biopsy 08/2004   liver   Hx of colonic polyps 2022   Hyperlipidemia    Inguinal hernia    right   Other and unspecified hyperlipidemia    Prostate cancer ([place])    Substance abuse ([place])    Tuberculosis    Past Surgical History: Past Surgical History:  Procedure Laterality Date   BACK SURGERY     CERVICAL SPINE SURGERY     fracture ribs     3   HERNIA REPAIR     INGUINAL HERNIA REPAIR  07/01/2012   Procedure: HERNIA REPAIR INGUINAL ADULT;  Surgeon: Madilyn Hook, DO;  Location: [facility];  Service: General;  Laterality: Right;  with Mesh   left knee meniscus repair  07/21/1990   right rotator cuff  07/21/1990   right shoulder arthroscopy  07/21/2009   Social History: Social History   Socioeconomic History   Marital status: Married    Spouse name: Not on file   Number of children: Not on file   Years of education: Not on file   Highest education level: Not on file  Occupational History   Occupation: Geophysicist/field seismologist   Occupation: Retired     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 methadone   Sexual activity: Not Currently    Comment: number of sex partners in the last 12 months  1  Other Topics Concern   Not on file  Social History Narrative      Married to [employer]. Exercise cardio daily for 30 minutes. Education: [employer].   Social Determinants of Health   Financial Resource Strain: Low Risk    Difficulty of Paying Living Expenses: Not hard at all  Food Insecurity: No Food Insecurity   Worried About Charity fundraiser in the Last Year: Never true   [place] in the Last Year: Never true  Transportation Needs: No Transportation Needs   Lack of Transportation (Medical): No   Lack of Transportation (Non-Medical): No  Physical Activity: Sufficiently Active   Days  of Exercise per Week: 7 days   Minutes of Exercise per Session: 60 min  Stress: Not on file  Social Connections: Moderately Integrated   Frequency of Communication with Friends and Family: More than three times a week   Frequency of Social Gatherings with Friends and Family: Three times a week   Attends Religious Services: Never   Active Member of Clubs or Organizations: Yes   Attends Archivist Meetings: 1 to 4 times per year   Marital Status: Married   Family History: Family History  Problem Relation Age of Onset   Breast cancer Sister    Leukemia Mother    Throat cancer Father    Esophageal cancer Father    Prostate cancer Brother    Melanoma Brother    Colon cancer Neg Hx    Stomach cancer Neg Hx    Pancreatic cancer Neg Hx    Liver disease Neg Hx    Allergies: No Known Allergies Medications: See med rec.  Review of Systems: No headache, visual changes, nausea,  vomiting, diarrhea, constipation, dizziness, abdominal pain, skin rash, fevers, chills, night sweats, swollen lymph nodes, weight loss, chest pain, body aches, joint swelling, muscle aches, shortness of breath, mood changes, visual or auditory hallucinations.  Objective:    BP (!) 152/80   Pulse 90   Ht '5\' 11"'$  (1.803 m)   Wt 174 lb 6.4 oz (79.1 kg)   SpO2 95%   BMI 24.32 kg/m   General: Well Developed, well nourished, and in no acute distress.  Neuro: Alert and oriented x3, extra-ocular muscles intact, sensation grossly intact. Cranial nerves II through XII are intact, motor, sensory, and coordinative functions are all intact. HEENT: Normocephalic, atraumatic, pupils equal round reactive to light, neck supple, no masses, no lymphadenopathy, thyroid nonpalpable. Oropharynx, nasopharynx, external ear canals are unremarkable. Skin: Warm and dry, no rashes noted.  Cardiac: Regular rate and rhythm, no murmurs rubs or gallops.  Respiratory: Clear to auscultation bilaterally. Not using accessory muscles, speaking in full sentences.  Abdominal: Soft, nontender, nondistended, positive bowel sounds, no masses, no organomegaly.  Musculoskeletal: Shoulder, elbow, wrist, hip, knee, ankle stable, and with full range of motion.  Impression and Recommendations:    Wellness examination Reviewed recent labs as well as those completed at time of last appointment. HCM reviewed/discussed. Anticipatory guidance regarding healthy weight, lifestyle and choices given. Recommend healthy diet.  Recommend approximately 150 minutes/week of moderate intensity exercise Recommend regular dental and vision exams Always use seatbelt/lap and shoulder restraints Recommend using smoke alarms and checking batteries at least twice a year Recommend using sunscreen when outside Patient does follow with urologist in regards to prostate cancer screening Patient did have MRI of the liver recently with concern for [facility].  He does  have upcoming evaluation with oncology  Plan for follow-up in about 3 months, will plan to check A1c at next office visit   ___________________________________________ Charlayne Vultaggio de [place], MD, ABFM, CAQSM [facility] and [place]

## 2021-10-08 NOTE — Telephone Encounter (Signed)
Scheduled appt per 3/17 referral. Pt is aware of appt date and time. Pt is aware to arrive 15 mins prior to appt time and to bring and updated insurance card. Pt is aware of appt location.   ?

## 2021-10-08 NOTE — Patient Instructions (Signed)
Preventive Care 65 Years and Older, Male °Preventive care refers to lifestyle choices and visits with your health care provider that can promote health and wellness. Preventive care visits are also called wellness exams. °What can I expect for my preventive care visit? °Counseling °During your preventive care visit, your health care provider may ask about your: °Medical history, including: °Past medical problems. °Family medical history. °History of falls. °Current health, including: °Emotional well-being. °Home life and relationship well-being. °Sexual activity. °Memory and ability to understand (cognition). °Lifestyle, including: °Alcohol, nicotine or tobacco, and drug use. °Access to firearms. °Diet, exercise, and sleep habits. °Work and work environment. °Sunscreen use. °Safety issues such as seatbelt and bike helmet use. °Physical exam °Your health care provider will check your: °Height and weight. These may be used to calculate your BMI (body mass index). BMI is a measurement that tells if you are at a healthy weight. °Waist circumference. This measures the distance around your waistline. This measurement also tells if you are at a healthy weight and may help predict your risk of certain diseases, such as type 2 diabetes and high blood pressure. °Heart rate and blood pressure. °Body temperature. °Skin for abnormal spots. °What immunizations do I need? °Vaccines are usually given at various ages, according to a schedule. Your health care provider will recommend vaccines for you based on your age, medical history, and lifestyle or other factors, such as travel or where you work. °What tests do I need? °Screening °Your health care provider may recommend screening tests for certain conditions. This may include: °Lipid and cholesterol levels. °Diabetes screening. This is done by checking your blood sugar (glucose) after you have not eaten for a while (fasting). °Hepatitis C test. °Hepatitis B test. °HIV (human  immunodeficiency virus) test. °STI (sexually transmitted infection) testing, if you are at risk. °Lung cancer screening. °Colorectal cancer screening. °Prostate cancer screening. °Abdominal aortic aneurysm (AAA) screening. You may need this if you are a current or former smoker. °Talk with your health care provider about your test results, treatment options, and if necessary, the need for more tests. °Follow these instructions at home: °Eating and drinking ° °Eat a diet that includes fresh fruits and vegetables, whole grains, lean protein, and low-fat dairy products. Limit your intake of foods with high amounts of sugar, saturated fats, and salt. °Take vitamin and mineral supplements as recommended by your health care provider. °Do not drink alcohol if your health care provider tells you not to drink. °If you drink alcohol: °Limit how much you have to 0-2 drinks a day. °Know how much alcohol is in your drink. In the [place] one drink equals one 12 oz bottle of beer (355 mL), one 5 oz glass of wine (148 mL), or one 1½ oz glass of hard liquor (44 mL). °Lifestyle °Brush your teeth every morning and night with fluoride toothpaste. Floss one time each day. °Exercise for at least 30 minutes 5 or more days each week. °Do not use any products that contain nicotine or tobacco. These products include cigarettes, chewing tobacco, and vaping devices, such as e-cigarettes. If you need help quitting, ask your health care provider. °Do not use drugs. °If you are sexually active, practice safe sex. Use a condom or other form of protection to prevent STIs. °Take aspirin only as told by your health care provider. Make sure that you understand how much to take and what form to take. Work with your health care provider to find out whether it is safe and   beneficial for you to take aspirin daily. °Ask your health care provider if you need to take a cholesterol-lowering medicine (statin). °Find healthy ways to manage stress, such  as: °Meditation, yoga, or listening to music. °Journaling. °Talking to a trusted person. °Spending time with friends and family. °Safety °Always wear your seat belt while driving or riding in a vehicle. °Do not drive: °If you have been drinking alcohol. Do not ride with someone who has been drinking. °When you are tired or distracted. °While texting. °If you have been using any mind-altering substances or drugs. °Wear a helmet and other protective equipment during sports activities. °If you have firearms in your house, make sure you follow all gun safety procedures. °Minimize exposure to UV radiation to reduce your risk of skin cancer. °What's next? °Visit your health care provider once a year for an annual wellness visit. °Ask your health care provider how often you should have your eyes and teeth checked. °Stay up to date on all vaccines. °This information is not intended to replace advice given to you by your health care provider. Make sure you discuss any questions you have with your health care provider. °Document Revised: 01/02/2021 Document Reviewed: 01/02/2021 °Elsevier Patient Education © 2022 [employer] Inc[employer] ° °

## 2021-10-08 NOTE — Assessment & Plan Note (Signed)
Reviewed recent labs as well as those completed at time of last appointment. HCM reviewed/discussed. Anticipatory guidance regarding healthy weight, lifestyle and choices given. ?Recommend healthy diet.  Recommend approximately 150 minutes/week of moderate intensity exercise ?Recommend regular dental and vision exams ?Always use seatbelt/lap and shoulder restraints ?Recommend using smoke alarms and checking batteries at least twice a year ?Recommend using sunscreen when outside ?Patient does follow with urologist in regards to prostate cancer screening ?

## 2021-10-14 ENCOUNTER — Inpatient Hospital Stay: Payer: PPO | Attending: Hematology | Admitting: Hematology

## 2021-10-14 ENCOUNTER — Encounter: Payer: Self-pay | Admitting: Hematology

## 2021-10-14 ENCOUNTER — Other Ambulatory Visit: Payer: Self-pay

## 2021-10-14 ENCOUNTER — Inpatient Hospital Stay: Payer: PPO

## 2021-10-14 VITALS — BP 123/79 | HR 76 | Temp 97.3°F | Resp 18 | Wt 174.1 lb

## 2021-10-14 DIAGNOSIS — Z800 Family history of malignant neoplasm of digestive organs: Secondary | ICD-10-CM | POA: Diagnosis not present

## 2021-10-14 DIAGNOSIS — Z808 Family history of malignant neoplasm of other organs or systems: Secondary | ICD-10-CM | POA: Insufficient documentation

## 2021-10-14 DIAGNOSIS — Z803 Family history of malignant neoplasm of breast: Secondary | ICD-10-CM | POA: Insufficient documentation

## 2021-10-14 DIAGNOSIS — Z79899 Other long term (current) drug therapy: Secondary | ICD-10-CM | POA: Diagnosis not present

## 2021-10-14 DIAGNOSIS — C61 Malignant neoplasm of prostate: Secondary | ICD-10-CM | POA: Insufficient documentation

## 2021-10-14 DIAGNOSIS — Z87891 Personal history of nicotine dependence: Secondary | ICD-10-CM | POA: Diagnosis not present

## 2021-10-14 DIAGNOSIS — Z8616 Personal history of COVID-19: Secondary | ICD-10-CM | POA: Diagnosis not present

## 2021-10-14 DIAGNOSIS — C220 Liver cell carcinoma: Secondary | ICD-10-CM | POA: Diagnosis not present

## 2021-10-14 DIAGNOSIS — K7460 Unspecified cirrhosis of liver: Secondary | ICD-10-CM | POA: Insufficient documentation

## 2021-10-14 LAB — CBC WITH DIFFERENTIAL/PLATELET
Abs Immature Granulocytes: 0.08 10*3/uL — ABNORMAL HIGH (ref 0.00–0.07)
Basophils Absolute: 0.1 10*3/uL (ref 0.0–0.1)
Basophils Relative: 1 %
Eosinophils Absolute: 0.1 10*3/uL (ref 0.0–0.5)
Eosinophils Relative: 1 %
HCT: 43.3 % (ref 39.0–52.0)
Hemoglobin: 14.9 g/dL (ref 13.0–17.0)
Immature Granulocytes: 1 %
Lymphocytes Relative: 21 %
Lymphs Abs: 2.5 10*3/uL (ref 0.7–4.0)
MCH: 31.2 pg (ref 26.0–34.0)
MCHC: 34.4 g/dL (ref 30.0–36.0)
MCV: 90.8 fL (ref 80.0–100.0)
Monocytes Absolute: 1.2 10*3/uL — ABNORMAL HIGH (ref 0.1–1.0)
Monocytes Relative: 10 %
Neutro Abs: 8.1 10*3/uL — ABNORMAL HIGH (ref 1.7–7.7)
Neutrophils Relative %: 66 %
Platelets: 279 10*3/uL (ref 150–400)
RBC: 4.77 MIL/uL (ref 4.22–5.81)
RDW: 13.2 % (ref 11.5–15.5)
WBC: 12.1 10*3/uL — ABNORMAL HIGH (ref 4.0–10.5)
nRBC: 0 % (ref 0.0–0.2)

## 2021-10-14 NOTE — Progress Notes (Signed)
?[place]   ?Telephone:(336) [phone] [phone]   ?Clinic New Consult Note  ? ?Patient Care Team: ?de [place], Blondell Reveal, MD as PCP - General (Family Medicine) ?Wall, Marijo Conception, MD (Inactive) (Cardiology) ?Richmond Campbell, MD (Gastroenterology) ?Newt Minion, MD (Orthopedic Surgery) ?Kathie Rhodes, MD (Inactive) (Urology) ?Jola Schmidt, MD as Consulting Physician (Ophthalmology) ?Danella Sensing, MD as Consulting Physician (Dermatology) ? ?Date of Service:  10/14/2021  ? ?CHIEF COMPLAINTS/PURPOSE OF CONSULTATION:  ?Hepatocellular Carcinoma ? ?REFERRING PHYSICIAN:  ?Dr. Havery Moros ? ?ASSESSMENT & PLAN:  ?[name] is a 75 y.o. male with a history of  ? ?1. Hepatocellular Carcinoma, cT1N0M0, stage IB ?-He has been followed by GI Dr. Havery Moros for mild cirrhosis and h/o hep C. AFP has been monitored annually and fluctuate; most recently 9.6 on 09/23/21.  ?-Abdomen US on 10/01/21 showed left liver mass, new from prior in 06/2021. Liver MRI on 10/04/21 confirmed 2.6 cm left hepatic lobe mass, imaging characteristics are consistent with hepatocellular carcinoma. ?-I reviewed the work up and findings thus far.  Given the typical image findings, I do not think he needs tissue biopsy to confirm diagnosis.  ?-We discussed standard treatment options for liver cancer, including surgery.  His disease is probably resectable, and he probably would not need liver transplant. If no surgery, he is probably a candidate for SBRT radiation therapy or IR liver targeted therapy such as ablation. I discussed he will likely not need chemotherapy since it was caught early. I did advise them that there is a very small possibility this could be a metastatic lesion from his untreated prostate cancer or other cancer, though it's unlikely considering his PSA has remained low (last in 2-3 months ago). I will check his PSA today just to be sure.  ?-We will obtain CT chest without contrast to complete staging and rule out  lung cancer or lung metastasis. ?-I discussed that I plan to present his case in our GI tumor board for recommendations regarding surgery. Depending on the decision, we will make the appropriate referrals at that time. They are agreeable with this plan. ?-at this point, I will leave his f/u open, but they know they can always reach out to [place] with questions or concerns. ? ?2. Treated hepatitis C, mild Liver Cirrhosis, Child-Pugh class A ?-hep C+ diagnosed 1998, likely contracted in 1960's. He failed multiple rounds of interferon and ribavirin. ?-hep C successfully treated at [facility] in 2014. ? ?3. H/o untreated Prostate Cancer ?-dx in 2011 with low grade prostate cancer. He is under active surveillance and on dutasteride per Dr. Lovena Neighbours. ?-per pt, PSA remains low. ? ? ?PLAN:  ?-lab today (AFP, CBC, PSA) ?-CT chest without contrast ?-we will present case to GI tumor board this or next week ?-f/u open ? ? ?Oncology History  ?Hepatocellular carcinoma ([place])  ?07/10/2021 Imaging  ? CLINICAL DATA:  Cirrhosis, hepatocellular carcinoma screening. ?  ?EXAM: ?ULTRASOUND ABDOMEN LIMITED RIGHT UPPER QUADRANT ? ?IMPRESSION: ?Unremarkable right upper quadrant ultrasound. ?  ?10/01/2021 Imaging  ? CLINICAL DATA:  Evaluation for ascites cirrhosis. ?  ?EXAM: ?ABDOMEN ULTRASOUND COMPLETE ? ?IMPRESSION: ?1. New 3.3 cm hypoechoic mass is noted the left lobe of the liver. ?Further evaluation with MRI of the abdomen is suggested.  ?2. No gallstones or biliary distention. ?  ?10/04/2021 Imaging  ? EXAM: ?MRI ABDOMEN WITHOUT AND WITH CONTRAST ? ?IMPRESSION: ?1. Mass in the left hepatic lobe segment 3 as described, consistent ?with hepatocellular carcinoma, LR 5. ?2. Subcentimeter focus of  early hyperenhancement in the right ?hepatic lobe without washout or corresponding signal abnormalities, LR 2. ?  ?10/14/2021 Initial Diagnosis  ? Hepatocellular carcinoma ([place]) ?  ? ? ? ?HISTORY OF PRESENTING ILLNESS:  ?George Watson 76 y.o. male is a here  because of [place]. The patient was referred by Dr. Havery Moros. The patient presents to the clinic today accompanied by his wife. ? ? ?He has been followed by Dr. Havery Moros for cirrhosis and history of hepatitis C since 1990's. Abdomen US on 07/10/21 was unremarkable. He tested positive for Covid in 07/2021 and was treated with prednisone. Since that time, he developed weight gain and abdominal distention. He was initially given lasix without any improvement. He underwent repeat abdomen US on 10/01/21 showing a new 3.3 cm hypoechoic mass in the left lobe of the liver. ? ?Further evaluation with liver MRI on 10/04/21 showed a 2.6 cm mass in the left hepatic lobe, segment 3, consistent with hepatocellular carcinoma, LR 5. Also notes was a subcentimeter focus of early hyperenhancement in right hepatic lobe. ? ? ?Today the patient notes they felt/feeling prior/after... ?-no change in appetite ?-he reports abdominal bloating since Covid infection in 07/2021 ? ?He has a PMHx of.... ?-hepatitis C, dx 1998, treated 2014 ?-emphysema  ?-h/o prostate cancer, dx in 2010, no treatment and on active surveillance ?-IBS ? ?Socially... ?-he is married with one son. ?-he reports a family history of a number of different cancers. ?-he reports prior use of alcohol and drugs, as well as smoking. He has been clean/sober of everything since at least 2004. ? ?REVIEW OF SYSTEMS:    ?Constitutional: Denies fevers, chills or abnormal night sweats ?Eyes: Denies blurriness of vision, double vision or watery eyes ?Ears, nose, mouth, throat, and face: Denies mucositis or sore throat ?Respiratory: Denies cough, dyspnea or wheezes ?Cardiovascular: Denies palpitation, chest discomfort or lower extremity swelling ?Gastrointestinal:  Denies nausea, heartburn or change in bowel habits, (+) abdominal bloating ?Skin: Denies abnormal skin rashes ?Lymphatics: Denies new lymphadenopathy or easy bruising ?Neurological:Denies numbness, tingling or new  weaknesses ?Behavioral/Psych: Mood is stable, no new changes  ?All other systems were reviewed with the patient and are negative. ? ? ?MEDICAL HISTORY:  ?Past Medical History:  ?Diagnosis Date  ? Aortic aneurysm ([place])   ? Arthritis   ? BPH (benign prostatic hypertrophy)   ? Cataract 2015  ? Cirrhosis ([place])   ? COPD (chronic obstructive pulmonary disease) ([place])   ? DDD (degenerative disc disease), lumbosacral   ? Elevated PSA   ? Emphysema of lung ([place])   ? Essential hypertension, benign   ? diet controlled  ? Fracture of shaft of clavicle   ? GERD (gastroesophageal reflux disease)   ? H/O drug abuse ([place])   ? multisubstance  ? Hepatitis B   ? Hepatitis C 10/1996  ? HSV-2 (herpes simplex virus 2) infection   ? Hx of biopsy 08/2004  ? liver  ? Hx of colonic polyps 2022  ? Hyperlipidemia   ? Inguinal hernia   ? right  ? Other and unspecified hyperlipidemia   ? Prostate cancer ([place])   ? Substance abuse ([place])   ? Tuberculosis   ? ? ?SURGICAL HISTORY: ?Past Surgical History:  ?Procedure Laterality Date  ? BACK SURGERY    ? CERVICAL SPINE SURGERY    ? EYE SURGERY  3/20  ? fracture ribs    ? 3  ? HERNIA REPAIR    ? INGUINAL HERNIA REPAIR  07/01/2012  ? Procedure: HERNIA  REPAIR INGUINAL ADULT;  Surgeon: Madilyn Hook, DO;  Location: [facility];  Service: General;  Laterality: Right;  with Mesh  ? left knee meniscus repair  07/21/1990  ? right rotator cuff  07/21/1990  ? right shoulder arthroscopy  07/21/2009  ? SPINE SURGERY  10/14  ? ? ?SOCIAL HISTORY: ?Social History  ? ?Socioeconomic History  ? Marital status: Married  ?  Spouse name: Not on file  ? Number of children: 1  ? Years of education: Not on file  ? Highest education level: Not on file  ?Occupational History  ? Occupation: Geophysicist/field seismologist  ? Occupation: Retired   ?  Employer: [employer]  ?  Comment: 20+ yrs   ?Tobacco Use  ? Smoking status: Former  ?  Packs/day: 1.50  ?  Years: 43.00  ?  Pack years: 64.50  ?  Types: Cigarettes  ?  Quit date: 02/22/2003  ?  Years since  quitting: 18.6  ? Smokeless tobacco: Never  ?Vaping Use  ? Vaping Use: Never used  ?Substance and Sexual Activity  ? Alcohol use: Not Currently  ?  Comment: 1988  ? Drug use: Not Currently  ?  Types: Cocaine, Heroin  ?  Comment

## 2021-10-14 NOTE — Progress Notes (Signed)
I met with George Watson and his wife before his consultation with Dr Burr Medico.  I explained my role as a nurse navigator and provided my contact information.  All questions were answered.  They verbalized understanding. ? ?

## 2021-10-15 DIAGNOSIS — M9902 Segmental and somatic dysfunction of thoracic region: Secondary | ICD-10-CM | POA: Diagnosis not present

## 2021-10-15 DIAGNOSIS — M9901 Segmental and somatic dysfunction of cervical region: Secondary | ICD-10-CM | POA: Diagnosis not present

## 2021-10-15 DIAGNOSIS — M47812 Spondylosis without myelopathy or radiculopathy, cervical region: Secondary | ICD-10-CM | POA: Diagnosis not present

## 2021-10-15 DIAGNOSIS — S39012A Strain of muscle, fascia and tendon of lower back, initial encounter: Secondary | ICD-10-CM | POA: Diagnosis not present

## 2021-10-15 DIAGNOSIS — M47817 Spondylosis without myelopathy or radiculopathy, lumbosacral region: Secondary | ICD-10-CM | POA: Diagnosis not present

## 2021-10-15 DIAGNOSIS — M9903 Segmental and somatic dysfunction of lumbar region: Secondary | ICD-10-CM | POA: Diagnosis not present

## 2021-10-15 LAB — AFP TUMOR MARKER: AFP, Serum, Tumor Marker: 8.5 ng/mL — ABNORMAL HIGH (ref 0.0–8.4)

## 2021-10-15 LAB — PSA, TOTAL AND FREE
PSA, Free Pct: 23.1 %
PSA, Free: 0.3 ng/mL
Prostate Specific Ag, Serum: 1.3 ng/mL (ref 0.0–4.0)

## 2021-10-16 ENCOUNTER — Encounter: Payer: Self-pay | Admitting: *Deleted

## 2021-10-16 ENCOUNTER — Telehealth: Payer: Self-pay | Admitting: Hematology

## 2021-10-16 ENCOUNTER — Other Ambulatory Visit: Payer: Self-pay

## 2021-10-16 DIAGNOSIS — C220 Liver cell carcinoma: Secondary | ICD-10-CM

## 2021-10-16 NOTE — Progress Notes (Signed)
The proposed treatment discussed in conference is for discussion purpose only and is not a binding recommendation.  The patients have not been physically examined, or presented with their treatment options.  Therefore, final treatment plans cannot be decided.  

## 2021-10-16 NOTE — Progress Notes (Signed)
Referral, demographics, insurance info, ov note faxed to [facility]. ? ?

## 2021-10-16 NOTE — Telephone Encounter (Signed)
We reviewed his images in our tumor board at this morning, radiologist to Dr. Jobe Igo feels his liver lesion could be LR-4, instead of 5, and his AFP is only slightly elevated and it has not changed since last year, so we recommend liver biopsy.  Dr. Zenia Resides feels this is resectable disease.  I called the patient after conference, he agrees with liver biopsy (he had a before), and see Dr. Zenia Resides.  Referrals were made today.  ? ?George Watson  ?10/16/2021  ?

## 2021-10-16 NOTE — Progress Notes (Unsigned)
Corrie Mckusick, DO  George Watson ?[place] for US guided liver mass biopsy.  ? ?Discordant results regarding AFP and imaging.    ? ?Thank you, no, does not need to be specifically for me.  ? ?[place]   ?  ?   ?Previous Messages ?  ?----- Message -----  ?From: George Watson  ?Sent: 10/16/2021   9:47 AM EDT  ?To: Corrie Mckusick, DO  ?Subject: US liver bx                                    ? ?Can you provide a review for me to scan in chart- does this need to be on your day?  ? ?US liver biopsy  ? ?Hepatocellular carcinoma  ? ?Dr. Earleen Newport approved the biopsy in conference today  ? ? ?Dr. Truitt Merle  ?[phone]  ?

## 2021-10-17 NOTE — Progress Notes (Signed)
George Watson called with questions regarding recommendations from GI conference on 3.29.2023.  I reviewed the discussion and recommendations.  All questions were answered.  He verbalized understanding.   ? ?

## 2021-10-23 ENCOUNTER — Ambulatory Visit: Payer: Self-pay | Admitting: Surgery

## 2021-10-23 ENCOUNTER — Other Ambulatory Visit: Payer: Self-pay | Admitting: Radiology

## 2021-10-23 DIAGNOSIS — R160 Hepatomegaly, not elsewhere classified: Secondary | ICD-10-CM | POA: Diagnosis not present

## 2021-10-23 DIAGNOSIS — B182 Chronic viral hepatitis C: Secondary | ICD-10-CM | POA: Diagnosis not present

## 2021-10-23 NOTE — H&P (View-Only) (Signed)
History of Present Illness: ?George Watson is a 76 y.o. male who was referred to me for evaluation of a liver mass. He has a history of hepatitis C (previously treated in 2014) and has been following with Dr. Havery Moros for surveillance ultrasounds. He recently had an [place] on 3/14 that showed a left liver mass, which was new compared to his prior ultrasound in December.  He then had an MRI of the liver on 3/17, which confirmed the presence of a 2.6cm mass in the left lateral liver, read as a LiRADS-5 lesion. He was referred to Dr. Burr Medico and his case was reviewed at multidisciplinary GI tumor board last week. On radiology re-review, the left liver mass was felt to be a LiRADS-4, and the decision was made to proceed with a percutaneous biopsy, which the patient is scheduled for tomorrow. The MRI also showed a subcentimeter hyper-enhancement in the right liver with no washout (L2). He had a CTA chest on 1/23 for surveillance of an aortic aneurysm, which showed no pulmonary metastases. ?  ?His AFP on 3/27 was 8.5. LFTs and coags on 3/6 were all normal. ?  ?His only prior abdominal surgery is a right inguinal hernia repair. He has a history of emphysema but is not on home oxygen and denies dyspnea on exertion. He occasionally takes a steroid taper when he has emphysema flairs. He has no history of CAD but is on a baby aspirin for a prior TIA. He has a history of low-grade prostate cancer and is under active surveillance. ?  ?  ?Review of Systems: ?A complete review of systems was obtained from the patient.  I have reviewed this information and discussed as appropriate with the patient.  See HPI as well for other ROS. ?  ?  ?  ?Medical History: ?Past Medical History ?Past Medical History: ?Diagnosis Date ? Arthritis   ? BPH (benign prostatic hyperplasia)   ? Colon polyp   ? COPD bronchitis   ? DVT (deep venous thrombosis) (CMS-HCC)   ? GERD (gastroesophageal reflux disease)   ? Hepatitis C, chronic (CMS-HCC)    ? Hyperlipidemia   ? Hypertension   ? Prostate cancer (CMS-HCC) 07/22/2007 ?  Active surveillance protocol ? Substance abuse (CMS-HCC)   ? Tuberculosis 07/21/1989 ?  INH x 1 year ? ?  ?  ?Patient Active Problem List ?Diagnosis ? Hepatitis C, chronic (CMS-HCC) ? Prostate cancer (CMS-HCC) ?  ?  ?Past Surgical History ?Past Surgical History: ?Procedure Laterality Date ? [place] Cuff Surgery   1991 ? Left Knee Surgery   1991 ? Neck Surgery   2007 ? Shoulder Surgery   2011 ? INGUINAL HERNIA REPAIR Right 06/20/2012 ? Back Surgery   2014 ? ?  ?  ?Allergies ?No Known Allergies ? ?  ?Current Outpatient Medications on File Prior to Visit ?Medication Sig Dispense Refill ? budesonide (ENTOCORT EC) 3 mg EC capsule budesonide DR - ER 3 mg capsule,delayed,extended release ? TAKE 3 CAPSULES BY MOUTH EVERY MORNING     ? hydroCHLOROthiazide (MICROZIDE) 12.5 mg capsule Take by mouth     ? losartan (COZAAR) 100 MG tablet losartan 100 mg tablet ? TAKE 1 TABLET BY MOUTH EVERY DAY     ? albuterol 90 mcg/actuation inhaler Inhale 2 inhalations into the lungs every 6 (six) hours as needed for Wheezing.     ? DOCOSAHEXANOIC ACID/EPA (FISH OIL ORAL) Take 3,000 mg by mouth daily.     ? dutasteride (AVODART) 0.5 mg capsule Take 0.5 mg by  mouth daily.     ? famotidine (PEPCID) 40 MG tablet Take 40 mg by mouth 2 (two) times daily.     ? GLUCOSAMINE SULFATE (GLUCOSAMINE ORAL) Take 3,000 mg by mouth. With 2400 mg of  chondroitin     ? metaxalone (SKELAXIN) 800 mg tablet continuously as needed.     ? MULTIVITAMIN W-MINERALS/LUTEIN (CENTRUM SILVER ORAL) Take by mouth daily.     ? oxybutynin (DITROPAN) 5 mg tablet Take 5 mg by mouth nightly.      ? QVAR 80 mcg/actuation inhaler Inhale 2 inhalations into the lungs once daily.      ? rosuvastatin (CRESTOR) 10 MG tablet rosuvastatin 10 mg tablet ? TAKE 1 TABLET BY MOUTH EVERY DAY     ? tamsulosin (FLOMAX) 0.4 mg capsule Take 0.4 mg by mouth daily. Take 30 minutes after same meal each day.      ? umeclidinium-vilanteroL (ANORO ELLIPTA) 62.5-25 mcg/actuation inhaler Anoro Ellipta 62.5 mcg-25 mcg/actuation powder for inhalation ? INHALE 1 PUFF BY MOUTH EVERY DAY     ? VIAGRA 100 mg tablet continuously as needed.     ?  ?No current facility-administered medications on file prior to visit. ?  ?  ?Family History ?Family History ?Problem Relation Age of Onset ? Breast cancer Sister   ? Liver disease Neg Hx   ? Liver cancer Neg Hx   ? Cirrhosis Neg Hx   ? ?  ?  ?Social History ?  ?Tobacco Use ?Smoking Status Former ? Types: Cigarettes ? Quit date: 07/21/2002 ? Years since quitting: 19.2 ?Smokeless Tobacco Not on file ?  ?  ?Social History ?Social History ?  ? ?Socioeconomic History ? Marital status: Married ?    Spouse name: Truman Hayward ? Number of children: 0 ? Years of education: 24 ?Occupational History ? Occupation: Retired ?    Comment: Prior Scientist, clinical (histocompatibility and immunogenetics) ?Tobacco Use ? Smoking status: Former ?    Types: Cigarettes ?    Quit date: 07/21/2002 ?    Years since quitting: 19.2 ?Substance and Sexual Activity ? Alcohol use: No ?    Comment: [place] abuse, in 12 step program ? Drug use: No ?    Comment: Remote drug ? Sexual activity: Yes ?    Partners: Female ?    Comment: Wife HCV negative over 10 years ago ? ?  ?  ?Objective: ?  ?  ?Vitals: ?  10/23/21 1343 ?BP: 118/80 ?Pulse: 103 ?Temp: 36.8 ?C (98.2 ?F) ?SpO2: 97% ?Weight: 78.1 kg (172 lb 3.2 oz) ?Height: 180.3 cm ('5\' 11"'$ ) ?  ?Body mass index is 24.02 kg/m?. ?  ?Physical Exam ?Vitals reviewed.  ?Constitutional:   ?   General: He is not in acute distress. ?   Appearance: Normal appearance.  ?HENT:  ?   Head: Normocephalic and atraumatic.  ?Eyes:  ?   General: No scleral icterus. ?   Conjunctiva/sclera: Conjunctivae normal.  ?Cardiovascular:  ?   Rate and Rhythm: Normal rate and regular rhythm.  ?   Heart sounds: No murmur heard. ?Pulmonary:  ?   Effort: Pulmonary effort is normal. No respiratory distress.  ?   Breath sounds: Normal breath sounds.  ?   Comments: Very  mild wheezing ?Abdominal:  ?   General: There is no distension.  ?   Palpations: Abdomen is soft.  ?   Comments: No hepatomegaly.  ?Musculoskeletal:     ?   General: Normal range of motion.  ?Skin: ?   General: Skin is warm  and dry.  ?   Coloration: Skin is not jaundiced.  ?Neurological:  ?   General: No focal deficit present.  ?   Mental Status: He is alert and oriented to person, place, and time.  ?Psychiatric:     ?   Mood and Affect: Mood normal.     ?   Behavior: Behavior normal.     ?   Thought Content: Thought content normal.  ?  ?  ?  ?  ?  ?Labs, Imaging and Diagnostic Testing: ?IMPRESSION: ?1. Mass in the left hepatic lobe segment 3 as described, consistent ?with hepatocellular carcinoma, LR 5. ?2. Subcentimeter focus of early hyperenhancement in the right ?hepatic lobe without washout or corresponding signal abnormalities, ?LR 2. ?  ?Assessment and Plan: ?Diagnoses and all orders for this visit: ?  ?Liver mass, left lobe ?  ?Chronic hepatitis C without hepatic coma (CMS-HCC) ?  ?  ?This is a 76 yo male presenting with a new left liver mass. I personally reviewed his referral reports, imaging, and labs. He has an approximately 2cm mass in segment 3 of the left liver. His liver is somewhat nodular, but there are no signs of portal hypertension. Given his prior history of hepatitis C with possible early cirrhosis, this is highly likely to represent an [place]. It was initially read as a LiRADS-5, but on [place] review LiRADS-4 was felt to be a more appropriate designation by radiology. Given his history of prostate cancer, the decision was made to proceed with biopsy, although the imaging findings are not characteristic of a metastatic lesion. Assuming this is a primary liver tumor (either [place] or intrahepatic cholangiocarcinoma), I recommended proceeding with resection. If the patient does have early cirrhosis, it is well-compensated with normal liver function (Child-Pugh A). I reviewed the details of a staging  laparoscopy and laparoscopic-assisted vs open partial left hepatectomy. I reviewed the risks including bleeding and infection, and counseled that he can expect a 2-3 day hospital stay postoperatively. I will follow up the results of hi

## 2021-10-23 NOTE — H&P (Signed)
History of Present Illness: ?George Watson is a 76 y.o. male who was referred to me for evaluation of a liver mass. He has a history of hepatitis C (previously treated in 2014) and has been following with Dr. Havery Moros for surveillance ultrasounds. He recently had an [place] on 3/14 that showed a left liver mass, which was new compared to his prior ultrasound in December.  He then had an MRI of the liver on 3/17, which confirmed the presence of a 2.6cm mass in the left lateral liver, read as a LiRADS-5 lesion. He was referred to Dr. Burr Medico and his case was reviewed at multidisciplinary GI tumor board last week. On radiology re-review, the left liver mass was felt to be a LiRADS-4, and the decision was made to proceed with a percutaneous biopsy, which the patient is scheduled for tomorrow. The MRI also showed a subcentimeter hyper-enhancement in the right liver with no washout (L2). He had a CTA chest on 1/23 for surveillance of an aortic aneurysm, which showed no pulmonary metastases. ?  ?His AFP on 3/27 was 8.5. LFTs and coags on 3/6 were all normal. ?  ?His only prior abdominal surgery is a right inguinal hernia repair. He has a history of emphysema but is not on home oxygen and denies dyspnea on exertion. He occasionally takes a steroid taper when he has emphysema flairs. He has no history of CAD but is on a baby aspirin for a prior TIA. He has a history of low-grade prostate cancer and is under active surveillance. ?  ?  ?Review of Systems: ?A complete review of systems was obtained from the patient.  I have reviewed this information and discussed as appropriate with the patient.  See HPI as well for other ROS. ?  ?  ?  ?Medical History: ?Past Medical History ?Past Medical History: ?Diagnosis Date ? Arthritis   ? BPH (benign prostatic hyperplasia)   ? Colon polyp   ? COPD bronchitis   ? DVT (deep venous thrombosis) (CMS-HCC)   ? GERD (gastroesophageal reflux disease)   ? Hepatitis C, chronic (CMS-HCC)    ? Hyperlipidemia   ? Hypertension   ? Prostate cancer (CMS-HCC) 07/22/2007 ?  Active surveillance protocol ? Substance abuse (CMS-HCC)   ? Tuberculosis 07/21/1989 ?  INH x 1 year ? ?  ?  ?Patient Active Problem List ?Diagnosis ? Hepatitis C, chronic (CMS-HCC) ? Prostate cancer (CMS-HCC) ?  ?  ?Past Surgical History ?Past Surgical History: ?Procedure Laterality Date ? [place] Cuff Surgery   1991 ? Left Knee Surgery   1991 ? Neck Surgery   2007 ? Shoulder Surgery   2011 ? INGUINAL HERNIA REPAIR Right 06/20/2012 ? Back Surgery   2014 ? ?  ?  ?Allergies ?No Known Allergies ? ?  ?Current Outpatient Medications on File Prior to Visit ?Medication Sig Dispense Refill ? budesonide (ENTOCORT EC) 3 mg EC capsule budesonide DR - ER 3 mg capsule,delayed,extended release ? TAKE 3 CAPSULES BY MOUTH EVERY MORNING     ? hydroCHLOROthiazide (MICROZIDE) 12.5 mg capsule Take by mouth     ? losartan (COZAAR) 100 MG tablet losartan 100 mg tablet ? TAKE 1 TABLET BY MOUTH EVERY DAY     ? albuterol 90 mcg/actuation inhaler Inhale 2 inhalations into the lungs every 6 (six) hours as needed for Wheezing.     ? DOCOSAHEXANOIC ACID/EPA (FISH OIL ORAL) Take 3,000 mg by mouth daily.     ? dutasteride (AVODART) 0.5 mg capsule Take 0.5 mg by  mouth daily.     ? famotidine (PEPCID) 40 MG tablet Take 40 mg by mouth 2 (two) times daily.     ? GLUCOSAMINE SULFATE (GLUCOSAMINE ORAL) Take 3,000 mg by mouth. With 2400 mg of  chondroitin     ? metaxalone (SKELAXIN) 800 mg tablet continuously as needed.     ? MULTIVITAMIN W-MINERALS/LUTEIN (CENTRUM SILVER ORAL) Take by mouth daily.     ? oxybutynin (DITROPAN) 5 mg tablet Take 5 mg by mouth nightly.      ? QVAR 80 mcg/actuation inhaler Inhale 2 inhalations into the lungs once daily.      ? rosuvastatin (CRESTOR) 10 MG tablet rosuvastatin 10 mg tablet ? TAKE 1 TABLET BY MOUTH EVERY DAY     ? tamsulosin (FLOMAX) 0.4 mg capsule Take 0.4 mg by mouth daily. Take 30 minutes after same meal each day.      ? umeclidinium-vilanteroL (ANORO ELLIPTA) 62.5-25 mcg/actuation inhaler Anoro Ellipta 62.5 mcg-25 mcg/actuation powder for inhalation ? INHALE 1 PUFF BY MOUTH EVERY DAY     ? VIAGRA 100 mg tablet continuously as needed.     ?  ?No current facility-administered medications on file prior to visit. ?  ?  ?Family History ?Family History ?Problem Relation Age of Onset ? Breast cancer Sister   ? Liver disease Neg Hx   ? Liver cancer Neg Hx   ? Cirrhosis Neg Hx   ? ?  ?  ?Social History ?  ?Tobacco Use ?Smoking Status Former ? Types: Cigarettes ? Quit date: 07/21/2002 ? Years since quitting: 19.2 ?Smokeless Tobacco Not on file ?  ?  ?Social History ?Social History ?  ? ?Socioeconomic History ? Marital status: Married ?    Spouse name: Truman Hayward ? Number of children: 0 ? Years of education: 66 ?Occupational History ? Occupation: Retired ?    Comment: Prior Scientist, clinical (histocompatibility and immunogenetics) ?Tobacco Use ? Smoking status: Former ?    Types: Cigarettes ?    Quit date: 07/21/2002 ?    Years since quitting: 19.2 ?Substance and Sexual Activity ? Alcohol use: No ?    Comment: Prio abuse, in 12 step program ? Drug use: No ?    Comment: Remote drug ? Sexual activity: Yes ?    Partners: Female ?    Comment: Wife HCV negative over 10 years ago ? ?  ?  ?Objective: ?  ?  ?Vitals: ?  10/23/21 1343 ?BP: 118/80 ?Pulse: 103 ?Temp: 36.8 ?C (98.2 ?F) ?SpO2: 97% ?Weight: 78.1 kg (172 lb 3.2 oz) ?Height: 180.3 cm ('5\' 11"'$ ) ?  ?Body mass index is 24.02 kg/m?. ?  ?Physical Exam ?Vitals reviewed.  ?Constitutional:   ?   General: He is not in acute distress. ?   Appearance: Normal appearance.  ?HENT:  ?   Head: Normocephalic and atraumatic.  ?Eyes:  ?   General: No scleral icterus. ?   Conjunctiva/sclera: Conjunctivae normal.  ?Cardiovascular:  ?   Rate and Rhythm: Normal rate and regular rhythm.  ?   Heart sounds: No murmur heard. ?Pulmonary:  ?   Effort: Pulmonary effort is normal. No respiratory distress.  ?   Breath sounds: Normal breath sounds.  ?   Comments: Very  mild wheezing ?Abdominal:  ?   General: There is no distension.  ?   Palpations: Abdomen is soft.  ?   Comments: No hepatomegaly.  ?Musculoskeletal:     ?   General: Normal range of motion.  ?Skin: ?   General: Skin is warm  and dry.  ?   Coloration: Skin is not jaundiced.  ?Neurological:  ?   General: No focal deficit present.  ?   Mental Status: He is alert and oriented to person, place, and time.  ?Psychiatric:     ?   Mood and Affect: Mood normal.     ?   Behavior: Behavior normal.     ?   Thought Content: Thought content normal.  ?  ?  ?  ?  ?  ?Labs, Imaging and Diagnostic Testing: ?IMPRESSION: ?1. Mass in the left hepatic lobe segment 3 as described, consistent ?with hepatocellular carcinoma, LR 5. ?2. Subcentimeter focus of early hyperenhancement in the right ?hepatic lobe without washout or corresponding signal abnormalities, ?LR 2. ?  ?Assessment and Plan: ?Diagnoses and all orders for this visit: ?  ?Liver mass, left lobe ?  ?Chronic hepatitis C without hepatic coma (CMS-HCC) ?  ?  ?This is a 76 yo male presenting with a new left liver mass. I personally reviewed his referral reports, imaging, and labs. He has an approximately 2cm mass in segment 3 of the left liver. His liver is somewhat nodular, but there are no signs of portal hypertension. Given his prior history of hepatitis C with possible early cirrhosis, this is highly likely to represent an [place]. It was initially read as a LiRADS-5, but on [place] review LiRADS-4 was felt to be a more appropriate designation by radiology. Given his history of prostate cancer, the decision was made to proceed with biopsy, although the imaging findings are not characteristic of a metastatic lesion. Assuming this is a primary liver tumor (either [place] or intrahepatic cholangiocarcinoma), I recommended proceeding with resection. If the patient does have early cirrhosis, it is well-compensated with normal liver function (Child-Pugh A). I reviewed the details of a staging  laparoscopy and laparoscopic-assisted vs open partial left hepatectomy. I reviewed the risks including bleeding and infection, and counseled that he can expect a 2-3 day hospital stay postoperatively. I will follow up the results of hi

## 2021-10-24 ENCOUNTER — Ambulatory Visit (HOSPITAL_COMMUNITY)
Admission: RE | Admit: 2021-10-24 | Discharge: 2021-10-24 | Disposition: A | Discharge status: Home / Self Care | Payer: PPO | Source: Ambulatory Visit | Attending: Hematology | Admitting: Hematology

## 2021-10-24 ENCOUNTER — Other Ambulatory Visit: Payer: Self-pay

## 2021-10-24 ENCOUNTER — Encounter (HOSPITAL_COMMUNITY): Payer: Self-pay

## 2021-10-24 DIAGNOSIS — Z8546 Personal history of malignant neoplasm of prostate: Secondary | ICD-10-CM | POA: Diagnosis not present

## 2021-10-24 DIAGNOSIS — C220 Liver cell carcinoma: Secondary | ICD-10-CM | POA: Diagnosis not present

## 2021-10-24 DIAGNOSIS — K7689 Other specified diseases of liver: Secondary | ICD-10-CM | POA: Diagnosis not present

## 2021-10-24 LAB — CBC
HCT: 42.9 % (ref 39.0–52.0)
Hemoglobin: 15.1 g/dL (ref 13.0–17.0)
MCH: 31.9 pg (ref 26.0–34.0)
MCHC: 35.2 g/dL (ref 30.0–36.0)
MCV: 90.5 fL (ref 80.0–100.0)
Platelets: 211 10*3/uL (ref 150–400)
RBC: 4.74 MIL/uL (ref 4.22–5.81)
RDW: 13 % (ref 11.5–15.5)
WBC: 9.1 10*3/uL (ref 4.0–10.5)
nRBC: 0 % (ref 0.0–0.2)

## 2021-10-24 LAB — APTT: aPTT: 26 seconds (ref 24–36)

## 2021-10-24 LAB — PROTIME-INR
INR: 1 (ref 0.8–1.2)
Prothrombin Time: 13.3 seconds (ref 11.4–15.2)

## 2021-10-24 MED ORDER — SODIUM CHLORIDE 0.9 % IV SOLN
INTRAVENOUS | Status: DC
Start: 2021-10-24 — End: 2021-10-25

## 2021-10-24 MED ORDER — MIDAZOLAM HCL 2 MG/2ML IJ SOLN
INTRAMUSCULAR | Status: AC
  Filled 2021-10-24: qty 4

## 2021-10-24 MED ORDER — GELATIN ABSORBABLE 12-7 MM EX MISC
CUTANEOUS | Status: AC
  Administered 2021-10-24: 1
  Filled 2021-10-24: qty 1

## 2021-10-24 MED ORDER — LIDOCAINE HCL 1 % IJ SOLN
INTRAMUSCULAR | Status: AC
  Administered 2021-10-24: 10 mL
  Filled 2021-10-24: qty 20

## 2021-10-24 MED ORDER — FENTANYL CITRATE (PF) 100 MCG/2ML IJ SOLN
INTRAMUSCULAR | Status: AC
  Filled 2021-10-24: qty 2

## 2021-10-24 MED ORDER — FENTANYL CITRATE (PF) 100 MCG/2ML IJ SOLN
INTRAMUSCULAR | Status: AC | PRN
  Administered 2021-10-24 (×2): 50 ug via INTRAVENOUS

## 2021-10-24 MED ORDER — MIDAZOLAM HCL 2 MG/2ML IJ SOLN
INTRAMUSCULAR | Status: AC | PRN
  Administered 2021-10-24 (×3): 1 mg via INTRAVENOUS

## 2021-10-24 NOTE — Progress Notes (Addendum)
Patient concerned of delay for procedure and wanting to go home.   K. Allred PA in to talk with pt.   In discussion with IR , Dr. Anselm Pancoast coming from [facility] to assist with procedures. ?Pt. Is agreeable to stay for procedure. ?

## 2021-10-24 NOTE — H&P (Addendum)
? ? ?Referring Physician(s): ?Feng,Yan ? ?Supervising Physician: Henn,A ? ?Patient Status:  [facility] ? ?Chief Complaint: ? ?"I'm having a liver biopsy" ? ?Subjective: ?Patient familiar to IR service from random liver biopsy in 2017.  Past medical history significant for aortic aneurysm, arthritis, BPH with prostate cancer, cirrhosis, COPD, degenerative disc disease, hypertension, GERD, hepatitis B/C with prior drug abuse, hyperlipidemia who presents now with a mass in the left hepatic lobe segment 3 consistent with [facility] as well as a subcentimeter focus of early hyperenhancement in the right hepatic lobe without washout or corresponding signal abnormalities.  He is scheduled today for image guided liver lesion biopsy for pathological confirmation.  He currently denies fever, headache, chest pain, dyspnea, cough, abdominal/back pain, nausea, vomiting or bleeding.  He does have easy bruising. ? ?Past Medical History:  ?Diagnosis Date  ? Aortic aneurysm ([place])   ? Arthritis   ? BPH (benign prostatic hypertrophy)   ? Cataract 2015  ? Cirrhosis ([place])   ? COPD (chronic obstructive pulmonary disease) ([place])   ? DDD (degenerative disc disease), lumbosacral   ? Elevated PSA   ? Emphysema of lung ([place])   ? Essential hypertension, benign   ? diet controlled  ? Fracture of shaft of clavicle   ? GERD (gastroesophageal reflux disease)   ? H/O drug abuse ([place])   ? multisubstance  ? Hepatitis B   ? Hepatitis C 10/1996  ? HSV-2 (herpes simplex virus 2) infection   ? Hx of biopsy 08/2004  ? liver  ? Hx of colonic polyps 2022  ? Hyperlipidemia   ? Inguinal hernia   ? right  ? Other and unspecified hyperlipidemia   ? Prostate cancer ([place])   ? Substance abuse ([place])   ? Tuberculosis   ? ?Past Surgical History:  ?Procedure Laterality Date  ? BACK SURGERY    ? CERVICAL SPINE SURGERY    ? EYE SURGERY  3/20  ? fracture ribs    ? 3  ? HERNIA REPAIR    ? INGUINAL HERNIA REPAIR  07/01/2012  ? Procedure: HERNIA REPAIR INGUINAL ADULT;  Surgeon: Madilyn Hook, DO;  Location: [facility];  Service: General;  Laterality: Right;  with Mesh  ? left knee meniscus repair  07/21/1990  ? right rotator cuff  07/21/1990  ? right shoulder arthroscopy  07/21/2009  ? SPINE SURGERY  10/14  ? ? ? ? ?Allergies: ?Patient has no known allergies. ? ?Medications: ?Prior to Admission medications   ?Medication Sig Start Date End Date Taking? Authorizing Provider  ?albuterol (PROVENTIL) (2.5 MG/3ML) 0.083% nebulizer solution Take 3 mLs (2.5 mg total) by nebulization every 6 (six) hours as needed for wheezing or shortness of breath. 05/29/21 05/29/22 Yes Parrett, Fonnie Mu, NP  ?B Complex Vitamins (B COMPLEX PO) Take 1 tablet by mouth daily.   Yes [provider]  ?cholecalciferol (VITAMIN D3) 25 MCG (1000 UT) tablet Take 1,000 Units by mouth daily.   Yes [provider]  ?Coenzyme Q10-Vitamin E 100-300 MG-UNIT CHEW Chew 1 tablet by mouth daily.   Yes [provider]  ?diphenoxylate-atropine (LOMOTIL) 2.5-0.025 MG tablet Take 1 tablet by mouth 4 (four) times daily as needed for diarrhea or loose stools. 07/25/21  Yes Armbruster, Carlota Raspberry, MD  ?dutasteride (AVODART) 0.5 MG capsule Take 0.5 mg by mouth daily.   Yes [provider]  ?famotidine (PEPCID) 20 MG tablet Take 20 mg by mouth every evening.    Yes [provider]  ?Fluticasone-Umeclidin-Vilant (TRELEGY ELLIPTA)  100-62.5-25 MCG/ACT AEPB TAKE 1 PUFF BY MOUTH EVERY DAY 06/25/21  Yes Collene Gobble, MD  ?glucosamine-chondroitin 500-400 MG tablet Take 3 tablets by mouth daily. ?Patient taking differently: Take 2 tablets by mouth daily. 02/15/18  Yes Marrian Salvage, [place]  ?hydrochlorothiazide (MICROZIDE) 12.5 MG capsule Take 1 capsule (12.5 mg total) by mouth daily. 11/23/20  Yes [name], MD  ?losartan (COZAAR) 100 MG tablet TAKE 1 TABLET BY MOUTH EVERY DAY 08/04/21  Yes de [place], Raymond J, MD  ?metaxalone (SKELAXIN) 800 MG tablet TAKE 1 TABLET (800 MG TOTAL) BY MOUTH 3 (THREE) TIMES DAILY AS  NEEDED FOR MUSCLE SPASMS. 09/23/21  Yes de [place], Raymond J, MD  ?Multiple Vitamins-Minerals (CENTRUM SILVER PO) Take 1 tablet by mouth daily.   Yes [provider]  ?predniSONE (STERAPRED UNI-PAK 21 TAB) 10 MG (21) TBPK tablet Take by mouth daily. Take 6 tabs by mouth daily  for 2 days, then 5 tabs for 2 days, then 4 tabs for 2 days, then 3 tabs for 2 days, 2 tabs for 2 days, then 1 tab by mouth daily for 2 days 10/05/21  Yes Hans Eden, NP  ?rosuvastatin (CRESTOR) 5 MG tablet TAKE 1 TABLET (5 MG TOTAL) BY MOUTH DAILY. 10/08/21  Yes de [place], Raymond J, MD  ?tamsulosin (FLOMAX) 0.4 MG CAPS capsule Take 0.4 mg by mouth daily.    Yes [provider]  ?TURMERIC CURCUMIN PO Take 1 tablet by mouth daily.   Yes [provider]  ?albuterol (VENTOLIN HFA) 108 (90 Base) MCG/ACT inhaler INHALE 2 PUFFS INTO THE LUNGS EVERY 6 HOURS AS NEEDED FOR SHORTNESS OF BREATH 08/04/21   de [place], Blondell Reveal, MD  ?aspirin EC 81 MG EC tablet Take 1 tablet (81 mg total) by mouth daily. 07/09/19   Black, Lezlie Octave, NP  ?benzonatate (TESSALON) 200 MG capsule Take 1 capsule (200 mg total) by mouth 3 (three) times daily as needed for cough. 05/21/21 05/21/22  Parrett, Fonnie Mu, NP  ?diazepam (VALIUM) 2 MG tablet Take 1-2 tabs 30 minutes before flight. 07/23/21   de [place], Raymond J, MD  ?mupirocin cream (BACTROBAN) 2 % Apply 1 application topically 2 (two) times daily. 04/15/20   [name], MD  ?Omega-3 Fatty Acids (FISH OIL PO) Take 1 tablet by mouth daily.    [provider]  ? ? ? ?Vital Signs: ?BP 130/80   Pulse 71   Temp 98.1 ?F (36.7 ?C) (Oral)   Resp 18   Ht '5\' 11"'$  (1.803 m)   Wt 168 lb (76.2 kg)   SpO2 98%   BMI 23.43 kg/m?  ? ?Physical Exam awake, alert.  Chest with distant but clear breath sounds bilaterally.  Heart with regular rate and rhythm.  Abdomen soft, positive bowel sounds, nontender.  No lower extremity edema. ? ?Imaging: ?No results found. ? ?Labs: ? ?CBC: ?Recent Labs  ?  05/21/21 ?1500  07/08/21 ?1504 10/14/21 ?1606 10/24/21 ?1125  ?WBC 12.5* 10.7 12.1* 9.1  ?HGB 15.6 15.9 14.9 15.1  ?HCT 46.9 46.8 43.3 42.9  ?PLT 253.0 268 279 211  ? ? ?COAGS: ?Recent Labs  ?  11/29/20 ?1537 09/23/21 ?1520 10/24/21 ?1125  ?INR 1.1* 1.0 1.0  ?APTT  --   --  26  ? ? ?BMP: ?Recent Labs  ?  05/21/21 ?1500 07/08/21 ?1504 09/23/21 ?1520  ?NA 137 141 138  ?K 3.8 4.6 3.8  ?CL 103 102 102  ?CO2 '28 24 24  '$ ?GLUCOSE 113* 102* 96  ?  BUN 27* 19 22  ?CALCIUM 9.9 10.5* 9.9  ?CREATININE 1.04 0.96 0.90  ? ? ?LIVER FUNCTION TESTS: ?Recent Labs  ?  07/08/21 ?1504 09/23/21 ?1520  ?BILITOT 0.5 0.6  ?AST 26 23  ?ALT 28 28  ?ALKPHOS 57 39  ?PROT 7.1 7.5  ?ALBUMIN 4.7 4.4  ? ? ?Assessment and Plan: ?Patient familiar to IR service from random liver biopsy in 2017.  Past medical history significant for aortic aneurysm, arthritis, BPH with prostate cancer, cirrhosis, COPD, degenerative disc disease, hypertension, GERD, hepatitis B/C with prior drug abuse, hyperlipidemia who presents now with a mass in the left hepatic lobe segment 3 consistent with [facility] as well as a subcentimeter focus of early hyperenhancement in the right hepatic lobe without washout or corresponding signal abnormalities.  He is scheduled today for image guided liver lesion biopsy for pathological confirmation.Risks and benefits of procedure was discussed with the patient including, but not limited to bleeding, infection, damage to adjacent structures or low yield requiring additional tests. ? ?All of the questions were answered and there is agreement to proceed. ? ?Consent signed and in chart. ? ? ? ?Electronically Signed: ?Autumn Messing, PA-C ?10/24/2021, 12:48 PM ? ? ?I spent a total of 25 minutes at the the patient's bedside AND on the patient's hospital floor or unit, greater than 50% of which was counseling/coordinating care for image guided liver mass biopsy ? ? ? ? ? ?

## 2021-10-24 NOTE — Procedures (Signed)
Interventional Radiology Procedure: ? ? ?Indications: Suspicious liver lesion ? ?Procedure: US guided liver lesion biopsy ? ?Findings: 4 core biopsies obtained from left hepatic lesion.  ? ?Complications: No immediate complications noted. ?    ?EBL: Minimal ? ?Plan: Bedrest 3 hours ? ? ?Jemeka Wagler R. Anselm Pancoast, MD  ?Pager: [phone] ? ? ? ?  ?

## 2021-10-24 NOTE — Progress Notes (Addendum)
Patient informed that IR running about 1 hour behind.  Patient voiced understanding.  Patient texted his wife to let her know. ?

## 2021-10-24 NOTE — Discharge Instructions (Signed)
For questions /concerns may call Interventional Radiology at [phone] ? ?You may remove your dressing and shower tomorrow afternoon ? ?    ? ? ?Liver Biopsy, Care After ?After a liver biopsy, it is common to have these things in the area where the biopsy was done. You may: ?Have pain. ?Feel sore. ?Have bruising. ?You may also feel tired for a few days. ?Follow these instructions at home: ?Medicines ?Take over-the-counter and prescription medicines only as told by your doctor. ?If you were prescribed an antibiotic medicine, take it as told by your doctor. Do not stop taking the antibiotic, even if you start to feel better. ?Do not take medicines that may thin your blood. These medicines include aspirin and ibuprofen. Take them only if your doctor tells you to. ?If told, take steps to prevent problems with pooping (constipation). You may need to: ?Drink enough fluid to keep your pee (urine) pale yellow. ?Take medicines. You will be told what medicines to take. ?Eat foods that are high in fiber. These include beans, whole grains, and fresh fruits and vegetables. ?Limit foods that are high in fat and sugar. These include fried or sweet foods. ?Ask your doctor if you should avoid driving or using machines while you are taking your medicine. ?Caring for your incision ?Follow instructions from your doctor about how to take care of your cut from surgery (incisions). Make sure you: ?Wash your hands with soap and water for at least 20 seconds before and after you change your bandage. If you cannot use soap and water, use hand sanitizer. ?Change your bandage. ?Leavestitches or skin glue in place for at least two weeks. ?Leave tape strips alone unless you are told to take them off. You may trim the edges of the tape strips if they curl up. ?Check your incision every day for signs of infection. Check for: ?Redness, swelling, or more pain. ?Fluid or blood. ?Warmth. ?Pus or a bad smell. ?Do not take baths, swim, or use a hot  tub. Ask your doctor about taking showers or sponge baths. ?Activity ?Rest at home for 1-2 days, or as told by your doctor. ?Get up to take short walks every 1 to 2 hours. Ask for help if you feel weak or unsteady. ?Do not lift anything that is heavier than 10 lb (4.5 kg), or the limit that you are told. ?Do not play contact sports for 2 weeks after the procedure. ?Return to your normal activities as told by your doctor. Ask what activities are safe for you. ?General instructions ?Do not drink alcohol in the first week after the procedure. ?Plan to have a responsible adult care for you for the time you are told after you leave the hospital or clinic. This is important. ?It is up to you to get the results of your procedure. Ask how to get your results when they are ready. ?Keep all follow-up visits. ?  ?Contact a doctor if: ?You have more bleeding in your incision. ?Your incision swells, or is red and more painful. ?You have fluid that comes from your incision. ?You develop a rash. ?You have fever or chills. ?Get help right away if: ?You have swelling, bloating, or pain in your belly (abdomen). ?You get dizzy or faint. ?You vomit or you feel like vomiting. ?You have trouble breathing or feel short of breath. ?You have chest pain. ?You have problems talking or seeing. ?You have trouble with your balance or moving your arms or legs. ?These symptoms may be an emergency.  Get help right away. Call your local emergency services (911 in the [place] ?Do not wait to see if the symptoms will go away. ?Do not drive yourself to the hospital. ?Summary ?After the procedure, it is common to have pain, soreness, bruising, and tiredness. ?Your doctor will tell you how to take care of yourself at home. Change your bandage, take your medicines, and limit your activities as told by your doctor. ?Call your doctor if you have symptoms of infection. Get help right away if your belly swells, your cut bleeds a lot, or you have trouble talking  or breathing. ?This information is not intended to replace advice given to you by your health care provider. Make sure you discuss any questions you have with your healthcare provider. ?Document Revised: 05/21/2020 Document Reviewed: 05/21/2020 ?Elsevier Patient Education ? 2022 [place]. ?   ?  ? ? ?Moderate Conscious Sedation, Adult, Care After ?This sheet gives you information about how to care for yourself after your procedure. Your health care provider may also give you more specific instructions. If you have problems or questions, contact your health careprovider. ?What can I expect after the procedure? ?After the procedure, it is common to have: ?Sleepiness for several hours. ?Impaired judgment for several hours. ?Difficulty with balance. ?Vomiting if you eat too soon. ?Follow these instructions at home: ?For the time period you were told by your health care provider: ?Rest. ?Do not participate in activities where you could fall or become injured. ?Do not drive or use machinery. ?Do not drink alcohol. ?Do not take sleeping pills or medicines that cause drowsiness. ?Do not make important decisions or sign legal documents. ?Do not take care of children on your own. ?Eating and drinking ? ?Follow the diet recommended by your health care provider. ?Drink enough fluid to keep your urine pale yellow. ?If you vomit: ?Drink water, juice, or soup when you can drink without vomiting. ?Make sure you have little or no nausea before eating solid foods. ? ?General instructions ?Take over-the-counter and prescription medicines only as told by your health care provider. ?Have a responsible adult stay with you for the time you are told. It is important to have someone help care for you until you are awake and alert. ?Do not smoke. ?Keep all follow-up visits as told by your health care provider. This is important. ?Contact a health care provider if: ?You are still sleepy or having trouble with balance after 24 hours. ?You  feel light-headed. ?You keep feeling nauseous or you keep vomiting. ?You develop a rash. ?You have a fever. ?You have redness or swelling around the IV site. ?Get help right away if: ?You have trouble breathing. ?You have new-onset confusion at home. ?Summary ?After the procedure, it is common to feel sleepy, have impaired judgment, or feel nauseous if you eat too soon. ?Rest after you get home. Know the things you should not do after the procedure. ?Follow the diet recommended by your health care provider and drink enough fluid to keep your urine pale yellow. ?Get help right away if you have trouble breathing or new-onset confusion at home. ?This information is not intended to replace advice given to you by your health care provider. Make sure you discuss any questions you have with your healthcare provider. ?Document Revised: 11/04/2019 Document Reviewed: 06/02/2019 ?Elsevier Patient Education ? [place].  ?

## 2021-10-25 ENCOUNTER — Telehealth: Payer: Self-pay | Admitting: Hematology

## 2021-10-25 LAB — SURGICAL PATHOLOGY

## 2021-10-25 NOTE — Telephone Encounter (Signed)
I called pt and spoke with him about his liver biopsy result, which showed moderately differentiated hepatocellular carcinoma.  He is doing well after biopsy.  He has seen Dr. Zenia Resides 2 days ago, and the plan to move forward with liver resection.  I will copy Dr. Zenia Resides.  I will see him as needed in the future.  He voiced good understanding, and appreciated the call. ? ?George Watson  ?10/25/2021  ?

## 2021-10-27 ENCOUNTER — Encounter: Payer: Self-pay | Admitting: Gastroenterology

## 2021-10-28 DIAGNOSIS — J441 Chronic obstructive pulmonary disease with (acute) exacerbation: Secondary | ICD-10-CM | POA: Diagnosis not present

## 2021-10-30 ENCOUNTER — Other Ambulatory Visit: Payer: Self-pay | Admitting: Family Medicine

## 2021-10-31 ENCOUNTER — Other Ambulatory Visit (HOSPITAL_BASED_OUTPATIENT_CLINIC_OR_DEPARTMENT_OTHER): Payer: Self-pay | Admitting: Family Medicine

## 2021-10-31 DIAGNOSIS — I10 Essential (primary) hypertension: Secondary | ICD-10-CM

## 2021-11-04 ENCOUNTER — Encounter (HOSPITAL_BASED_OUTPATIENT_CLINIC_OR_DEPARTMENT_OTHER): Payer: Self-pay | Admitting: Family Medicine

## 2021-11-05 NOTE — Telephone Encounter (Signed)
LVM for pt to cb to schedule appt. ?

## 2021-11-06 ENCOUNTER — Ambulatory Visit (HOSPITAL_COMMUNITY)
Admission: RE | Admit: 2021-11-06 | Discharge: 2021-11-06 | Disposition: A | Discharge status: Home / Self Care | Payer: PPO | Source: Ambulatory Visit | Attending: Nurse Practitioner | Admitting: Nurse Practitioner

## 2021-11-06 ENCOUNTER — Encounter (HOSPITAL_COMMUNITY): Payer: Self-pay

## 2021-11-06 VITALS — BP 135/83 | HR 67 | Temp 97.8°F | Resp 16

## 2021-11-06 DIAGNOSIS — J441 Chronic obstructive pulmonary disease with (acute) exacerbation: Secondary | ICD-10-CM | POA: Diagnosis not present

## 2021-11-06 MED ORDER — PREDNISONE 10 MG (21) PO TBPK: ORAL_TABLET | ORAL | 0 refills | Status: DC

## 2021-11-06 MED ORDER — AZITHROMYCIN 250 MG PO TABS: ORAL_TABLET | ORAL | 0 refills | Status: DC

## 2021-11-06 NOTE — Discharge Instructions (Addendum)
Take medication as prescribed. ?Increase fluids and get plenty of rest. ?Continue to use your Trelegy, albuterol nebulizer, albuterol inhaler, and Tessalon Perles as previously prescribed for continued symptoms. ?Follow-up with your surgeons office today regarding the medications you have been prescribed to ensure that the steroid will not interfere with your surgery date. ?Follow-up if you have worsening shortness of breath, difficulty breathing, chest tightness, or other concerns. ?

## 2021-11-06 NOTE — ED Triage Notes (Incomplete)
Pt states wheezing flare up since Monday. Pt states SOB. Pt has taken Ibuprofen and Tylenol for the past days. Pt states he used his inhaler this morning.  Pt states dx with Liver Cancer and expected to have surgery May 4th 2023. ?

## 2021-11-06 NOTE — ED Triage Notes (Signed)
Pt states wheezing and SOB since Monday,. Pt states he has taken Ibuprofen and Tylenol for the past days. Pt denies pain. Pt states he used his inhaler this morning. Pt states he has surgery on May 4th 2023, since being DX with Liver cancer.   ?

## 2021-11-06 NOTE — ED Provider Notes (Signed)
[facility]    CSN: [id number] Arrival date & time: 11/06/21  0844      History   Chief Complaint Chief Complaint  Patient presents with   Wheezing    Emphysema flare up - Entered by patient    HPI George Watson is a 76 y.o. male.   Patient is a 76 year old male who presents with a COPD exacerbation.  Patient states symptoms started 2 days ago to include wheezing, shortness of breath, difficulty breathing, chest tightness, and a productive cough.  He states that he was in [place] and after a bicycle ride he developed his symptoms.  He denies fever, chills, chest pain, upper respiratory symptoms, or GI symptoms.  Patient dates that he has an upcoming liver surgery the first part of May and is concerned about his breathing.  States that if his breathing is not under control, they will not perform the surgery.  He normally takes Trelegy, has albuterol nebulizer, and albuterol inhaler for his COPD.  Patient states that he normally gets an antibiotic, and steroids for his symptoms.  The history is provided by the patient.   Past Medical History:  Diagnosis Date   Aortic aneurysm (HCC)    Arthritis    BPH (benign prostatic hypertrophy)    Cataract 2015   Cirrhosis (HCC)    COPD (chronic obstructive pulmonary disease) (HCC)    DDD (degenerative disc disease), lumbosacral    Elevated PSA    Emphysema of lung (HCC)    Essential hypertension, benign    diet controlled   Fracture of shaft of clavicle    GERD (gastroesophageal reflux disease)    H/O drug abuse (HCC)    multisubstance   Hepatitis B    Hepatitis C 10/1996   HSV-2 (herpes simplex virus 2) infection    Hx of biopsy 08/2004   liver   Hx of colonic polyps 2022   Hyperlipidemia    Inguinal hernia    right   Other and unspecified hyperlipidemia    Prostate cancer (HCC)    Substance abuse (HCC)    Tuberculosis     Patient Active Problem List   Diagnosis Date Noted   Hepatocellular carcinoma (HCC)  10/14/2021   Bloating 09/17/2021   COVID-19 08/13/2021   Lung nodule 10/05/2020   Gastroenteritis 05/08/2020   TIA (transient ischemic attack) 07/07/2019   Dilated aortic root (HCC) 06/13/2019   Pure hypertriglyceridemia 04/08/2019   Fear of flying 02/11/2018   Tear of right rotator cuff 08/31/2017   Wellness examination 01/07/2017   Prediabetes 01/06/2017   Atherosclerosis of aorta (HCC) 07/09/2016   Degeneration of lumbar or lumbosacral intervertebral disc 02/15/2014   Essential hypertension, benign    Hyperlipidemia with target LDL less than 70    HSV-2 (herpes simplex virus 2) infection    COPD (chronic obstructive pulmonary disease) (HCC)    Benign prostatic hyperplasia    DDD (degenerative disc disease), lumbosacral    Prostate cancer (HCC)    Cirrhosis (HCC) 05/21/1997   Hepatitis C, chronic (HCC) 10/19/1996    Past Surgical History:  Procedure Laterality Date   BACK SURGERY     CERVICAL SPINE SURGERY     EYE SURGERY  3/20   fracture ribs     3   HERNIA REPAIR     INGUINAL HERNIA REPAIR  07/01/2012   Procedure: HERNIA REPAIR INGUINAL ADULT;  Surgeon: Lodema Pilot, DO;  Location: [facility];  Service: General;  Laterality: Right;  with Mesh  left knee meniscus repair  07/21/1990   right rotator cuff  07/21/1990   right shoulder arthroscopy  07/21/2009   SPINE SURGERY  10/14       Home Medications    Prior to Admission medications   Medication Sig Start Date End Date Taking? Authorizing Provider  azithromycin (ZITHROMAX) 250 MG tablet Take 2 tablets today, then take 1 tablet daily for the next 4 days. 11/06/21  Yes Leath-Warren, Sadie Haber, NP  predniSONE (STERAPRED UNI-PAK 21 TAB) 10 MG (21) TBPK tablet Take 6 tabletss by mouth with breakfast on day 1. Take 5 tablets with breakfast on day 2. Take 4 tablets with breakfast on day 3. Take 3 tablets with breakfast on day 4. Take 2 tablets with breakfast on day 5. Take 1 tablet with breakfast on day 6. 11/06/21  Yes  Leath-Warren, Sadie Haber, NP  albuterol (PROVENTIL) (2.5 MG/3ML) 0.083% nebulizer solution Take 3 mLs (2.5 mg total) by nebulization every 6 (six) hours as needed for wheezing or shortness of breath. 05/29/21 05/29/22  Parrett, Virgel Bouquet, NP  albuterol (VENTOLIN HFA) 108 (90 Base) MCG/ACT inhaler INHALE 2 PUFFS INTO THE LUNGS EVERY 6 HOURS AS NEEDED FOR SHORTNESS OF BREATH 08/04/21   de [place], Buren Kos, MD  aspirin EC 81 MG EC tablet Take 1 tablet (81 mg total) by mouth daily. 07/09/19   Black, Lesle Chris, NP  B Complex Vitamins (B COMPLEX PO) Take 1 tablet by mouth daily.    [provider]  benzonatate (TESSALON) 200 MG capsule Take 1 capsule (200 mg total) by mouth 3 (three) times daily as needed for cough. 05/21/21 05/21/22  Parrett, Virgel Bouquet, NP  cholecalciferol (VITAMIN D3) 25 MCG (1000 UT) tablet Take 1,000 Units by mouth daily.    [provider]  Coenzyme Q10-Vitamin E 100-300 MG-UNIT CHEW Chew 1 tablet by mouth daily.    [provider]  diazepam (VALIUM) 2 MG tablet Take 1-2 tabs 30 minutes before flight. 07/23/21   de [place], Raymond J, MD  diphenoxylate-atropine (LOMOTIL) 2.5-0.025 MG tablet Take 1 tablet by mouth 4 (four) times daily as needed for diarrhea or loose stools. 07/25/21   Armbruster, Willaim Rayas, MD  dutasteride (AVODART) 0.5 MG capsule Take 0.5 mg by mouth daily.    [provider]  famotidine (PEPCID) 20 MG tablet Take 20 mg by mouth every evening.     [provider]  Fluticasone-Umeclidin-Vilant (TRELEGY ELLIPTA) 100-62.5-25 MCG/ACT AEPB TAKE 1 PUFF BY MOUTH EVERY DAY 06/25/21   Leslye Peer, MD  glucosamine-chondroitin 500-400 MG tablet Take 3 tablets by mouth daily. Patient taking differently: Take 2 tablets by mouth daily. 02/15/18   Olive Bass, FNP  hydrochlorothiazide (MICROZIDE) 12.5 MG capsule Take 1 capsule (12.5 mg total) by mouth daily. 11/23/20   Orland Mustard, MD  losartan (COZAAR) 100 MG tablet TAKE 1 TABLET BY MOUTH  EVERY DAY 10/31/21   [place], Buren Kos, MD  metaxalone (SKELAXIN) 800 MG tablet TAKE 1 TABLET (800 MG TOTAL) BY MOUTH 3 (THREE) TIMES DAILY AS NEEDED FOR MUSCLE SPASMS. 09/23/21   [place], Buren Kos, MD  Multiple Vitamins-Minerals (CENTRUM SILVER PO) Take 1 tablet by mouth daily.    [provider]  mupirocin cream (BACTROBAN) 2 % Apply 1 application topically 2 (two) times daily. 04/15/20   Orland Mustard, MD  Omega-3 Fatty Acids (FISH OIL PO) Take 1 tablet by mouth daily.    [provider]  rosuvastatin (CRESTOR) 5 MG tablet TAKE  1 TABLET (5 MG TOTAL) BY MOUTH DAILY. 10/08/21   [place], Buren Kos, MD  tamsulosin (FLOMAX) 0.4 MG CAPS capsule Take 0.4 mg by mouth daily.     [provider]  TURMERIC CURCUMIN PO Take 1 tablet by mouth daily.    [provider]    Family History Family History  Problem Relation Age of Onset   Leukemia Mother    Throat cancer Father    Esophageal cancer Father    Cancer Father        esophageal cancer   Breast cancer Sister    Cancer Sister    Prostate cancer Brother    Melanoma Brother    Colon cancer Neg Hx    Stomach cancer Neg Hx    Pancreatic cancer Neg Hx    Liver disease Neg Hx     Social History Social History   Tobacco Use   Smoking status: Former    Packs/day: 1.50    Years: 43.00    Pack years: 64.50    Types: Cigarettes    Quit date: 02/22/2003    Years since quitting: 18.7   Smokeless tobacco: Never  Vaping Use   Vaping Use: Never used  Substance Use Topics   Alcohol use: Not Currently    Comment: 1988   Drug use: Not Currently    Types: Cocaine, Heroin    Comment: 26 years ago cocaine, heroin. methadone     Allergies   Patient has no known allergies.   Review of Systems Review of Systems  Constitutional:  Positive for fatigue.  Respiratory:  Positive for cough, chest tightness, shortness of breath and wheezing.   Cardiovascular: Negative.   Gastrointestinal: Negative.   Skin:  Negative.   Psychiatric/Behavioral: Negative.      Physical Exam Triage Vital Signs ED Triage Vitals  Enc Vitals Group     BP 11/06/21 0949 135/83     Pulse Rate 11/06/21 0949 67     Resp 11/06/21 0949 16     Temp 11/06/21 0949 97.8 F (36.6 C)     Temp Source 11/06/21 0949 Oral     SpO2 11/06/21 0949 94 %     Weight --      Height --      Head Circumference --      Peak Flow --      Pain Score 11/06/21 0932 0     Pain Loc --      Pain Edu? --      Excl. in GC? --    No data found.  Updated Vital Signs BP 135/83   Pulse 67   Temp 97.8 F (36.6 C) (Oral)   Resp 16   SpO2 94%   Visual Acuity Right Eye Distance:   Left Eye Distance:   Bilateral Distance:    Right Eye Near:   Left Eye Near:    Bilateral Near:     Physical Exam Vitals reviewed.  Constitutional:      General: He is not in acute distress.    Appearance: Normal appearance.     Comments: Speaking in complete sentences  HENT:     Head: Normocephalic.     Right Ear: Tympanic membrane, ear canal and external ear normal.     Left Ear: Tympanic membrane, ear canal and external ear normal.     Nose: Nose normal.     Mouth/Throat:     Mouth: Mucous membranes are moist.  Eyes:  Extraocular Movements: Extraocular movements intact.     Conjunctiva/sclera: Conjunctivae normal.     Pupils: Pupils are equal, round, and reactive to light.  Cardiovascular:     Rate and Rhythm: Normal rate and regular rhythm.     Pulses: Normal pulses.     Heart sounds: Normal heart sounds.  Pulmonary:     Effort: Pulmonary effort is normal.     Breath sounds: Examination of the right-upper field reveals wheezing. Examination of the left-upper field reveals wheezing. Examination of the right-lower field reveals wheezing and rhonchi. Examination of the left-lower field reveals wheezing. Wheezing and rhonchi present. No decreased breath sounds.     Comments: Rhonchi in right lower lobe that improves with cough Chest:      Chest wall: No tenderness.  Abdominal:     General: Bowel sounds are normal.     Palpations: Abdomen is soft.     Tenderness: There is no abdominal tenderness.  Musculoskeletal:     Cervical back: Normal range of motion.  Skin:    General: Skin is warm.  Neurological:     General: No focal deficit present.     Mental Status: He is alert and oriented to person, place, and time.  Psychiatric:        Mood and Affect: Mood normal.        Behavior: Behavior normal.     UC Treatments / Results  Labs (all labs ordered are listed, but only abnormal results are displayed) Labs Reviewed - No data to display  EKG   Radiology No results found.  Procedures Procedures (including critical care time)  Medications Ordered in UC Medications - No data to display  Initial Impression / Assessment and Plan / UC Course  I have reviewed the triage vital signs and the nursing notes.  Pertinent labs & imaging results that were available during my care of the patient were reviewed by me and considered in my medical decision making (see chart for details).  The patient is a 76 year old male who presents for COPD exacerbation.  Symptoms have been present for the past 2 days.  Patient presents with chest tightness, wheezing, and shortness of breath.  On exam, he is in no acute distress, he is speaking in complete sentences.  He does have expiratory wheezing and rhonchi that clears with coughing.  There is no concern for a bacterial upper respiratory infection, but will treat patient prophylactically with a Z-Pak.  We will also prescribe a prednisone taper for his symptoms.  Patient was advised to continue using his Trelegy, albuterol nebulizer, and albuterol inhaler as needed for his symptoms.  Patient advised to follow-up with his surgeon regarding use of steroids prior to his upcoming liver surgery.  Follow-up as needed. Final Clinical Impressions(s) / UC Diagnoses   Final diagnoses:  COPD  exacerbation [facility])     Discharge Instructions      Take medication as prescribed. Increase fluids and get plenty of rest. Continue to use your Trelegy, albuterol nebulizer, albuterol inhaler, and Tessalon Perles as previously prescribed for continued symptoms. Follow-up with your surgeons office today regarding the medications you have been prescribed to ensure that the steroid will not interfere with your surgery date. Follow-up if you have worsening shortness of breath, difficulty breathing, chest tightness, or other concerns.     ED Prescriptions     Medication Sig Dispense Auth. Provider   predniSONE (STERAPRED UNI-PAK 21 TAB) 10 MG (21) TBPK tablet Take 6 tabletss by mouth with  breakfast on day 1. Take 5 tablets with breakfast on day 2. Take 4 tablets with breakfast on day 3. Take 3 tablets with breakfast on day 4. Take 2 tablets with breakfast on day 5. Take 1 tablet with breakfast on day 6. 21 tablet Leath-Warren, Sadie Haber, NP   azithromycin (ZITHROMAX) 250 MG tablet Take 2 tablets today, then take 1 tablet daily for the next 4 days. 6 tablet Leath-Warren, Sadie Haber, NP      PDMP not reviewed this encounter.   Abran Cantor, NP 11/06/21 1058

## 2021-11-07 ENCOUNTER — Other Ambulatory Visit (HOSPITAL_BASED_OUTPATIENT_CLINIC_OR_DEPARTMENT_OTHER): Payer: Self-pay | Admitting: Family Medicine

## 2021-11-07 DIAGNOSIS — J418 Mixed simple and mucopurulent chronic bronchitis: Secondary | ICD-10-CM

## 2021-11-08 ENCOUNTER — Other Ambulatory Visit: Payer: Self-pay | Admitting: Family Medicine

## 2021-11-12 ENCOUNTER — Other Ambulatory Visit: Payer: Self-pay | Admitting: Adult Health

## 2021-11-13 NOTE — Telephone Encounter (Signed)
Tammy, please advise on refill request. ?

## 2021-11-13 NOTE — Pre-Procedure Instructions (Signed)
Surgical Instructions ? ? ? Your procedure is scheduled on Thursday 11/21/21. ? ? Report to [facility] Main Entrance "A" at 07:30 A.M., then check in with the Admitting office. ? Call this number if you have problems the morning of surgery: ? [phone] ? ? If you have any questions prior to your surgery date call [phone]: Open Monday-Friday 8am-4pm ? ? ? Remember: ? Do not eat after midnight the night before your surgery ? ?You may drink clear liquids until 04:30 A.M. the morning of your surgery.   ?Clear liquids allowed are: Water, Non-Citrus Juices (without pulp), Carbonated Beverages, Clear Tea, Black Coffee ONLY (NO MILK, CREAM OR POWDERED CREAMER of any kind), and Gatorade ? ?Patient Instructions ? ?Drink one bottle of Ensure with dinner the night before your surgery. Drink another bottle of Ensure before going to bed.  ? ?The night before surgery:  ?No food after midnight. ONLY clear liquids after midnight ? ?The day of surgery (if you do NOT have diabetes):  ?Drink ONE (1) Pre-Surgery Clear Ensure by 04:30 A.M. the morning of surgery. Drink in one sitting. Do not sip.  ?This drink was given to you during your hospital  ?pre-op appointment visit. ? ?Nothing else to drink after completing the  ?Pre-Surgery Clear Ensure. ? ?       If you have questions, please contact your surgeon?s office. ? ?  ? Take these medicines the morning of surgery with A SIP OF WATER:  ? dutasteride (AVODART) ? Fluticasone-Umeclidin-Vilant (TRELEGY ELLIPTA)  ? rosuvastatin (CRESTOR) ? tamsulosin [facility])  ? ? ? Take these medicines if needed:  ? albuterol (PROVENTIL) (2.5 MG/3ML)  ? albuterol (VENTOLIN HFA) 108 (90 Base)  ? metaxalone [facility]) ? ? ?As of today, STOP taking any Aspirin (unless otherwise instructed by your surgeon) Aleve, Naproxen, Ibuprofen, Motrin, Advil, Goody's, BC's, all herbal medications, fish oil, and all vitamins. ? ?         ?Do not wear jewelry or makeup ?Do not wear lotions, powders,  perfumes/colognes, or deodorant. ?Do not shave 48 hours prior to surgery.  Men may shave face and neck. ?Do not bring valuables to the hospital. ?Do not wear nail polish, gel polish, artificial nails, or any other type of covering on natural nails (fingers and toes) ?If you have artificial nails or gel coating that need to be removed by a nail salon, please have this removed prior to surgery. Artificial nails or gel coating may interfere with anesthesia's ability to adequately monitor your vital signs. ? ?[facility] is not responsible for any belongings or valuables. .  ? ?Do NOT Smoke (Tobacco/Vaping)  24 hours prior to your procedure ? ?If you use a CPAP at night, you may bring your mask for your overnight stay. ?  ?Contacts, glasses, hearing aids, dentures or partials may not be worn into surgery, please bring cases for these belongings ?  ?For patients admitted to the hospital, discharge time will be determined by your treatment team. ?  ?Patients discharged the day of surgery will not be allowed to drive home, and someone needs to stay with them for 24 hours. ? ? ?SURGICAL WAITING ROOM VISITATION ?Patients having surgery or a procedure in a hospital may have two support people. ?Children under the age of 31 must have an adult with them who is not the patient. ?They may stay in the waiting area during the procedure and may switch out with other visitors. If the patient needs to stay at the hospital  during part of their recovery, the visitor guidelines for inpatient rooms apply. ? ?Please refer to the [facility] website for the visitor guidelines for Inpatients (after your surgery is over and you are in a regular room).  ? ? ? ? ? ?Special instructions:   ? ?Oral Hygiene is also important to reduce your risk of infection.  Remember - BRUSH YOUR TEETH THE MORNING OF SURGERY WITH YOUR REGULAR TOOTHPASTE ? ? ?[facility]- Preparing For Surgery ? ?Before surgery, you can play an important role. Because skin is not  sterile, your skin needs to be as free of germs as possible. You can reduce the number of germs on your skin by washing with CHG (chlorahexidine gluconate) Soap before surgery.  CHG is an antiseptic cleaner which kills germs and bonds with the skin to continue killing germs even after washing.   ? ? ?Please do not use if you have an allergy to CHG or antibacterial soaps. If your skin becomes reddened/irritated stop using the CHG.  ?Do not shave (including legs and underarms) for at least 48 hours prior to first CHG shower. It is OK to shave your face. ? ?Please follow these instructions carefully. ?  ? ? Shower the NIGHT BEFORE SURGERY and the MORNING OF SURGERY with CHG Soap.  ? If you chose to wash your hair, wash your hair first as usual with your normal shampoo. After you shampoo, rinse your hair and body thoroughly to remove the shampoo.  Then [employer] and genitals (private parts) with your normal soap and rinse thoroughly to remove soap. ? ?After that Use CHG Soap as you would any other liquid soap. You can apply CHG directly to the skin and wash gently with a scrungie or a clean washcloth.  ? ?Apply the CHG Soap to your body ONLY FROM THE NECK DOWN.  Do not use on open wounds or open sores. Avoid contact with your eyes, ears, mouth and genitals (private parts). Wash Face and genitals (private parts)  with your normal soap.  ? ?Wash thoroughly, paying special attention to the area where your surgery will be performed. ? ?Thoroughly rinse your body with warm water from the neck down. ? ?DO NOT shower/wash with your normal soap after using and rinsing off the CHG Soap. ? ?Pat yourself dry with a CLEAN TOWEL. ? ?Wear CLEAN PAJAMAS to bed the night before surgery ? ?Place CLEAN SHEETS on your bed the night before your surgery ? ?DO NOT SLEEP WITH PETS. ? ? ?Day of Surgery: ? ?Take a shower with CHG soap. ?Wear Clean/Comfortable clothing the morning of surgery ?Do not apply any deodorants/lotions.   ?Remember to  brush your teeth WITH YOUR REGULAR TOOTHPASTE. ? ? ? ?If you received a COVID test during your pre-op visit, it is requested that you wear a mask when out in public, stay away from anyone that may not be feeling well, and notify your surgeon if you develop symptoms. If you have been in contact with anyone that has tested positive in the last 10 days, please notify your surgeon. ? ?  ?Please read over the following fact sheets that you were given.  ? ?

## 2021-11-14 ENCOUNTER — Encounter (HOSPITAL_COMMUNITY)
Admission: RE | Admit: 2021-11-14 | Discharge: 2021-11-14 | Disposition: A | Discharge status: Home / Self Care | Payer: PPO | Source: Ambulatory Visit | Attending: Surgery | Admitting: Surgery

## 2021-11-14 ENCOUNTER — Encounter (HOSPITAL_COMMUNITY): Payer: Self-pay

## 2021-11-14 ENCOUNTER — Other Ambulatory Visit: Payer: Self-pay

## 2021-11-14 VITALS — BP 135/74 | HR 77 | Temp 97.7°F | Resp 17 | Ht 71.0 in | Wt 174.4 lb

## 2021-11-14 DIAGNOSIS — I7121 Aneurysm of the ascending aorta, without rupture: Secondary | ICD-10-CM | POA: Diagnosis not present

## 2021-11-14 DIAGNOSIS — Z01812 Encounter for preprocedural laboratory examination: Secondary | ICD-10-CM | POA: Diagnosis not present

## 2021-11-14 DIAGNOSIS — B1920 Unspecified viral hepatitis C without hepatic coma: Secondary | ICD-10-CM | POA: Diagnosis not present

## 2021-11-14 DIAGNOSIS — J449 Chronic obstructive pulmonary disease, unspecified: Secondary | ICD-10-CM | POA: Diagnosis not present

## 2021-11-14 DIAGNOSIS — K7460 Unspecified cirrhosis of liver: Secondary | ICD-10-CM | POA: Insufficient documentation

## 2021-11-14 DIAGNOSIS — Z8611 Personal history of tuberculosis: Secondary | ICD-10-CM | POA: Diagnosis not present

## 2021-11-14 DIAGNOSIS — R160 Hepatomegaly, not elsewhere classified: Secondary | ICD-10-CM | POA: Diagnosis not present

## 2021-11-14 DIAGNOSIS — Z01818 Encounter for other preprocedural examination: Secondary | ICD-10-CM

## 2021-11-14 DIAGNOSIS — F1910 Other psychoactive substance abuse, uncomplicated: Secondary | ICD-10-CM | POA: Insufficient documentation

## 2021-11-14 HISTORY — DX: Dyspnea, unspecified: R06.00

## 2021-11-14 LAB — BASIC METABOLIC PANEL
Anion gap: 7 (ref 5–15)
BUN: 22 mg/dL (ref 8–23)
CO2: 28 mmol/L (ref 22–32)
Calcium: 10.1 mg/dL (ref 8.9–10.3)
Chloride: 103 mmol/L (ref 98–111)
Creatinine, Ser: 1.05 mg/dL (ref 0.61–1.24)
GFR, Estimated: >60 mL/min (ref >60)
Glucose, Bld: 105 mg/dL — ABNORMAL HIGH (ref 70–99)
Potassium: 4.3 mmol/L (ref 3.5–5.1)
Sodium: 138 mmol/L (ref 135–145)

## 2021-11-14 NOTE — Progress Notes (Signed)
PCP:  Debbra Riding, MD ?Cardiologist: Arvid Right, MD ? ?EKG:  05/11/21 ?CXR:  04/23/21 ?ECHO: 07/07/19 ?Stress Test:  08/13/05 ?Cardiac Cath:  denies ? ?Fasting Blood Sugar-  na ?Checks Blood Sugar_na__ times a day ? ?ASA: patient stated that Dr. Zenia Resides said continue ASA. ?Blood Thinner: No ? ?OSA/CPAP: No ? ?Covid test not needed ? ?Anesthesia Review: Yes, cardiac history ? ?Patient denies shortness of breath, fever, cough, and chest pain at PAT appointment. ? ?Patient verbalized understanding of instructions provided today at the PAT appointment.  Patient asked to review instructions at home and day of surgery.   ?

## 2021-11-14 NOTE — Pre-Procedure Instructions (Signed)
Surgical Instructions ? ? ? Your procedure is scheduled on Thursday 11/21/21. ? ? Report to [facility] Main Entrance "A" at 07:30 A.M., then check in with the Admitting office. ? Call this number if you have problems the morning of surgery: ? [phone] ? ? If you have any questions prior to your surgery date call [phone]: Open Monday-Friday 8am-4pm ? ? ? Remember: ? Do not eat after midnight the night before your surgery ? ?You may drink clear liquids until 04:30 A.M. the morning of your surgery.   ?Clear liquids allowed are: Water, Non-Citrus Juices (without pulp), Carbonated Beverages, Clear Tea, Black Coffee ONLY (NO MILK, CREAM OR POWDERED CREAMER of any kind), and Gatorade ? ?Patient Instructions ? ?Drink two bottle of Ensure with dinner the night before your surgery. Drink another bottle of Ensure before going to bed.  ? ?The night before surgery:  ?No food after midnight. ONLY clear liquids after midnight ? ?The day of surgery (if you do NOT have diabetes):  ?Drink ONE (1) Pre-Surgery Clear Ensure by 04:30 A.M. the morning of surgery. Drink in one sitting. Do not sip.  ?This drink was given to you during your hospital  ?pre-op appointment visit. ? ?Nothing else to drink after completing the  ?Pre-Surgery Clear Ensure. ? ?       If you have questions, please contact your surgeon?s office. ? ?  ? Take these medicines the morning of surgery with A SIP OF WATER:  ? dutasteride (AVODART) ? Fluticasone-Umeclidin-Vilant (TRELEGY ELLIPTA)  ? rosuvastatin (CRESTOR) ? tamsulosin [facility])  ? ? ? Take these medicines if needed:  ? albuterol (PROVENTIL) (2.5 MG/3ML)  ? albuterol (VENTOLIN HFA) 108 (90 Base)  ? metaxalone [facility]) ? ? ?As of today, STOP taking any Aspirin (unless otherwise instructed by your surgeon) Aleve, Naproxen, Ibuprofen, Motrin, Advil, Goody's, BC's, all herbal medications, fish oil, and all vitamins. ? ?         ?Do not wear jewelry or makeup ?Do not wear lotions, powders, colognes, or  deodorant. ?Do not shave 48 hours prior to surgery.  Men may shave face and neck. ?Do not bring valuables to the hospital. ? ? ?[facility] is not responsible for any belongings or valuables. .  ? ?Do NOT Smoke (Tobacco/Vaping)  24 hours prior to your procedure ? ?If you use a CPAP at night, you may bring your mask for your overnight stay. ?  ?Contacts, glasses, hearing aids, dentures or partials may not be worn into surgery, please bring cases for these belongings ?  ?For patients admitted to the hospital, discharge time will be determined by your treatment team. ?  ?Patients discharged the day of surgery will not be allowed to drive home, and someone needs to stay with them for 24 hours. ? ? ?SURGICAL WAITING ROOM VISITATION ?Patients having surgery or a procedure in a hospital may have two support people. ?Children under the age of 10 must have an adult with them who is not the patient. ?They may stay in the waiting area during the procedure and may switch out with other visitors. If the patient needs to stay at the hospital during part of their recovery, the visitor guidelines for inpatient rooms apply. ? ?Please refer to the [place] website for the visitor guidelines for Inpatients (after your surgery is over and you are in a regular room).  ? ? ? ? ? ?Special instructions:   ? ?Oral Hygiene is also important to reduce your risk of infection.  Remember - BRUSH YOUR TEETH THE MORNING OF SURGERY WITH YOUR REGULAR TOOTHPASTE ? ? ?[place]- Preparing For Surgery ? ?Before surgery, you can play an important role. Because skin is not sterile, your skin needs to be as free of germs as possible. You can reduce the number of germs on your skin by washing with CHG (chlorahexidine gluconate) Soap before surgery.  CHG is an antiseptic cleaner which kills germs and bonds with the skin to continue killing germs even after washing.   ? ? ?Please do not use if you have an allergy to CHG or antibacterial soaps. If your  skin becomes reddened/irritated stop using the CHG.  ?Do not shave (including legs and underarms) for at least 48 hours prior to first CHG shower. It is OK to shave your face. ? ?Please follow these instructions carefully. ?  ? ? Shower the NIGHT BEFORE SURGERY and the MORNING OF SURGERY with CHG Soap.  ? If you chose to wash your hair, wash your hair first as usual with your normal shampoo. After you shampoo, rinse your hair and body thoroughly to remove the shampoo.  Then [employer] and genitals (private parts) with your normal soap and rinse thoroughly to remove soap. ? ?After that Use CHG Soap as you would any other liquid soap. You can apply CHG directly to the skin and wash gently with a scrungie or a clean washcloth.  ? ?Apply the CHG Soap to your body ONLY FROM THE NECK DOWN.  Do not use on open wounds or open sores. Avoid contact with your eyes, ears, mouth and genitals (private parts). Wash Face and genitals (private parts)  with your normal soap.  ? ?Wash thoroughly, paying special attention to the area where your surgery will be performed. ? ?Thoroughly rinse your body with warm water from the neck down. ? ?DO NOT shower/wash with your normal soap after using and rinsing off the CHG Soap. ? ?Pat yourself dry with a CLEAN TOWEL. ? ?Wear CLEAN PAJAMAS to bed the night before surgery ? ?Place CLEAN SHEETS on your bed the night before your surgery ? ?DO NOT SLEEP WITH PETS. ? ? ?Day of Surgery: ? ?Take a shower with CHG soap. ?Wear Clean/Comfortable clothing the morning of surgery ?Do not apply any deodorants/lotions.   ?Remember to brush your teeth WITH YOUR REGULAR TOOTHPASTE. ? ? ? ?If you received a COVID test during your pre-op visit, it is requested that you wear a mask when out in public, stay away from anyone that may not be feeling well, and notify your surgeon if you develop symptoms. If you have been in contact with anyone that has tested positive in the last 10 days, please notify your  surgeon. ? ?  ?Please read over the following fact sheets that you were given.  ? ?

## 2021-11-15 DIAGNOSIS — M9902 Segmental and somatic dysfunction of thoracic region: Secondary | ICD-10-CM | POA: Diagnosis not present

## 2021-11-15 DIAGNOSIS — M47812 Spondylosis without myelopathy or radiculopathy, cervical region: Secondary | ICD-10-CM | POA: Diagnosis not present

## 2021-11-15 DIAGNOSIS — M47817 Spondylosis without myelopathy or radiculopathy, lumbosacral region: Secondary | ICD-10-CM | POA: Diagnosis not present

## 2021-11-15 DIAGNOSIS — M9901 Segmental and somatic dysfunction of cervical region: Secondary | ICD-10-CM | POA: Diagnosis not present

## 2021-11-15 DIAGNOSIS — S39012A Strain of muscle, fascia and tendon of lower back, initial encounter: Secondary | ICD-10-CM | POA: Diagnosis not present

## 2021-11-15 DIAGNOSIS — M9903 Segmental and somatic dysfunction of lumbar region: Secondary | ICD-10-CM | POA: Diagnosis not present

## 2021-11-15 NOTE — Progress Notes (Signed)
Anesthesia Chart Review: ? ?Follows with cardiothoracic surgery for monitoring of 4.3 cm thoracic ascending aortic aneurysm.  Last seen 08/12/2021 and aneurysm was noted to be stable.  Recommended 1 year follow-up. ? ?Follows with gastroenterology for history of hepatitis C, mild cirrhosis.  Remote polysubstance abuse with IV drug use.  Per GI notes, was treated at [facility] in 2014 for hep C which was eradicated. Abdomen US on 10/01/21 showed left liver mass, new from prior in 06/2021. Liver MRI on 10/04/21 confirmed 2.6 cm left hepatic lobe mass, imaging characteristics are consistent with hepatocellular carcinoma. ? ?Follows with pulmonology for history of moderate COPD with frequent exacerbations.  Per notes, also has history of TB, took meds for 1 year in 1990.  Maintained on Trelegy and as needed albuterol.  He was seen in urgent care on 11/06/2021 with mild COPD exacerbation.  Per note, "On exam, he is in no acute distress, he is speaking in complete sentences.  He does have expiratory wheezing and rhonchi that clears with coughing.  There is no concern for a bacterial upper respiratory infection, but will treat patient prophylactically with a Z-Pak.  We will also prescribe a prednisone taper for his symptoms.  Patient was advised to continue using his Trelegy, albuterol nebulizer, and albuterol inhaler as needed for his symptoms.  Patient advised to follow-up with his surgeon regarding use of steroids prior to his upcoming liver surgery." ? ?Patient reported no cough or shortness of breath at PAT appointment. ? ?Preop labs reviewed, unremarkable. ? ?EKG 05/11/2021: NSR.  Rate 80. ? ?TTE 07/07/2019: ? 1. Left ventricular ejection fraction, by visual estimation, is 55 to  ?60%. The left ventricle has normal function. There is mildly increased  ?left ventricular hypertrophy.  ? 2. Left ventricular diastolic parameters are consistent with Grade I  ?diastolic dysfunction (impaired relaxation).  ? 3. The left ventricle has  no regional wall motion abnormalities.  ? 4. Global right ventricle has normal systolic function.The right  ?ventricular size is normal. No increase in right ventricular wall  ?thickness.  ? 5. Left atrial size was normal.  ? 6. Right atrial size was normal.  ? 7. The mitral valve is abnormal. Trivial mitral valve regurgitation.  ? 8. The tricuspid valve is grossly normal. Tricuspid valve regurgitation  ?is trivial.  ? 9. The aortic valve is tricuspid. Aortic valve regurgitation is not  ?visualized.  ?10. The pulmonic valve was grossly normal. Pulmonic valve regurgitation is  ?trivial.  ?11. The inferior vena cava is normal in size with greater than 50%  ?respiratory variability, suggesting right atrial pressure of 3 mmHg.  ? ? ? ?Karoline Caldwell, PA-C ?[facility] Stay [facility]/Anesthesiology ?Phone [phone] ?11/15/2021 1:22 PM ? ?

## 2021-11-15 NOTE — Anesthesia Preprocedure Evaluation (Addendum)
Anesthesia Evaluation  ?Patient identified by MRN, date of birth, ID band ?Patient awake ? ? ? ?Reviewed: ?Allergy & Precautions, NPO status , Patient's Chart, lab work & pertinent test results ? ?Airway ?Mallampati: II ? ?TM Distance: >3 FB ?Neck ROM: Full ? ? ? Dental ?no notable dental hx. ? ?  ?Pulmonary ?COPD, former smoker,  ?  ?Pulmonary exam normal ?breath sounds clear to auscultation ? ? ? ? ? ? Cardiovascular ?hypertension, negative cardio ROS ?Normal cardiovascular exam ?Rhythm:Regular Rate:Normal ? ? ?  ?Neuro/Psych ?Anxiety TIA  ? GI/Hepatic ?GERD  ,(+) Cirrhosis  ?  ?  ? , Hepatitis - (chronic), C, B  ?Endo/Other  ?prediabetes ? Renal/GU ?negative Renal ROS  ?negative genitourinary ?  ?Musculoskeletal ? ?(+) Arthritis , Osteoarthritis,   ? Abdominal ?  ?Peds ?negative pediatric ROS ?(+)  Hematology ?negative hematology ROS ?(+)   ?Anesthesia Other Findings ? ? Reproductive/Obstetrics ?negative OB ROS ? ?  ? ? ? ? ? ? ? ? ? ? ? ? ? ?  ?  ? ? ? ? ? ? ? ?Anesthesia Physical ?Anesthesia Plan ? ?ASA: 3 ? ?Anesthesia Plan: General  ? ?Post-op Pain Management:   ? ?Induction: Intravenous ? ?PONV Risk Score and Plan: 2 and Treatment may vary due to age or medical condition, Midazolam, Ondansetron and Dexamethasone ? ?Airway Management Planned: Oral ETT ? ?Additional Equipment:  ? ?Intra-op Plan:  ? ?Post-operative Plan: Extubation in OR ? ?Informed Consent: I have reviewed the patients History and Physical, chart, labs and discussed the procedure including the risks, benefits and alternatives for the proposed anesthesia with the patient or authorized representative who has indicated his/her understanding and acceptance.  ? ? ? ?Dental advisory given ? ?Plan Discussed with: CRNA ? ?Anesthesia Plan Comments: (GETA. 2 PIV.  ? ? ? ?PAT note by Karoline Caldwell, PA-C: ?Follows with cardiothoracic surgery for monitoring of 4.3 cm thoracic ascending aortic aneurysm.  Last seen 08/12/2021  and aneurysm was noted to be stable.  Recommended 1 year follow-up. ? ?Follows with gastroenterology for history of hepatitis C, mild cirrhosis.  Remote polysubstance abuse with IV drug use.  Per GI notes, was treated at [facility] in 2014 for hep C which was eradicated. Abdomen US on 10/01/21 showed left liver mass, new from prior in 06/2021. Liver MRI on 10/04/21 confirmed 2.6 cm left hepatic lobe mass,?imaging characteristics are consistent with hepatocellular carcinoma. ? ?Follows with pulmonology for history of moderate COPD with frequent exacerbations.  Per notes, also has history of TB, took meds for 1 year in 1990.  Maintained on Trelegy and as needed albuterol.  He was seen in urgent care on 11/06/2021 with mild COPD exacerbation.  Per note, "On exam, he is in no acute distress, he is speaking in complete sentences. ?He does have expiratory wheezing and rhonchi that clears with coughing. ?There is no concern for a bacterial upper respiratory infection, but will treat patient prophylactically with a Z-Pak. ?We will also prescribe a prednisone taper for his symptoms. ?Patient was advised to continue using his Trelegy, albuterol nebulizer, and albuterol inhaler as needed for his symptoms. ?Patient advised to follow-up with his surgeon regarding use of steroids prior to his upcoming liver surgery." ? ?Patient reported no cough or shortness of breath at PAT appointment. ? ?Preop labs reviewed, unremarkable. ? ?EKG 05/11/2021: NSR.  Rate 80. ? ?TTE 07/07/2019: ??1. Left ventricular ejection fraction, by visual estimation, is 55 to  ?60%. The left ventricle has normal function. There is  mildly increased  ?left ventricular hypertrophy.  ??2. Left ventricular diastolic parameters are consistent with Grade I  ?diastolic dysfunction (impaired relaxation).  ??3. The left ventricle has no regional wall motion abnormalities.  ??4. Global right ventricle has normal systolic function.The right  ?ventricular size is normal. No  increase in right ventricular wall  ?thickness.  ??5. Left atrial size was normal.  ??6. Right atrial size was normal.  ??7. The mitral valve is abnormal. Trivial mitral valve regurgitation.  ??8. The tricuspid valve is grossly normal. Tricuspid valve regurgitation  ?is trivial.  ??9. The aortic valve is tricuspid. Aortic valve regurgitation is not  ?visualized.  ?10. The pulmonic valve was grossly normal. Pulmonic valve regurgitation is  ?trivial.  ?11. The inferior vena cava is normal in size with greater than 50%  ?respiratory variability, suggesting right atrial pressure of 3 mmHg.  ? ?)  ? ? ? ? ? ?Anesthesia Quick Evaluation ? ?

## 2021-11-21 ENCOUNTER — Encounter (HOSPITAL_COMMUNITY): Payer: Self-pay | Admitting: Surgery

## 2021-11-21 ENCOUNTER — Inpatient Hospital Stay (HOSPITAL_COMMUNITY): Payer: PPO | Admitting: Physician Assistant

## 2021-11-21 ENCOUNTER — Encounter (HOSPITAL_COMMUNITY)
Admission: RE | Disposition: A | Discharge status: Home / Self Care | Payer: Self-pay | Source: Home / Self Care | Attending: Surgery

## 2021-11-21 ENCOUNTER — Other Ambulatory Visit: Payer: Self-pay

## 2021-11-21 ENCOUNTER — Inpatient Hospital Stay (HOSPITAL_COMMUNITY)
Admission: RE | Admit: 2021-11-21 | Discharge: 2021-11-22 | DRG: 407 | Disposition: A | Discharge status: Home / Self Care | Payer: PPO | Attending: Surgery | Admitting: Surgery

## 2021-11-21 ENCOUNTER — Ambulatory Visit: Payer: PPO

## 2021-11-21 DIAGNOSIS — F419 Anxiety disorder, unspecified: Secondary | ICD-10-CM | POA: Diagnosis not present

## 2021-11-21 DIAGNOSIS — C220 Liver cell carcinoma: Secondary | ICD-10-CM

## 2021-11-21 DIAGNOSIS — I10 Essential (primary) hypertension: Secondary | ICD-10-CM | POA: Diagnosis not present

## 2021-11-21 DIAGNOSIS — J439 Emphysema, unspecified: Secondary | ICD-10-CM | POA: Diagnosis not present

## 2021-11-21 DIAGNOSIS — Z87891 Personal history of nicotine dependence: Secondary | ICD-10-CM

## 2021-11-21 DIAGNOSIS — C61 Malignant neoplasm of prostate: Secondary | ICD-10-CM | POA: Diagnosis not present

## 2021-11-21 DIAGNOSIS — Z86718 Personal history of other venous thrombosis and embolism: Secondary | ICD-10-CM | POA: Diagnosis not present

## 2021-11-21 DIAGNOSIS — E785 Hyperlipidemia, unspecified: Secondary | ICD-10-CM | POA: Diagnosis present

## 2021-11-21 DIAGNOSIS — K219 Gastro-esophageal reflux disease without esophagitis: Secondary | ICD-10-CM | POA: Diagnosis present

## 2021-11-21 DIAGNOSIS — Z8673 Personal history of transient ischemic attack (TIA), and cerebral infarction without residual deficits: Secondary | ICD-10-CM

## 2021-11-21 DIAGNOSIS — J449 Chronic obstructive pulmonary disease, unspecified: Secondary | ICD-10-CM | POA: Diagnosis not present

## 2021-11-21 DIAGNOSIS — R160 Hepatomegaly, not elsewhere classified: Secondary | ICD-10-CM

## 2021-11-21 DIAGNOSIS — K760 Fatty (change of) liver, not elsewhere classified: Secondary | ICD-10-CM | POA: Diagnosis not present

## 2021-11-21 DIAGNOSIS — Z8619 Personal history of other infectious and parasitic diseases: Secondary | ICD-10-CM

## 2021-11-21 DIAGNOSIS — M1990 Unspecified osteoarthritis, unspecified site: Secondary | ICD-10-CM | POA: Diagnosis not present

## 2021-11-21 DIAGNOSIS — N400 Enlarged prostate without lower urinary tract symptoms: Secondary | ICD-10-CM | POA: Diagnosis not present

## 2021-11-21 DIAGNOSIS — Z7982 Long term (current) use of aspirin: Secondary | ICD-10-CM

## 2021-11-21 DIAGNOSIS — Z7951 Long term (current) use of inhaled steroids: Secondary | ICD-10-CM | POA: Diagnosis not present

## 2021-11-21 DIAGNOSIS — Z79899 Other long term (current) drug therapy: Secondary | ICD-10-CM | POA: Diagnosis not present

## 2021-11-21 HISTORY — PX: LAPAROSCOPIC LIVER ULTRASOUND: SHX5902

## 2021-11-21 HISTORY — PX: LAPAROSCOPIC PARTIAL HEPATECTOMY: SHX5909

## 2021-11-21 HISTORY — PX: LAPAROSCOPY: SHX197

## 2021-11-21 LAB — COMPREHENSIVE METABOLIC PANEL
ALT: 74 U/L — ABNORMAL HIGH (ref 0–44)
AST: 73 U/L — ABNORMAL HIGH (ref 15–41)
Albumin: 3.5 g/dL (ref 3.5–5.0)
Alkaline Phosphatase: 43 U/L (ref 38–126)
Anion gap: 9 (ref 5–15)
BUN: 16 mg/dL (ref 8–23)
CO2: 24 mmol/L (ref 22–32)
Calcium: 8.7 mg/dL — ABNORMAL LOW (ref 8.9–10.3)
Chloride: 105 mmol/L (ref 98–111)
Creatinine, Ser: 0.92 mg/dL (ref 0.61–1.24)
GFR, Estimated: >60 mL/min (ref >60)
Glucose, Bld: 216 mg/dL — ABNORMAL HIGH (ref 70–99)
Potassium: 4 mmol/L (ref 3.5–5.1)
Sodium: 138 mmol/L (ref 135–145)
Total Bilirubin: 0.6 mg/dL (ref 0.3–1.2)
Total Protein: 6.3 g/dL — ABNORMAL LOW (ref 6.5–8.1)

## 2021-11-21 LAB — CBC
HCT: 41.5 % (ref 39.0–52.0)
Hemoglobin: 13.9 g/dL (ref 13.0–17.0)
MCH: 31.2 pg (ref 26.0–34.0)
MCHC: 33.5 g/dL (ref 30.0–36.0)
MCV: 93.3 fL (ref 80.0–100.0)
Platelets: 199 10*3/uL (ref 150–400)
RBC: 4.45 MIL/uL (ref 4.22–5.81)
RDW: 12.8 % (ref 11.5–15.5)
WBC: 12.3 10*3/uL — ABNORMAL HIGH (ref 4.0–10.5)
nRBC: 0 % (ref 0.0–0.2)

## 2021-11-21 LAB — ABO/RH: ABO/RH(D): O POS

## 2021-11-21 LAB — PREPARE RBC (CROSSMATCH)

## 2021-11-21 SURGERY — HEPATECTOMY, PARTIAL, LAPAROSCOPIC
Anesthesia: General | Site: Abdomen

## 2021-11-21 MED ORDER — PROPOFOL 10 MG/ML IV BOLUS
INTRAVENOUS | Status: DC | PRN
  Administered 2021-11-21: 150 mg via INTRAVENOUS

## 2021-11-21 MED ORDER — DEXAMETHASONE SODIUM PHOSPHATE 10 MG/ML IJ SOLN
INTRAMUSCULAR | Status: DC | PRN
  Administered 2021-11-21: 10 mg via INTRAVENOUS

## 2021-11-21 MED ORDER — OXYCODONE HCL 5 MG PO TABS
5.0000 mg | ORAL_TABLET | ORAL | Status: DC | PRN
  Administered 2021-11-21 – 2021-11-22 (×2): 10 mg via ORAL
  Filled 2021-11-21 (×2): qty 2

## 2021-11-21 MED ORDER — BUPIVACAINE HCL 0.25 % IJ SOLN
INTRAMUSCULAR | Status: DC | PRN
Start: 2021-11-21 — End: 2021-11-21
  Administered 2021-11-21: 30 mL

## 2021-11-21 MED ORDER — SUGAMMADEX SODIUM 200 MG/2ML IV SOLN
INTRAVENOUS | Status: DC | PRN
  Administered 2021-11-21: 200 mg via INTRAVENOUS

## 2021-11-21 MED ORDER — ONDANSETRON HCL 4 MG/2ML IJ SOLN: 4.0000 mg | Freq: Once | INTRAMUSCULAR | Status: DC | PRN

## 2021-11-21 MED ORDER — GABAPENTIN 300 MG PO CAPS
300.0000 mg | ORAL_CAPSULE | ORAL | Status: AC
  Administered 2021-11-21: 300 mg via ORAL
  Filled 2021-11-21: qty 1

## 2021-11-21 MED ORDER — HEMOSTATIC AGENTS (NO CHARGE) OPTIME
TOPICAL | Status: DC | PRN
Start: 2021-11-21 — End: 2021-11-21
  Administered 2021-11-21: 1 via TOPICAL

## 2021-11-21 MED ORDER — ROSUVASTATIN CALCIUM 5 MG PO TABS
5.0000 mg | ORAL_TABLET | Freq: Every day | ORAL | Status: DC
  Administered 2021-11-22: 5 mg via ORAL
  Filled 2021-11-21: qty 1

## 2021-11-21 MED ORDER — FAMOTIDINE 20 MG PO TABS
20.0000 mg | ORAL_TABLET | Freq: Every evening | ORAL | Status: DC
Start: 2021-11-21 — End: 2021-11-22
  Administered 2021-11-21: 20 mg via ORAL
  Filled 2021-11-21: qty 1

## 2021-11-21 MED ORDER — AMISULPRIDE (ANTIEMETIC) 5 MG/2ML IV SOLN: 10.0000 mg | Freq: Once | INTRAVENOUS | Status: DC | PRN

## 2021-11-21 MED ORDER — FENTANYL CITRATE (PF) 100 MCG/2ML IJ SOLN
INTRAMUSCULAR | Status: DC | PRN
  Administered 2021-11-21: 50 ug via INTRAVENOUS
  Administered 2021-11-21: 100 ug via INTRAVENOUS
  Administered 2021-11-21 (×2): 50 ug via INTRAVENOUS

## 2021-11-21 MED ORDER — ONDANSETRON 4 MG PO TBDP: 4.0000 mg | ORAL_TABLET | Freq: Four times a day (QID) | ORAL | Status: DC | PRN

## 2021-11-21 MED ORDER — ROCURONIUM BROMIDE 10 MG/ML (PF) SYRINGE
PREFILLED_SYRINGE | INTRAVENOUS | Status: DC | PRN
  Administered 2021-11-21: 80 mg via INTRAVENOUS
  Administered 2021-11-21: 20 mg via INTRAVENOUS

## 2021-11-21 MED ORDER — FLUTICASONE FUROATE-VILANTEROL 100-25 MCG/ACT IN AEPB
1.0000 | INHALATION_SPRAY | Freq: Every day | RESPIRATORY_TRACT | Status: DC
  Administered 2021-11-22: 1 via RESPIRATORY_TRACT
  Filled 2021-11-21: qty 28

## 2021-11-21 MED ORDER — CHLORHEXIDINE GLUCONATE 0.12 % MT SOLN
15.0000 mL | Freq: Once | OROMUCOSAL | Status: AC
  Administered 2021-11-21: 15 mL via OROMUCOSAL
  Filled 2021-11-21: qty 15

## 2021-11-21 MED ORDER — DOCUSATE SODIUM 100 MG PO CAPS
100.0000 mg | ORAL_CAPSULE | Freq: Two times a day (BID) | ORAL | Status: DC
  Administered 2021-11-21 – 2021-11-22 (×2): 100 mg via ORAL
  Filled 2021-11-21 (×2): qty 1

## 2021-11-21 MED ORDER — ONDANSETRON HCL 4 MG/2ML IJ SOLN: 4.0000 mg | Freq: Four times a day (QID) | INTRAMUSCULAR | Status: DC | PRN

## 2021-11-21 MED ORDER — ALBUTEROL SULFATE (2.5 MG/3ML) 0.083% IN NEBU: 2.5000 mg | INHALATION_SOLUTION | Freq: Four times a day (QID) | RESPIRATORY_TRACT | Status: DC | PRN

## 2021-11-21 MED ORDER — ONDANSETRON HCL 4 MG/2ML IJ SOLN
INTRAMUSCULAR | Status: DC | PRN
  Administered 2021-11-21: 4 mg via INTRAVENOUS

## 2021-11-21 MED ORDER — OXYCODONE HCL 5 MG/5ML PO SOLN: 5.0000 mg | Freq: Once | ORAL | Status: DC | PRN

## 2021-11-21 MED ORDER — FENTANYL CITRATE (PF) 250 MCG/5ML IJ SOLN
INTRAMUSCULAR | Status: AC
  Filled 2021-11-21: qty 5

## 2021-11-21 MED ORDER — ROCURONIUM BROMIDE 10 MG/ML (PF) SYRINGE
PREFILLED_SYRINGE | INTRAVENOUS | Status: AC
  Filled 2021-11-21: qty 10

## 2021-11-21 MED ORDER — DUTASTERIDE 0.5 MG PO CAPS
0.5000 mg | ORAL_CAPSULE | Freq: Every day | ORAL | Status: DC
  Administered 2021-11-22: 0.5 mg via ORAL
  Filled 2021-11-21: qty 1

## 2021-11-21 MED ORDER — HYDRALAZINE HCL 20 MG/ML IJ SOLN
10.0000 mg | INTRAMUSCULAR | Status: DC | PRN
Start: 2021-11-21 — End: 2021-11-22

## 2021-11-21 MED ORDER — ONDANSETRON HCL 4 MG/2ML IJ SOLN
INTRAMUSCULAR | Status: AC
  Filled 2021-11-21: qty 2

## 2021-11-21 MED ORDER — ASPIRIN EC 81 MG PO TBEC
81.0000 mg | DELAYED_RELEASE_TABLET | Freq: Every day | ORAL | Status: DC
  Administered 2021-11-22: 81 mg via ORAL
  Filled 2021-11-21: qty 1

## 2021-11-21 MED ORDER — ORAL CARE MOUTH RINSE: 15.0000 mL | Freq: Once | OROMUCOSAL | Status: AC

## 2021-11-21 MED ORDER — 0.9 % SODIUM CHLORIDE (POUR BTL) OPTIME
TOPICAL | Status: DC | PRN
  Administered 2021-11-21 (×2): 1000 mL

## 2021-11-21 MED ORDER — SODIUM CHLORIDE 0.9 % IR SOLN
Status: DC | PRN
  Administered 2021-11-21: 1000 mL

## 2021-11-21 MED ORDER — PHENYLEPHRINE HCL-NACL 20-0.9 MG/250ML-% IV SOLN
INTRAVENOUS | Status: DC | PRN
  Administered 2021-11-21: 20 ug/min via INTRAVENOUS

## 2021-11-21 MED ORDER — ENSURE PRE-SURGERY PO LIQD: 296.0000 mL | Freq: Once | ORAL | Status: DC

## 2021-11-21 MED ORDER — OXYCODONE HCL 5 MG PO TABS: 5.0000 mg | ORAL_TABLET | Freq: Once | ORAL | Status: DC | PRN

## 2021-11-21 MED ORDER — UMECLIDINIUM BROMIDE 62.5 MCG/ACT IN AEPB
1.0000 | INHALATION_SPRAY | Freq: Every day | RESPIRATORY_TRACT | Status: DC
  Administered 2021-11-22: 1 via RESPIRATORY_TRACT
  Filled 2021-11-21: qty 7

## 2021-11-21 MED ORDER — LACTATED RINGERS IV SOLN: INTRAVENOUS | Status: DC | PRN

## 2021-11-21 MED ORDER — HYDROMORPHONE HCL 1 MG/ML IJ SOLN
0.2500 mg | INTRAMUSCULAR | Status: DC | PRN
  Administered 2021-11-21 (×4): 0.5 mg via INTRAVENOUS

## 2021-11-21 MED ORDER — BUPIVACAINE-EPINEPHRINE (PF) 0.25% -1:200000 IJ SOLN
INTRAMUSCULAR | Status: AC
  Filled 2021-11-21: qty 30

## 2021-11-21 MED ORDER — TAMSULOSIN HCL 0.4 MG PO CAPS
0.4000 mg | ORAL_CAPSULE | Freq: Every day | ORAL | Status: DC
  Administered 2021-11-22: 0.4 mg via ORAL
  Filled 2021-11-21: qty 1

## 2021-11-21 MED ORDER — METHOCARBAMOL 500 MG PO TABS
500.0000 mg | ORAL_TABLET | Freq: Four times a day (QID) | ORAL | Status: DC
  Administered 2021-11-21 – 2021-11-22 (×3): 500 mg via ORAL
  Filled 2021-11-21 (×3): qty 1

## 2021-11-21 MED ORDER — ENOXAPARIN SODIUM 40 MG/0.4ML IJ SOSY
40.0000 mg | PREFILLED_SYRINGE | INTRAMUSCULAR | Status: DC
  Administered 2021-11-22: 40 mg via SUBCUTANEOUS
  Filled 2021-11-21: qty 0.4

## 2021-11-21 MED ORDER — DIPHENHYDRAMINE HCL 12.5 MG/5ML PO ELIX
12.5000 mg | ORAL_SOLUTION | Freq: Four times a day (QID) | ORAL | Status: DC | PRN
Start: 2021-11-21 — End: 2021-11-22
  Administered 2021-11-22: 12.5 mg via ORAL
  Filled 2021-11-21: qty 5

## 2021-11-21 MED ORDER — DIPHENHYDRAMINE HCL 50 MG/ML IJ SOLN: 12.5000 mg | Freq: Four times a day (QID) | INTRAMUSCULAR | Status: DC | PRN

## 2021-11-21 MED ORDER — LACTATED RINGERS IV SOLN: INTRAVENOUS | Status: DC

## 2021-11-21 MED ORDER — DEXAMETHASONE SODIUM PHOSPHATE 10 MG/ML IJ SOLN
INTRAMUSCULAR | Status: AC
  Filled 2021-11-21: qty 1

## 2021-11-21 MED ORDER — CEFAZOLIN SODIUM-DEXTROSE 2-4 GM/100ML-% IV SOLN
2.0000 g | INTRAVENOUS | Status: AC
  Administered 2021-11-21: 2 g via INTRAVENOUS
  Filled 2021-11-21: qty 100

## 2021-11-21 MED ORDER — HYDROMORPHONE HCL 1 MG/ML IJ SOLN
INTRAMUSCULAR | Status: AC
  Filled 2021-11-21: qty 1

## 2021-11-21 MED ORDER — ENSURE PRE-SURGERY PO LIQD: 592.0000 mL | Freq: Once | ORAL | Status: DC

## 2021-11-21 MED ORDER — LIDOCAINE 2% (20 MG/ML) 5 ML SYRINGE
INTRAMUSCULAR | Status: AC
  Filled 2021-11-21: qty 5

## 2021-11-21 MED ORDER — LIDOCAINE 2% (20 MG/ML) 5 ML SYRINGE
INTRAMUSCULAR | Status: DC | PRN
  Administered 2021-11-21: 80 mg via INTRAVENOUS

## 2021-11-21 SURGICAL SUPPLY — 76 items
ADH SKN CLS APL DERMABOND .7 (GAUZE/BANDAGES/DRESSINGS) ×3
APL PRP STRL LF DISP 70% ISPRP (MISCELLANEOUS) ×3
APL SRG 38 LTWT LNG FL B (MISCELLANEOUS) ×3
APPLICATOR ARISTA FLEXITIP XL (MISCELLANEOUS) ×2 IMPLANT
BAG COUNTER SPONGE SURGICOUNT (BAG) ×4 IMPLANT
BAG RETRIEVAL 10 (BASKET) ×1
BAG SPNG CNTER NS LX DISP (BAG) ×3
BLADE CLIPPER SURG (BLADE) ×2 IMPLANT
CANISTER SUCT 3000ML PPV (MISCELLANEOUS) ×4 IMPLANT
CHLORAPREP W/TINT 26 (MISCELLANEOUS) ×4 IMPLANT
CNTNR URN SCR LID CUP LEK RST (MISCELLANEOUS) IMPLANT
CONT SPEC 4OZ STRL OR WHT (MISCELLANEOUS)
COVER SURGICAL LIGHT HANDLE (MISCELLANEOUS) ×4 IMPLANT
DERMABOND ADVANCED (GAUZE/BANDAGES/DRESSINGS) ×1
DERMABOND ADVANCED .7 DNX12 (GAUZE/BANDAGES/DRESSINGS) ×5 IMPLANT
DRAPE INCISE IOBAN 66X45 STRL (DRAPES) ×4 IMPLANT
DRAPE LAPAROSCOPIC ABDOMINAL (DRAPES) ×4 IMPLANT
DRAPE WARM FLUID 44X44 (DRAPES) ×6 IMPLANT
DRSG TELFA 3X8 NADH (GAUZE/BANDAGES/DRESSINGS) IMPLANT
ELECT CAUTERY BLADE 6.4 (BLADE) ×4 IMPLANT
ELECT PAD DSPR THERM+ ADLT (MISCELLANEOUS) ×4 IMPLANT
ELECT REM PT RETURN 9FT ADLT (ELECTROSURGICAL) ×4
ELECTRODE REM PT RTRN 9FT ADLT (ELECTROSURGICAL) ×3 IMPLANT
GLOVE BIOGEL PI IND STRL 6 (GLOVE) ×3 IMPLANT
GLOVE BIOGEL PI INDICATOR 6 (GLOVE) ×1
GLOVE BIOGEL PI MICRO 5.5 (GLOVE) ×2
GLOVE BIOGEL PI MICRO STRL 5.5 (GLOVE) ×6 IMPLANT
GLOVE SURG POLY MICRO LF SZ5.5 (GLOVE) ×6 IMPLANT
GLOVE SURG UNDER POLY LF SZ6 (GLOVE) ×4 IMPLANT
GOWN STRL REUS W/ TWL LRG LVL3 (GOWN DISPOSABLE) ×6 IMPLANT
GOWN STRL REUS W/TWL LRG LVL3 (GOWN DISPOSABLE) ×8
HANDLE SUCTION POOLE (INSTRUMENTS) ×3 IMPLANT
HEMOSTAT ARISTA ABSORB 3G PWDR (HEMOSTASIS) ×2 IMPLANT
HEMOSTAT SNOW SURGICEL 2X4 (HEMOSTASIS) ×4 IMPLANT
KIT BASIN OR (CUSTOM PROCEDURE TRAY) ×4 IMPLANT
KIT TURNOVER KIT B (KITS) ×4 IMPLANT
NDL INSUFFLATION 14GA 120MM (NEEDLE) ×2 IMPLANT
NEEDLE INSUFFLATION 14GA 120MM (NEEDLE) ×4 IMPLANT
NS IRRIG 1000ML POUR BTL (IV SOLUTION) ×8 IMPLANT
PACK BASIC III (CUSTOM PROCEDURE TRAY) ×4
PACK SRG BSC III STRL LF ECLPS (CUSTOM PROCEDURE TRAY) ×1 IMPLANT
PAD ARMBOARD 7.5X6 YLW CONV (MISCELLANEOUS) ×8 IMPLANT
PAD DRESSING TELFA 3X8 NADH (GAUZE/BANDAGES/DRESSINGS) IMPLANT
PENCIL SMOKE EVACUATOR (MISCELLANEOUS) ×4 IMPLANT
RELOAD STAPLE 60 2.6 WHT THN (STAPLE) IMPLANT
RELOAD STAPLER WHITE 60MM (STAPLE) ×24 IMPLANT
SCISSORS LAP 5X35 DISP (ENDOMECHANICALS) ×2 IMPLANT
SEALER BIPOLAR AQUA END DBS8.7 (INSTRUMENTS) ×2 IMPLANT
SET IRRIG TUBING LAPAROSCOPIC (IRRIGATION / IRRIGATOR) ×2 IMPLANT
SET TUBE SMOKE EVAC HIGH FLOW (TUBING) ×4 IMPLANT
SHEARS HARMONIC ACE PLUS 36CM (ENDOMECHANICALS) ×2 IMPLANT
SLEEVE ENDOPATH XCEL 5M (ENDOMECHANICALS) ×10 IMPLANT
SPONGE T-LAP 18X18 ~~LOC~~+RFID (SPONGE) ×2 IMPLANT
STAPLE ECHEON FLEX 60 POW ENDO (STAPLE) ×2 IMPLANT
STAPLER RELOAD WHITE 60MM (STAPLE) ×32
SUCTION POOLE HANDLE (INSTRUMENTS) ×4
SUT CHROMIC 0 CT 1 36 (SUTURE) ×2 IMPLANT
SUT MNCRL AB 4-0 PS2 18 (SUTURE) ×6 IMPLANT
SUT PDS AB 1 TP1 96 (SUTURE) ×4 IMPLANT
SUT PROLENE 4 0 RB 1 (SUTURE) ×4
SUT PROLENE 4-0 RB1 .5 CRCL 36 (SUTURE) ×5 IMPLANT
SUT SILK 2 0SH CR/8 30 (SUTURE) IMPLANT
SUT VIC AB 3-0 SH 27 (SUTURE) ×4
SUT VIC AB 3-0 SH 27X BRD (SUTURE) ×3 IMPLANT
SUT VICRYL 0 UR6 27IN ABS (SUTURE) ×2 IMPLANT
SYR BULB IRRIG 60ML STRL (SYRINGE) ×4 IMPLANT
SYS BAG RETRIEVAL 10MM (BASKET) ×3
SYSTEM BAG RETRIEVAL 10MM (BASKET) ×1 IMPLANT
TOWEL GREEN STERILE (TOWEL DISPOSABLE) ×4 IMPLANT
TOWEL GREEN STERILE FF (TOWEL DISPOSABLE) ×4 IMPLANT
TRAY FOLEY MTR SLVR 14FR STAT (SET/KITS/TRAYS/PACK) ×4 IMPLANT
TRAY LAPAROSCOPIC MC (CUSTOM PROCEDURE TRAY) ×4 IMPLANT
TROCAR XCEL BLUNT TIP 100MML (ENDOMECHANICALS) ×2 IMPLANT
TROCAR XCEL NON-BLD 5MMX100MML (ENDOMECHANICALS) ×4 IMPLANT
TUBE CONNECTING 12X1/4 (SUCTIONS) ×2 IMPLANT
WARMER LAPAROSCOPE (MISCELLANEOUS) ×6 IMPLANT

## 2021-11-21 NOTE — Op Note (Signed)
Date: 11/21/21 ? ?Patient: George Watson ?MRN: [id number] ? ?Preoperative Diagnosis: Hepatocellular carcinoma ?Postoperative Diagnosis: Same ? ?Procedure: Staging laparoscopy, laparoscopic partial left hepatectomy (segment 3), intraoperative liver ultrasound ? ?Surgeon: Michaelle Birks, MD ?Assistant: Stark Klein, MD ? ?EBL: 50 mL ? ?Anesthesia: General endotracheal ? ?Specimens: Partial left hepatectomy ? ?Indications: George Watson is a 76 yo male with a history of HVC infection (previously treated) who was undergoing routine [place] surveillance of the liver and was found to have a mass in the left lateral liver. Further workup with MRI showed a LiRADS-4 lesion, and percutaneous biopsy confirmed HCC. After a discussion of the risks and benefits of surgery, he agreed to proceed with resection. ? ?Findings: 2.5cm mass in segment 3 of the liver, with no evidence of additional lesions on intraoperative ultrasound. No metastatic disease within the abdomen. ? ?Procedure details: Informed consent was obtained in the preoperative area prior to the procedure. The patient was brought to the operating room and placed on the table in the supine position. General anesthesia was induced and appropriate lines and drains were placed for intraoperative monitoring. Perioperative antibiotics were administered per SCIP guidelines. The abdomen was prepped and draped in the usual sterile fashion. A pre-procedure timeout was taken verifying patient identity, surgical site and procedure to be performed. ? ?A supraumbilical skin incision was made, the subcutaneous tissue was divided with cautery, and the fascia was grasped and elevated. The fascia and peritoneum were incised and the peritoneal cavity was directly visualized.  A 12 mm Hassan trocar was placed.  A 5 mm port was placed in the left upper quadrant, and two 5 mm ports were placed in the right upper quadrant, all under direct visualization.  The liver was fibrotic and steatotic in  appearance, but not overtly cirrhotic.  The left lateral lobe was gently elevated, and a malignant appearing mass was visualized on the inferior surface of segment 3, corresponding to the preoperative imaging findings.  No other nodules were noted on the surface of the liver.  Laparoscopic ultrasound was performed of both lobes of the liver, and no additional lesions were identified.  The known George Watson was measured at 2.5 cm in diameter.  Next, the falciform ligament was divided with the harmonic and taken down off the abdominal wall.  Additional 5 mm ports were placed in the right mid abdomen and left mid abdomen.  The lesion appeared to be resectable by removing segment 3 of the left lateral liver.  The planned margin of resection was demarcated on the surface of the liver using cautery.  A 0 chromic stay suture was then placed at the edge of the planned specimen to aid in retraction.  The superficial liver parenchyma was then divided with harmonic shears.  The vascular pedicle to segment 3 was divided with an Echelon stapler with a white load. The remaining liver parenchyma was divided with serial fires of the stapler using white loads. The specimen was placed in an EndoCatch bag and removed via the 12 mm port site, which required the port site to be extended inferiorly.  The Specimen was examined on the back table and it was clear that the entire tumor had been resected in tact with a grossly negative margin.  The specimen was sent for routine pathology.  The surgical site was irrigated and hemostasis was achieved on the cut surface of the liver using Aquamantys.  A Valsalva maneuver was performed and there was no evidence of active bleeding or bile leak.  Hemoblast was applied to the cut surface of the liver.  The ports were removed under direct visualization and the abdomen was desufflated.  The 12 mm port site fascia was closed with 0 Vicryl figure-of-eight sutures, and the subcutaneous layer was closed with a  running 3-0 Vicryl suture.  The skin at all port sites was closed with 4-0 Monocryl subcuticular suture.  Dermabond was applied. ? ?The patient tolerated the procedure well with no apparent complications.  All counts were correct x2 at the end of the procedure. The patient was extubated and taken to PACU in stable condition. ? ?Michaelle Birks, MD ?11/21/21 ?12:16 PM ? ? ?

## 2021-11-21 NOTE — Interval H&P Note (Signed)
History and Physical Interval Note: ? ?11/21/2021 ?9:15 AM ? ?George Watson  has presented today for surgery, with the diagnosis of HEPATOCELLULAR CARCINOMA.  The various methods of treatment have been discussed with the patient and family. After consideration of risks, benefits and other options for treatment, the patient has consented to  Procedure(s): ?LAPAROSCOPIC PARTIAL LEFT HEPATECTOMY WITH INTROPERATIVE ULTRASOUND (N/A) ?STAGING LAPAROSCOPY (N/A) ?POSSIBLE OPEN PARTIAL LEFT HEPATECTOMY (N/A) as a surgical intervention.  The patient's history has been reviewed, patient examined, no change in status, stable for surgery.  I have reviewed the patient's chart and labs.  Questions were answered to the patient's satisfaction.  Biopsy confirmed HCC. Patient was recently treated as an outpatient for a COPD exacerbation with a steroid taper, and symptoms have resolved. He is now off steroids. Proceed to OR, admit postoperatively. ? ? ?Dwan Bolt ? ? ?

## 2021-11-21 NOTE — Anesthesia Procedure Notes (Signed)
Procedure Name: Intubation ?Date/Time: 11/21/2021 9:41 AM ?Performed by: Genelle Bal, CRNA ?Pre-anesthesia Checklist: Patient identified, Emergency Drugs available, Suction available and Patient being monitored ?Patient Re-evaluated:Patient Re-evaluated prior to induction ?Oxygen Delivery Method: Circle system utilized ?Preoxygenation: Pre-oxygenation with 100% oxygen ?Induction Type: IV induction ?Ventilation: Mask ventilation without difficulty ?Laryngoscope Size: Mac and 4 ?Grade View: Grade I ?Tube type: Oral ?Tube size: 7.5 mm ?Number of attempts: 1 ?Airway Equipment and Method: Stylet and Oral airway ?Placement Confirmation: ETT inserted through vocal cords under direct vision, positive ETCO2 and breath sounds checked- equal and bilateral ?Secured at: 22 cm ?Tube secured with: Tape ?Dental Injury: Teeth and Oropharynx as per pre-operative assessment  ?Comments: Intubation by Paramedic student Legrand Como ? ? ? ? ?

## 2021-11-21 NOTE — Anesthesia Postprocedure Evaluation (Signed)
Anesthesia Post Note ? ?Patient: George Watson ? ?Procedure(s) Performed: LAPAROSCOPIC PARTIAL HEPATECTOMY (Left: Abdomen) ?STAGING LAPAROSCOPY (Abdomen) ?LAPAROSCOPIC LIVER ULTRASOUND (Abdomen) ? ?  ? ?Patient location during evaluation: PACU ?Anesthesia Type: General ?Level of consciousness: awake ?Pain management: pain level controlled ?Vital Signs Assessment: post-procedure vital signs reviewed and stable ?Respiratory status: spontaneous breathing and respiratory function stable ?Cardiovascular status: stable ?Postop Assessment: no apparent nausea or vomiting ?Anesthetic complications: no ? ? ?No notable events documented. ? ?Last Vitals:  ?Vitals:  ? 11/21/21 1232 11/21/21 1247  ?BP: (!) 145/79 138/78  ?Pulse: 77 83  ?Resp: 17 18  ?Temp:    ?SpO2: 97% 96%  ?  ?Last Pain:  ?Vitals:  ? 11/21/21 1232  ?TempSrc:   ?PainSc: 0-No pain  ? ? ?  ?  ?  ?  ?  ?  ? ?Merlinda Frederick ? ? ? ? ?

## 2021-11-21 NOTE — Transfer of Care (Signed)
Immediate Anesthesia Transfer of Care Note ? ?Patient: George Watson ? ?Procedure(s) Performed: LAPAROSCOPIC PARTIAL HEPATECTOMY (Left: Abdomen) ?STAGING LAPAROSCOPY (Abdomen) ?LAPAROSCOPIC LIVER ULTRASOUND (Abdomen) ? ?Patient Location: PACU ? ?Anesthesia Type:General ? ?Level of Consciousness: awake, alert  and oriented ? ?Airway & Oxygen Therapy: Patient Spontanous Breathing and Patient connected to face mask oxygen ? ?Post-op Assessment: Report given to RN and Post -op Vital signs reviewed and stable ? ?Post vital signs: Reviewed and stable ? ?Last Vitals:  ?Vitals Value Taken Time  ?BP 134/71 11/21/21 1132  ?Temp    ?Pulse 70 11/21/21 1133  ?Resp 14 11/21/21 1133  ?SpO2 99 % 11/21/21 1133  ?Vitals shown include unvalidated device data. ? ?Last Pain:  ?Vitals:  ? 11/21/21 1132  ?TempSrc:   ?PainSc: 0-No pain  ?   ? ?  ? ?Complications: No notable events documented. ?

## 2021-11-22 ENCOUNTER — Encounter (HOSPITAL_COMMUNITY): Payer: Self-pay | Admitting: Surgery

## 2021-11-22 LAB — CBC
HCT: 41.6 % (ref 39.0–52.0)
Hemoglobin: 14 g/dL (ref 13.0–17.0)
MCH: 31 pg (ref 26.0–34.0)
MCHC: 33.7 g/dL (ref 30.0–36.0)
MCV: 92.2 fL (ref 80.0–100.0)
Platelets: 238 10*3/uL (ref 150–400)
RBC: 4.51 MIL/uL (ref 4.22–5.81)
RDW: 12.7 % (ref 11.5–15.5)
WBC: 17.6 10*3/uL — ABNORMAL HIGH (ref 4.0–10.5)
nRBC: 0 % (ref 0.0–0.2)

## 2021-11-22 LAB — COMPREHENSIVE METABOLIC PANEL
ALT: 118 U/L — ABNORMAL HIGH (ref 0–44)
AST: 102 U/L — ABNORMAL HIGH (ref 15–41)
Albumin: 3.3 g/dL — ABNORMAL LOW (ref 3.5–5.0)
Alkaline Phosphatase: 38 U/L (ref 38–126)
Anion gap: 10 (ref 5–15)
BUN: 14 mg/dL (ref 8–23)
CO2: 21 mmol/L — ABNORMAL LOW (ref 22–32)
Calcium: 8.9 mg/dL (ref 8.9–10.3)
Chloride: 105 mmol/L (ref 98–111)
Creatinine, Ser: 0.92 mg/dL (ref 0.61–1.24)
GFR, Estimated: >60 mL/min (ref >60)
Glucose, Bld: 190 mg/dL — ABNORMAL HIGH (ref 70–99)
Potassium: 4.2 mmol/L (ref 3.5–5.1)
Sodium: 136 mmol/L (ref 135–145)
Total Bilirubin: 0.8 mg/dL (ref 0.3–1.2)
Total Protein: 6.1 g/dL — ABNORMAL LOW (ref 6.5–8.1)

## 2021-11-22 MED ORDER — DOCUSATE SODIUM 100 MG PO CAPS: 100.0000 mg | ORAL_CAPSULE | Freq: Two times a day (BID) | ORAL | 0 refills | Status: DC

## 2021-11-22 MED ORDER — TRAMADOL HCL 50 MG PO TABS
50.0000 mg | ORAL_TABLET | Freq: Four times a day (QID) | ORAL | Status: DC | PRN
  Administered 2021-11-22: 50 mg via ORAL
  Filled 2021-11-22: qty 1

## 2021-11-22 MED ORDER — TRAMADOL HCL 50 MG PO TABS: 50.0000 mg | ORAL_TABLET | Freq: Four times a day (QID) | ORAL | 0 refills | Status: DC | PRN

## 2021-11-22 NOTE — Progress Notes (Signed)
Patient was able to ambulate overnight. Tolerating PO without difficulty, denies nausea/vomiting. Excellent UOP, labs unremarkable. Transaminases mildly elevated as expected.  ? ?Foley removed on rounds. Discussed discharge this afternoon if patient is able to void and ambulate this morning. He would like to go home. Plan to discharge after lunch if pain controlled and voiding. ?

## 2021-11-22 NOTE — Discharge Instructions (Addendum)
[facility] [place] DISCHARGE INSTRUCTIONS ? ?Activity ?No heavy lifting greater than 15 pounds for 6 weeks after surgery. ?Ok to shower, but do not bathe or submerge incisions underwater. ?Do not drive while taking narcotic pain medication. ? ?Wound Care ?Your incisions are covered with skin glue called Dermabond. This will peel off on its own over time. ?You may shower and allow warm soapy water to run over your incisions. Gently pat dry. ?Do not submerge your incision underwater. ?Monitor your incision for any new redness, tenderness, or drainage. ? ?When to Call us: ?Fever greater than 100.5 ?New redness, drainage, or swelling at incision site ?Severe pain, nausea, or vomiting ?Jaundice (yellowing of the whites of the eyes or skin) ? ?Follow-up ?You have an appointment scheduled with Dr. Zenia Resides in 3 weeks. This will be at the [facility] Surgery office at [address], [place], [place], [place]. Please arrive at least 15 minutes prior to your scheduled appointment time. ? ?For questions or concerns, please call the office at [phone]. ? ?

## 2021-11-23 NOTE — Discharge Summary (Signed)
Physician Discharge Summary  ? ?Patient ID: ?George Watson ?[id number] ?76 y.o. ?Oct 24, 1945 ? ?Admit date: 11/21/2021 ? ?Discharge date and time: 11/22/2021  2:23 PM  ? ?Admitting Physician: Dwan Bolt, MD  ? ?Discharge Physician: Michaelle Birks, MD ? ?Admission Diagnoses: Hepatocellular carcinoma ([place]) [C22.0] ? ?Discharge Diagnoses: Hepatocellular carcinoma ? ?Admission Condition: good ? ?Discharged Condition: good ? ?Indication for Admission: George Watson is a 76 yo male with a history of HCV infection, previously treated, and was undergoing routine [place] surveillance. He was noted to have a left liver mass on ultrasound, which on further workup was suspicious for malignancy. Biopsy confirmed HCC. He underwent a multidisciplinary evaluation and after an extensive discussion of the risks and benefits of surgery, agreed to proceed with surgical resection. ? ?Hospital Course: The patient was taken to the OR on 11/21/21 for a laparoscopic partial left hepatectomy. For further details of the procedure, please see separately dictated operative report. Postoperatively he recovered in PACU and was admitted to the progressive care unit in stable condition. He was advanced to a regular diet, which he tolerated. On POD1 his foley was removed and he was able to void. Hgb was stable and he had no signs or symptoms of bleeding. Transaminases were appropriately mildly elevated but bilirubin remained normal. His pain was controlled with oral medications and he was able to ambulate. He was examined and deemed appropriate for discharge home, without outpatient clinic follow up in 3 weeks. ? ?Consults: None ? ?Significant Diagnostic Studies: N/A ? ?Treatments: analgesia: tramadol and robaxin; and surgery: Staging laparoscopic, laparoscopic partial left hepatectomy with intraoperative ultrasound ? ?Discharge Exam: ?General: resting comfortably, NAD ?Neuro: alert and oriented, no focal deficits ?Resp: normal work of breathing on room  air ?Abdomen: soft, nondistended, appropriately tender. Incisions clean and dry with no erythema or induration. ?Extremities: warm and well-perfused ? ? ?Disposition: Discharge disposition: 01-Home or Self Care ? ? ? ? ? ? ?Patient Instructions:  ?Allergies as of 11/22/2021   ?No Known Allergies ?  ? ?  ?Medication List  ?  ? ?TAKE these medications   ? ?albuterol (2.5 MG/3ML) 0.083% nebulizer solution ?Commonly known as: PROVENTIL ?Take 3 mLs (2.5 mg total) by nebulization every 6 (six) hours as needed for wheezing or shortness of breath. ?  ?albuterol 108 (90 Base) MCG/ACT inhaler ?Commonly known as: VENTOLIN HFA ?INHALE 2 PUFFS INTO THE LUNGS EVERY 6 HOURS AS NEEDED FOR SHORTNESS OF BREATH ?  ?aspirin 81 MG EC tablet ?Take 1 tablet (81 mg total) by mouth daily. ?  ?B COMPLEX PO ?Take 1 tablet by mouth daily. ?  ?CENTRUM SILVER PO ?Take 1 tablet by mouth daily. ?  ?cholecalciferol 25 MCG (1000 UNIT) tablet ?Commonly known as: VITAMIN D3 ?Take 1,000 Units by mouth daily. ?  ?Coenzyme Q10-Vitamin E 100-300 MG-UNIT Chew ?Chew 1 tablet by mouth daily. ?  ?diazepam 2 MG tablet ?Commonly known as: VALIUM ?Take 1-2 tabs 30 minutes before flight. ?  ?diphenoxylate-atropine 2.5-0.025 MG tablet ?Commonly known as: Lomotil ?Take 1 tablet by mouth 4 (four) times daily as needed for diarrhea or loose stools. ?  ?docusate sodium 100 MG capsule ?Commonly known as: COLACE ?Take 1 capsule (100 mg total) by mouth 2 (two) times daily. ?  ?dutasteride 0.5 MG capsule ?Commonly known as: AVODART ?Take 0.5 mg by mouth daily. ?  ?famotidine 20 MG tablet ?Commonly known as: PEPCID ?Take 20 mg by mouth every evening. ?  ?FISH OIL PO ?Take 1 capsule by mouth daily. ?  ?  Fluticasone-Umeclidin-Vilant 100-62.5-25 MCG/ACT Aepb ?Commonly known as: Trelegy Ellipta ?TAKE 1 PUFF BY MOUTH EVERY DAY ?  ?glucosamine-chondroitin 500-400 MG tablet ?Take 3 tablets by mouth daily. ?What changed: how much to take ?  ?hydrochlorothiazide 12.5 MG  capsule ?Commonly known as: MICROZIDE ?TAKE 1 CAPSULE BY MOUTH EVERY DAY ?  ?losartan 100 MG tablet ?Commonly known as: COZAAR ?TAKE 1 TABLET BY MOUTH EVERY DAY ?  ?metaxalone 800 MG tablet ?Commonly known as: SKELAXIN ?TAKE 1 TABLET (800 MG TOTAL) BY MOUTH 3 (THREE) TIMES DAILY AS NEEDED FOR MUSCLE SPASMS. ?  ?rosuvastatin 5 MG tablet ?Commonly known as: CRESTOR ?TAKE 1 TABLET (5 MG TOTAL) BY MOUTH DAILY. ?  ?tamsulosin 0.4 MG Caps capsule ?Commonly known as: FLOMAX ?Take 0.4 mg by mouth daily. ?  ?traMADol 50 MG tablet ?Commonly known as: ULTRAM ?Take 1 tablet (50 mg total) by mouth every 6 (six) hours as needed for severe pain. ?  ?TURMERIC CURCUMIN PO ?Take 2 capsules by mouth daily. ?  ? ?  ? ?Activity: no driving while on analgesics and no heavy lifting for 6 weeks ?Diet: regular diet ?Wound Care: keep wound clean and dry ? ?Follow-up with Dr. Zenia Resides on 12/13/21. ? ?Signed: ?Dwan Bolt ?11/23/2021 ?11:48 AM ? ?

## 2021-11-24 LAB — TYPE AND SCREEN
ABO/RH(D): O POS
Antibody Screen: NEGATIVE
Unit division: 0
Unit division: 0
Unit division: 0
Unit division: 0

## 2021-11-24 LAB — BPAM RBC
Blood Product Expiration Date: 202306032359
Blood Product Expiration Date: 202306032359
Blood Product Expiration Date: 202306032359
Blood Product Expiration Date: 202306032359
Unit Type and Rh: 5100
Unit Type and Rh: 5100
Unit Type and Rh: 5100
Unit Type and Rh: 5100

## 2021-11-26 LAB — SURGICAL PATHOLOGY

## 2021-11-27 DIAGNOSIS — J441 Chronic obstructive pulmonary disease with (acute) exacerbation: Secondary | ICD-10-CM | POA: Diagnosis not present

## 2021-12-03 DIAGNOSIS — L821 Other seborrheic keratosis: Secondary | ICD-10-CM | POA: Diagnosis not present

## 2021-12-03 DIAGNOSIS — L570 Actinic keratosis: Secondary | ICD-10-CM | POA: Diagnosis not present

## 2021-12-03 DIAGNOSIS — L814 Other melanin hyperpigmentation: Secondary | ICD-10-CM | POA: Diagnosis not present

## 2021-12-18 ENCOUNTER — Other Ambulatory Visit: Payer: Self-pay | Admitting: Emergency Medicine

## 2021-12-19 ENCOUNTER — Encounter: Payer: Self-pay | Admitting: Gastroenterology

## 2021-12-19 NOTE — Telephone Encounter (Signed)
Called and spoke with patient. He has been scheduled for a follow up appointment on Friday, 12/20/21 at 9 am.

## 2021-12-20 ENCOUNTER — Encounter: Payer: Self-pay | Admitting: Gastroenterology

## 2021-12-20 ENCOUNTER — Ambulatory Visit: Payer: PPO | Admitting: Gastroenterology

## 2021-12-20 VITALS — BP 152/100 | HR 72 | Ht 71.0 in | Wt 174.0 lb

## 2021-12-20 DIAGNOSIS — R198 Other specified symptoms and signs involving the digestive system and abdomen: Secondary | ICD-10-CM | POA: Diagnosis not present

## 2021-12-20 DIAGNOSIS — K580 Irritable bowel syndrome with diarrhea: Secondary | ICD-10-CM

## 2021-12-20 DIAGNOSIS — C220 Liver cell carcinoma: Secondary | ICD-10-CM | POA: Diagnosis not present

## 2021-12-20 MED ORDER — DIPHENOXYLATE-ATROPINE 2.5-0.025 MG PO TABS
1.0000 | ORAL_TABLET | Freq: Four times a day (QID) | ORAL | 1 refills | Status: DC | PRN
Start: 2021-12-20 — End: 2021-12-26

## 2021-12-20 MED ORDER — FLUTICASONE-UMECLIDIN-VILANT 100-62.5-25 MCG/ACT IN AEPB: INHALATION_SPRAY | RESPIRATORY_TRACT | 0 refills | Status: DC

## 2021-12-20 NOTE — Progress Notes (Signed)
HPI :  76 year old male here for follow-up visit.  Recall he has a history of suspected postinfectious IBS, COPD, history of hepatitis C with question of cirrhosis, and now newly diagnosed [place], here for follow-up visit.   Recall he has a history of hepatitis C since 1990s, treated with multiple rounds of interferon and ribavirin which he failed.  He eventually had treatment at [facility] in 2014 for hep C with new regimen and was eradicated.  Remote alcohol use but nothing recently.  There has been suggestions on remote ultrasounds that he had underlying cirrhosis however ultrasounds in recent years have not shown any evidence of that.  He has not had any decompensations of his liver.   Recall when I saw him in March of this year he had complained of some bloating in his abdomen and fluid retention.  There was concern over question of possible ascites so we obtained an abdominal ultrasound to assess for this.  There was no evidence of ascites in his abdomen but he did have a new liver mass on the ultrasound.  This was surprising because he had a liver ultrasound in December of last year for [facility] screening and there was no lesions noted. The ultrasound was confirmed with a follow-up MRI.  He was referred to oncology and underwent a liver biopsy which confirmed [place].  He was then referred to surgery, Dr. Zenia Resides, had resection of the mass with clear margins on May 4.  There is no evidence of known metastatic disease.  He tolerated the surgical resection quite well, was hospitalized for only a day and discharged home.  He is doing pretty well in this regard since of last seen him.  His surgical site has healed well.  He denies any abdominal pains.  He is feeling really well in this regard, has plans to have another MRI in about 3 months and is going to schedule follow-up with oncology.  Final path of his liver associated with this actually showed no evidence of cirrhosis.  Recall he had an extensive evaluation for  loose stools in the past, thought to have postinfectious IBS.  See prior notes for details of his work-up.  His bowels in general have been okay at baseline.  He has perhaps loose stool 1 day a week for which he needs to use Lomotil, this typically works really well for him.  His main complaint has been distention of his abdomen.  He feels he is not "gassy" causing this.  He feels his weight is less than it has been in the past but his abdomen remains distended.  He does not feel like it is any better at night, or after bowel movement.  He states it is there like that all the time.  He does core exercises at baseline for his lower back.  He states when he bends over his abdomen is protuberant and can affect his breathing at times.  Otherwise he is not in any pain with this.    Prior evaluation: Colonoscopy 09/11/20 - 54m rectal polyp, divertculosis, small hemorrhoids, normal ileum - biopsies taken to rule out microscopic colitis  - polyp sessile serrated, normal colon otherwise - repeat colon in 5 years   [place]5/09/2017: IMPRESSION: 1. Somewhat nodular contours of the liver may indicate changes of cirrhosis. No focal hepatic abnormality is seen. 2. No gallstones. 3. Abdominal aortic atherosclerosis.   [place]8/23/21: Hepatomegaly with cirrhotic morphology No focal lesions   EGD 12/05/20: - The exam of the  esophagus was otherwise normal. No varices. - The entire examined stomach was normal. No varices. - A single small angiodysplastic lesion was found in the second portion of the duodenum. - The duodenal bulb and second portion of the duodenum were otherwise normal.     [facility] [place] 07/10/21: IMPRESSION: Unremarkable right upper quadrant ultrasound.   C diff positive 01/10/21: Treated with vancomycin   [place] complete 10/01/21: IMPRESSION: 1. New 3.3 cm hypoechoic mass is noted the left lobe of the liver. Further evaluation with MRI of the abdomen is suggested. 2. No gallstones or biliary  distention.   AFP 9.6  MRI liver 10/04/21: IMPRESSION: 1. Mass in the left hepatic lobe segment 3 as described, consistent with hepatocellular carcinoma, LR 5. 2. Subcentimeter focus of early hyperenhancement in the right hepatic lobe without washout or corresponding signal abnormalities, LR 2.   Liver biopsy 10/24/21: FINAL MICROSCOPIC DIAGNOSIS:   A. LIVER, LEFT LOBE, BIOPSY:  - Moderately differentiated hepatocellular carcinoma, see comment  Had liver resection with Dr. Zenia Resides 5/4, discharged on 5/5 Preoperative Diagnosis: Hepatocellular carcinoma Postoperative Diagnosis: Same   Procedure: Staging laparoscopy, laparoscopic partial left hepatectomy (segment 3), intraoperative liver ultrasound  FINAL MICROSCOPIC DIAGNOSIS:   A.   LIVER, LEFT LATERAL, PARTIAL HEPATECTOMY:  -    Hepatocellular carcinoma, grade 2 (moderately differentiated), 2.8  cm in greatest dimension, margin negative for invasive carcinoma.  -    Non-cirrhotic liver with mild macrovesicular steatosis.          Past Medical History:  Diagnosis Date   Aortic aneurysm (HCC)    Arthritis    BPH (benign prostatic hypertrophy)    Cataract 2015   Cirrhosis (HCC)    COPD (chronic obstructive pulmonary disease) (HCC)    DDD (degenerative disc disease), lumbosacral    Dyspnea    Elevated PSA    Emphysema of lung (HCC)    Essential hypertension, benign    diet controlled   Fracture of shaft of clavicle    GERD (gastroesophageal reflux disease)    H/O drug abuse ([place])    multisubstance   Hepatitis B    Hepatitis C 10/1996   HSV-2 (herpes simplex virus 2) infection    Hx of biopsy 08/2004   liver   Hx of colonic polyps 2022   Hyperlipidemia    Inguinal hernia    right   Other and unspecified hyperlipidemia    Prostate cancer ([place])    Substance abuse ([place])    Tuberculosis      Past Surgical History:  Procedure Laterality Date   BACK SURGERY     CERVICAL SPINE SURGERY     EYE SURGERY  3/20    fracture ribs     3   HERNIA REPAIR     INGUINAL HERNIA REPAIR  07/01/2012   Procedure: HERNIA REPAIR INGUINAL ADULT;  Surgeon: Madilyn Hook, DO;  Location: [facility];  Service: General;  Laterality: Right;  with Mesh   LAPAROSCOPIC LIVER ULTRASOUND N/A 11/21/2021   Procedure: LAPAROSCOPIC LIVER ULTRASOUND;  Surgeon: Dwan Bolt, MD;  Location: [facility];  Service: General;  Laterality: N/A;   LAPAROSCOPIC PARTIAL HEPATECTOMY Left 11/21/2021   Procedure: LAPAROSCOPIC PARTIAL HEPATECTOMY;  Surgeon: Dwan Bolt, MD;  Location: [facility];  Service: General;  Laterality: Left;   LAPAROSCOPY N/A 11/21/2021   Procedure: STAGING LAPAROSCOPY;  Surgeon: Dwan Bolt, MD;  Location: [facility];  Service: General;  Laterality: N/A;   left knee meniscus repair  07/21/1990   right  rotator cuff  07/21/1990   right shoulder arthroscopy  07/21/2009   SPINE SURGERY  10/14   Family History  Problem Relation Age of Onset   Leukemia Mother    Throat cancer Father    Esophageal cancer Father    Cancer Father        esophageal cancer   Breast cancer Sister    Cancer Sister    Prostate cancer Brother    Melanoma Brother    Colon cancer Neg Hx    Stomach cancer Neg Hx    Pancreatic cancer Neg Hx    Liver disease Neg Hx    Social History   Tobacco Use   Smoking status: Former    Packs/day: 1.50    Years: 43.00    Pack years: 64.50    Types: Cigarettes    Quit date: 02/22/2003    Years since quitting: 18.8   Smokeless tobacco: Never  Vaping Use   Vaping Use: Never used  Substance Use Topics   Alcohol use: Not Currently    Comment: 1988   Drug use: Not Currently    Types: Cocaine, Heroin    Comment: 26 years ago cocaine, heroin. methadone   Current Outpatient Medications  Medication Sig Dispense Refill   albuterol (PROVENTIL) (2.5 MG/3ML) 0.083% nebulizer solution Take 3 mLs (2.5 mg total) by nebulization every 6 (six) hours as needed for wheezing or shortness of breath. 75 mL 5   albuterol  (VENTOLIN HFA) 108 (90 Base) MCG/ACT inhaler INHALE 2 PUFFS INTO THE LUNGS EVERY 6 HOURS AS NEEDED FOR SHORTNESS OF BREATH 18 each 1   aspirin EC 81 MG EC tablet Take 1 tablet (81 mg total) by mouth daily.     B Complex Vitamins (B COMPLEX PO) Take 1 tablet by mouth daily.     cholecalciferol (VITAMIN D3) 25 MCG (1000 UT) tablet Take 1,000 Units by mouth daily.     Coenzyme Q10-Vitamin E 100-300 MG-UNIT CHEW Chew 1 tablet by mouth daily.     diazepam (VALIUM) 2 MG tablet Take 1-2 tabs 30 minutes before flight. 10 tablet 0   diphenoxylate-atropine (LOMOTIL) 2.5-0.025 MG tablet Take 1 tablet by mouth 4 (four) times daily as needed for diarrhea or loose stools. 60 tablet 1   docusate sodium (COLACE) 100 MG capsule Take 1 capsule (100 mg total) by mouth 2 (two) times daily. 10 capsule 0   dutasteride (AVODART) 0.5 MG capsule Take 0.5 mg by mouth daily.     famotidine (PEPCID) 20 MG tablet Take 20 mg by mouth every evening.      Fluticasone-Umeclidin-Vilant (TRELEGY ELLIPTA) 100-62.5-25 MCG/ACT AEPB TAKE 1 PUFF BY MOUTH EVERY DAY 60 each 5   glucosamine-chondroitin 500-400 MG tablet Take 3 tablets by mouth daily. (Patient taking differently: Take 2 tablets by mouth daily.)     hydrochlorothiazide (MICROZIDE) 12.5 MG capsule TAKE 1 CAPSULE BY MOUTH EVERY DAY 90 capsule 3   losartan (COZAAR) 100 MG tablet TAKE 1 TABLET BY MOUTH EVERY DAY 90 tablet 0   metaxalone (SKELAXIN) 800 MG tablet TAKE 1 TABLET (800 MG TOTAL) BY MOUTH 3 (THREE) TIMES DAILY AS NEEDED FOR MUSCLE SPASMS. 30 tablet 0   Multiple Vitamins-Minerals (CENTRUM SILVER PO) Take 1 tablet by mouth daily.     Omega-3 Fatty Acids (FISH OIL PO) Take 1 capsule by mouth daily.     rosuvastatin (CRESTOR) 5 MG tablet TAKE 1 TABLET (5 MG TOTAL) BY MOUTH DAILY. 90 tablet 0   tamsulosin (FLOMAX) 0.4  MG CAPS capsule Take 0.4 mg by mouth daily.      traMADol (ULTRAM) 50 MG tablet Take 1 tablet (50 mg total) by mouth every 6 (six) hours as needed for severe  pain. 20 tablet 0   TURMERIC CURCUMIN PO Take 2 capsules by mouth daily.     No current facility-administered medications for this visit.   No Known Allergies   Review of Systems: All systems reviewed and negative except where noted in HPI.    Physical Exam: BP (!) 152/100   Pulse 72   Ht '5\' 11"'$  (1.803 m)   Wt 174 lb (78.9 kg)   BMI 24.27 kg/m  Constitutional: Pleasant,well-developed, male in no acute distress. Abdominal: Soft, protuberant abdomen, nontender. There are no masses palpable.  Extremities: no edema Neurological: Alert and oriented to person place and time. Psychiatric: Normal mood and affect. Behavior is normal.   ASSESSMENT AND PLAN: 76 year old male here for reassessment of the following:  Hepatocellular carcinoma IBS-D Protuberant abdomen  As above, history of hep C, there was a question of cirrhosis over time and we had been surveilling him for [facility].  He had a new liver mass noted in March, ended up getting a biopsy and ultimately surgical resection for confirmed [place].  No evidence of metastatic disease.  Fortunately on liver resection and there is no evidence of cirrhosis appreciated which is good news.  He will be surveyed closely by oncology and surgery.  He has scheduled MRI within 3 months.  Hopefully without underlying cirrhosis his risk for recurrence is lower than if he had cirrhosis.  He is recovering quite well from his surgery.  He will contact oncology to schedule follow-up visit.  Reviewed his bowel symptoms which are stable over time.  He had an extensive work-up for this and Lomotil as needed is working well for him.  We will refill this for him.  Otherwise we discussed his protuberant abdomen.  This was the initial reason for the abdominal imaging that was done.  He does not have ascites, there is no clear pathology on ultrasound or MRI of his abdomen that would cause this.  Initially I thought this may be due to intestinal gas and bloating, however  it is persistent, there all the time, does not get better at night or following bowel movements.  He does not pass any significant gas or belch, in this light I do not think intestinal gas is causing this.  Perhaps it is just a change in his abdominal wall over time.  I provided reassurance to him that I do not think anything concerning is causing this, however other than doing core exercises and strengthening his abdomen, no other specific recommendations for this.  He is understanding.  He can follow-up with me as needed, he understands oncology will be doing surveillance of his [place].  I spent 35 minutes of time, including in depth chart review, face-to-face time with the patient, and documentation.   Jolly Mango, MD [facility]

## 2021-12-20 NOTE — Patient Instructions (Addendum)
If you are age 75 or older, your body mass index should be between 23-30. Your Body mass index is 24.27 kg/m. If this is out of the aforementioned range listed, please consider follow up with your Primary Care Provider.  If you are age 72 or younger, your body mass index should be between 19-25. Your Body mass index is 24.27 kg/m. If this is out of the aformentioned range listed, please consider follow up with your Primary Care Provider.   ________________________________________________________  The [facility] providers would like to encourage you to use [facility] to communicate with providers for non-urgent requests or questions.  Due to long hold times on the telephone, sending your provider a message by [facility] may be a faster and more efficient way to get a response.  Please allow 48 business hours for a response.  Please remember that this is for non-urgent requests.  _______________________________________________________  We have sent the following medications to your pharmacy for you to pick up at your convenience: Lomotil  Thank you for entrusting me with your care and for choosing [facility], Dr. Attala Cellar

## 2021-12-20 NOTE — Telephone Encounter (Signed)
I called and spoke with the patient and he is going to call for a follow up.  I placed a recall as well. Nothing further needed.

## 2021-12-20 NOTE — Telephone Encounter (Signed)
Pt is calling in today to  request a refill on trelogy, and states he is currently out   [facility] on [phone]

## 2021-12-25 ENCOUNTER — Other Ambulatory Visit: Payer: Self-pay | Admitting: Gastroenterology

## 2021-12-26 ENCOUNTER — Ambulatory Visit: Payer: PPO | Admitting: Adult Health

## 2021-12-26 ENCOUNTER — Encounter: Payer: Self-pay | Admitting: Gastroenterology

## 2021-12-26 ENCOUNTER — Other Ambulatory Visit: Payer: Self-pay

## 2021-12-26 MED ORDER — DIPHENOXYLATE-ATROPINE 2.5-0.025 MG PO TABS: 1.0000 | ORAL_TABLET | Freq: Four times a day (QID) | ORAL | 1 refills | Status: DC | PRN

## 2021-12-26 NOTE — Progress Notes (Signed)
Script faxed to [facility].

## 2021-12-28 DIAGNOSIS — J441 Chronic obstructive pulmonary disease with (acute) exacerbation: Secondary | ICD-10-CM | POA: Diagnosis not present

## 2021-12-30 ENCOUNTER — Encounter: Payer: Self-pay | Admitting: Adult Health

## 2021-12-30 ENCOUNTER — Ambulatory Visit: Payer: PPO | Admitting: Adult Health

## 2021-12-30 DIAGNOSIS — J449 Chronic obstructive pulmonary disease, unspecified: Secondary | ICD-10-CM

## 2021-12-30 NOTE — Assessment & Plan Note (Signed)
COPD appears to be stable.  Continue on triple therapy maintenance inhaler.  Continue with activity as tolerated  Plan  Patient Instructions  Continue on TRELEGY 1 puff daily , rinse after use.  Albuterol inhaler or neb as needed  Activity as tolerated.  Follow up in 6 months with Dr. Lamonte Sakai  and As needed

## 2021-12-30 NOTE — Patient Instructions (Addendum)
Continue on TRELEGY 1 puff daily , rinse after use.  Albuterol inhaler or neb as needed  Activity as tolerated.  Follow up in 6 months with Dr. Lamonte Sakai  and As needed

## 2021-12-30 NOTE — Progress Notes (Signed)
$'@Patient'Y$  ID: George Watson, male    DOB: [birth year], 76 y.o.   MRN: [id number]  Chief Complaint  Patient presents with   Follow-up    Referring provider: de [place], Blondell Reveal, MD  HPI: 76 yo male former smoker seen for pulmonary consult 12/28/19 to establish for COPD  Hx of TB , took meds for 1 year in 1990  Hx of polysubstance abuse with IV drug use, clean since age 14 , history of hepatitis C status post interferon and ribavirin, new hep C treatment 2014 at [facility] with eradication Hepatocellular carcinoma status post resection Nov 21, 2021  TEST/EVENTS :  July 2021 PFT showed FEV1 78%, ratio 77, FVC 75%.  No significant bronchodilator response.  Mid flow reversibility.  (Patient did take Trelegy inhaler prior to PFT) 2016 FEV1 53%. CT chest July 10, 2020 showed biapical pleural and parenchymal scarring and bullous changes. CT angio chest aorta December 2021 new nodular changes in the right upper lobe and left lower lobe CT chest August 12, 2021 3 shows pleural densities in the apices with no significant change.,  Few blebs in the apical region, similar linear densities in the bibasilar area suggestive of scarring  12/30/2021 Follow up : COPD  Patient returns for a 36-monthfollow-up.  Patient has underlying COPD.  Says overall he is doing well.  He denies any flare of his cough or wheezing.  Does get short of breath with heavy activity.  He remains on Trelegy inhaler daily.  Denies any increased albuterol use.  He denies any hemoptysis, chest pain orthopnea PND or increased leg swelling.  Patient was recently diagnosed with hepatocellular carcinoma status post laparoscopic partial left hepatectomy.  Patient says he has been doing well since surgery.  Is actually went back to exercising on his stationary bike.  Says he is feeling good.  Patient said he had COVID-19 earlier this year.  Took him a while to get over.  Has some lingering fatigue but seems to be getting better.  No Known  Allergies  Immunization History  Administered Date(s) Administered   Fluad Quad(high Dose 65+) 03/29/2019   Hepatitis A 02/18/1998, 08/21/1998   Influenza Split 04/01/2011, 05/18/2012   Influenza, High Dose Seasonal PF 04/23/2016, 03/17/2018, 04/10/2021   Influenza, Seasonal, Injecte, Preservative Fre 05/01/2017   Influenza,inj,Quad PF,6+ Mos 04/28/2013, 04/21/2014   Influenza-Unspecified 04/21/2015, 05/21/2020   PFIZER(Purple Top)SARS-COV-2 Vaccination 08/10/2019, 08/31/2019, 05/21/2020, 11/19/2020   Pneumococcal Conjugate-13 08/22/2014   Pneumococcal Polysaccharide-23 07/21/2002, 07/08/2016   Tdap 11/18/2005, 01/03/2015    Past Medical History:  Diagnosis Date   Aortic aneurysm (HCC)    Arthritis    BPH (benign prostatic hypertrophy)    Cataract 2015   Cirrhosis (HCC)    COPD (chronic obstructive pulmonary disease) (HCC)    DDD (degenerative disc disease), lumbosacral    Dyspnea    Elevated PSA    [place] of lung ([place]    Essential hypertension, benign    diet controlled   Fracture of shaft of clavicle    GERD (gastroesophageal reflux disease)    H/O drug abuse ([place]    multisubstance   Hepatitis B    Hepatitis C 10/1996   HSV-2 (herpes simplex virus 2) infection    Hx of biopsy 08/2004   liver   Hx of colonic polyps 2022   Hyperlipidemia    Inguinal hernia    right   Other and unspecified hyperlipidemia    [place]state cancer ([place]    Substance abuse ([place]  Tuberculosis     Tobacco History: Social History   Tobacco Use  Smoking Status Former   Packs/day: 1.50   Years: 43.00   Total pack years: 64.50   Types: Cigarettes   Quit date: 02/22/2003   Years since quitting: 18.8  Smokeless Tobacco Never   Counseling given: Not Answered   Outpatient Medications Prior to Visit  Medication Sig Dispense Refill   albuterol (PROVENTIL) (2.5 MG/3ML) 0.083% nebulizer solution Take 3 mLs (2.5 mg total) by nebulization every 6 (six) hours as needed for wheezing or  shortness of breath. 75 mL 5   albuterol (VENTOLIN HFA) 108 (90 Base) MCG/ACT inhaler INHALE 2 PUFFS INTO THE LUNGS EVERY 6 HOURS AS NEEDED FOR SHORTNESS OF BREATH 18 each 1   aspirin EC 81 MG EC tablet Take 1 tablet (81 mg total) by mouth daily.     B Complex Vitamins (B COMPLEX PO) Take 1 tablet by mouth daily.     cholecalciferol (VITAMIN D3) 25 MCG (1000 UT) tablet Take 1,000 Units by mouth daily.     Coenzyme Q10-Vitamin E 100-300 MG-UNIT CHEW Chew 1 tablet by mouth daily.     diazepam (VALIUM) 2 MG tablet Take 1-2 tabs 30 minutes before flight. 10 tablet 0   diphenoxylate-atropine (LOMOTIL) 2.5-0.025 MG tablet Take 1 tablet by mouth 4 (four) times daily as needed for diarrhea or loose stools. 60 tablet 1   docusate sodium (COLACE) 100 MG capsule Take 1 capsule (100 mg total) by mouth 2 (two) times daily. 10 capsule 0   dutasteride (AVODART) 0.5 MG capsule Take 0.5 mg by mouth daily.     famotidine (PEPCID) 20 MG tablet Take 20 mg by mouth every evening.      Fluticasone-Umeclidin-Vilant (TRELEGY ELLIPTA) 100-62.5-25 MCG/ACT AEPB TAKE 1 PUFF BY MOUTH EVERY DAY 60 each 0   glucosamine-chondroitin 500-400 MG tablet Take 3 tablets by mouth daily. (Patient taking differently: Take 2 tablets by mouth daily.)     hydrochlorothiazide (MICROZIDE) 12.5 MG capsule TAKE 1 CAPSULE BY MOUTH EVERY DAY 90 capsule 3   losartan (COZAAR) 100 MG tablet TAKE 1 TABLET BY MOUTH EVERY DAY 90 tablet 0   metaxalone (SKELAXIN) 800 MG tablet TAKE 1 TABLET (800 MG TOTAL) BY MOUTH 3 (THREE) TIMES DAILY AS NEEDED FOR MUSCLE SPASMS. 30 tablet 0   Multiple Vitamins-Minerals (CENTRUM SILVER PO) Take 1 tablet by mouth daily.     Omega-3 Fatty Acids (FISH OIL PO) Take 1 capsule by mouth daily.     rosuvastatin (CRESTOR) 5 MG tablet TAKE 1 TABLET (5 MG TOTAL) BY MOUTH DAILY. 90 tablet 0   tamsulosin (FLOMAX) 0.4 MG CAPS capsule Take 0.4 mg by mouth daily.      traMADol (ULTRAM) 50 MG tablet Take 1 tablet (50 mg total) by mouth  every 6 (six) hours as needed for severe pain. 20 tablet 0   TURMERIC CURCUMIN PO Take 2 capsules by mouth daily.     No facility-administered medications prior to visit.     Review of Systems:   Constitutional:   No  weight loss, night sweats,  Fevers, chills,  +fatigue, or  lassitude.  HEENT:   No headaches,  Difficulty swallowing,  Tooth/dental problems, or  Sore throat,                No sneezing, itching, ear ache, nasal congestion, post nasal drip,   CV:  No chest pain,  Orthopnea, PND, swelling in lower extremities, anasarca, dizziness, palpitations, syncope.  GI  No heartburn, indigestion, abdominal pain, nausea, vomiting, diarrhea, change in bowel habits, loss of appetite, bloody stools.   Resp: .  No chest wall deformity  Skin: no rash or lesions.  GU: no dysuria, change in color of urine, no urgency or frequency.  No flank pain, no hematuria   MS:  No joint pain or swelling.  No decreased range of motion.  No back pain.    Physical Exam  BP 128/70 (BP Location: Left Arm, Patient Position: Sitting, Cuff Size: Normal)   Pulse 81   Temp 98.1 F (36.7 C) (Oral)   Ht '5\' 11"'$  (1.803 m)   Wt 174 lb 9.6 oz (79.2 kg)   SpO2 95%   BMI 24.35 kg/m   GEN: A/Ox3; pleasant , NAD, well nourished    HEENT:  Bear/AT,  EACs-clear, TMs-wnl, NOSE-clear, THROAT-clear, no lesions, no postnasal drip or exudate noted.   NECK:  Supple w/ fair ROM; no JVD; normal carotid impulses w/o bruits; no thyromegaly or nodules palpated; no lymphadenopathy.    RESP  Clear  P & A; w/o, wheezes/ rales/ or rhonchi. no accessory muscle use, no dullness to percussion  CARD:  RRR, no m/r/g, no peripheral edema, pulses intact, no cyanosis or clubbing.  GI:   Soft & nt; nml bowel sounds; no organomegaly or masses detected.   Musco: Warm bil, no deformities or joint swelling noted.   Neuro: alert, no focal deficits noted.    Skin: Warm, no lesions or rashes    Lab  Results:  CBC   BMET    Imaging: No results found.       Latest Ref Rng & Units 02/13/2020   10:55 AM  PFT Results  FVC-Pre L 3.44   FVC-Predicted Pre % 73   FVC-Post L 3.54   FVC-Predicted Post % 75   Pre FEV1/FVC % % 76   Post FEV1/FCV % % 77   FEV1-Pre L 2.63   FEV1-Predicted Pre % 76   FEV1-Post L 2.72   DLCO uncorrected ml/min/mmHg 25.56   DLCO UNC% % 93   DLCO corrected ml/min/mmHg 25.56   DLCO COR %Predicted % 93   DLVA Predicted % 109   TLC L 6.11   TLC % Predicted % 82   RV % Predicted % 99     No results found for: "NITRICOXIDE"      Assessment & Plan:   COPD (chronic obstructive pulmonary disease) COPD appears to be stable.  Continue on triple therapy maintenance inhaler.  Continue with activity as tolerated  Plan  Patient Instructions  Continue on TRELEGY 1 puff daily , rinse after use.  Albuterol inhaler or neb as needed  Activity as tolerated.  Follow up in 6 months with Dr. Lamonte Sakai  and As needed         Rexene Edison, NP 12/30/2021

## 2022-01-08 ENCOUNTER — Other Ambulatory Visit (HOSPITAL_BASED_OUTPATIENT_CLINIC_OR_DEPARTMENT_OTHER): Payer: Self-pay | Admitting: Family Medicine

## 2022-01-08 ENCOUNTER — Encounter (HOSPITAL_BASED_OUTPATIENT_CLINIC_OR_DEPARTMENT_OTHER): Payer: Self-pay | Admitting: Family Medicine

## 2022-01-08 ENCOUNTER — Ambulatory Visit (INDEPENDENT_AMBULATORY_CARE_PROVIDER_SITE_OTHER): Payer: PPO | Admitting: Family Medicine

## 2022-01-08 ENCOUNTER — Other Ambulatory Visit (HOSPITAL_BASED_OUTPATIENT_CLINIC_OR_DEPARTMENT_OTHER): Payer: Self-pay | Admitting: Nurse Practitioner

## 2022-01-08 VITALS — BP 127/79 | HR 68 | Ht 71.0 in | Wt 173.7 lb

## 2022-01-08 DIAGNOSIS — I10 Essential (primary) hypertension: Secondary | ICD-10-CM

## 2022-01-08 DIAGNOSIS — R7303 Prediabetes: Secondary | ICD-10-CM

## 2022-01-08 DIAGNOSIS — E785 Hyperlipidemia, unspecified: Secondary | ICD-10-CM | POA: Diagnosis not present

## 2022-01-08 DIAGNOSIS — J418 Mixed simple and mucopurulent chronic bronchitis: Secondary | ICD-10-CM

## 2022-01-08 MED ORDER — FLUTICASONE PROPIONATE 50 MCG/ACT NA SUSP: 2.0000 | Freq: Every day | NASAL | 6 refills | Status: DC

## 2022-01-08 NOTE — Assessment & Plan Note (Signed)
Has been tolerating rosuvastatin, denies any issues with myalgias Can continue with this medication. We will plan to check lipid panel tomorrow with fasting blood work

## 2022-01-08 NOTE — Assessment & Plan Note (Signed)
Blood pressure today is at goal.  He continues with hydrochlorothiazide and losartan.  Also continues follow-up with cardiology regarding hypertension as well as aortic root dilation.  He has been having some intermittent dizziness over the last few months.  Not aware of any specific aggravating factors.  He does not check his blood pressure regularly at home, has not checked during these episodes of dizziness.  Denies any syncopal episodes, no falls. On exam today, patient is in no acute distress, vital signs are stable.  Cardiopulmonary exam is reassuring, auscultation with regular rate and rhythm. Recommend for intermittent monitoring of blood pressure at home and maintaining blood pressure log.  Also recommend checking blood pressure during episodes of dizziness Discussed that if symptoms do continue or there is worsening, would recommend follow-up with cardiology given his cardiac history

## 2022-01-08 NOTE — Assessment & Plan Note (Addendum)
Most recent A1c remained stable.  Patient continues with lifestyle modifications regarding management We will plan to check A1c with fasting blood work tomorrow

## 2022-01-08 NOTE — Progress Notes (Signed)
    Procedures performed today:    None.  Independent interpretation of notes and tests performed by another provider:   None.  Brief History, Exam, Impression, and Recommendations:    BP 127/79   Pulse 68   Ht '5\' 11"'$  (1.803 m)   Wt 173 lb 11.2 oz (78.8 kg)   SpO2 98%   BMI 24.23 kg/m   Essential hypertension, benign Blood pressure today is at goal.  He continues with hydrochlorothiazide and losartan.  Also continues follow-up with cardiology regarding hypertension as well as aortic root dilation.  He has been having some intermittent dizziness over the last few months.  Not aware of any specific aggravating factors.  He does not check his blood pressure regularly at home, has not checked during these episodes of dizziness.  Denies any syncopal episodes, no falls. On exam today, patient is in no acute distress, vital signs are stable.  Cardiopulmonary exam is reassuring, auscultation with regular rate and rhythm. Recommend for intermittent monitoring of blood pressure at home and maintaining blood pressure log.  Also recommend checking blood pressure during episodes of dizziness Discussed that if symptoms do continue or there is worsening, would recommend follow-up with cardiology given his cardiac history  Prediabetes Most recent A1c remained stable.  Patient continues with lifestyle modifications regarding management We will plan to check A1c with fasting blood work tomorrow  Hyperlipidemia with target LDL less than 70 Has been tolerating rosuvastatin, denies any issues with myalgias Can continue with this medication. We will plan to check lipid panel tomorrow with fasting blood work  Return in about 3 months (around 04/10/2022) for HLD, HTN.   ___________________________________________ Davanna He de [place], MD, ABFM, CAQSM [facility] [place]

## 2022-01-09 ENCOUNTER — Ambulatory Visit (HOSPITAL_BASED_OUTPATIENT_CLINIC_OR_DEPARTMENT_OTHER): Payer: PPO

## 2022-01-09 DIAGNOSIS — C220 Liver cell carcinoma: Secondary | ICD-10-CM

## 2022-01-09 DIAGNOSIS — E785 Hyperlipidemia, unspecified: Secondary | ICD-10-CM

## 2022-01-09 DIAGNOSIS — R7303 Prediabetes: Secondary | ICD-10-CM

## 2022-01-09 DIAGNOSIS — I77810 Thoracic aortic ectasia: Secondary | ICD-10-CM

## 2022-01-10 LAB — LIPID PANEL
Chol/HDL Ratio: 4.8 ratio (ref 0.0–5.0)
Cholesterol, Total: 152 mg/dL (ref 100–199)
HDL: 32 mg/dL — ABNORMAL LOW (ref >39)
LDL Chol Calc (NIH): 92 mg/dL (ref 0–99)
Triglycerides: 159 mg/dL — ABNORMAL HIGH (ref 0–149)
VLDL Cholesterol Cal: 28 mg/dL (ref 5–40)

## 2022-01-10 LAB — HEMOGLOBIN A1C
Est. average glucose Bld gHb Est-mCnc: 126 mg/dL
Hgb A1c MFr Bld: 6 % — ABNORMAL HIGH (ref 4.8–5.6)

## 2022-01-12 ENCOUNTER — Telehealth: Payer: Self-pay | Admitting: Emergency Medicine

## 2022-01-12 ENCOUNTER — Inpatient Hospital Stay: Admission: RE | Admit: 2022-01-12 | Payer: Self-pay | Source: Ambulatory Visit

## 2022-01-12 ENCOUNTER — Ambulatory Visit (HOSPITAL_COMMUNITY)
Admission: EM | Admit: 2022-01-12 | Discharge: 2022-01-12 | Disposition: A | Discharge status: Home / Self Care | Payer: PPO | Attending: Emergency Medicine | Admitting: Emergency Medicine

## 2022-01-12 DIAGNOSIS — J441 Chronic obstructive pulmonary disease with (acute) exacerbation: Secondary | ICD-10-CM

## 2022-01-12 MED ORDER — PREDNISONE 10 MG (21) PO TBPK: ORAL_TABLET | Freq: Every day | ORAL | 0 refills | Status: DC

## 2022-01-12 MED ORDER — AZITHROMYCIN 250 MG PO TABS: 250.0000 mg | ORAL_TABLET | Freq: Every day | ORAL | 0 refills | Status: DC

## 2022-01-12 NOTE — ED Provider Notes (Signed)
[facility]    CSN: [id number] Arrival date & time: 01/12/22  1409      History   Chief Complaint Chief Complaint  Patient presents with   Cough    HPI George Watson is a 76 y.o. male.   Patient presents with a productive cough, shortness of breath at rest and wheezing for 1 day.  Endorses that he works outside in the change in the weather most likely has triggered his COPD.  Has attempted use of inhalers which have been ineffective.  Denies fever, chills, URI symptoms.    Past Medical History:  Diagnosis Date   Aortic aneurysm (HCC)    Arthritis    BPH (benign prostatic hypertrophy)    Cataract 2015   Cirrhosis (HCC)    COPD (chronic obstructive pulmonary disease) (HCC)    DDD (degenerative disc disease), lumbosacral    Dyspnea    Elevated PSA    Emphysema of lung (HCC)    Essential hypertension, benign    diet controlled   Fracture of shaft of clavicle    GERD (gastroesophageal reflux disease)    H/O drug abuse ([place])    multisubstance   Hepatitis B    Hepatitis C 10/1996   HSV-2 (herpes simplex virus 2) infection    Hx of biopsy 08/2004   liver   Hx of colonic polyps 2022   Hyperlipidemia    Inguinal hernia    right   Other and unspecified hyperlipidemia    Prostate cancer ([place])    Substance abuse ([place])    Tuberculosis     Patient Active Problem List   Diagnosis Date Noted   Hepatocellular carcinoma ([place]) 10/14/2021   Bloating 09/17/2021   COVID-19 08/13/2021   Lung nodule 10/05/2020   Gastroenteritis 05/08/2020   TIA (transient ischemic attack) 07/07/2019   Dilated aortic root ([place]) 06/13/2019   Pure hypertriglyceridemia 04/08/2019   Fear of flying 02/11/2018   Tear of right rotator cuff 08/31/2017   Wellness examination 01/07/2017   Prediabetes 01/06/2017   Atherosclerosis of aorta ([place]) 07/09/2016   Degeneration of lumbar or lumbosacral intervertebral disc 02/15/2014   Essential hypertension, benign    Hyperlipidemia with target  LDL less than 70    HSV-2 (herpes simplex virus 2) infection    COPD (chronic obstructive pulmonary disease) (HCC)    Benign prostatic hyperplasia    DDD (degenerative disc disease), lumbosacral    Prostate cancer ([place])    Cirrhosis ([place]) 05/21/1997   Hepatitis C, chronic ([place]) 10/19/1996    Past Surgical History:  Procedure Laterality Date   BACK SURGERY     CERVICAL SPINE SURGERY     EYE SURGERY  3/20   fracture ribs     3   HERNIA REPAIR     INGUINAL HERNIA REPAIR  07/01/2012   Procedure: HERNIA REPAIR INGUINAL ADULT;  Surgeon: Madilyn Hook, DO;  Location: [facility];  Service: General;  Laterality: Right;  with Mesh   LAPAROSCOPIC LIVER ULTRASOUND N/A 11/21/2021   Procedure: LAPAROSCOPIC LIVER ULTRASOUND;  Surgeon: Dwan Bolt, MD;  Location: [facility];  Service: General;  Laterality: N/A;   LAPAROSCOPIC PARTIAL HEPATECTOMY Left 11/21/2021   Procedure: LAPAROSCOPIC PARTIAL HEPATECTOMY;  Surgeon: Dwan Bolt, MD;  Location: [facility];  Service: General;  Laterality: Left;   LAPAROSCOPY N/A 11/21/2021   Procedure: STAGING LAPAROSCOPY;  Surgeon: Dwan Bolt, MD;  Location: [facility];  Service: General;  Laterality: N/A;   left knee meniscus repair  07/21/1990  right rotator cuff  07/21/1990   right shoulder arthroscopy  07/21/2009   SPINE SURGERY  10/14       Home Medications    Prior to Admission medications   Medication Sig Start Date End Date Taking? Authorizing Provider  albuterol (PROVENTIL) (2.5 MG/3ML) 0.083% nebulizer solution Take 3 mLs (2.5 mg total) by nebulization every 6 (six) hours as needed for wheezing or shortness of breath. 05/29/21 05/29/22  Parrett, Fonnie Mu, NP  albuterol (VENTOLIN HFA) 108 (90 Base) MCG/ACT inhaler INHALE 2 PUFFS INTO THE LUNGS EVERY 6 HOURS AS NEEDED FOR SHORTNESS OF BREATH 01/09/22   de [place], Blondell Reveal, MD  aspirin EC 81 MG EC tablet Take 1 tablet (81 mg total) by mouth daily. 07/09/19   Black, Lezlie Octave, NP  B Complex Vitamins (B COMPLEX PO) Take  1 tablet by mouth daily.    [provider]  cholecalciferol (VITAMIN D3) 25 MCG (1000 UT) tablet Take 1,000 Units by mouth daily.    [provider]  Coenzyme Q10-Vitamin E 100-300 MG-UNIT CHEW Chew 1 tablet by mouth daily.    [provider]  diazepam (VALIUM) 2 MG tablet Take 1-2 tabs 30 minutes before flight. 07/23/21   de [place], Raymond J, MD  diphenoxylate-atropine (LOMOTIL) 2.5-0.025 MG tablet Take 1 tablet by mouth 4 (four) times daily as needed for diarrhea or loose stools. 12/26/21   Armbruster, Carlota Raspberry, MD  docusate sodium (COLACE) 100 MG capsule Take 1 capsule (100 mg total) by mouth 2 (two) times daily. 11/22/21   Dwan Bolt, MD  dutasteride (AVODART) 0.5 MG capsule Take 0.5 mg by mouth daily.    [provider]  famotidine (PEPCID) 20 MG tablet Take 20 mg by mouth every evening.     [provider]  fluticasone (FLONASE) 50 MCG/ACT nasal spray Place 2 sprays into both nostrils daily. 01/08/22   de [place], Raymond J, MD  Fluticasone-Umeclidin-Vilant (TRELEGY ELLIPTA) 100-62.5-25 MCG/ACT AEPB TAKE 1 PUFF BY MOUTH EVERY DAY 12/20/21   Parrett, Fonnie Mu, NP  glucosamine-chondroitin 500-400 MG tablet Take 3 tablets by mouth daily. Patient taking differently: Take 2 tablets by mouth daily. 02/15/18   Marrian Salvage, FNP  hydrochlorothiazide (MICROZIDE) 12.5 MG capsule TAKE 1 CAPSULE BY MOUTH EVERY DAY 11/08/21   de [place], Blondell Reveal, MD  losartan (COZAAR) 100 MG tablet TAKE 1 TABLET BY MOUTH EVERY DAY 10/31/21   de [place], Blondell Reveal, MD  metaxalone (SKELAXIN) 800 MG tablet TAKE 1 TABLET (800 MG TOTAL) BY MOUTH 3 (THREE) TIMES DAILY AS NEEDED FOR MUSCLE SPASMS. 09/23/21   de [place], Blondell Reveal, MD  Multiple Vitamins-Minerals (CENTRUM SILVER PO) Take 1 tablet by mouth daily.    [provider]  Omega-3 Fatty Acids (FISH OIL PO) Take 1 capsule by mouth daily.    [provider]  rosuvastatin (CRESTOR) 5 MG tablet TAKE 1 TABLET (5 MG TOTAL) BY  MOUTH DAILY. 01/09/22   [place], Blondell Reveal, MD  tamsulosin (FLOMAX) 0.4 MG CAPS capsule Take 0.4 mg by mouth daily.     [provider]  traMADol (ULTRAM) 50 MG tablet Take 1 tablet (50 mg total) by mouth every 6 (six) hours as needed for severe pain. 11/22/21   Dwan Bolt, MD  TURMERIC CURCUMIN PO Take 2 capsules by mouth daily.    [provider]    Family History Family History  Problem Relation Age of Onset   Leukemia Mother    Throat cancer  Father    Esophageal cancer Father    Cancer Father        esophageal cancer   Breast cancer Sister    Cancer Sister    Prostate cancer Brother    Melanoma Brother    Colon cancer Neg Hx    Stomach cancer Neg Hx    Pancreatic cancer Neg Hx    Liver disease Neg Hx     Social History Social History   Tobacco Use   Smoking status: Former    Packs/day: 1.50    Years: 43.00    Total pack years: 64.50    Types: Cigarettes    Quit date: 02/22/2003    Years since quitting: 18.9   Smokeless tobacco: Never  Vaping Use   Vaping Use: Never used  Substance Use Topics   Alcohol use: Not Currently    Comment: 1988   Drug use: Not Currently    Types: Cocaine, Heroin    Comment: 26 years ago cocaine, heroin. methadone     Allergies   Patient has no known allergies.   Review of Systems Review of Systems  Constitutional: Negative.   HENT: Negative.    Respiratory:  Positive for cough, shortness of breath and wheezing. Negative for apnea, choking, chest tightness and stridor.   Cardiovascular: Negative.   Skin: Negative.   Neurological: Negative.      Physical Exam Triage Vital Signs ED Triage Vitals  Enc Vitals Group     BP 01/12/22 1455 130/73     Pulse Rate 01/12/22 1454 72     Resp 01/12/22 1454 18     Temp 01/12/22 1454 98.3 F (36.8 C)     Temp Source 01/12/22 1454 Oral     SpO2 01/12/22 1454 99 %     Weight --      Height --      Head Circumference --      Peak Flow --      Pain Score --       Pain Loc --      Pain Edu? --      Excl. in [place]? --    No data found.  Updated Vital Signs BP 130/73 (BP Location: Left Arm)   Pulse 72   Temp 98.3 F (36.8 C) (Oral)   Resp 18   SpO2 99%   Visual Acuity Right Eye Distance:   Left Eye Distance:   Bilateral Distance:    Right Eye Near:   Left Eye Near:    Bilateral Near:     Physical Exam Constitutional:      Appearance: Normal appearance.  HENT:     Head: Normocephalic.  Eyes:     Extraocular Movements: Extraocular movements intact.  Cardiovascular:     Rate and Rhythm: Normal rate and regular rhythm.     Pulses: Normal pulses.     Heart sounds: Normal heart sounds.  Pulmonary:     Comments: Mild wheezing noted to the bilateral lower lobes are diminished but clear pulmonary effort is normal  Skin:    General: Skin is warm and dry.  Neurological:     Mental Status: He is alert and oriented to person, place, and time. Mental status is at baseline.  Psychiatric:        Mood and Affect: Mood normal.        Behavior: Behavior normal.      UC Treatments / Results  Labs (all labs ordered are listed, but only abnormal results  are displayed) Labs Reviewed - No data to display  EKG   Radiology No results found.  Procedures Procedures (including critical care time)  Medications Ordered in UC Medications - No data to display  Initial Impression / Assessment and Plan / UC Course  I have reviewed the triage vital signs and the nursing notes.  Pertinent labs & imaging results that were available during my care of the patient were reviewed by me and considered in my medical decision making (see chart for details).  COPD exacerbation  Vital signs are stable, O2 saturation 99% on room air, patient is in no signs of distress, stable for outpatient management, will move forward with treatment of a COPD exacerbation, low suspicion for infectious cause due to lack of accompanying symptoms, prescribe Z-Pak and  prednisone course for management, advised to continue use of inhalers as prescribed, declined medications for coughing, may use over-the-counter medications as needed for supportive care, may follow-up with this urgent care as needed Final Clinical Impressions(s) / UC Diagnoses   Final diagnoses:  None   Discharge Instructions   None    ED Prescriptions   None    PDMP not reviewed this encounter.   Hans Eden, [place] 01/15/22 [phone]

## 2022-01-17 ENCOUNTER — Other Ambulatory Visit: Payer: Self-pay | Admitting: Adult Health

## 2022-01-20 DIAGNOSIS — Z8546 Personal history of malignant neoplasm of prostate: Secondary | ICD-10-CM | POA: Diagnosis not present

## 2022-01-27 DIAGNOSIS — R3915 Urgency of urination: Secondary | ICD-10-CM | POA: Diagnosis not present

## 2022-01-27 DIAGNOSIS — N401 Enlarged prostate with lower urinary tract symptoms: Secondary | ICD-10-CM | POA: Diagnosis not present

## 2022-01-27 DIAGNOSIS — Z8546 Personal history of malignant neoplasm of prostate: Secondary | ICD-10-CM | POA: Diagnosis not present

## 2022-01-27 DIAGNOSIS — J441 Chronic obstructive pulmonary disease with (acute) exacerbation: Secondary | ICD-10-CM | POA: Diagnosis not present

## 2022-01-28 ENCOUNTER — Encounter (HOSPITAL_BASED_OUTPATIENT_CLINIC_OR_DEPARTMENT_OTHER): Payer: Self-pay | Admitting: Family Medicine

## 2022-01-31 NOTE — Telephone Encounter (Signed)
Called pt and lvm to schedule virtual visit.

## 2022-02-01 ENCOUNTER — Other Ambulatory Visit (HOSPITAL_BASED_OUTPATIENT_CLINIC_OR_DEPARTMENT_OTHER): Payer: Self-pay | Admitting: Family Medicine

## 2022-02-01 DIAGNOSIS — I10 Essential (primary) hypertension: Secondary | ICD-10-CM

## 2022-02-06 ENCOUNTER — Encounter (HOSPITAL_BASED_OUTPATIENT_CLINIC_OR_DEPARTMENT_OTHER): Payer: Self-pay | Admitting: Family Medicine

## 2022-02-06 ENCOUNTER — Ambulatory Visit (INDEPENDENT_AMBULATORY_CARE_PROVIDER_SITE_OTHER): Payer: PPO | Admitting: Family Medicine

## 2022-02-06 DIAGNOSIS — T148XXA Other injury of unspecified body region, initial encounter: Secondary | ICD-10-CM

## 2022-02-06 NOTE — Progress Notes (Signed)
   Virtual Visit via Telephone   I connected with  George Watson  on 02/06/22 by telephone/telehealth and verified that I am speaking with the correct person using two identifiers.   I discussed the limitations, risks, security and privacy concerns of performing an evaluation and management service by telephone, including the higher likelihood of inaccurate diagnosis and treatment, and the availability of in person appointments.  We also discussed the likely need of an additional face to face encounter for complete and high quality delivery of care.  I also discussed with the patient that there may be a patient responsible charge related to this service. The patient expressed understanding and wishes to proceed.  Provider location is in medical facility. Patient location is at their home, different from provider location. People involved in care of the patient during this telehealth encounter were myself, my nurse/medical assistant, and my front office/scheduling team member.  Review of Systems: No fevers, chills, night sweats, weight loss, chest pain, or shortness of breath.   Objective Findings:    General: Speaking full sentences, no audible heavy breathing.  Sounds alert and appropriately interactive.    Independent interpretation of tests performed by another provider:   None.  Brief History, Exam, Impression, and Recommendations:    Bruising Patient reports having some bruising primarily over his forearms as well as intermittent bleeding if he bumps his arms.  Denies significant involvement of any other sites.  Primary question is pertaining to role of collagen in treatment of symptoms.  He indicates that he did some searching online and saw that over-the-counter collagen may be an option for him He denies any issues with bleeding otherwise, no nosebleeds, bleeding gums, blood in the stool or urine. Based on patient's description, it seems that symptoms most likely are related to  senile purpura.  Discussed etiology of symptoms.  Discussed that if he wanted to try collagen for a period of time such as 2 months and monitor for any improvement, could consider doing so.  Discussed my skepticism that it would lead to any noticeable improvement for him.  Did discuss that if utilizing over-the-counter supplements, to try to identify those which have been tested and validated by third-party companies We will plan for follow-up at previously scheduled appointment  I discussed the above assessment and treatment plan with the patient. The patient was provided an opportunity to ask questions and all were answered. The patient agreed with the plan and demonstrated an understanding of the instructions.  The patient was advised to call back or seek an in-person evaluation if the symptoms worsen or if the condition fails to improve as anticipated.  I provided 5 minutes of face to face and non-face-to-face time during this encounter date, time was needed to gather information, review chart, records, communicate/coordinate with staff remotely, as well as complete documentation.   ___________________________________________ Raychelle Hudman de [place], MD, ABFM, [facility] Primary [facility] and [place]

## 2022-02-06 NOTE — Assessment & Plan Note (Signed)
Patient reports having some bruising primarily over his forearms as well as intermittent bleeding if he bumps his arms.  Denies significant involvement of any other sites.  Primary question is pertaining to role of collagen in treatment of symptoms.  He indicates that he did some searching online and saw that over-the-counter collagen may be an option for him He denies any issues with bleeding otherwise, no nosebleeds, bleeding gums, blood in the stool or urine. Based on patient's description, it seems that symptoms most likely are related to senile purpura.  Discussed etiology of symptoms.  Discussed that if he wanted to try collagen for a period of time such as 2 months and monitor for any improvement, could consider doing so.  Discussed my skepticism that it would lead to any noticeable improvement for him.  Did discuss that if utilizing over-the-counter supplements, to try to identify those which have been tested and validated by third-party companies We will plan for follow-up at previously scheduled appointment

## 2022-02-24 DIAGNOSIS — Z87891 Personal history of nicotine dependence: Secondary | ICD-10-CM | POA: Diagnosis not present

## 2022-02-24 DIAGNOSIS — D692 Other nonthrombocytopenic purpura: Secondary | ICD-10-CM | POA: Diagnosis not present

## 2022-02-24 DIAGNOSIS — C61 Malignant neoplasm of prostate: Secondary | ICD-10-CM | POA: Diagnosis not present

## 2022-02-24 DIAGNOSIS — J449 Chronic obstructive pulmonary disease, unspecified: Secondary | ICD-10-CM | POA: Diagnosis not present

## 2022-02-24 DIAGNOSIS — I719 Aortic aneurysm of unspecified site, without rupture: Secondary | ICD-10-CM | POA: Diagnosis not present

## 2022-02-24 DIAGNOSIS — Z6823 Body mass index (BMI) 23.0-23.9, adult: Secondary | ICD-10-CM | POA: Diagnosis not present

## 2022-02-26 DIAGNOSIS — S39012A Strain of muscle, fascia and tendon of lower back, initial encounter: Secondary | ICD-10-CM | POA: Diagnosis not present

## 2022-02-26 DIAGNOSIS — M9902 Segmental and somatic dysfunction of thoracic region: Secondary | ICD-10-CM | POA: Diagnosis not present

## 2022-02-26 DIAGNOSIS — M47812 Spondylosis without myelopathy or radiculopathy, cervical region: Secondary | ICD-10-CM | POA: Diagnosis not present

## 2022-02-26 DIAGNOSIS — M9901 Segmental and somatic dysfunction of cervical region: Secondary | ICD-10-CM | POA: Diagnosis not present

## 2022-02-26 DIAGNOSIS — M9903 Segmental and somatic dysfunction of lumbar region: Secondary | ICD-10-CM | POA: Diagnosis not present

## 2022-02-26 DIAGNOSIS — M47817 Spondylosis without myelopathy or radiculopathy, lumbosacral region: Secondary | ICD-10-CM | POA: Diagnosis not present

## 2022-02-27 DIAGNOSIS — J441 Chronic obstructive pulmonary disease with (acute) exacerbation: Secondary | ICD-10-CM | POA: Diagnosis not present

## 2022-02-28 ENCOUNTER — Other Ambulatory Visit: Payer: Self-pay | Admitting: Surgery

## 2022-02-28 DIAGNOSIS — C220 Liver cell carcinoma: Secondary | ICD-10-CM

## 2022-03-05 DIAGNOSIS — R160 Hepatomegaly, not elsewhere classified: Secondary | ICD-10-CM | POA: Diagnosis not present

## 2022-03-05 DIAGNOSIS — C220 Liver cell carcinoma: Secondary | ICD-10-CM | POA: Diagnosis not present

## 2022-03-06 DIAGNOSIS — L570 Actinic keratosis: Secondary | ICD-10-CM | POA: Diagnosis not present

## 2022-03-06 DIAGNOSIS — L814 Other melanin hyperpigmentation: Secondary | ICD-10-CM | POA: Diagnosis not present

## 2022-03-06 DIAGNOSIS — C220 Liver cell carcinoma: Secondary | ICD-10-CM | POA: Diagnosis not present

## 2022-03-06 DIAGNOSIS — L821 Other seborrheic keratosis: Secondary | ICD-10-CM | POA: Diagnosis not present

## 2022-03-06 DIAGNOSIS — D1801 Hemangioma of skin and subcutaneous tissue: Secondary | ICD-10-CM | POA: Diagnosis not present

## 2022-03-06 DIAGNOSIS — L309 Dermatitis, unspecified: Secondary | ICD-10-CM | POA: Diagnosis not present

## 2022-03-06 DIAGNOSIS — L719 Rosacea, unspecified: Secondary | ICD-10-CM | POA: Diagnosis not present

## 2022-03-06 DIAGNOSIS — L578 Other skin changes due to chronic exposure to nonionizing radiation: Secondary | ICD-10-CM | POA: Diagnosis not present

## 2022-03-14 ENCOUNTER — Other Ambulatory Visit: Payer: PPO

## 2022-03-17 ENCOUNTER — Ambulatory Visit
Admission: RE | Admit: 2022-03-17 | Discharge: 2022-03-17 | Disposition: A | Discharge status: Home / Self Care | Payer: PPO | Source: Ambulatory Visit | Attending: Surgery | Admitting: Surgery

## 2022-03-17 DIAGNOSIS — C220 Liver cell carcinoma: Secondary | ICD-10-CM

## 2022-03-19 ENCOUNTER — Encounter (HOSPITAL_COMMUNITY): Payer: Self-pay

## 2022-03-19 ENCOUNTER — Ambulatory Visit (HOSPITAL_COMMUNITY)
Admission: EM | Admit: 2022-03-19 | Discharge: 2022-03-19 | Disposition: A | Discharge status: Home / Self Care | Payer: PPO | Attending: Nurse Practitioner | Admitting: Nurse Practitioner

## 2022-03-19 DIAGNOSIS — J439 Emphysema, unspecified: Secondary | ICD-10-CM | POA: Diagnosis not present

## 2022-03-19 DIAGNOSIS — R062 Wheezing: Secondary | ICD-10-CM

## 2022-03-19 MED ORDER — PREDNISONE 10 MG (21) PO TBPK: ORAL_TABLET | Freq: Every day | ORAL | 0 refills | Status: DC

## 2022-03-19 NOTE — ED Provider Notes (Signed)
[facility]    CSN: [id number] Arrival date & time: 03/19/22  1941      History   Chief Complaint Chief Complaint  Patient presents with   Wheezing    HPI George Watson is a 76 y.o. male.   HPI  He is complaining of shortness of breath, wheezing with chest tightness. He has emphysema and feels like he is having a flare up. This has been going on for 1 day. He has used his Trelegy and albuterol inhaler with no relief.  He has not used the Albuterol nebulizer today. He denies fever, chills, nasal congestion, sneezing, runny nose, cough, new sore throat, new loss of smell or taste, chest pain, nausea, or diarrhea. Denies any  Exposure positive/negative COVID/Influenza/Strep. He reports flare ups every 3 months and he needs a steroid taper to help with symptoms.  Past Medical History:  Diagnosis Date   Aortic aneurysm (HCC)    Arthritis    BPH (benign prostatic hypertrophy)    Cataract 2015   Cirrhosis (HCC)    COPD (chronic obstructive pulmonary disease) (HCC)    DDD (degenerative disc disease), lumbosacral    Dyspnea    Elevated PSA    Emphysema of lung (HCC)    Essential hypertension, benign    diet controlled   Fracture of shaft of clavicle    GERD (gastroesophageal reflux disease)    H/O drug abuse ([place])    multisubstance   Hepatitis B    Hepatitis C 10/1996   HSV-2 (herpes simplex virus 2) infection    Hx of biopsy 08/2004   liver   Hx of colonic polyps 2022   Hyperlipidemia    Inguinal hernia    right   Other and unspecified hyperlipidemia    Prostate cancer ([place])    Substance abuse ([place])    Tuberculosis     Patient Active Problem List   Diagnosis Date Noted   Bruising 02/06/2022   Hepatocellular carcinoma ([place]) 10/14/2021   Bloating 09/17/2021   COVID-19 08/13/2021   Lung nodule 10/05/2020   Gastroenteritis 05/08/2020   TIA (transient ischemic attack) 07/07/2019   Dilated aortic root ([facility]) 06/13/2019   Pure hypertriglyceridemia  04/08/2019   Fear of flying 02/11/2018   Tear of right rotator cuff 08/31/2017   Wellness examination 01/07/2017   Prediabetes 01/06/2017   Atherosclerosis of aorta ([place]) 07/09/2016   Degeneration of lumbar or lumbosacral intervertebral disc 02/15/2014   Essential hypertension, benign    Hyperlipidemia with target LDL less than 70    HSV-2 (herpes simplex virus 2) infection    COPD (chronic obstructive pulmonary disease) (HCC)    Benign prostatic hyperplasia    DDD (degenerative disc disease), lumbosacral    Prostate cancer ([place])    Cirrhosis ([place]) 05/21/1997   Hepatitis C, chronic ([place]) 10/19/1996    Past Surgical History:  Procedure Laterality Date   BACK SURGERY     CERVICAL SPINE SURGERY     EYE SURGERY  3/20   fracture ribs     3   HERNIA REPAIR     INGUINAL HERNIA REPAIR  07/01/2012   Procedure: HERNIA REPAIR INGUINAL ADULT;  Surgeon: Madilyn Hook, DO;  Location: [facility];  Service: General;  Laterality: Right;  with Mesh   LAPAROSCOPIC LIVER ULTRASOUND N/A 11/21/2021   Procedure: LAPAROSCOPIC LIVER ULTRASOUND;  Surgeon: Dwan Bolt, MD;  Location: [facility];  Service: General;  Laterality: N/A;   LAPAROSCOPIC PARTIAL HEPATECTOMY Left 11/21/2021   Procedure: LAPAROSCOPIC  PARTIAL HEPATECTOMY;  Surgeon: Dwan Bolt, MD;  Location: [facility];  Service: General;  Laterality: Left;   LAPAROSCOPY N/A 11/21/2021   Procedure: STAGING LAPAROSCOPY;  Surgeon: Dwan Bolt, MD;  Location: [facility];  Service: General;  Laterality: N/A;   left knee meniscus repair  07/21/1990   right rotator cuff  07/21/1990   right shoulder arthroscopy  07/21/2009   SPINE SURGERY  10/14       Home Medications    Prior to Admission medications   Medication Sig Start Date End Date Taking? Authorizing Provider  predniSONE (STERAPRED UNI-PAK 21 TAB) 10 MG (21) TBPK tablet Take by mouth daily. Take 6 tabs by mouth daily  for 2 days, then 5 tabs for 2 days, then 4 tabs for 2 days, then 3 tabs for 2 days, 2  tabs for 2 days, then 1 tab by mouth daily for 2 days 03/19/22  Yes Vevelyn Francois, NP  albuterol (PROVENTIL) (2.5 MG/3ML) 0.083% nebulizer solution Take 3 mLs (2.5 mg total) by nebulization every 6 (six) hours as needed for wheezing or shortness of breath. 05/29/21 05/29/22  Parrett, Fonnie Mu, NP  albuterol (VENTOLIN HFA) 108 (90 Base) MCG/ACT inhaler INHALE 2 PUFFS INTO THE LUNGS EVERY 6 HOURS AS NEEDED FOR SHORTNESS OF BREATH 01/09/22   de [place], Blondell Reveal, MD  aspirin EC 81 MG EC tablet Take 1 tablet (81 mg total) by mouth daily. 07/09/19   Black, Lezlie Octave, NP  B Complex Vitamins (B COMPLEX PO) Take 1 tablet by mouth daily.    [provider]  cholecalciferol (VITAMIN D3) 25 MCG (1000 UT) tablet Take 1,000 Units by mouth daily.    [provider]  Coenzyme Q10-Vitamin E 100-300 MG-UNIT CHEW Chew 1 tablet by mouth daily.    [provider]  diazepam (VALIUM) 2 MG tablet Take 1-2 tabs 30 minutes before flight. 07/23/21   de [place], Raymond J, MD  diphenoxylate-atropine (LOMOTIL) 2.5-0.025 MG tablet Take 1 tablet by mouth 4 (four) times daily as needed for diarrhea or loose stools. 12/26/21   Armbruster, Carlota Raspberry, MD  docusate sodium (COLACE) 100 MG capsule Take 1 capsule (100 mg total) by mouth 2 (two) times daily. 11/22/21   Dwan Bolt, MD  dutasteride (AVODART) 0.5 MG capsule Take 0.5 mg by mouth daily.    [provider]  famotidine (PEPCID) 20 MG tablet Take 20 mg by mouth every evening.     [provider]  fluticasone (FLONASE) 50 MCG/ACT nasal spray Place 2 sprays into both nostrils daily. 01/08/22   [place], Blondell Reveal, MD  Fluticasone-Umeclidin-Vilant (TRELEGY ELLIPTA) 100-62.5-25 MCG/ACT AEPB INHALE 1 PUFF BY MOUTH EVERY DAY 01/20/22   Parrett, Fonnie Mu, NP  glucosamine-chondroitin 500-400 MG tablet Take 3 tablets by mouth daily. Patient taking differently: Take 2 tablets by mouth daily. 02/15/18   Marrian Salvage, FNP  hydrochlorothiazide (MICROZIDE)  12.5 MG capsule TAKE 1 CAPSULE BY MOUTH EVERY DAY 11/08/21   de [place], Blondell Reveal, MD  losartan (COZAAR) 100 MG tablet TAKE 1 TABLET BY MOUTH EVERY DAY 02/03/22   de [place], Blondell Reveal, MD  metaxalone (SKELAXIN) 800 MG tablet TAKE 1 TABLET (800 MG TOTAL) BY MOUTH 3 (THREE) TIMES DAILY AS NEEDED FOR MUSCLE SPASMS. 09/23/21   de [place], Blondell Reveal, MD  Multiple Vitamins-Minerals (CENTRUM SILVER PO) Take 1 tablet by mouth daily.    [provider]  Omega-3 Fatty Acids (FISH OIL PO) Take 1 capsule by mouth  daily.    [provider]  rosuvastatin (CRESTOR) 5 MG tablet TAKE 1 TABLET (5 MG TOTAL) BY MOUTH DAILY. 01/09/22   [place], Blondell Reveal, MD  tamsulosin (FLOMAX) 0.4 MG CAPS capsule Take 0.4 mg by mouth daily.     [provider]  traMADol (ULTRAM) 50 MG tablet Take 1 tablet (50 mg total) by mouth every 6 (six) hours as needed for severe pain. 11/22/21   Dwan Bolt, MD  TURMERIC CURCUMIN PO Take 2 capsules by mouth daily.    [provider]    Family History Family History  Problem Relation Age of Onset   Leukemia Mother    Throat cancer Father    Esophageal cancer Father    Cancer Father        esophageal cancer   Breast cancer Sister    Cancer Sister    Prostate cancer Brother    Melanoma Brother    Colon cancer Neg Hx    Stomach cancer Neg Hx    Pancreatic cancer Neg Hx    Liver disease Neg Hx     Social History Social History   Tobacco Use   Smoking status: Former    Packs/day: 1.50    Years: 43.00    Total pack years: 64.50    Types: Cigarettes    Quit date: 02/22/2003    Years since quitting: 19.0   Smokeless tobacco: Never  Vaping Use   Vaping Use: Never used  Substance Use Topics   Alcohol use: Not Currently    Comment: 1988   Drug use: Not Currently    Types: Cocaine, Heroin    Comment: 26 years ago cocaine, heroin. methadone     Allergies   Patient has no known allergies.   Review of Systems Review of Systems   Physical  Exam Triage Vital Signs ED Triage Vitals  Enc Vitals Group     BP 03/19/22 0912 (!) 141/82     Pulse Rate 03/19/22 0912 (!) 58     Resp 03/19/22 0912 18     Temp 03/19/22 0912 98.3 F (36.8 C)     Temp Source 03/19/22 0912 Oral     SpO2 03/19/22 0912 98 %     Weight --      Height --      Head Circumference --      Peak Flow --      Pain Score 03/19/22 0913 0     Pain Loc --      Pain Edu? --      Excl. in [place]? --    No data found.  Updated Vital Signs BP (!) 141/82 (BP Location: Right Arm)   Pulse (!) 58   Temp 98.3 F (36.8 C) (Oral)   Resp 18   SpO2 98%   Visual Acuity Right Eye Distance:   Left Eye Distance:   Bilateral Distance:    Right Eye Near:   Left Eye Near:    Bilateral Near:     Physical Exam Constitutional:      General: He is not in acute distress. HENT:     Head: Normocephalic and atraumatic.     Nose: Nose normal.     Mouth/Throat:     Mouth: Mucous membranes are moist.  Cardiovascular:     Rate and Rhythm: Normal rate.     Pulses: Normal pulses.  Pulmonary:     Breath sounds: Wheezing (LLB) present.  Skin:    General: Skin  is warm.     Capillary Refill: Capillary refill takes less than 2 seconds.  Neurological:     General: No focal deficit present.     Mental Status: He is alert and oriented to person, place, and time.  Psychiatric:        Mood and Affect: Mood normal.        Behavior: Behavior normal.      UC Treatments / Results  Labs (all labs ordered are listed, but only abnormal results are displayed) Labs Reviewed - No data to display  EKG   Radiology No results found.  Procedures Procedures (including critical care time)  Medications Ordered in UC Medications - No data to display  Initial Impression / Assessment and Plan / UC Course  I have reviewed the triage vital signs and the nursing notes.  Pertinent labs & imaging results that were available during my care of the patient were reviewed by me and  considered in my medical decision making (see chart for details).    Shortness of breath Final Clinical Impressions(s) / UC Diagnoses   Final diagnoses:  Wheezing  Pulmonary emphysema, unspecified emphysema type ([place])  Declined Duoneb Declined xray  Declined COVID/Flu testing   Discharge Instructions      Started on Prednisone 10 mg Dosepak (per patient request for best treatment ) Avoid things that make it hard for you to breathe, such as smoking, pollution, mold, and dust. Watch for any changes in your symptoms. Contact your doctor if you do not get better or you get worse.     ED Prescriptions     Medication Sig Dispense Auth. Provider   predniSONE (STERAPRED UNI-PAK 21 TAB) 10 MG (21) TBPK tablet Take by mouth daily. Take 6 tabs by mouth daily  for 2 days, then 5 tabs for 2 days, then 4 tabs for 2 days, then 3 tabs for 2 days, 2 tabs for 2 days, then 1 tab by mouth daily for 2 days 42 tablet Vevelyn Francois, NP      PDMP not reviewed this encounter.   Dionisio David [place], [place] 03/19/22 [phone]

## 2022-03-19 NOTE — ED Triage Notes (Signed)
Pt c/o emphysema flare up since yesterday and worse this am. C/o SOB, wheezing and chest tightness. States used his inhaler with no relief. No distress noted. Speaking in complete sentences.

## 2022-03-19 NOTE — Discharge Instructions (Addendum)
Started on Prednisone 10 mg Dosepak (per patient request for best treatment ) Avoid things that make it hard for you to breathe, such as smoking, pollution, mold, and dust. Watch for any changes in your symptoms. Contact your doctor if you do not get better or you get worse.

## 2022-03-26 ENCOUNTER — Other Ambulatory Visit: Payer: Self-pay | Admitting: Surgery

## 2022-03-26 DIAGNOSIS — C220 Liver cell carcinoma: Secondary | ICD-10-CM

## 2022-03-27 ENCOUNTER — Encounter (HOSPITAL_BASED_OUTPATIENT_CLINIC_OR_DEPARTMENT_OTHER): Payer: Self-pay | Admitting: Family Medicine

## 2022-03-30 DIAGNOSIS — J441 Chronic obstructive pulmonary disease with (acute) exacerbation: Secondary | ICD-10-CM | POA: Diagnosis not present

## 2022-04-07 ENCOUNTER — Other Ambulatory Visit (HOSPITAL_BASED_OUTPATIENT_CLINIC_OR_DEPARTMENT_OTHER): Payer: Self-pay | Admitting: Family Medicine

## 2022-04-07 NOTE — Telephone Encounter (Signed)
RSV Vaccine Recommendations:  The FDA approved the RSV (Respiratory Syncytial Virus) Vaccine in May 2023 for individuals >75 years old with recommendations to target patient with high risk for severe RSV infection. You would likely benefit from this vaccine based on your age and chronic medical conditions. Our office currently is awaiting the arrival for the vaccines and expect them to be available in the next couple weeks. All [place] facilities will be administering Arexvy ([place] vaccine.)  The main contraindication is avoiding this vaccine in individuals with history of severe allergic reactions (e.g. anaphylaxis). We can discuss further with a shared decision making visit to fully address questions or concerns before you receive it if you would like. Please let us know if you would like to make an in-person or video visit.

## 2022-04-08 DIAGNOSIS — Z8546 Personal history of malignant neoplasm of prostate: Secondary | ICD-10-CM | POA: Diagnosis not present

## 2022-04-08 DIAGNOSIS — K589 Irritable bowel syndrome without diarrhea: Secondary | ICD-10-CM | POA: Diagnosis not present

## 2022-04-08 DIAGNOSIS — Z87891 Personal history of nicotine dependence: Secondary | ICD-10-CM | POA: Diagnosis not present

## 2022-04-10 ENCOUNTER — Encounter (HOSPITAL_BASED_OUTPATIENT_CLINIC_OR_DEPARTMENT_OTHER): Payer: Self-pay | Admitting: Family Medicine

## 2022-04-10 ENCOUNTER — Ambulatory Visit (INDEPENDENT_AMBULATORY_CARE_PROVIDER_SITE_OTHER): Payer: PPO | Admitting: Family Medicine

## 2022-04-10 VITALS — BP 142/76 | HR 79 | Ht 71.0 in | Wt 173.1 lb

## 2022-04-10 DIAGNOSIS — E785 Hyperlipidemia, unspecified: Secondary | ICD-10-CM | POA: Diagnosis not present

## 2022-04-10 DIAGNOSIS — I10 Essential (primary) hypertension: Secondary | ICD-10-CM | POA: Diagnosis not present

## 2022-04-10 DIAGNOSIS — R7303 Prediabetes: Secondary | ICD-10-CM | POA: Diagnosis not present

## 2022-04-10 NOTE — Assessment & Plan Note (Signed)
Blood pressure borderline in office today, has been previously well controlled with current medication regimen.  He denies any acute concerns such as headaches or chest pain.  He was having some intermittent lightheadedness in the evenings at last office visit, however this has resolved.  He continues with losartan and hydrochlorothiazide.  Next appointment with cardiology is in about 3 months Recommend intermittent monitoring of blood pressure at home, DASH diet.  No changes to medication regimen at this time

## 2022-04-10 NOTE — Patient Instructions (Signed)
  Medication Instructions:  Your physician recommends that you continue on your current medications as directed. Please refer to the Current Medication list given to you today. --If you need a refill on any your medications before your next appointment, please call your pharmacy first. If no refills are authorized on file call the office.-- Lab Work: Your physician has recommended that you have lab work today: No If you have labs (blood work) drawn today and your tests are completely normal, you will receive your results via [facility] a phone call from our staff.  Please ensure you check your voicemail in the event that you authorized detailed messages to be left on a delegated number. If you have any lab test that is abnormal or we need to change your treatment, we will call you to review the results.  Referrals/Procedures/Imaging: No  Follow-Up: Your next appointment:   Your physician recommends that you schedule a follow-up appointment in: 3-4 months follow-up with Dr. de [place].  You will receive a text message or e-mail with a link to a survey about your care and experience with [place] today! We would greatly appreciate your feedback!   Thanks for letting us be apart of your health journey!!  Primary Care and Sports Medicine   Dr. Arlina Robes [place]   We encourage you to activate your patient portal called "MyChart".  Sign up information is provided on this After Visit Summary.  MyChart is used to connect with patients for Virtual Visits (Telemedicine).  Patients are able to view lab/test results, encounter notes, upcoming appointments, etc.  Non-urgent messages can be sent to your provider as well. To learn more about what you can do with MyChart, please visit --  [email or web address].

## 2022-04-10 NOTE — Progress Notes (Signed)
    Procedures performed today:    None.  Independent interpretation of notes and tests performed by another provider:   None.  Brief History, Exam, Impression, and Recommendations:    BP (!) 142/76   Pulse 79   Ht '5\' 11"'$  (1.803 m)   Wt 173 lb 1.6 oz (78.5 kg)   SpO2 97%   BMI 24.14 kg/m   Prediabetes Hemoglobin A1c checked shortly after last office visit and was stable at 6.0%.  He continues with lifestyle modifications, no acute concerns today.  No new symptoms such as polyuria polydipsia Recommend continuing with lifestyle modifications and we will continue with monitoring hemoglobin A1c every 4 to 6 months.  Plan to recheck shortly before next appointment in about 3 to 4 months  Essential hypertension, benign Blood pressure borderline in office today, has been previously well controlled with current medication regimen.  He denies any acute concerns such as headaches or chest pain.  He was having some intermittent lightheadedness in the evenings at last office visit, however this has resolved.  He continues with losartan and hydrochlorothiazide.  Next appointment with cardiology is in about 3 months Recommend intermittent monitoring of blood pressure at home, DASH diet.  No changes to medication regimen at this time  Hyperlipidemia with target LDL less than 70 Patient continues with rosuvastatin 5 mg daily.  Denies any issues with medication at this time.  Did check lipid panel shortly after last appointment and was found to be stable with LDL still slightly above target.  At this time, we will continue with current dose of rosuvastatin, may need to consider slight increase in dose if LDL remains above target He will have appointment with cardiology later this year  Return in about 4 months (around 08/10/2022) for HLD, HTN, PreDM.   ___________________________________________ Teriana Danker de [place], MD, ABFM, [facility] Primary [facility] and [place]

## 2022-04-10 NOTE — Assessment & Plan Note (Signed)
Patient continues with rosuvastatin 5 mg daily.  Denies any issues with medication at this time.  Did check lipid panel shortly after last appointment and was found to be stable with LDL still slightly above target.  At this time, we will continue with current dose of rosuvastatin, may need to consider slight increase in dose if LDL remains above target He will have appointment with cardiology later this year

## 2022-04-10 NOTE — Assessment & Plan Note (Signed)
Hemoglobin A1c checked shortly after last office visit and was stable at 6.0%.  He continues with lifestyle modifications, no acute concerns today.  No new symptoms such as polyuria polydipsia Recommend continuing with lifestyle modifications and we will continue with monitoring hemoglobin A1c every 4 to 6 months.  Plan to recheck shortly before next appointment in about 3 to 4 months

## 2022-04-13 ENCOUNTER — Encounter (HOSPITAL_COMMUNITY): Payer: Self-pay

## 2022-04-13 ENCOUNTER — Ambulatory Visit (INDEPENDENT_AMBULATORY_CARE_PROVIDER_SITE_OTHER): Payer: PPO

## 2022-04-13 ENCOUNTER — Ambulatory Visit (HOSPITAL_COMMUNITY)
Admission: EM | Admit: 2022-04-13 | Discharge: 2022-04-13 | Disposition: A | Discharge status: Home / Self Care | Payer: PPO

## 2022-04-13 DIAGNOSIS — J439 Emphysema, unspecified: Secondary | ICD-10-CM | POA: Diagnosis not present

## 2022-04-13 DIAGNOSIS — H60333 Swimmer's ear, bilateral: Secondary | ICD-10-CM

## 2022-04-13 DIAGNOSIS — R0602 Shortness of breath: Secondary | ICD-10-CM

## 2022-04-13 DIAGNOSIS — J449 Chronic obstructive pulmonary disease, unspecified: Secondary | ICD-10-CM | POA: Diagnosis not present

## 2022-04-13 MED ORDER — CIPROFLOXACIN-DEXAMETHASONE 0.3-0.1 % OT SUSP: 4.0000 [drp] | Freq: Two times a day (BID) | OTIC | 0 refills | Status: AC

## 2022-04-13 MED ORDER — PREDNISONE 20 MG PO TABS: 40.0000 mg | ORAL_TABLET | Freq: Every day | ORAL | 0 refills | Status: AC

## 2022-04-13 NOTE — Discharge Instructions (Addendum)
Take the prednisone as prescribed. Use your nebulizer treatment every 6 hours as needed for shortness of breath or wheezing.  It's very important to follow up with your pulmonary specialist as soon as possible. You likely need adjustments in your therapy plan due to recent exacerbations.  Please go to the emergency department if symptoms worsen.

## 2022-04-13 NOTE — ED Triage Notes (Signed)
Pt complains of S.O.B since yesterday

## 2022-04-13 NOTE — ED Provider Notes (Signed)
George Watson    CSN: [id number] Arrival date & time: 04/13/22  1005     History   Chief Complaint Chief Complaint  Patient presents with   Shortness of Breath    HPI George Watson is a 76 y.o. male.  Presents with shortness of breath that began last night.  Reports history of COPD and emphysema, feels he is having a flare up Denies any fever, chills, other URI symptoms.  Small cough. Denies chest pain. Uses his albuterol inhaler and Trelegy Has a pulmonologist he will be seeing soon  Was seen 8/30 for similar, given prednisone unipak at that time. Finished about 2 weeks ago  Additionally some left ear pain for the past few days.  [place].  He reports lots of swimming recently.  No hearing changes or drainage  Past Medical History:  Diagnosis Date   Aortic aneurysm (HCC)    Arthritis    BPH (benign prostatic hypertrophy)    Cataract 2015   Cirrhosis (HCC)    COPD (chronic obstructive pulmonary disease) (HCC)    DDD (degenerative disc disease), lumbosacral    Dyspnea    Elevated PSA    Emphysema of lung (HCC)    Essential hypertension, benign    diet controlled   Fracture of shaft of clavicle    GERD (gastroesophageal reflux disease)    H/O drug abuse ([place])    multisubstance   Hepatitis B    Hepatitis C 10/1996   HSV-2 (herpes simplex virus 2) infection    Hx of biopsy 08/2004   liver   Hx of colonic polyps 2022   Hyperlipidemia    Inguinal hernia    right   Other and unspecified hyperlipidemia    Prostate cancer ([place])    Substance abuse ([place])    Tuberculosis     Patient Active Problem List   Diagnosis Date Noted   Bruising 02/06/2022   Hepatocellular carcinoma ([place]) 10/14/2021   Bloating 09/17/2021   COVID-19 08/13/2021   Lung nodule 10/05/2020   Gastroenteritis 05/08/2020   TIA (transient ischemic attack) 07/07/2019   Dilated aortic root ([place]) 06/13/2019   Pure hypertriglyceridemia 04/08/2019   Fear of flying 02/11/2018   Tear of  right rotator cuff 08/31/2017   Wellness examination 01/07/2017   Prediabetes 01/06/2017   Atherosclerosis of aorta ([place]) 07/09/2016   Degeneration of lumbar or lumbosacral intervertebral disc 02/15/2014   Essential hypertension, benign    Hyperlipidemia with target LDL less than 70    HSV-2 (herpes simplex virus 2) infection    COPD (chronic obstructive pulmonary disease) (HCC)    Benign prostatic hyperplasia    DDD (degenerative disc disease), lumbosacral    Prostate cancer ([place])    Cirrhosis ([place]) 05/21/1997   Hepatitis C, chronic ([place]) 10/19/1996    Past Surgical History:  Procedure Laterality Date   BACK SURGERY     CERVICAL SPINE SURGERY     EYE SURGERY  3/20   fracture ribs     3   HERNIA REPAIR     INGUINAL HERNIA REPAIR  07/01/2012   Procedure: HERNIA REPAIR INGUINAL ADULT;  Surgeon: Madilyn Hook, DO;  Location: [facility];  Service: General;  Laterality: Right;  with Mesh   LAPAROSCOPIC LIVER ULTRASOUND N/A 11/21/2021   Procedure: LAPAROSCOPIC LIVER ULTRASOUND;  Surgeon: Dwan Bolt, MD;  Location: [place];  Service: General;  Laterality: N/A;   LAPAROSCOPIC PARTIAL HEPATECTOMY Left 11/21/2021   Procedure: LAPAROSCOPIC PARTIAL HEPATECTOMY;  Surgeon: Dwan Bolt,  MD;  Location: [facility];  Service: General;  Laterality: Left;   LAPAROSCOPY N/A 11/21/2021   Procedure: STAGING LAPAROSCOPY;  Surgeon: Dwan Bolt, MD;  Location: [facility];  Service: General;  Laterality: N/A;   left knee meniscus repair  07/21/1990   right rotator cuff  07/21/1990   right shoulder arthroscopy  07/21/2009   SPINE SURGERY  10/14       Home Medications    Prior to Admission medications   Medication Sig Start Date End Date Taking? Authorizing Provider  budesonide (ENTOCORT EC) 3 MG 24 hr capsule Take 3 capsules by mouth every morning. 04/16/20  Yes [provider]  ciprofloxacin-dexamethasone (CIPRODEX) OTIC suspension Place 4 drops into both ears 2 (two) times daily for 5 days. 04/13/22  04/18/22 Yes Ashlin Hidalgo, Wells Guiles, PA-C  fluorouracil (EFUDEX) 5 % cream 1 application Externally Twice a day for 14 days 03/06/22  Yes [provider]  mupirocin ointment (BACTROBAN) 2 % 1 application Externally Twice a day for 30 days 03/06/22 07/03/22 Yes [provider]  predniSONE (DELTASONE) 20 MG tablet Take 2 tablets (40 mg total) by mouth daily with breakfast for 5 days. 04/13/22 04/18/22 Yes Paige Vanderwoude, Wells Guiles, PA-C  albuterol (PROVENTIL) (2.5 MG/3ML) 0.083% nebulizer solution Take 3 mLs (2.5 mg total) by nebulization every 6 (six) hours as needed for wheezing or shortness of breath. 05/29/21 05/29/22  Parrett, Fonnie Mu, NP  albuterol (VENTOLIN HFA) 108 (90 Base) MCG/ACT inhaler INHALE 2 PUFFS INTO THE LUNGS EVERY 6 HOURS AS NEEDED FOR SHORTNESS OF BREATH 01/09/22   [place], Blondell Reveal, MD  aspirin EC 81 MG EC tablet Take 1 tablet (81 mg total) by mouth daily. 07/09/19   Black, Lezlie Octave, NP  atorvastatin (LIPITOR) 20 MG tablet     [provider]  B Complex Vitamins (B COMPLEX PO) Take 1 tablet by mouth daily.    [provider]  cholecalciferol (VITAMIN D3) 25 MCG (1000 UT) tablet Take 1,000 Units by mouth daily.    [provider]  Coenzyme Q10-Vitamin E 100-300 MG-UNIT CHEW Chew 1 tablet by mouth daily.    [provider]  diazepam (VALIUM) 2 MG tablet Take 1-2 tabs 30 minutes before flight. 07/23/21   [place], Blondell Reveal, MD  diazepam (VALIUM) 5 MG tablet TAKE 1 TABLET ONE HOUR PRIOR TO YOUR SCHEDULED MRI    [provider]  diphenoxylate-atropine (LOMOTIL) 2.5-0.025 MG tablet Take 1 tablet by mouth 4 (four) times daily as needed for diarrhea or loose stools. 12/26/21   Armbruster, Carlota Raspberry, MD  docusate sodium (COLACE) 100 MG capsule Take 1 capsule (100 mg total) by mouth 2 (two) times daily. 11/22/21   Dwan Bolt, MD  dutasteride (AVODART) 0.5 MG capsule Take 0.5 mg by mouth daily.    [provider]  etodolac (LODINE) 300 MG capsule      [provider]  ezetimibe (ZETIA) 10 MG tablet Take 1 tablet by mouth daily.    [provider]  famotidine (PEPCID) 20 MG tablet Take 20 mg by mouth every evening.     [provider]  fluticasone (FLONASE) 50 MCG/ACT nasal spray Place 2 sprays into both nostrils daily. 01/08/22   [place], Blondell Reveal, MD  Fluticasone-Umeclidin-Vilant (TRELEGY ELLIPTA) 100-62.5-25 MCG/ACT AEPB INHALE 1 PUFF BY MOUTH EVERY DAY 01/20/22   Parrett, Fonnie Mu, NP  glucosamine-chondroitin 500-400 MG tablet Take 3 tablets by mouth daily. Patient taking differently: Take 2 tablets by mouth daily. 02/15/18  Marrian Salvage, FNP  hydrochlorothiazide (MICROZIDE) 12.5 MG capsule TAKE 1 CAPSULE BY MOUTH EVERY DAY 11/08/21   [place], Blondell Reveal, MD  losartan (COZAAR) 100 MG tablet TAKE 1 TABLET BY MOUTH EVERY DAY 02/03/22   [place], Blondell Reveal, MD  losartan (COZAAR) 100 MG tablet Take 1 tablet by mouth daily.    [provider]  metaxalone (SKELAXIN) 800 MG tablet TAKE 1 TABLET (800 MG TOTAL) BY MOUTH 3 (THREE) TIMES DAILY AS NEEDED FOR MUSCLE SPASMS. 09/23/21   [place], Blondell Reveal, MD  Multiple Vitamins-Minerals (CENTRUM SILVER PO) Take 1 tablet by mouth daily.    [provider]  olmesartan (BENICAR) 40 MG tablet Take 1 tablet by mouth daily.    [provider]  Omega-3 Fatty Acids (FISH OIL PO) Take 1 capsule by mouth daily.    [provider]  oxiconazole (OXISTAT) 1 % CREA     [provider]  pimecrolimus (ELIDEL) 1 % cream     [provider]  rosuvastatin (CRESTOR) 5 MG tablet TAKE 1 TABLET (5 MG TOTAL) BY MOUTH DAILY. 04/07/22   [place], Blondell Reveal, MD  tamsulosin (FLOMAX) 0.4 MG CAPS capsule Take 0.4 mg by mouth daily.     [provider]  tiotropium (SPIRIVA HANDIHALER) 18 MCG inhalation capsule     [provider]  traMADol (ULTRAM) 50 MG tablet Take 1 tablet (50 mg total) by mouth every 6 (six) hours as needed for severe  pain. 11/22/21   Dwan Bolt, MD  TURMERIC CURCUMIN PO Take 2 capsules by mouth daily.    [provider]  umeclidinium-vilanterol (ANORO ELLIPTA) 62.5-25 MCG/ACT AEPB Anoro Ellipta 62.5 mcg-25 mcg/actuation powder for inhalation  INHALE 1 PUFF BY MOUTH EVERY DAY    [provider]    Family History Family History  Problem Relation Age of Onset   Leukemia Mother    Throat cancer Father    Esophageal cancer Father    Cancer Father        esophageal cancer   Breast cancer Sister    Cancer Sister    Prostate cancer Brother    Melanoma Brother    Colon cancer Neg Hx    Stomach cancer Neg Hx    Pancreatic cancer Neg Hx    Liver disease Neg Hx     Social History Social History   Tobacco Use   Smoking status: Former    Packs/day: 1.50    Years: 43.00    Total pack years: 64.50    Types: Cigarettes    Quit date: 02/22/2003    Years since quitting: 19.1   Smokeless tobacco: Never  Vaping Use   Vaping Use: Never used  Substance Use Topics   Alcohol use: Not Currently    Comment: 1988   Drug use: Not Currently    Types: Cocaine, Heroin    Comment: 26 years ago cocaine, heroin. methadone     Allergies   Patient has no known allergies.   Review of Systems Review of Systems  Respiratory:  Positive for shortness of breath.    Per HPI  Physical Exam Triage Vital Signs ED Triage Vitals  Enc Vitals Group     BP 04/13/22 1024 (!) 160/77     Pulse Rate 04/13/22 1024 74     Resp 04/13/22 1024 20     Temp 04/13/22 1024 98.3 F (36.8 C)     Temp Source 04/13/22 1024 Oral  SpO2 04/13/22 1024 93 %     Weight 04/13/22 1025 170 lb (77.1 kg)     Height 04/13/22 1025 '5\' 11"'$  (1.803 m)     Head Circumference --      Peak Flow --      Pain Score 04/13/22 1025 0     Pain Loc --      Pain Edu? --      Excl. in [place]? --    No data found.  Updated Vital Signs BP (!) 160/77 (BP Location: Left Arm)   Pulse 74   Temp 98.3 F (36.8 C) (Oral)   Resp 20    Ht '5\' 11"'$  (1.803 m)   Wt 170 lb (77.1 kg)   SpO2 93%   BMI 23.71 kg/m    Physical Exam Vitals and nursing note reviewed.  Constitutional:      General: He is not in acute distress.    Appearance: He is not ill-appearing.  HENT:     Right Ear: Swelling present.     Left Ear: Swelling present.     Ears:     Comments: Bilat canals    Mouth/Throat:     Pharynx: Oropharynx is clear.  Cardiovascular:     Rate and Rhythm: Normal rate and regular rhythm.     Heart sounds: Normal heart sounds.  Pulmonary:     Effort: Pulmonary effort is normal. Tachypnea present. No respiratory distress.     Breath sounds: Examination of the right-upper field reveals rales. Examination of the left-upper field reveals rales. Wheezing and rales present.     Comments: Minorly tachypneic but no acute distress, able to speak in full sentences  Skin:    General: Skin is warm and dry.  Neurological:     Mental Status: He is alert and oriented to person, place, and time.     UC Treatments / Results  Labs (all labs ordered are listed, but only abnormal results are displayed) Labs Reviewed - No data to display  EKG   Radiology DG Chest 2 View  Result Date: 04/13/2022 CLINICAL DATA:  Acute shortness of breath for 1 day. EXAM: CHEST - 2 VIEW COMPARISON:  04/23/2021 and prior radiographs FINDINGS: The cardiomediastinal silhouette is unremarkable. There is no evidence of focal airspace disease, pulmonary edema, suspicious pulmonary nodule/mass, pleural effusion, or pneumothorax. No acute bony abnormalities are identified. Cervical fusion hardware and remote LEFT clavicle fracture again noted. IMPRESSION: No active cardiopulmonary disease. Electronically Signed   By: Margarette Canada M.D.   On: 04/13/2022 11:24    Procedures Procedures (including critical care time)  Medications Ordered in UC Medications - No data to display  Initial Impression / Assessment and Plan / UC Course  I have reviewed the triage  vital signs and the nursing notes.  Pertinent labs & imaging results that were available during my care of the patient were reviewed by me and considered in my medical decision making (see chart for details).  Overall well appearing and sating 94% on room air. Patient declined DuoNeb, will use his nebulizer at home.  Chest xray negative for acute change.  With flare up occurring so soon after prednisone taper, and home treatments not controlling symptoms, patient needs close follow up with his pulmonologist. Will do short course 40 mg prednisone daily for next 5 days, advised to use breathing treatments every 6 hours as needed. Most important thing will be follow up with pulm. Strict return precautions for worsening symptoms. Patient agrees to plan  Ciprodex drops for bilat otitis externa  Final diagnoses:  Chronic obstructive pulmonary disease, unspecified COPD type ([place])  Pulmonary emphysema, unspecified emphysema type ([place])  Acute swimmer's ear of both sides     Discharge Instructions      Take the prednisone as prescribed. Use your nebulizer treatment every 6 hours as needed for shortness of breath or wheezing.  It's very important to follow up with your pulmonary specialist as soon as possible. You likely need adjustments in your therapy plan due to recent exacerbations.  Please go to the emergency department if symptoms worsen.     ED Prescriptions     Medication Sig Dispense Auth. Provider   predniSONE (DELTASONE) 20 MG tablet Take 2 tablets (40 mg total) by mouth daily with breakfast for 5 days. 10 tablet Willeen Novak, PA-C   ciprofloxacin-dexamethasone (CIPRODEX) OTIC suspension Place 4 drops into both ears 2 (two) times daily for 5 days. 7.5 mL Estefano Victory, Wells Guiles, PA-C      PDMP not reviewed this encounter.   Keiandre Cygan, Wells Guiles, [place] 04/13/22 1148

## 2022-04-22 ENCOUNTER — Ambulatory Visit
Admission: RE | Admit: 2022-04-22 | Discharge: 2022-04-22 | Disposition: A | Discharge status: Home / Self Care | Payer: PPO | Source: Ambulatory Visit | Attending: Surgery | Admitting: Surgery

## 2022-04-22 DIAGNOSIS — Z8546 Personal history of malignant neoplasm of prostate: Secondary | ICD-10-CM | POA: Diagnosis not present

## 2022-04-22 DIAGNOSIS — C220 Liver cell carcinoma: Secondary | ICD-10-CM

## 2022-04-22 DIAGNOSIS — Z8505 Personal history of malignant neoplasm of liver: Secondary | ICD-10-CM | POA: Diagnosis not present

## 2022-04-22 DIAGNOSIS — K7469 Other cirrhosis of liver: Secondary | ICD-10-CM | POA: Diagnosis not present

## 2022-04-22 DIAGNOSIS — K5730 Diverticulosis of large intestine without perforation or abscess without bleeding: Secondary | ICD-10-CM | POA: Diagnosis not present

## 2022-04-22 MED ORDER — IOPAMIDOL (ISOVUE-300) INJECTION 61%
100.0000 mL | Freq: Once | INTRAVENOUS | Status: AC | PRN
  Administered 2022-04-22: 100 mL via INTRAVENOUS

## 2022-04-23 DIAGNOSIS — R7982 Elevated C-reactive protein (CRP): Secondary | ICD-10-CM | POA: Diagnosis not present

## 2022-04-23 DIAGNOSIS — E782 Mixed hyperlipidemia: Secondary | ICD-10-CM | POA: Diagnosis not present

## 2022-04-23 DIAGNOSIS — H6092 Unspecified otitis externa, left ear: Secondary | ICD-10-CM | POA: Diagnosis not present

## 2022-04-23 DIAGNOSIS — K769 Liver disease, unspecified: Secondary | ICD-10-CM | POA: Diagnosis not present

## 2022-04-23 DIAGNOSIS — E063 Autoimmune thyroiditis: Secondary | ICD-10-CM | POA: Diagnosis not present

## 2022-04-23 DIAGNOSIS — E039 Hypothyroidism, unspecified: Secondary | ICD-10-CM | POA: Diagnosis not present

## 2022-04-23 DIAGNOSIS — K580 Irritable bowel syndrome with diarrhea: Secondary | ICD-10-CM | POA: Diagnosis not present

## 2022-04-23 DIAGNOSIS — E279 Disorder of adrenal gland, unspecified: Secondary | ICD-10-CM | POA: Diagnosis not present

## 2022-04-23 DIAGNOSIS — E8881 Metabolic syndrome: Secondary | ICD-10-CM | POA: Diagnosis not present

## 2022-04-23 DIAGNOSIS — J449 Chronic obstructive pulmonary disease, unspecified: Secondary | ICD-10-CM | POA: Diagnosis not present

## 2022-04-23 DIAGNOSIS — E509 Vitamin A deficiency, unspecified: Secondary | ICD-10-CM | POA: Diagnosis not present

## 2022-04-23 DIAGNOSIS — E617 Deficiency of multiple nutrient elements: Secondary | ICD-10-CM | POA: Diagnosis not present

## 2022-04-23 DIAGNOSIS — R5383 Other fatigue: Secondary | ICD-10-CM | POA: Diagnosis not present

## 2022-04-27 ENCOUNTER — Other Ambulatory Visit: Payer: Self-pay | Admitting: Gastroenterology

## 2022-04-27 DIAGNOSIS — Z0289 Encounter for other administrative examinations: Secondary | ICD-10-CM | POA: Diagnosis not present

## 2022-04-28 DIAGNOSIS — C220 Liver cell carcinoma: Secondary | ICD-10-CM | POA: Diagnosis not present

## 2022-04-29 ENCOUNTER — Telehealth: Payer: Self-pay

## 2022-04-29 DIAGNOSIS — J441 Chronic obstructive pulmonary disease with (acute) exacerbation: Secondary | ICD-10-CM | POA: Diagnosis not present

## 2022-04-29 DIAGNOSIS — H6092 Unspecified otitis externa, left ear: Secondary | ICD-10-CM | POA: Diagnosis not present

## 2022-04-29 NOTE — Telephone Encounter (Signed)
-----   Message from Yetta Flock, MD sent at 04/28/2022  5:26 PM EDT ----- Regarding: FW: [place] patient George Watson can you help schedule this patient with me or APP in the office? Thanks  ----- Message ----- From: Dwan Bolt, MD Sent: 04/28/2022   4:43 PM EDT To: Yetta Flock, MD Subject: [facility] patient                                    Dr. Havery Moros, This patient has seen you in the past for [facility] surveillance, he has a history of HCV. I did a small liver resection on him in May for an [facility] in the left liver and am following him for surveillance now. He has developed some new GI symptoms of early satiety and bloating that have gotten a lot worse lately. His recent CT does not show any ascites or gastric distension, and no clear cause for his symptoms. I am continuing to follow him for [facility] surveillance but I don't have much else to offer him and am not sure what is causing the early satiety. Would you be willing to see him again at some point? Thanks, [name]

## 2022-04-29 NOTE — Telephone Encounter (Signed)
Lm on vm for patient to return call to schedule follow up with Dr. Havery Moros or an APP.

## 2022-05-01 NOTE — Telephone Encounter (Signed)
Lm on vm for patient to return call to schedule follow up appt with Dr. Havery Moros or an APP at his convenience.

## 2022-05-02 ENCOUNTER — Encounter (HOSPITAL_BASED_OUTPATIENT_CLINIC_OR_DEPARTMENT_OTHER): Payer: Self-pay

## 2022-05-02 ENCOUNTER — Ambulatory Visit (INDEPENDENT_AMBULATORY_CARE_PROVIDER_SITE_OTHER): Payer: PPO

## 2022-05-02 DIAGNOSIS — Z0000 Encounter for general adult medical examination without abnormal findings: Secondary | ICD-10-CM

## 2022-05-02 NOTE — Patient Instructions (Signed)
Health Maintenance, Male Adopting a healthy lifestyle and getting preventive care are important in promoting health and wellness. Ask your health care provider about: The right schedule for you to have regular tests and exams. Things you can do on your own to prevent diseases and keep yourself healthy. What should I know about diet, weight, and exercise? Eat a healthy diet  Eat a diet that includes plenty of vegetables, fruits, low-fat dairy products, and lean protein. Do not eat a lot of foods that are high in solid fats, added sugars, or sodium. Maintain a healthy weight Body mass index (BMI) is a measurement that can be used to identify possible weight problems. It estimates body fat based on height and weight. Your health care provider can help determine your BMI and help you achieve or maintain a healthy weight. Get regular exercise Get regular exercise. This is one of the most important things you can do for your health. Most adults should: Exercise for at least 150 minutes each week. The exercise should increase your heart rate and make you sweat (moderate-intensity exercise). Do strengthening exercises at least twice a week. This is in addition to the moderate-intensity exercise. Spend less time sitting. Even light physical activity can be beneficial. Watch cholesterol and blood lipids Have your blood tested for lipids and cholesterol at 75 years of age, then have this test every 5 years. You may need to have your cholesterol levels checked more often if: Your lipid or cholesterol levels are high. You are older than 75 years of age. You are at high risk for heart disease. What should I know about cancer screening? Many types of cancers can be detected early and may often be prevented. Depending on your health history and family history, you may need to have cancer screening at various ages. This may include screening for: Colorectal cancer. Prostate cancer. Skin cancer. Lung  cancer. What should I know about heart disease, diabetes, and high blood pressure? Blood pressure and heart disease High blood pressure causes heart disease and increases the risk of stroke. This is more likely to develop in people who have high blood pressure readings or are overweight. Talk with your health care provider about your target blood pressure readings. Have your blood pressure checked: Every 3-5 years if you are 18-39 years of age. Every year if you are 40 years old or older. If you are between the ages of 65 and 75 and are a current or former smoker, ask your health care provider if you should have a one-time screening for abdominal aortic aneurysm (AAA). Diabetes Have regular diabetes screenings. This checks your fasting blood sugar level. Have the screening done: Once every three years after age 45 if you are at a normal weight and have a low risk for diabetes. More often and at a younger age if you are overweight or have a high risk for diabetes. What should I know about preventing infection? Hepatitis B If you have a higher risk for hepatitis B, you should be screened for this virus. Talk with your health care provider to find out if you are at risk for hepatitis B infection. Hepatitis C Blood testing is recommended for: Everyone born from 1945 through 1965. Anyone with known risk factors for hepatitis C. Sexually transmitted infections (STIs) You should be screened each year for STIs, including gonorrhea and chlamydia, if: You are sexually active and are younger than 75 years of age. You are older than 75 years of age and your   health care provider tells you that you are at risk for this type of infection. Your sexual activity has changed since you were last screened, and you are at increased risk for chlamydia or gonorrhea. Ask your health care provider if you are at risk. Ask your health care provider about whether you are at high risk for HIV. Your health care provider  may recommend a prescription medicine to help prevent HIV infection. If you choose to take medicine to prevent HIV, you should first get tested for HIV. You should then be tested every 3 months for as long as you are taking the medicine. Follow these instructions at home: Alcohol use Do not drink alcohol if your health care provider tells you not to drink. If you drink alcohol: Limit how much you have to 0-2 drinks a day. Know how much alcohol is in your drink. In the [place] one drink equals one 12 oz bottle of beer (355 mL), one 5 oz glass of wine (148 mL), or one 1 oz glass of hard liquor (44 mL). Lifestyle Do not use any products that contain nicotine or tobacco. These products include cigarettes, chewing tobacco, and vaping devices, such as e-cigarettes. If you need help quitting, ask your health care provider. Do not use street drugs. Do not share needles. Ask your health care provider for help if you need support or information about quitting drugs. General instructions Schedule regular health, dental, and eye exams. Stay current with your vaccines. Tell your health care provider if: You often feel depressed. You have ever been abused or do not feel safe at home. Summary Adopting a healthy lifestyle and getting preventive care are important in promoting health and wellness. Follow your health care provider's instructions about healthy diet, exercising, and getting tested or screened for diseases. Follow your health care provider's instructions on monitoring your cholesterol and blood pressure. This information is not intended to replace advice given to you by your health care provider. Make sure you discuss any questions you have with your health care provider. Document Revised: 11/26/2020 Document Reviewed: 11/26/2020 Elsevier Patient Education  2023 [employer]  

## 2022-05-02 NOTE — Progress Notes (Signed)
Subjective:   George Watson is a 76 y.o. male who presents for Medicare Annual/Subsequent preventive examination.  I connected with  George Watson on 05/02/22 by a audio enabled telemedicine application and verified that I am speaking with the correct person using two identifiers.  Patient Location: Home  Provider Location: Home Office  I discussed the limitations of evaluation and management by telemedicine. The patient expressed understanding and agreed to proceed.     Objective:    Today's Vitals   There is no height or weight on file to calculate BMI.     11/14/2021    1:06 PM 11/15/2020    1:56 PM 10/10/2019    2:08 PM 07/07/2019    9:01 PM 07/07/2019    8:47 PM 07/07/2019    5:17 AM 04/02/2017    1:55 PM  Advanced Directives  Does Patient Have a Medical Advance Directive? Yes Yes Yes Yes Yes No No;Yes  Type of Paramedic of [place];Living will Living will Living will;Healthcare Power of Attorney Living will Living will    Does patient want to make changes to medical advance directive?   No - Patient declined No - Patient declined     Copy of [place] in Chart?   No - copy requested      Would patient like information on creating a medical advance directive?    No - Patient declined No - Patient declined No - Patient declined     Current Medications (verified) Outpatient Encounter Medications as of 05/02/2022  Medication Sig   albuterol (PROVENTIL) (2.5 MG/3ML) 0.083% nebulizer solution Take 3 mLs (2.5 mg total) by nebulization every 6 (six) hours as needed for wheezing or shortness of breath.   albuterol (VENTOLIN HFA) 108 (90 Base) MCG/ACT inhaler INHALE 2 PUFFS INTO THE LUNGS EVERY 6 HOURS AS NEEDED FOR SHORTNESS OF BREATH   aspirin EC 81 MG EC tablet Take 1 tablet (81 mg total) by mouth daily.   B Complex Vitamins (B COMPLEX PO) Take 1 tablet by mouth daily.   cholecalciferol (VITAMIN D3) 25 MCG (1000 UT) tablet Take  1,000 Units by mouth daily.   diazepam (VALIUM) 2 MG tablet Take 1-2 tabs 30 minutes before flight.   diphenoxylate-atropine (LOMOTIL) 2.5-0.025 MG tablet TAKE 1 TABLET BY MOUTH FOUR TIMES A DAY AS NEEDED FOR DIARRHEA OR LOOSE STOOLS   dutasteride (AVODART) 0.5 MG capsule Take 0.5 mg by mouth daily.   famotidine (PEPCID) 20 MG tablet Take 20 mg by mouth every evening.    fluorouracil (EFUDEX) 5 % cream 1 application Externally Twice a day for 14 days   Fluticasone-Umeclidin-Vilant (TRELEGY ELLIPTA) 100-62.5-25 MCG/ACT AEPB INHALE 1 PUFF BY MOUTH EVERY DAY   glucosamine-chondroitin 500-400 MG tablet Take 3 tablets by mouth daily. (Patient taking differently: Take 2 tablets by mouth daily.)   hydrochlorothiazide (MICROZIDE) 12.5 MG capsule TAKE 1 CAPSULE BY MOUTH EVERY DAY   losartan (COZAAR) 100 MG tablet TAKE 1 TABLET BY MOUTH EVERY DAY   losartan (COZAAR) 100 MG tablet Take 1 tablet by mouth daily.   metaxalone (SKELAXIN) 800 MG tablet TAKE 1 TABLET (800 MG TOTAL) BY MOUTH 3 (THREE) TIMES DAILY AS NEEDED FOR MUSCLE SPASMS.   Multiple Vitamins-Minerals (CENTRUM SILVER PO) Take 1 tablet by mouth daily.   mupirocin ointment (BACTROBAN) 2 % 1 application Externally Twice a day for 30 days   Omega-3 Fatty Acids (FISH OIL PO) Take 1 capsule by mouth daily.  oxiconazole (OXISTAT) 1 % CREA    pimecrolimus (ELIDEL) 1 % cream    rosuvastatin (CRESTOR) 5 MG tablet TAKE 1 TABLET (5 MG TOTAL) BY MOUTH DAILY.   tamsulosin (FLOMAX) 0.4 MG CAPS capsule Take 0.4 mg by mouth daily.    TURMERIC CURCUMIN PO Take 2 capsules by mouth daily.   atorvastatin (LIPITOR) 20 MG tablet    budesonide (ENTOCORT EC) 3 MG 24 hr capsule Take 3 capsules by mouth every morning.   Coenzyme Q10-Vitamin E 100-300 MG-UNIT CHEW Chew 1 tablet by mouth daily.   diazepam (VALIUM) 5 MG tablet TAKE 1 TABLET ONE HOUR PRIOR TO YOUR SCHEDULED MRI   docusate sodium (COLACE) 100 MG capsule Take 1 capsule (100 mg total) by mouth 2 (two) times  daily.   etodolac (LODINE) 300 MG capsule    ezetimibe (ZETIA) 10 MG tablet Take 1 tablet by mouth daily.   fluticasone (FLONASE) 50 MCG/ACT nasal spray Place 2 sprays into both nostrils daily.   olmesartan (BENICAR) 40 MG tablet Take 1 tablet by mouth daily.   tiotropium (SPIRIVA HANDIHALER) 18 MCG inhalation capsule    traMADol (ULTRAM) 50 MG tablet Take 1 tablet (50 mg total) by mouth every 6 (six) hours as needed for severe pain.   umeclidinium-vilanterol (ANORO ELLIPTA) 62.5-25 MCG/ACT AEPB Anoro Ellipta 62.5 mcg-25 mcg/actuation powder for inhalation  INHALE 1 PUFF BY MOUTH EVERY DAY   No facility-administered encounter medications on file as of 05/02/2022.    Allergies (verified) Patient has no known allergies.   History: Past Medical History:  Diagnosis Date   Aortic aneurysm (HCC)    Arthritis    BPH (benign prostatic hypertrophy)    Cataract 2015   Cirrhosis (HCC)    COPD (chronic obstructive pulmonary disease) (HCC)    DDD (degenerative disc disease), lumbosacral    Dyspnea    Elevated PSA    Emphysema of lung (HCC)    Essential hypertension, benign    diet controlled   Fracture of shaft of clavicle    GERD (gastroesophageal reflux disease)    H/O drug abuse ([place])    multisubstance   Hepatitis B    Hepatitis C 10/1996   HSV-2 (herpes simplex virus 2) infection    Hx of biopsy 08/2004   liver   Hx of colonic polyps 2022   Hyperlipidemia    Inguinal hernia    right   Other and unspecified hyperlipidemia    Prostate cancer ([place])    Substance abuse ([place])    Tuberculosis    Past Surgical History:  Procedure Laterality Date   BACK SURGERY     CERVICAL SPINE SURGERY     EYE SURGERY  3/20   fracture ribs     3   HERNIA REPAIR     INGUINAL HERNIA REPAIR  07/01/2012   Procedure: HERNIA REPAIR INGUINAL ADULT;  Surgeon: Madilyn Hook, DO;  Location: [facility];  Service: General;  Laterality: Right;  with Mesh   LAPAROSCOPIC LIVER ULTRASOUND N/A 11/21/2021    Procedure: LAPAROSCOPIC LIVER ULTRASOUND;  Surgeon: Dwan Bolt, MD;  Location: [facility];  Service: General;  Laterality: N/A;   LAPAROSCOPIC PARTIAL HEPATECTOMY Left 11/21/2021   Procedure: LAPAROSCOPIC PARTIAL HEPATECTOMY;  Surgeon: Dwan Bolt, MD;  Location: [facility];  Service: General;  Laterality: Left;   LAPAROSCOPY N/A 11/21/2021   Procedure: STAGING LAPAROSCOPY;  Surgeon: Dwan Bolt, MD;  Location: [facility];  Service: General;  Laterality: N/A;   left knee meniscus repair  07/21/1990   right rotator cuff  07/21/1990   right shoulder arthroscopy  07/21/2009   SPINE SURGERY  10/14   Family History  Problem Relation Age of Onset   Leukemia Mother    Throat cancer Father    Esophageal cancer Father    Cancer Father        esophageal cancer   Breast cancer Sister    Cancer Sister    Prostate cancer Brother    Melanoma Brother    Colon cancer Neg Hx    Stomach cancer Neg Hx    Pancreatic cancer Neg Hx    Liver disease Neg Hx    Social History   Socioeconomic History   Marital status: Married    Spouse name: Not on file   Number of children: 1   Years of education: Not on file   Highest education level: Not on file  Occupational History   Occupation: Geophysicist/field seismologist   Occupation: Retired     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 methadone   Sexual activity: Not Currently    Comment: number of sex partners in the last 12 months  1  Other Topics Concern   Not on file  Social History Narrative      Married to [employer]. Exercise cardio daily for 30 minutes.  Education: [employer].   Social Determinants of Health   Financial Resource Strain: Low Risk  (05/02/2022)   Overall Financial Resource Strain (CARDIA)    Difficulty of Paying Living Expenses: Not hard at all  Food Insecurity: No Food Insecurity (05/02/2022)   Hunger Vital Sign    Worried About Running Out of Food in the Last Year: Never true    Ran Out of Food in the Last Year: Never true  Transportation Needs: No Transportation Needs (05/02/2022)   PRAPARE - Hydrologist (Medical): No    Lack of Transportation (Non-Medical): No  Physical Activity: Sufficiently Active (05/02/2022)   Exercise Vital Sign    Days of Exercise per Week: 7 days    Minutes of Exercise per Session: 60 min  Stress: No Stress Concern Present (05/02/2022)   [place]    Feeling of Stress : Not at all  Social Connections: Moderately Integrated (05/02/2022)   Social Connection and Isolation Panel [NHANES]    Frequency of Communication with Friends and Family: More than three times a week    Frequency of Social Gatherings with Friends and Family: Three times a week    Attends Religious Services: 1 to 4 times per year    Active Member of Clubs or Organizations: No    Attends Archivist Meetings: Never    Marital Status: Married    Tobacco Counseling Counseling given: Yes   Clinical Intake:  Pre-visit preparation completed: Yes  Pain : No/denies pain     Diabetes: No  How often  do you need to have someone help you when you read instructions, pamphlets, or other written materials from your doctor or pharmacy?: 1 - Never What is the last grade level you completed in school?: 12TH GRADE  Diabetic?NO  Interpreter Needed?: No      Activities of Daily Living    05/02/2022   12:14 PM 04/29/2022   12:47 PM  In your present state of health, do you have any difficulty performing the following  activities:  Hearing? 0 0  Vision? 0 0  Difficulty concentrating or making decisions? 0 0  Walking or climbing stairs? 0 0  Dressing or bathing? 0 0  Doing errands, shopping? 0 0  Preparing Food and eating ?  N  Using the Toilet?  N  In the past six months, have you accidently leaked urine?  N  Do you have problems with loss of bowel control?  N  Managing your Medications?  N  Managing your Finances?  N  Housekeeping or managing your Housekeeping?  N    Patient Care Team: [place], Blondell Reveal, MD as PCP - General (Family Medicine) Verl Blalock, Marijo Conception, MD (Inactive) (Cardiology) Richmond Campbell, MD (Gastroenterology) Newt Minion, MD (Orthopedic Surgery) Kathie Rhodes, MD (Inactive) (Urology) Jola Schmidt, MD as Consulting Physician (Ophthalmology) Danella Sensing, MD as Consulting Physician (Dermatology) Truitt Merle, MD as Consulting Physician (Oncology)  Indicate any recent Medical Services you may have received from other than [facility] providers in the past year (date may be approximate).     Assessment:   This is a routine wellness examination for [facility].  Hearing/Vision screen No results found.  Dietary issues and exercise activities discussed:     Goals Addressed   None   Depression Screen    05/02/2022   12:13 PM 04/10/2022    1:25 PM 02/06/2022    2:18 PM 01/08/2022    2:04 PM 10/08/2021    1:34 PM 01/04/2021   12:10 PM 11/15/2020    1:55 PM  PHQ 2/9 Scores  PHQ - 2 Score 0 0 0 0 0 0 0  PHQ- 9 Score 0 0 0 0     Exception Documentation  Medical reason Medical reason Medical reason       Fall Risk    05/02/2022   12:13 PM 04/29/2022   12:47 PM 04/10/2022    1:25 PM 02/06/2022    2:18 PM 01/08/2022    2:04 PM  Fall Risk   Falls in the past year? 0 0 0 0 0  Number falls in past yr: 0 0 0 0 0  Injury with Fall? 0 0 0 0 0  Risk for fall due to : No Fall Risks  No Fall Risks No Fall Risks No Fall Risks  Follow up Falls evaluation completed  Falls evaluation completed  Falls evaluation completed Falls evaluation completed    [place]:  Any stairs in or around the home? Yes  If so, are there any without handrails? No  Home free of loose throw rugs in walkways, pet beds, electrical cords, etc? Yes  Adequate lighting in your home to reduce risk of falls? Yes   ASSISTIVE DEVICES UTILIZED TO PREVENT FALLS:  Life alert? No  Use of a cane, walker or w/c? No  Grab bars in the bathroom? No  Shower chair or bench in shower? No  Elevated toilet seat or a handicapped toilet? No    Cognitive Function:  05/02/2022   12:14 PM 11/15/2020    1:59 PM 10/10/2019    2:09 PM  6CIT Screen  What Year? 0 points 0 points 0 points  What month?  0 points 0 points  What time? 0 points  0 points  Count back from 20 0 points 0 points 0 points  Months in reverse 0 points 0 points 0 points  Repeat phrase 2 points 0 points 0 points  Total Score   0 points    Immunizations Immunization History  Administered Date(s) Administered   Fluad Quad(high Dose 65+) 03/29/2019   Hepatitis A 02/18/1998, 08/21/1998   Influenza Split 04/01/2011, 05/18/2012   Influenza, High Dose Seasonal PF 04/23/2016, 03/17/2018, 04/10/2021   Influenza, Seasonal, Injecte, Preservative Fre 05/01/2017   Influenza,inj,Quad PF,6+ Mos 04/28/2013, 04/21/2014   Influenza-Unspecified 04/21/2015, 05/21/2020   PFIZER(Purple Top)SARS-COV-2 Vaccination 08/10/2019, 08/31/2019, 05/21/2020, 11/19/2020   Pneumococcal Conjugate-13 08/22/2014   Pneumococcal Polysaccharide-23 07/21/2002, 07/08/2016   Tdap 11/18/2005, 01/03/2015    TDAP status: Up to date  Flu Vaccine status: Due, Education has been provided regarding the importance of this vaccine. Advised may receive this vaccine at local pharmacy or Health Dept. Aware to provide a copy of the vaccination record if obtained from local pharmacy or Health Dept. Verbalized acceptance and understanding.  Pneumococcal  vaccine status: Up to date  Covid-19 vaccine status: Completed vaccines  Qualifies for Shingles Vaccine? Yes   Zostavax completed No   Shingrix Completed?: No.    Education has been provided regarding the importance of this vaccine. Patient has been advised to call insurance company to determine out of pocket expense if they have not yet received this vaccine. Advised may also receive vaccine at local pharmacy or Health Dept. Verbalized acceptance and understanding.  Screening Tests Health Maintenance  Topic Date Due   Zoster Vaccines- Shingrix (1 of 2) Never done   COVID-19 Vaccine (5 - Pfizer risk series) 01/14/2021   INFLUENZA VACCINE  02/18/2022   TETANUS/TDAP  01/02/2025   COLONOSCOPY (Pts 45-51yr Insurance coverage will need to be confirmed)  08/25/2027   Pneumonia Vaccine 75 Years old  Completed   Hepatitis C Screening  Completed   HPV VACCINES  Aged Out    Health Maintenance  Health Maintenance Due  Topic Date Due   Zoster Vaccines- Shingrix (1 of 2) Never done   COVID-19 Vaccine (5 - Pfizer risk series) 01/14/2021   INFLUENZA VACCINE  02/18/2022    Colorectal cancer screening: Type of screening: Colonoscopy. Completed 08/24/2017. Repeat every 10 years  Lung Cancer Screening: (Low Dose CT Chest recommended if Age 75-80years, 30 pack-year currently smoking OR have quit w/in 15years.) does qualify.   Lung Cancer Screening Referral: PROVIDER NOTIFIED  Additional Screening:  Hepatitis C Screening: does qualify; Completed 10/10/2020  Vision Screening: Recommended annual ophthalmology exams for early detection of glaucoma and other disorders of the eye. Is the patient up to date with their annual eye exam?  No  Who is the provider or what is the name of the office in which the patient attends annual eye exams? N/A If pt is not established with a provider, would they like to be referred to a provider to establish care? No .   Dental Screening: Recommended annual  dental exams for proper oral hygiene  Community Resource Referral / Chronic Care Management: CRR required this visit?  No   CCM required this visit?  No      Plan:     I have personally reviewed  and noted the following in the patient's chart:   Medical and social history Use of alcohol, tobacco or illicit drugs  Current medications and supplements including opioid prescriptions. Patient is not currently taking opioid prescriptions. Functional ability and status Nutritional status Physical activity Advanced directives List of other physicians Hospitalizations, surgeries, and ER visits in previous 12 months Vitals Screenings to include cognitive, depression, and falls Referrals and appointments  In addition, I have reviewed and discussed with patient certain preventive protocols, quality metrics, and best practice recommendations. A written personalized care plan for preventive services as well as general preventive health recommendations were provided to patient.     Debbora Dus, [place]   05/02/2022   Nurse Notes: PROVIDER NOTIFIED

## 2022-05-04 ENCOUNTER — Other Ambulatory Visit (HOSPITAL_BASED_OUTPATIENT_CLINIC_OR_DEPARTMENT_OTHER): Payer: Self-pay | Admitting: Family Medicine

## 2022-05-04 DIAGNOSIS — I10 Essential (primary) hypertension: Secondary | ICD-10-CM

## 2022-05-05 NOTE — Telephone Encounter (Signed)
Patient called in an scheduled a f/u with Tye Savoy, NP on Wednesday, 06/04/22 at 10 am.

## 2022-05-08 DIAGNOSIS — Z961 Presence of intraocular lens: Secondary | ICD-10-CM | POA: Diagnosis not present

## 2022-05-08 DIAGNOSIS — H524 Presbyopia: Secondary | ICD-10-CM | POA: Diagnosis not present

## 2022-05-09 DIAGNOSIS — M9901 Segmental and somatic dysfunction of cervical region: Secondary | ICD-10-CM | POA: Diagnosis not present

## 2022-05-09 DIAGNOSIS — M47812 Spondylosis without myelopathy or radiculopathy, cervical region: Secondary | ICD-10-CM | POA: Diagnosis not present

## 2022-05-09 DIAGNOSIS — S39012A Strain of muscle, fascia and tendon of lower back, initial encounter: Secondary | ICD-10-CM | POA: Diagnosis not present

## 2022-05-09 DIAGNOSIS — M9903 Segmental and somatic dysfunction of lumbar region: Secondary | ICD-10-CM | POA: Diagnosis not present

## 2022-05-09 DIAGNOSIS — M9902 Segmental and somatic dysfunction of thoracic region: Secondary | ICD-10-CM | POA: Diagnosis not present

## 2022-05-09 DIAGNOSIS — M47817 Spondylosis without myelopathy or radiculopathy, lumbosacral region: Secondary | ICD-10-CM | POA: Diagnosis not present

## 2022-05-30 DIAGNOSIS — J441 Chronic obstructive pulmonary disease with (acute) exacerbation: Secondary | ICD-10-CM | POA: Diagnosis not present

## 2022-06-04 ENCOUNTER — Ambulatory Visit: Payer: PPO | Admitting: Nurse Practitioner

## 2022-06-04 ENCOUNTER — Encounter: Payer: Self-pay | Admitting: Nurse Practitioner

## 2022-06-04 VITALS — BP 142/82 | HR 63 | Ht 71.0 in | Wt 175.0 lb

## 2022-06-04 DIAGNOSIS — K580 Irritable bowel syndrome with diarrhea: Secondary | ICD-10-CM

## 2022-06-04 DIAGNOSIS — R933 Abnormal findings on diagnostic imaging of other parts of digestive tract: Secondary | ICD-10-CM | POA: Diagnosis not present

## 2022-06-04 DIAGNOSIS — K7469 Other cirrhosis of liver: Secondary | ICD-10-CM

## 2022-06-04 DIAGNOSIS — R140 Abdominal distension (gaseous): Secondary | ICD-10-CM

## 2022-06-04 MED ORDER — FDGARD 25-20.75 MG PO CAPS: ORAL_CAPSULE | ORAL | Status: DC

## 2022-06-04 NOTE — Progress Notes (Unsigned)
Chief Complaint:  abnormal colon on CT scan    Assessment &  Plan   # 76 yo male with history of HCV ( treated in 2014 with negative PCR 2015) and [place] . No evidence for metastatic disease on prior imaging . He is s/p partial left hepatectomy May 2023. No evidence for cirrhosis on resected portion of liver. Follow up CT scan 04/22/22 ordered by Surgery now showing cirrhosis. Additionally there are some new liver lesions ( too small to characterize or biopsy) but no sign of local recurrence at resection site.  Surgery is scheduling him for an surveillance MRI in January AFP 03/06/22 was normal.   No esophageal varices / portal gastropathy on EGD in May 2022   # Abnormal colon on recent CT scan - Left-sided colonic diverticulosis with a short segment of sigmoid colonic wall thickening measuring approximately 9.5 cm in length.  Maybe underdistention of colon ?  He had a colonoscopy in February 2022. He gives a history of IBS and hasn't had any bowel changes. No blood in stool.  Dr. Havery Moros will review CT scan. Hopefully repeat colonoscopy will not be necessary    # Chronic bloating /abdominal distention of unclear etiology. Symptoms worse after eating. Nothing on imaging to explain symptoms. On exam his abdomen is soft, ? mildly distended. Not particularly tympanitic. There does appear to be diastasis recti  He has been working with what sounds like a holistic medicine provider. He has made dietary changes to included low carb foods but so far no improvement in symptoms.  Trial of IBGuard. Samples given  HPI   76 year old male known to Dr. Havery Moros  with a past medical history of suspected postinfectious IBS, COPD, HCV with question of cirrhosis and also [place], chronic loose stools see PMH /PSH for additional history   Patient was seen by Dr. Havery Moros on 12/20/2021, please refer to that note for details, in summary, a new liver mass was noted in March 2023.  He had a biopsy and ultimately  surgical resection for confirmed [place].  There was no evidence for metastatic disease  A liver resection there was no evidence for cirrhosis.  He was being monitored closely by surgery and oncology.  He was scheduled for an MRI in the next 3 months.  He was noted to have a protuberant abdomen without ascites and no clear pathology on ultrasound or MRI.  It was thought that it was just a change in his abdominal wall over time.  Core exercise and strengthening of abdomen were recommended.  He was to follow-up with [place] as needed  Interval History:  Patient is here out of concern for abnormal colon findings on recent CT scan. Also he mentions abdominal bloating.  No ascites or other etiologies to explains bloating on recent CT scan.  The bloating / protuberant abdomen is not new but has gotten worse.  His weight is unchanged. He is not constipated. He doesn't have excessive belching or flatus. Bloating gets worse after meals, gets better during the night. He is eating and drinking fine. No N/V.    He is seeing  "family function doctor" who his chiropractor recommended for the bloating. The provider has him on a low carb diet and also reducing sugar intake. He has been following these suggestions for a couple of weeks ,so far without any improvement in bloating. Patient gives a history of IBS. He has solid BM in the am but then stools get progressively loose. He takes Lomotil  about once a day.    Previous GI Evaluation   Colonoscopy 09/11/20 - 89m rectal polyp, divertculosis, small hemorrhoids, normal ileum - biopsies taken to rule out microscopic colitis  - polyp sessile serrated, normal colon otherwise - repeat colon in 5 years   EGD 12/05/20: - The exam of the esophagus was otherwise normal. No varices. - The entire examined stomach was normal. No varices. - A single small angiodysplastic lesion was found in the second portion of the duodenum. - The duodenal bulb and second portion of the duodenum were  otherwise normal.    [facility] 10/01/21: IMPRESSION: 1. New 3.3 cm hypoechoic mass is noted the left lobe of the liver. Further evaluation with MRI of the abdomen is suggested. 2. No gallstones or biliary distention.    MRI liver 10/04/21: IMPRESSION: 1. Mass in the left hepatic lobe segment 3 as described, consistent with hepatocellular carcinoma, LR 5. 2. Subcentimeter focus of early hyperenhancement in the right hepatic lobe without washout or corresponding signal abnormalities    Liver biopsy 10/24/21: FINAL MICROSCOPIC DIAGNOSIS:   A. LIVER, LEFT LOBE, BIOPSY:  - Moderately differentiated hepatocellular carcinoma, see comment   Had liver resection with Dr. AZenia Resides5/4, discharged on 5/5 Preoperative Diagnosis: Hepatocellular carcinoma Postoperative Diagnosis: Same   Procedure: Staging laparoscopy, laparoscopic partial left hepatectomy (segment 3), intraoperative liver ultrasound   FINAL MICROSCOPIC DIAGNOSIS:   A.   LIVER, LEFT LATERAL, PARTIAL HEPATECTOMY:  -    Hepatocellular carcinoma, grade 2 (moderately differentiated), 2.8  cm in greatest dimension, margin negative for invasive carcinoma.  -    Non-cirrhotic liver with mild macrovesicular steatosis.    Colonoscopy 09/11/20 - 75mrectal polyp, divertculosis, small hemorrhoids, normal ileum - biopsies taken to rule out microscopic colitis  - polyp sessile serrated, normal colon otherwise - repeat colon in 5 years   EGD 12/05/20: - The exam of the esophagus was otherwise normal. No varices. - The entire examined stomach was normal. No varices. - A single small angiodysplastic lesion was found in the second portion of the duodenum. - The duodenal bulb and second portion of the duodenum were otherwise normal.  04/23/22 CT AP with contrast IMPRESSION: 1. Cirrhotic hepatic morphology with postsurgical change of segment III wedge resection without evidence of local recurrence/residual disease. 2. Multiple subcentimeter  bilobar arterially enhancing foci, none of which demonstrate washout or capsule, consistent with LI-RADS 3 hepatic lesions. Suggest follow-up hepatic protocol MRI with and without contrast 3-6 months. 3. Left-sided colonic diverticulosis with a short segment of sigmoid colonic wall thickening measuring approximately 9.5 cm in length, which may reflect sequela of chronic diverticular disease/ Segmental colitis associated with diverticulosis but is incompletely evaluated on this examination. Consider further evaluation with colonoscopy if not recently performed. 4. Mild symmetric wall thickening of a decompressed urinary bladder, which may reflect cystitis. 5.  Aortic Atherosclerosis (ICD10-I70.0).    Labs:     Latest Ref Rng & Units 11/22/2021    3:38 AM 11/21/2021   12:02 PM 10/24/2021   11:25 AM  CBC  WBC 4.0 - 10.5 K/uL 17.6  12.3  9.1   Hemoglobin 13.0 - 17.0 g/dL 14.0  13.9  15.1   Hematocrit 39.0 - 52.0 % 41.6  41.5  42.9   Platelets 150 - 400 K/uL 238  199  211        Latest Ref Rng & Units 11/22/2021    3:38 AM 11/21/2021   12:02 PM 09/23/2021    3:20 PM  Hepatic Function  Total Protein 6.5 - 8.1 g/dL 6.1  6.3  7.5   Albumin 3.5 - 5.0 g/dL 3.3  3.5  4.4   AST 15 - 41 U/L 102  73  23   ALT 0 - 44 U/L 118  74  28   Alk Phosphatase 38 - 126 U/L 38  43  39   Total Bilirubin 0.3 - 1.2 mg/dL 0.8  0.6  0.6      Past Medical History:  Diagnosis Date   Aortic aneurysm (HCC)    Arthritis    BPH (benign prostatic hypertrophy)    Cataract 2015   Cirrhosis (HCC)    COPD (chronic obstructive pulmonary disease) (HCC)    DDD (degenerative disc disease), lumbosacral    Dyspnea    Elevated PSA    Emphysema of lung ([place])    Essential hypertension, benign    diet controlled   Fracture of shaft of clavicle    GERD (gastroesophageal reflux disease)    H/O drug abuse ([place])    multisubstance   Hepatitis B    Hepatitis C 10/1996   HSV-2 (herpes simplex virus 2) infection    Hx of biopsy  08/2004   liver   Hx of colonic polyps 2022   Hyperlipidemia    Inguinal hernia    right   Other and unspecified hyperlipidemia    Prostate cancer ([place])    Substance abuse ([place])    Tuberculosis     Past Surgical History:  Procedure Laterality Date   BACK SURGERY     CERVICAL SPINE SURGERY     EYE SURGERY  3/20   fracture ribs     3   HERNIA REPAIR     INGUINAL HERNIA REPAIR  07/01/2012   Procedure: HERNIA REPAIR INGUINAL ADULT;  Surgeon: Madilyn Hook, DO;  Location: [facility];  Service: General;  Laterality: Right;  with [place]   LAPAROSCOPIC LIVER ULTRASOUND N/A 11/21/2021   Procedure: LAPAROSCOPIC LIVER ULTRASOUND;  Surgeon: Dwan Bolt, MD;  Location: [facility];  Service: General;  Laterality: N/A;   LAPAROSCOPIC PARTIAL HEPATECTOMY Left 11/21/2021   Procedure: LAPAROSCOPIC PARTIAL HEPATECTOMY;  Surgeon: Dwan Bolt, MD;  Location: [facility];  Service: General;  Laterality: Left;   LAPAROSCOPY N/A 11/21/2021   Procedure: STAGING LAPAROSCOPY;  Surgeon: Dwan Bolt, MD;  Location: [facility];  Service: General;  Laterality: N/A;   left knee meniscus repair  07/21/1990   right rotator cuff  07/21/1990   right shoulder arthroscopy  07/21/2009   SPINE SURGERY  10/14    Current Medications, Allergies, Family History and Social History were reviewed in [facility] record.     Current Outpatient Medications  Medication Sig Dispense Refill   albuterol (PROVENTIL) (2.5 MG/3ML) 0.083% nebulizer solution Take 3 mLs (2.5 mg total) by nebulization every 6 (six) hours as needed for wheezing or shortness of breath. 75 mL 5   albuterol (VENTOLIN HFA) 108 (90 Base) MCG/ACT inhaler INHALE 2 PUFFS INTO THE LUNGS EVERY 6 HOURS AS NEEDED FOR SHORTNESS OF BREATH 18 each 1   aspirin EC 81 MG EC tablet Take 1 tablet (81 mg total) by mouth daily.     atorvastatin (LIPITOR) 20 MG tablet      B Complex Vitamins (B COMPLEX PO) Take 1 tablet by mouth daily.     budesonide (ENTOCORT EC) 3  MG 24 hr capsule Take 3 capsules by mouth every morning.     cholecalciferol (VITAMIN  D3) 25 MCG (1000 UT) tablet Take 1,000 Units by mouth daily.     Coenzyme Q10-Vitamin E 100-300 MG-UNIT CHEW Chew 1 tablet by mouth daily.     diazepam (VALIUM) 2 MG tablet Take 1-2 tabs 30 minutes before flight. 10 tablet 0   diazepam (VALIUM) 5 MG tablet TAKE 1 TABLET ONE HOUR PRIOR TO YOUR SCHEDULED MRI     diphenoxylate-atropine (LOMOTIL) 2.5-0.025 MG tablet TAKE 1 TABLET BY MOUTH FOUR TIMES A DAY AS NEEDED FOR DIARRHEA OR LOOSE STOOLS 60 tablet 1   docusate sodium (COLACE) 100 MG capsule Take 1 capsule (100 mg total) by mouth 2 (two) times daily. 10 capsule 0   dutasteride (AVODART) 0.5 MG capsule Take 0.5 mg by mouth daily.     etodolac (LODINE) 300 MG capsule      ezetimibe (ZETIA) 10 MG tablet Take 1 tablet by mouth daily.     famotidine (PEPCID) 20 MG tablet Take 20 mg by mouth every evening.      fluorouracil (EFUDEX) 5 % cream 1 application Externally Twice a day for 14 days     fluticasone (FLONASE) 50 MCG/ACT nasal spray Place 2 sprays into both nostrils daily. 16 g 6   Fluticasone-Umeclidin-Vilant (TRELEGY ELLIPTA) 100-62.5-25 MCG/ACT AEPB INHALE 1 PUFF BY MOUTH EVERY DAY 60 each 5   glucosamine-chondroitin 500-400 MG tablet Take 3 tablets by mouth daily. (Patient taking differently: Take 2 tablets by mouth daily.)     hydrochlorothiazide (MICROZIDE) 12.5 MG capsule TAKE 1 CAPSULE BY MOUTH EVERY DAY 90 capsule 3   losartan (COZAAR) 100 MG tablet Take 1 tablet by mouth daily.     losartan (COZAAR) 100 MG tablet TAKE 1 TABLET BY MOUTH EVERY DAY 90 tablet 0   metaxalone (SKELAXIN) 800 MG tablet TAKE 1 TABLET (800 MG TOTAL) BY MOUTH 3 (THREE) TIMES DAILY AS NEEDED FOR MUSCLE SPASMS. 30 tablet 0   Multiple Vitamins-Minerals (CENTRUM SILVER PO) Take 1 tablet by mouth daily.     mupirocin ointment (BACTROBAN) 2 % 1 application Externally Twice a day for 30 days     olmesartan (BENICAR) 40 MG tablet Take  1 tablet by mouth daily.     Omega-3 Fatty Acids (FISH OIL PO) Take 1 capsule by mouth daily.     oxiconazole (OXISTAT) 1 % CREA      pimecrolimus (ELIDEL) 1 % cream      rosuvastatin (CRESTOR) 5 MG tablet TAKE 1 TABLET (5 MG TOTAL) BY MOUTH DAILY. 90 tablet 0   tamsulosin (FLOMAX) 0.4 MG CAPS capsule Take 0.4 mg by mouth daily.      tiotropium (SPIRIVA HANDIHALER) 18 MCG inhalation capsule      traMADol (ULTRAM) 50 MG tablet Take 1 tablet (50 mg total) by mouth every 6 (six) hours as needed for severe pain. 20 tablet 0   TURMERIC CURCUMIN PO Take 2 capsules by mouth daily.     umeclidinium-vilanterol (ANORO ELLIPTA) 62.5-25 MCG/ACT AEPB Anoro Ellipta 62.5 mcg-25 mcg/actuation powder for inhalation  INHALE 1 PUFF BY MOUTH EVERY DAY     No current facility-administered medications for this visit.    Review of Systems: No chest pain. No shortness of breath. No urinary complaints.    Physical Exam  Wt Readings from Last 3 Encounters:  04/13/22 170 lb (77.1 kg)  04/10/22 173 lb 1.6 oz (78.5 kg)  01/08/22 173 lb 11.2 oz (78.8 kg)    BP (!) 142/82   Pulse 63   Ht _0  (1.803 m)  Wt 175 lb (79.4 kg)   BMI 24.41 kg/m  Constitutional:  Generally well appearing male in no acute distress. Psychiatric: Pleasant. Normal mood and affect. Behavior is normal. EENT: Pupils normal.  Conjunctivae are normal. No scleral icterus. Neck supple.  Cardiovascular: Normal rate, regular rhythm. No edema Pulmonary/chest: Effort normal and breath sounds normal. No wheezing, rales or rhonchi. Abdominal: Soft, nondistended, nontender. Bowel sounds active throughout. There are no masses palpable. Mild diastasis recti.  Neurological: Alert and oriented to person place and time. Skin: Skin is warm and dry. No rashes noted.  I spent 30 minutes total reviewing records, obtaining history, performing exam, counseling patient and documenting visit / findings.   Tye Savoy, NP  06/04/2022, 8:29  AM

## 2022-06-04 NOTE — Patient Instructions (Signed)
We have given you samples of the following medication to take: FDGard.  Take as directed per package instructions.  Finish samples provided.  If you do not see an improvement in your bloating do not purchase any more.  Purchase over the counter if you feel an improvement.   Send an update in a few weeks via MyChart to Tye Savoy RNP  Dr. Havery Moros will review your recent colonoscopy and we will be in touch if he feels a repeat is indicated.

## 2022-06-05 ENCOUNTER — Encounter: Payer: Self-pay | Admitting: Nurse Practitioner

## 2022-06-05 NOTE — Progress Notes (Signed)
Chart reviewed patient with history of HCV and suspected cirrhosis.  Status post resection of [place], followed closely by surgery.  Due for surveillance MRI in January for focal small lesions.  Cirrhosis has been suspected in the past, seen on recent imaging.  He had an EGD to screen for varices last year.  Based on review of chart I think he had a colonoscopy just last year at [facility], prior colonoscopy with [name] showed severe diverticulosis with difficulty navigating the left colon.  Given his recent exam last year I do not think repeat colonoscopy will be high yield here, very likely related to diverticular disease.    He has not had recent labs, would recommend CBC, c-Met, INR, AFP to trend.  Otherwise agree with plan as outlined and await follow-up MRI with surgery.

## 2022-06-10 ENCOUNTER — Telehealth: Payer: Self-pay

## 2022-06-10 DIAGNOSIS — K7469 Other cirrhosis of liver: Secondary | ICD-10-CM

## 2022-06-10 NOTE — Telephone Encounter (Signed)
-----  Message from Willia Craze, NP sent at 06/06/2022  4:19 PM EST ----- George Watson,  Please let him know that I updated Dr. Havery Moros since he hasn't seen him in a while. He reviewed CT scan and agrees that a repeat colonoscopy isn't necessary right now. He would like him to come by for some labs in a week or so, nothing urgent.   CBC, c-Met, INR, AFP  Thanks

## 2022-06-10 NOTE — Telephone Encounter (Signed)
Gave pt message, pt verbalized understanding and had no other concerns at end of call. Lab orders and reminder placed.

## 2022-06-19 ENCOUNTER — Other Ambulatory Visit: Payer: Self-pay | Admitting: Family Medicine

## 2022-06-24 ENCOUNTER — Other Ambulatory Visit (INDEPENDENT_AMBULATORY_CARE_PROVIDER_SITE_OTHER): Payer: PPO

## 2022-06-24 DIAGNOSIS — K7469 Other cirrhosis of liver: Secondary | ICD-10-CM | POA: Diagnosis not present

## 2022-06-24 LAB — CBC WITH DIFFERENTIAL/PLATELET
Basophils Absolute: 0.1 10*3/uL (ref 0.0–0.1)
Basophils Relative: 0.8 % (ref 0.0–3.0)
Eosinophils Absolute: 0.6 10*3/uL (ref 0.0–0.7)
Eosinophils Relative: 7.3 % — ABNORMAL HIGH (ref 0.0–5.0)
HCT: 46.7 % (ref 39.0–52.0)
Hemoglobin: 15.9 g/dL (ref 13.0–17.0)
Lymphocytes Relative: 29.5 % (ref 12.0–46.0)
Lymphs Abs: 2.6 10*3/uL (ref 0.7–4.0)
MCHC: 33.9 g/dL (ref 30.0–36.0)
MCV: 92.3 fl (ref 78.0–100.0)
Monocytes Absolute: 1.2 10*3/uL — ABNORMAL HIGH (ref 0.1–1.0)
Monocytes Relative: 13.5 % — ABNORMAL HIGH (ref 3.0–12.0)
Neutro Abs: 4.2 10*3/uL (ref 1.4–7.7)
Neutrophils Relative %: 48.9 % (ref 43.0–77.0)
Platelets: 252 10*3/uL (ref 150.0–400.0)
RBC: 5.06 Mil/uL (ref 4.22–5.81)
RDW: 13 % (ref 11.5–15.5)
WBC: 8.7 10*3/uL (ref 4.0–10.5)

## 2022-06-24 LAB — COMPREHENSIVE METABOLIC PANEL
ALT: 27 U/L (ref 0–53)
AST: 22 U/L (ref 0–37)
Albumin: 4.5 g/dL (ref 3.5–5.2)
Alkaline Phosphatase: 60 U/L (ref 39–117)
BUN: 24 mg/dL — ABNORMAL HIGH (ref 6–23)
CO2: 25 mEq/L (ref 19–32)
Calcium: 9.8 mg/dL (ref 8.4–10.5)
Chloride: 102 mEq/L (ref 96–112)
Creatinine, Ser: 1.08 mg/dL (ref 0.40–1.50)
GFR: 67.28 mL/min (ref >60.00)
Glucose, Bld: 153 mg/dL — ABNORMAL HIGH (ref 70–99)
Potassium: 4.3 mEq/L (ref 3.5–5.1)
Sodium: 137 mEq/L (ref 135–145)
Total Bilirubin: 0.5 mg/dL (ref 0.2–1.2)
Total Protein: 7.4 g/dL (ref 6.0–8.3)

## 2022-06-24 LAB — PROTIME-INR
INR: 1 ratio (ref 0.8–1.0)
Prothrombin Time: 11.2 s (ref 9.6–13.1)

## 2022-06-25 LAB — AFP TUMOR MARKER: AFP-Tumor Marker: 6.5 ng/mL — ABNORMAL HIGH (ref <6.1)

## 2022-07-02 ENCOUNTER — Encounter (HOSPITAL_BASED_OUTPATIENT_CLINIC_OR_DEPARTMENT_OTHER): Payer: Self-pay | Admitting: Family Medicine

## 2022-07-03 ENCOUNTER — Other Ambulatory Visit (HOSPITAL_BASED_OUTPATIENT_CLINIC_OR_DEPARTMENT_OTHER): Payer: Self-pay

## 2022-07-03 DIAGNOSIS — E785 Hyperlipidemia, unspecified: Secondary | ICD-10-CM

## 2022-07-03 MED ORDER — ROSUVASTATIN CALCIUM 5 MG PO TABS: 5.0000 mg | ORAL_TABLET | Freq: Every day | ORAL | 0 refills | Status: DC

## 2022-07-11 DIAGNOSIS — M9901 Segmental and somatic dysfunction of cervical region: Secondary | ICD-10-CM | POA: Diagnosis not present

## 2022-07-11 DIAGNOSIS — M47812 Spondylosis without myelopathy or radiculopathy, cervical region: Secondary | ICD-10-CM | POA: Diagnosis not present

## 2022-07-11 DIAGNOSIS — M47817 Spondylosis without myelopathy or radiculopathy, lumbosacral region: Secondary | ICD-10-CM | POA: Diagnosis not present

## 2022-07-11 DIAGNOSIS — M9902 Segmental and somatic dysfunction of thoracic region: Secondary | ICD-10-CM | POA: Diagnosis not present

## 2022-07-11 DIAGNOSIS — M9903 Segmental and somatic dysfunction of lumbar region: Secondary | ICD-10-CM | POA: Diagnosis not present

## 2022-07-11 DIAGNOSIS — S39012A Strain of muscle, fascia and tendon of lower back, initial encounter: Secondary | ICD-10-CM | POA: Diagnosis not present

## 2022-07-13 ENCOUNTER — Other Ambulatory Visit: Payer: Self-pay | Admitting: Adult Health

## 2022-07-13 ENCOUNTER — Other Ambulatory Visit: Payer: Self-pay | Admitting: Gastroenterology

## 2022-07-15 ENCOUNTER — Ambulatory Visit (INDEPENDENT_AMBULATORY_CARE_PROVIDER_SITE_OTHER): Payer: PPO | Admitting: Adult Health

## 2022-07-15 ENCOUNTER — Encounter: Payer: Self-pay | Admitting: Adult Health

## 2022-07-15 VITALS — BP 140/70 | HR 69

## 2022-07-15 DIAGNOSIS — J449 Chronic obstructive pulmonary disease, unspecified: Secondary | ICD-10-CM | POA: Diagnosis not present

## 2022-07-15 MED ORDER — TRELEGY ELLIPTA 100-62.5-25 MCG/ACT IN AEPB: INHALATION_SPRAY | RESPIRATORY_TRACT | 5 refills | Status: DC

## 2022-07-15 NOTE — Progress Notes (Signed)
$'@Patient'w$  ID: George Watson, male    DOB: 11/23/1945, 76 y.o.   MRN: [id number]  Chief Complaint  Patient presents with   Follow-up    Referring provider: [place], Blondell Reveal, MD  HPI: 76 year old male former smoker (quit smoking 2004) seen for pulmonary consult December 28, 2019 to establish for COPD History of TB took meds for 1 year in 1990 History of polysubstance abuse with IV drug use, clean since age 14, history of hepatitis C status post interferon and ribavirin, new hep C treatment 2014 at [facility] with eradication Hepatocellular carcinoma status post resection Nov 21, 2021  TEST/EVENTS :  July 2021 PFT showed FEV1 78%, ratio 77, FVC 75%.  No significant bronchodilator response.  Mid flow reversibility.  (Patient did take Trelegy inhaler prior to PFT) 2016 FEV1 53%. CT chest July 10, 2020 showed biapical pleural and parenchymal scarring and bullous changes. CT angio chest aorta December 2021 new nodular changes in the right upper lobe and left lower lobe CT chest August 12, 2021 3 shows pleural densities in the apices with no significant change.,  Few blebs in the apical region, similar linear densities in the bibasilar area suggestive of scarring  07/15/2022 Follow up : COPD  Patient returns for 58-monthfollow-up.  Patient has underlying COPD.  Says overall he is doing well.  Denies any flare of cough or wheezing patient remains on Trelegy inhaler daily.  Needs a refill he gets short of breath with heavy activities.  Denies any increased albuterol use. Chest x-ray September 2023 showed no acute process. Influenza, RSV, COVID booster, Prevnar 13 and Pneumovax are up-to-date. Patient says he remains very active.  Rides his stationary bike for at least 30 minutes each day.  Is active in the community does Meals on Wheels .  Patient says has been dealing with GI issues with bloating.  Is following with GI.     No Active Allergies  Immunization History  Administered Date(s)  Administered   Fluad Quad(high Dose 65+) 03/29/2019, 05/21/2022   Hepatitis A 02/18/1998, 08/21/1998   Influenza Split 04/01/2011, 05/18/2012   Influenza, High Dose Seasonal PF 04/23/2016, 03/17/2018, 04/10/2021   Influenza, Seasonal, Injecte, Preservative Fre 05/01/2017   Influenza,inj,Quad PF,6+ Mos 04/28/2013, 04/21/2014   Influenza-Unspecified 04/21/2015, 05/21/2020   Moderna Covid-19 Vaccine Bivalent Booster 155yr& up 05/21/2022   PFIZER(Purple Top)SARS-COV-2 Vaccination 08/10/2019, 08/31/2019, 05/21/2020, 11/19/2020   Pneumococcal Conjugate-13 08/22/2014   Pneumococcal Polysaccharide-23 07/21/2002, 07/08/2016   Respiratory Syncytial Virus Vaccine,Recomb Aduvanted(Arexvy) 05/21/2022   Tdap 11/18/2005, 01/03/2015    Past Medical History:  Diagnosis Date   Aortic aneurysm (HCC)    Arthritis    BPH (benign prostatic hypertrophy)    Cataract 2015   Cirrhosis (HCC)    COPD (chronic obstructive pulmonary disease) (HCC)    DDD (degenerative disc disease), lumbosacral    Dyspnea    Elevated PSA    Emphysema of lung ([place]   Essential hypertension, benign    diet controlled   Fracture of shaft of clavicle    GERD (gastroesophageal reflux disease)    H/O drug abuse ([place]   multisubstance   Hepatitis B    Hepatitis C 10/1996   HSV-2 (herpes simplex virus 2) infection    Hx of biopsy 08/2004   liver   Hx of colonic polyps 2022   Hyperlipidemia    Inguinal hernia    right   Other and unspecified hyperlipidemia    Prostate cancer ([place]   Substance  abuse ([place])    Tuberculosis     Tobacco History: Social History   Tobacco Use  Smoking Status Former   Packs/day: 1.50   Years: 43.00   Total pack years: 64.50   Types: Cigarettes   Quit date: 02/22/2003   Years since quitting: 19.4  Smokeless Tobacco Never   Counseling given: Not Answered   Outpatient Medications Prior to Visit  Medication Sig Dispense Refill   albuterol (VENTOLIN HFA) 108 (90 Base) MCG/ACT  inhaler INHALE 2 PUFFS INTO THE LUNGS EVERY 6 HOURS AS NEEDED FOR SHORTNESS OF BREATH 18 each 1   aspirin EC 81 MG EC tablet Take 1 tablet (81 mg total) by mouth daily.     B Complex Vitamins (B COMPLEX PO) Take 1 tablet by mouth daily.     [place] Oil-Levomenthol (FDGARD) 25-20.75 MG CAPS Take per package instructions     cholecalciferol (VITAMIN D3) 25 MCG (1000 UT) tablet Take 1,000 Units by mouth daily.     diazepam (VALIUM) 2 MG tablet Take 1-2 tabs 30 minutes before flight. 10 tablet 0   diphenoxylate-atropine (LOMOTIL) 2.5-0.025 MG tablet TAKE 1 TABLET BY MOUTH FOUR TIMES A DAY AS NEEDED FOR DIARRHEA OR LOOSE STOOLS 60 tablet 1   dutasteride (AVODART) 0.5 MG capsule Take 0.5 mg by mouth daily.     famotidine (PEPCID) 20 MG tablet Take 20 mg by mouth every evening.      fluorouracil (EFUDEX) 5 % cream 1 application Externally Twice a day for 14 days     glucosamine-chondroitin 500-400 MG tablet Take 3 tablets by mouth daily. (Patient taking differently: Take 2 tablets by mouth daily.)     hydrochlorothiazide (MICROZIDE) 12.5 MG capsule TAKE 1 CAPSULE BY MOUTH EVERY DAY 90 capsule 3   losartan (COZAAR) 100 MG tablet Take 1 tablet by mouth daily.     methocarbamol (ROBAXIN) 500 MG tablet Take 1 tablet (500 mg total) by mouth every 8 (eight) hours as needed for muscle spasms. 30 tablet 0   Multiple Vitamins-Minerals (CENTRUM SILVER PO) Take 1 tablet by mouth daily.     Omega-3 Fatty Acids (FISH OIL PO) Take 1 capsule by mouth daily.     pimecrolimus (ELIDEL) 1 % cream      rosuvastatin (CRESTOR) 5 MG tablet Take 1 tablet (5 mg total) by mouth daily. 90 tablet 0   tamsulosin (FLOMAX) 0.4 MG CAPS capsule Take 0.4 mg by mouth daily.      TRELEGY ELLIPTA 100-62.5-25 MCG/ACT AEPB INHALE 1 PUFF BY MOUTH EVERY DAY 60 each 5   TURMERIC CURCUMIN PO Take 2 capsules by mouth daily.     albuterol (PROVENTIL) (2.5 MG/3ML) 0.083% nebulizer solution Take 3 mLs (2.5 mg total) by nebulization every 6 (six)  hours as needed for wheezing or shortness of breath. 75 mL 5   Coenzyme Q10-Vitamin E 100-300 MG-UNIT CHEW Chew 1 tablet by mouth daily.     diazepam (VALIUM) 5 MG tablet Take 5 mg by mouth daily as needed (for flying).     Fluticasone-Umeclidin-Vilant (TRELEGY ELLIPTA) 100-62.5-25 MCG/ACT AEPB INHALE 1 PUFF BY MOUTH EVERY DAY 60 each 5   No facility-administered medications prior to visit.     Review of Systems:   Constitutional:   No  weight loss, night sweats,  Fevers, chills,  +fatigue, or  lassitude.  HEENT:   No headaches,  Difficulty swallowing,  Tooth/dental problems, or  Sore throat,  No sneezing, itching, ear ache, nasal congestion, post nasal drip,   CV:  No chest pain,  Orthopnea, PND, swelling in lower extremities, anasarca, dizziness, palpitations, syncope.   GI  No heartburn, indigestion, abdominal pain, nausea, vomiting, diarrhea, change in bowel habits, loss of appetite, bloody stools.   Resp:   No chest wall deformity  Skin: no rash or lesions.  GU: no dysuria, change in color of urine, no urgency or frequency.  No flank pain, no hematuria   MS:  No joint pain or swelling.  No decreased range of motion.  No back pain.    Physical Exam  BP (!) 140/70 (BP Location: Left Arm, Cuff Size: Normal)   Pulse 69   SpO2 96%   GEN: A/Ox3; pleasant , NAD, well nourished    HEENT:  [place]/AT,  EACs-clear, TMs-wnl, NOSE-clear, THROAT-clear, no lesions, no postnasal drip or exudate noted.   NECK:  Supple w/ fair ROM; no JVD; normal carotid impulses w/o bruits; no thyromegaly or nodules palpated; no lymphadenopathy.    RESP  Clear  P & A; w/o, wheezes/ rales/ or rhonchi. no accessory muscle use, no dullness to percussion  CARD:  RRR, no m/r/g, no peripheral edema, pulses intact, no cyanosis or clubbing.  GI:   Soft & nt; nml bowel sounds; no organomegaly or masses detected.   Musco: Warm bil, no deformities or joint swelling noted.   Neuro: alert, no  focal deficits noted.    Skin: Warm, no lesions or rashes    Lab Results:  CBC       Imaging: No results found.       Latest Ref Rng & Units 02/13/2020   10:55 AM  PFT Results  FVC-Pre L 3.44   FVC-Predicted Pre % 73   FVC-Post L 3.54   FVC-Predicted Post % 75   Pre FEV1/FVC % % 76   Post FEV1/FCV % % 77   FEV1-Pre L 2.63   FEV1-Predicted Pre % 76   FEV1-Post L 2.72   DLCO uncorrected ml/min/mmHg 25.56   DLCO UNC% % 93   DLCO corrected ml/min/mmHg 25.56   DLCO COR %Predicted % 93   DLVA Predicted % 109   TLC L 6.11   TLC % Predicted % 82   RV % Predicted % 99     No results found for: "NITRICOXIDE"      Assessment & Plan:   No problem-specific Assessment & Plan notes found for this encounter.     Rexene Edison, NP 07/15/2022

## 2022-07-15 NOTE — Patient Instructions (Addendum)
Continue on TRELEGY 1 puff daily , rinse after use.  Albuterol inhaler or neb as needed  Activity as tolerated.  Follow up in 6 months with Dr. Lamonte Sakai or Acire Tang NP and As needed

## 2022-07-15 NOTE — Assessment & Plan Note (Signed)
COPD appears to be well-controlled.  Continue on triple therapy.  Plan  Patient Instructions  Continue on TRELEGY 1 puff daily , rinse after use.  Albuterol inhaler or neb as needed  Activity as tolerated.  Follow up in 6 months with Dr. Lamonte Sakai or Khiry Pasquariello NP and As needed

## 2022-07-22 ENCOUNTER — Encounter (HOSPITAL_BASED_OUTPATIENT_CLINIC_OR_DEPARTMENT_OTHER): Payer: Self-pay | Admitting: Family Medicine

## 2022-07-22 ENCOUNTER — Other Ambulatory Visit: Payer: Self-pay | Admitting: Surgery

## 2022-07-22 DIAGNOSIS — C220 Liver cell carcinoma: Secondary | ICD-10-CM

## 2022-07-23 DIAGNOSIS — R160 Hepatomegaly, not elsewhere classified: Secondary | ICD-10-CM | POA: Diagnosis not present

## 2022-07-23 DIAGNOSIS — C220 Liver cell carcinoma: Secondary | ICD-10-CM | POA: Diagnosis not present

## 2022-07-24 ENCOUNTER — Encounter: Payer: Self-pay | Admitting: Gastroenterology

## 2022-07-24 ENCOUNTER — Ambulatory Visit (INDEPENDENT_AMBULATORY_CARE_PROVIDER_SITE_OTHER): Payer: PPO | Admitting: Gastroenterology

## 2022-07-24 VITALS — BP 136/64 | HR 72 | Ht 71.0 in | Wt 176.4 lb

## 2022-07-24 DIAGNOSIS — R195 Other fecal abnormalities: Secondary | ICD-10-CM | POA: Insufficient documentation

## 2022-07-24 DIAGNOSIS — R140 Abdominal distension (gaseous): Secondary | ICD-10-CM

## 2022-07-24 NOTE — Progress Notes (Signed)
07/24/2022 George Watson [id number] [birth year]   HISTORY OF PRESENT ILLNESS: This is a 77 year old male who is a patient of Dr. Doyne Keel.  He has cirrhosis from history of hep C that was successfully treated and history of alcohol abuse.  Cirrhosis is overall well compensated, but he did develop Zoar last year and had resection for that.  He is undergoing surveillance imaging.  He continues to focus on his abdominal bloating sensation.  He was told that it was likely due to redistribution of his adipose tissue/fat or change in his abdominal cavity.  Does not have ascites.  His weight has been constant.  He says he saw a functional medicine/holistic doctor and he had a stool study that showed yeast overgrowth.  They treated him with Diflucan and some other medications, which he completed, but his symptoms did not get better.  He wanted to make sure that was nothing for him to be concerned about.  He says the bloating tends to be worse after eating.  He reports loose stools.   Past Medical History:  Diagnosis Date   Aortic aneurysm (HCC)    Arthritis    BPH (benign prostatic hypertrophy)    Cataract 2015   Cirrhosis (HCC)    COPD (chronic obstructive pulmonary disease) (HCC)    DDD (degenerative disc disease), lumbosacral    Dyspnea    Elevated PSA    Emphysema of lung (HCC)    Essential hypertension, benign    diet controlled   Fracture of shaft of clavicle    GERD (gastroesophageal reflux disease)    H/O drug abuse ([place])    multisubstance   Hepatitis B    Hepatitis C 10/1996   HSV-2 (herpes simplex virus 2) infection    Hx of biopsy 08/2004   liver   Hx of colonic polyps 2022   Hyperlipidemia    Inguinal hernia    right   Other and unspecified hyperlipidemia    Prostate cancer ([place])    Substance abuse ([place])    Tuberculosis    Past Surgical History:  Procedure Laterality Date   BACK SURGERY     CERVICAL SPINE SURGERY     EYE SURGERY  3/20   fracture ribs     3    HERNIA REPAIR     INGUINAL HERNIA REPAIR  07/01/2012   Procedure: HERNIA REPAIR INGUINAL ADULT;  Surgeon: Madilyn Hook, DO;  Location: [facility];  Service: General;  Laterality: Right;  with Mesh   LAPAROSCOPIC LIVER ULTRASOUND N/A 11/21/2021   Procedure: LAPAROSCOPIC LIVER ULTRASOUND;  Surgeon: Dwan Bolt, MD;  Location: [place];  Service: General;  Laterality: N/A;   LAPAROSCOPIC PARTIAL HEPATECTOMY Left 11/21/2021   Procedure: LAPAROSCOPIC PARTIAL HEPATECTOMY;  Surgeon: Dwan Bolt, MD;  Location: [facility];  Service: General;  Laterality: Left;   LAPAROSCOPY N/A 11/21/2021   Procedure: STAGING LAPAROSCOPY;  Surgeon: Dwan Bolt, MD;  Location: [facility];  Service: General;  Laterality: N/A;   left knee meniscus repair  07/21/1990   right rotator cuff  07/21/1990   right shoulder arthroscopy  07/21/2009   SPINE SURGERY  10/14    reports that he quit smoking about 19 years ago. His smoking use included cigarettes. He has a 64.50 pack-year smoking history. He has never used smokeless tobacco. He reports that he does not currently use alcohol. He reports that he does not currently use drugs after having used the following drugs: Cocaine and Heroin. family history  includes Breast cancer in his sister; Cancer in his father and sister; Esophageal cancer in his father; Leukemia in his mother; Melanoma in his brother; Prostate cancer in his brother; Throat cancer in his father. No Known Allergies    Outpatient Encounter Medications as of 07/24/2022  Medication Sig   albuterol (VENTOLIN HFA) 108 (90 Base) MCG/ACT inhaler INHALE 2 PUFFS INTO THE LUNGS EVERY 6 HOURS AS NEEDED FOR SHORTNESS OF BREATH   aspirin EC 81 MG EC tablet Take 1 tablet (81 mg total) by mouth daily.   B Complex Vitamins (B COMPLEX PO) Take 1 tablet by mouth daily.   Caraway Oil-Levomenthol (FDGARD) 25-20.75 MG CAPS Take per package instructions   cholecalciferol (VITAMIN D3) 25 MCG (1000 UT) tablet Take 1,000 Units by mouth daily.    diazepam (VALIUM) 2 MG tablet Take 1-2 tabs 30 minutes before flight.   diphenoxylate-atropine (LOMOTIL) 2.5-0.025 MG tablet TAKE 1 TABLET BY MOUTH FOUR TIMES A DAY AS NEEDED FOR DIARRHEA OR LOOSE STOOLS   dutasteride (AVODART) 0.5 MG capsule Take 0.5 mg by mouth daily.   famotidine (PEPCID) 20 MG tablet Take 20 mg by mouth every evening.    fluorouracil (EFUDEX) 5 % cream 1 application Externally Twice a day for 14 days   Fluticasone-Umeclidin-Vilant (TRELEGY ELLIPTA) 100-62.5-25 MCG/ACT AEPB INHALE 1 PUFF BY MOUTH EVERY DAY   glucosamine-chondroitin 500-400 MG tablet Take 3 tablets by mouth daily. (Patient taking differently: Take 2 tablets by mouth daily.)   hydrochlorothiazide (MICROZIDE) 12.5 MG capsule TAKE 1 CAPSULE BY MOUTH EVERY DAY   losartan (COZAAR) 100 MG tablet Take 1 tablet by mouth daily.   methocarbamol (ROBAXIN) 500 MG tablet Take 1 tablet (500 mg total) by mouth every 8 (eight) hours as needed for muscle spasms.   Multiple Vitamins-Minerals (CENTRUM SILVER PO) Take 1 tablet by mouth daily.   Omega-3 Fatty Acids (FISH OIL PO) Take 1 capsule by mouth daily.   pimecrolimus (ELIDEL) 1 % cream    rosuvastatin (CRESTOR) 5 MG tablet Take 1 tablet (5 mg total) by mouth daily.   tamsulosin (FLOMAX) 0.4 MG CAPS capsule Take 0.4 mg by mouth daily.    TURMERIC CURCUMIN PO Take 2 capsules by mouth daily.   albuterol (PROVENTIL) (2.5 MG/3ML) 0.083% nebulizer solution Take 3 mLs (2.5 mg total) by nebulization every 6 (six) hours as needed for wheezing or shortness of breath.   Coenzyme Q10-Vitamin E 100-300 MG-UNIT CHEW Chew 1 tablet by mouth daily.   diazepam (VALIUM) 5 MG tablet Take 5 mg by mouth daily as needed (for flying).   No facility-administered encounter medications on file as of 07/24/2022.     REVIEW OF SYSTEMS  : All other systems reviewed and negative except where noted in the History of Present Illness.   PHYSICAL EXAM: BP 136/64   Pulse 72   Ht _0  (1.803 m)   Wt  176 lb 6 oz (80 kg)   BMI 24.60 kg/m  General: Well developed white male in no acute distress Head: Normocephalic and atraumatic Eyes:  Sclerae anicteric, conjunctiva pink. Ears: Normal auditory acuity Lungs: Clear throughout to auscultation; no W/R/R. Heart: Regular rate and rhythm; no M/R/G. Abdomen: Soft, non-distended.  BS present.  Non-tender.  Scar noted on mid-abdomen with associated hernia/diastasis recti. Musculoskeletal: Symmetrical with no gross deformities  Skin: No lesions on visible extremities Extremities: No edema  Neurological: Alert oriented x 4, grossly non-focal Psychological:  Alert and cooperative. Normal mood and affect  ASSESSMENT AND PLAN: *77 year old  male with complaints of bloating.  Seems to get worse after eating.  He was told that is likely due to redistribution of his adipose tissue/fat and changes in abdominal cavity.  He also reports loose stools.  Will check for SIBO with breath test.  He was given the kit today.  Pending those results if it is positive we will treat, if it is negative and we discussed maybe a trial of Florastor probiotic and FODMAP diet.  CC:  [place], Blondell Reveal, MD

## 2022-07-24 NOTE — Progress Notes (Signed)
Agree with assessment and plan as outlined. Yeast is not causing his symptoms, especially if antifungal did not help. Can test for SIBO and see what that shows. I would reassure him however I don't think anything concerning. Colonoscopy UTD. Dr. Zenia Resides ordered him an MRI to be done this month for his [facility] and he needs to follow up and get that done to ensure stable changes. Thanks

## 2022-07-24 NOTE — Patient Instructions (Signed)
_______________________________________________________  If you are age 76 or older, your body mass index should be between 23-30. Your Body mass index is 24.6 kg/m. If this is out of the aforementioned range listed, please consider follow up with your Primary Care Provider.  If you are age 78 or younger, your body mass index should be between 19-25. Your Body mass index is 24.6 kg/m. If this is out of the aformentioned range listed, please consider follow up with your Primary Care Provider.   ________________________________________________________  The [place] [facility] providers would like to encourage you to use [facility] to communicate with providers for non-urgent requests or questions.  Due to long hold times on the telephone, sending your provider a message by [facility] may be a faster and more efficient way to get a response.  Please allow 48 business hours for a response.  Please remember that this is for non-urgent requests.  _______________________________________________________   George Watson have been given a testing kit to check for small intestine bacterial overgrowth (SIBO) which is completed by a company named [facility]. Make sure to return your test in the mail using the return mailing label given to you along with the kit. Your demographic and insurance information have already been sent to the company and they should be in contact with you over the next 1-2 weeks regarding this test. [facility] will collect an upfront charge of $99.74 for commercial insurance plans and $209.74 is you are paying cash. Make sure to discuss with [facility] PRIOR to having the test to see if they have gotten information from your insurance company as to how much your testing will cost out of pocket, if any. Please keep in mind that you will be getting a call from phone number [phone] or a similar number. If you do not hear from them within this time frame, please call our office at [phone] or  call [facility] directly at [phone].

## 2022-07-25 DIAGNOSIS — S39012A Strain of muscle, fascia and tendon of lower back, initial encounter: Secondary | ICD-10-CM | POA: Diagnosis not present

## 2022-07-25 DIAGNOSIS — M9903 Segmental and somatic dysfunction of lumbar region: Secondary | ICD-10-CM | POA: Diagnosis not present

## 2022-07-25 DIAGNOSIS — M9901 Segmental and somatic dysfunction of cervical region: Secondary | ICD-10-CM | POA: Diagnosis not present

## 2022-07-25 DIAGNOSIS — M9902 Segmental and somatic dysfunction of thoracic region: Secondary | ICD-10-CM | POA: Diagnosis not present

## 2022-07-25 DIAGNOSIS — M47812 Spondylosis without myelopathy or radiculopathy, cervical region: Secondary | ICD-10-CM | POA: Diagnosis not present

## 2022-07-25 DIAGNOSIS — M47817 Spondylosis without myelopathy or radiculopathy, lumbosacral region: Secondary | ICD-10-CM | POA: Diagnosis not present

## 2022-07-27 ENCOUNTER — Encounter (HOSPITAL_BASED_OUTPATIENT_CLINIC_OR_DEPARTMENT_OTHER): Payer: Self-pay | Admitting: Family Medicine

## 2022-07-28 ENCOUNTER — Encounter (HOSPITAL_BASED_OUTPATIENT_CLINIC_OR_DEPARTMENT_OTHER): Payer: Self-pay | Admitting: Family Medicine

## 2022-07-28 ENCOUNTER — Ambulatory Visit (INDEPENDENT_AMBULATORY_CARE_PROVIDER_SITE_OTHER): Payer: PPO | Admitting: Family Medicine

## 2022-07-28 ENCOUNTER — Ambulatory Visit (HOSPITAL_COMMUNITY): Payer: Self-pay

## 2022-07-28 VITALS — BP 152/73 | HR 60 | Ht 71.0 in | Wt 176.5 lb

## 2022-07-28 DIAGNOSIS — Z8546 Personal history of malignant neoplasm of prostate: Secondary | ICD-10-CM | POA: Diagnosis not present

## 2022-07-28 DIAGNOSIS — M542 Cervicalgia: Secondary | ICD-10-CM | POA: Diagnosis not present

## 2022-07-28 NOTE — Assessment & Plan Note (Addendum)
Patient reports that about 5 days ago he was putting away Christmas decorations in his attic and this involved lifting overhead.  While he was doing this, he developed pain along the right side of neck extending down to right shoulder.  Pain is still present at this time, however he feels that it has improved some since it initially started.  He has been utilizing heat, OTC medications, muscle relaxer.  Feels that heat has been most helpful in regards to symptom control.  He has not had any associated numbness or tingling.  No radiation of pain past the shoulder. On exam, patient is in no acute distress, vital signs stable.  No significant tenderness to palpation along cervical spinous processes.  No significant tenderness to palpation through right-sided paraspinal muscles or extending down towards shoulder.  Mild discomfort with empty can, Hawkins, negative Neer's.  Distal neurovascular exam intact. Suspect that current symptoms are related to mild strain.  Discussed options with patient, we will proceed with referral to physical therapy.  He can continue with over-the-counter medications, discussed consideration of OTC Voltaren gel, can continue with topical heat. He does have follow-up upcoming in the next few weeks, we can plan to monitor progress at that follow-up visit.  If symptoms are persisting, consider proceeding with initial imaging with cervical spine x-rays.

## 2022-07-28 NOTE — Progress Notes (Signed)
    Procedures performed today:    None.  Independent interpretation of notes and tests performed by another provider:   None.  Brief History, Exam, Impression, and Recommendations:    BP (!) 152/73 (BP Location: Left Arm, Patient Position: Sitting, Cuff Size: Large)   Pulse 60   Ht '5\' 11"'$  (1.803 m)   Wt 176 lb 8 oz (80.1 kg)   SpO2 99%   BMI 24.62 kg/m   Cervicalgia Patient reports that about 5 days ago he was putting away Christmas decorations in his attic and this involved lifting overhead.  While he was doing this, he developed pain along the right side of neck extending down to right shoulder.  Pain is still present at this time, however he feels that it has improved some since it initially started.  He has been utilizing heat, OTC medications, muscle relaxer.  Feels that heat has been most helpful in regards to symptom control.  He has not had any associated numbness or tingling.  No radiation of pain past the shoulder. On exam, patient is in no acute distress, vital signs stable.  No significant tenderness to palpation along cervical spinous processes.  No significant tenderness to palpation through right-sided paraspinal muscles or extending down towards shoulder.  Mild discomfort with empty can, Hawkins, negative Neer's.  Distal neurovascular exam intact. Suspect that current symptoms are related to mild strain.  Discussed options with patient, we will proceed with referral to physical therapy.  He can continue with over-the-counter medications, discussed consideration of OTC Voltaren gel, can continue with topical heat. He does have follow-up upcoming in the next few weeks, we can plan to monitor progress at that follow-up visit.  If symptoms are persisting, consider proceeding with initial imaging with cervical spine x-rays.   ___________________________________________ Arelly Whittenberg de [place], MD, ABFM, CA[facility] [place]

## 2022-08-04 ENCOUNTER — Ambulatory Visit (HOSPITAL_BASED_OUTPATIENT_CLINIC_OR_DEPARTMENT_OTHER): Payer: PPO

## 2022-08-06 DIAGNOSIS — C220 Liver cell carcinoma: Secondary | ICD-10-CM | POA: Diagnosis not present

## 2022-08-07 LAB — HEMOGLOBIN A1C
Est. average glucose Bld gHb Est-mCnc: 134 mg/dL
Hgb A1c MFr Bld: 6.3 % — ABNORMAL HIGH (ref 4.8–5.6)

## 2022-08-07 LAB — LIPID PANEL
Chol/HDL Ratio: 4.4 ratio (ref 0.0–5.0)
Cholesterol, Total: 153 mg/dL (ref 100–199)
HDL: 35 mg/dL — ABNORMAL LOW (ref >39)
LDL Chol Calc (NIH): 83 mg/dL (ref 0–99)
Triglycerides: 207 mg/dL — ABNORMAL HIGH (ref 0–149)
VLDL Cholesterol Cal: 35 mg/dL (ref 5–40)

## 2022-08-08 ENCOUNTER — Ambulatory Visit (HOSPITAL_COMMUNITY)
Admission: RE | Admit: 2022-08-08 | Discharge: 2022-08-08 | Disposition: A | Discharge status: Home / Self Care | Payer: PPO | Source: Ambulatory Visit | Attending: Family Medicine | Admitting: Family Medicine

## 2022-08-08 ENCOUNTER — Encounter (HOSPITAL_COMMUNITY): Payer: Self-pay

## 2022-08-08 VITALS — BP 153/84 | HR 66 | Temp 97.5°F | Resp 18

## 2022-08-08 DIAGNOSIS — J441 Chronic obstructive pulmonary disease with (acute) exacerbation: Secondary | ICD-10-CM

## 2022-08-08 DIAGNOSIS — H9202 Otalgia, left ear: Secondary | ICD-10-CM | POA: Diagnosis not present

## 2022-08-08 MED ORDER — DOXYCYCLINE HYCLATE 100 MG PO CAPS: 100.0000 mg | ORAL_CAPSULE | Freq: Two times a day (BID) | ORAL | 0 refills | Status: DC

## 2022-08-08 MED ORDER — PREDNISONE 50 MG PO TABS: 50.0000 mg | ORAL_TABLET | Freq: Every day | ORAL | 0 refills | Status: AC

## 2022-08-08 MED ORDER — IPRATROPIUM-ALBUTEROL 0.5-2.5 (3) MG/3ML IN SOLN
RESPIRATORY_TRACT | Status: AC
  Filled 2022-08-08: qty 3

## 2022-08-08 MED ORDER — IPRATROPIUM-ALBUTEROL 0.5-2.5 (3) MG/3ML IN SOLN
3.0000 mL | Freq: Once | RESPIRATORY_TRACT | Status: AC
  Administered 2022-08-08: 3 mL via RESPIRATORY_TRACT

## 2022-08-08 NOTE — Discharge Instructions (Signed)
You were seen today for upper respiratory symptoms.  Your ear overall looks okay, with a bit of fluid.  I recommend you take over the counter claritin or zyrtec once daily to see if helpful.  I have sent out oral prednisone and an antibiotic to take twice/day x 10 days for your COPD exacerbation.  Please increase you albuterol inhaler usage to every 4-6hrs/as needed for cough and wheezing.  If you have worsening cough, wheezing or shortness of breath please return or go to the ER for evaluation.

## 2022-08-08 NOTE — ED Triage Notes (Signed)
Pt states that he has had left ear pain X 2 days. He has started coughing and having some nasal congestion. He has been taking IBU

## 2022-08-08 NOTE — ED Provider Notes (Signed)
[place]    CSN: [id number] Arrival date & time: 08/08/22  2671      History   Chief Complaint Chief Complaint  Patient presents with   Otalgia   Nasal Congestion    HPI George Watson is a 77 y.o. male.   He started with left ear pain about 2 days ago.  He thinks this may be due to being in a pool.  His primary gave him a spray to use into the ear in October for clogged ear.  He restarted this about 4 weeks ago when starting to feel clogged again.  Was bad 2 days ago.  He then started with cough, runny nose, congestion, difficultly breathing, wheezing 2 days ago.  He does have copd.  He uses albuterol and trelegy.  He has not increased the albuterol yet.  No fevers/chills.   He used motrin only for his symptoms.        Past Medical History:  Diagnosis Date   Aortic aneurysm (HCC)    Arthritis    BPH (benign prostatic hypertrophy)    Cataract 2015   Cirrhosis (HCC)    COPD (chronic obstructive pulmonary disease) (HCC)    DDD (degenerative disc disease), lumbosacral    Dyspnea    Elevated PSA    Emphysema of lung (HCC)    Essential hypertension, benign    diet controlled   Fracture of shaft of clavicle    GERD (gastroesophageal reflux disease)    H/O drug abuse ([place])    multisubstance   Hepatitis B    Hepatitis C 10/1996   HSV-2 (herpes simplex virus 2) infection    Hx of biopsy 08/2004   liver   Hx of colonic polyps 2022   Hyperlipidemia    Inguinal hernia    right   Other and unspecified hyperlipidemia    Prostate cancer ([place])    Substance abuse ([place])    Tuberculosis     Patient Active Problem List   Diagnosis Date Noted   Cervicalgia 07/28/2022   Loose stools 07/24/2022   Bruising 02/06/2022   Hepatocellular carcinoma ([place]) 10/14/2021   Bloating 09/17/2021   COVID-19 08/13/2021   Lung nodule 10/05/2020   Gastroenteritis 05/08/2020   TIA (transient ischemic attack) 07/07/2019   Dilated aortic root ([place]) 06/13/2019   Pure  hypertriglyceridemia 04/08/2019   Fear of flying 02/11/2018   Tear of right rotator cuff 08/31/2017   Wellness examination 01/07/2017   Prediabetes 01/06/2017   Atherosclerosis of aorta ([place]) 07/09/2016   Degeneration of lumbar or lumbosacral intervertebral disc 02/15/2014   Essential hypertension, benign    Hyperlipidemia with target LDL less than 70    HSV-2 (herpes simplex virus 2) infection    COPD (chronic obstructive pulmonary disease) (HCC)    Benign prostatic hyperplasia    DDD (degenerative disc disease), lumbosacral    Prostate cancer ([place])    Cirrhosis ([place]) 05/21/1997   Hepatitis C, chronic ([place]) 10/19/1996    Past Surgical History:  Procedure Laterality Date   BACK SURGERY     CERVICAL SPINE SURGERY     EYE SURGERY  3/20   fracture ribs     3   HERNIA REPAIR     INGUINAL HERNIA REPAIR  07/01/2012   Procedure: HERNIA REPAIR INGUINAL ADULT;  Surgeon: Madilyn Hook, DO;  Location: [facility];  Service: General;  Laterality: Right;  with Mesh   LAPAROSCOPIC LIVER ULTRASOUND N/A 11/21/2021   Procedure: LAPAROSCOPIC LIVER ULTRASOUND;  Surgeon: Zenia Resides,  Blanchard Mane, MD;  Location: [facility];  Service: General;  Laterality: N/A;   LAPAROSCOPIC PARTIAL HEPATECTOMY Left 11/21/2021   Procedure: LAPAROSCOPIC PARTIAL HEPATECTOMY;  Surgeon: Dwan Bolt, MD;  Location: [facility];  Service: General;  Laterality: Left;   LAPAROSCOPY N/A 11/21/2021   Procedure: STAGING LAPAROSCOPY;  Surgeon: Dwan Bolt, MD;  Location: [place];  Service: General;  Laterality: N/A;   left knee meniscus repair  07/21/1990   right rotator cuff  07/21/1990   right shoulder arthroscopy  07/21/2009   SPINE SURGERY  10/14       Home Medications    Prior to Admission medications   Medication Sig Start Date End Date Taking? Authorizing Provider  aspirin EC 81 MG EC tablet Take 1 tablet (81 mg total) by mouth daily. 07/09/19  Yes Black, Lezlie Octave, NP  B Complex Vitamins (B COMPLEX PO) Take 1 tablet by mouth daily.   Yes  [provider]  [place] Oil-Levomenthol (FDGARD) 25-20.75 MG CAPS Take per package instructions 06/04/22  Yes Willia Craze, NP  cholecalciferol (VITAMIN D3) 25 MCG (1000 UT) tablet Take 1,000 Units by mouth daily.   Yes [provider]  diphenoxylate-atropine (LOMOTIL) 2.5-0.025 MG tablet TAKE 1 TABLET BY MOUTH FOUR TIMES A DAY AS NEEDED FOR DIARRHEA OR LOOSE STOOLS 07/15/22  Yes Armbruster, Carlota Raspberry, MD  dutasteride (AVODART) 0.5 MG capsule Take 0.5 mg by mouth daily.   Yes [provider]  fluorouracil (EFUDEX) 5 % cream 1 application Externally Twice a day for 14 days 03/06/22  Yes [provider]  Fluticasone-Umeclidin-Vilant (TRELEGY ELLIPTA) 100-62.5-25 MCG/ACT AEPB INHALE 1 PUFF BY MOUTH EVERY DAY 07/15/22  Yes Parrett, Tammy S, NP  glucosamine-chondroitin 500-400 MG tablet Take 3 tablets by mouth daily. Patient taking differently: Take 2 tablets by mouth daily. 02/15/18  Yes Marrian Salvage, FNP  hydrochlorothiazide (MICROZIDE) 12.5 MG capsule TAKE 1 CAPSULE BY MOUTH EVERY DAY 11/08/21  Yes de [place], Raymond J, MD  losartan (COZAAR) 100 MG tablet Take 1 tablet by mouth daily.   Yes [provider]  Multiple Vitamins-Minerals (CENTRUM SILVER PO) Take 1 tablet by mouth daily.   Yes [provider]  Omega-3 Fatty Acids (FISH OIL PO) Take 1 capsule by mouth daily.   Yes [provider]  pimecrolimus (ELIDEL) 1 % cream    Yes [provider]  rosuvastatin (CRESTOR) 5 MG tablet Take 1 tablet (5 mg total) by mouth daily. 07/03/22  Yes de [place], Raymond J, MD  tamsulosin (FLOMAX) 0.4 MG CAPS capsule Take 0.4 mg by mouth daily.    Yes [provider]  TURMERIC CURCUMIN PO Take 2 capsules by mouth daily.   Yes [provider]  albuterol (PROVENTIL) (2.5 MG/3ML) 0.083% nebulizer solution Take 3 mLs (2.5 mg total) by nebulization every 6 (six) hours as needed for wheezing or shortness of breath. 05/29/21  06/04/22  Parrett, Fonnie Mu, NP  albuterol (VENTOLIN HFA) 108 (90 Base) MCG/ACT inhaler INHALE 2 PUFFS INTO THE LUNGS EVERY 6 HOURS AS NEEDED FOR SHORTNESS OF BREATH 01/09/22   de [place], Blondell Reveal, MD  diazepam (VALIUM) 2 MG tablet Take 1-2 tabs 30 minutes before flight. 07/23/21   de [place], Raymond J, MD  famotidine (PEPCID) 20 MG tablet Take 20 mg by mouth every evening.     [provider]  methocarbamol (ROBAXIN) 500 MG tablet Take 1 tablet (500 mg total) by mouth every 8 (eight) hours as needed for muscle spasms.  06/23/22   de [place], Blondell Reveal, MD    Family History Family History  Problem Relation Age of Onset   Leukemia Mother    Throat cancer Father    Esophageal cancer Father    Cancer Father        esophageal cancer   Breast cancer Sister    Cancer Sister    Prostate cancer Brother    Melanoma Brother    Colon cancer Neg Hx    Stomach cancer Neg Hx    Pancreatic cancer Neg Hx    Liver disease Neg Hx     Social History Social History   Tobacco Use   Smoking status: Former    Packs/day: 1.50    Years: 43.00    Total pack years: 64.50    Types: Cigarettes    Quit date: 02/22/2003    Years since quitting: 19.4   Smokeless tobacco: Never  Vaping Use   Vaping Use: Never used  Substance Use Topics   Alcohol use: Not Currently    Comment: 1988   Drug use: Not Currently    Types: Cocaine, Heroin    Comment: 26 years ago cocaine, heroin. methadone     Allergies   Patient has no known allergies.   Review of Systems Review of Systems  Constitutional:  Negative for fatigue and fever.  HENT:  Positive for congestion, ear pain and voice change.   Respiratory:  Positive for cough, shortness of breath and wheezing.   Gastrointestinal: Negative.   Musculoskeletal: Negative.   Psychiatric/Behavioral: Negative.       Physical Exam Triage Vital Signs ED Triage Vitals  Enc Vitals Group     BP 08/08/22 0825 (!) 153/84     Pulse Rate 08/08/22 0825 66     Resp  08/08/22 0825 18     Temp 08/08/22 0825 (!) 97.5 F (36.4 C)     Temp Source 08/08/22 0825 Oral     SpO2 08/08/22 0825 95 %     Weight --      Height --      Head Circumference --      Peak Flow --      Pain Score 08/08/22 0823 7     Pain Loc --      Pain Edu? --      Excl. in [place]? --    No data found.  Updated Vital Signs BP (!) 153/84 (BP Location: Left Arm)   Pulse 66   Temp (!) 97.5 F (36.4 C) (Oral)   Resp 18   SpO2 95%   Visual Acuity Right Eye Distance:   Left Eye Distance:   Bilateral Distance:    Right Eye Near:   Left Eye Near:    Bilateral Near:     Physical Exam Constitutional:      General: He is not in acute distress.    Appearance: Normal appearance. He is not ill-appearing.  HENT:     Right Ear: There is no impacted cerumen. Tympanic membrane is injected. Tympanic membrane is not erythematous or bulging.     Left Ear: There is no impacted cerumen. Tympanic membrane is injected and bulging. Tympanic membrane is not erythematous.     Mouth/Throat:     Mouth: Mucous membranes are moist.  Cardiovascular:     Rate and Rhythm: Normal rate and regular rhythm.  Pulmonary:     Effort: Pulmonary effort is normal.     Breath sounds: Wheezing and rhonchi present.  Musculoskeletal:  Cervical back: Normal range of motion and neck supple. No tenderness.  Skin:    General: Skin is warm.  Neurological:     General: No focal deficit present.     Mental Status: He is alert.  Psychiatric:        Mood and Affect: Mood normal.     UC Treatments / Results  Labs (all labs ordered are listed, but only abnormal results are displayed) Labs Reviewed - No data to display  EKG   Radiology No results found.  Procedures Procedures (including critical care time)  Medications Ordered in UC Medications  ipratropium-albuterol (DUONEB) 0.5-2.5 (3) MG/3ML nebulizer solution 3 mL (3 mLs Nebulization Given 08/08/22 0843)    Initial Impression / Assessment and  Plan / UC Course  I have reviewed the triage vital signs and the nursing notes.  Pertinent labs & imaging results that were available during my care of the patient were reviewed by me and considered in my medical decision making (see chart for details).    Final Clinical Impressions(s) / UC Diagnoses   Final diagnoses:  Left ear pain  COPD exacerbation [facility])     Discharge Instructions      You were seen today for upper respiratory symptoms.  Your ear overall looks okay, with a bit of fluid.  I recommend you take over the counter claritin or zyrtec once daily to see if helpful.  I have sent out oral prednisone and an antibiotic to take twice/day x 10 days for your COPD exacerbation.  Please increase you albuterol inhaler usage to every 4-6hrs/as needed for cough and wheezing.  If you have worsening cough, wheezing or shortness of breath please return or go to the ER for evaluation.     ED Prescriptions     Medication Sig Dispense Auth. Provider   doxycycline (VIBRAMYCIN) 100 MG capsule Take 1 capsule (100 mg total) by mouth 2 (two) times daily. 20 capsule Iren Whipp, MD   predniSONE (DELTASONE) 50 MG tablet Take 1 tablet (50 mg total) by mouth daily for 5 days. 5 tablet Rondel Oh, MD      PDMP not reviewed this encounter.   Rondel Oh, MD 08/08/22 [phone]

## 2022-08-11 ENCOUNTER — Ambulatory Visit (INDEPENDENT_AMBULATORY_CARE_PROVIDER_SITE_OTHER): Payer: PPO | Admitting: Family Medicine

## 2022-08-11 ENCOUNTER — Encounter (HOSPITAL_BASED_OUTPATIENT_CLINIC_OR_DEPARTMENT_OTHER): Payer: Self-pay | Admitting: Family Medicine

## 2022-08-11 VITALS — BP 152/80 | HR 74 | Ht 71.0 in | Wt 178.1 lb

## 2022-08-11 DIAGNOSIS — I10 Essential (primary) hypertension: Secondary | ICD-10-CM | POA: Diagnosis not present

## 2022-08-11 DIAGNOSIS — R7303 Prediabetes: Secondary | ICD-10-CM | POA: Diagnosis not present

## 2022-08-11 DIAGNOSIS — E785 Hyperlipidemia, unspecified: Secondary | ICD-10-CM | POA: Diagnosis not present

## 2022-08-11 DIAGNOSIS — M5137 Other intervertebral disc degeneration, lumbosacral region: Secondary | ICD-10-CM

## 2022-08-11 MED ORDER — LOSARTAN POTASSIUM 100 MG PO TABS: 100.0000 mg | ORAL_TABLET | Freq: Every day | ORAL | 0 refills | Status: DC

## 2022-08-11 MED ORDER — METHOCARBAMOL 500 MG PO TABS: 500.0000 mg | ORAL_TABLET | Freq: Three times a day (TID) | ORAL | 0 refills | Status: DC | PRN

## 2022-08-11 NOTE — Progress Notes (Signed)
Established Patient Office Visit  Subjective   Patient ID: George Watson, male    DOB: September 09, 1945  Age: 77 y.o. MRN: [id number]  Chief Complaint  Patient presents with   Follow-up    Pt here for f/u on HTN, HLD and HTN     HPI  Hypertension Medication compliance: losartan and HCTZ, taking daily Denies chest pain, shortness of breath, lower extremity edema, vision changes, headaches. Pertinent lab work: 12/5: GFR 67 Monitoring: does not monitoring, recommend 3-4 times per week Tolerating medication well: denies side effects Continue current medication regimen: losatan and HCTZ Follow-up: Q 3 months  Hyperlipidemia:  Medication compliance: on rosuvastatin 5 mg,  Denies chest pain, shortness of breath, lower extremity edema, myalgias, muscle weakness, changes in appearance of urine. Pertinent lab work: triglycerides slightly increased to 207 Monitoring: Q 3 months in office Continue current medication regimen: rosuvastatin 5 mg Follow-up: Q 3 months.   Diabetes: Medication compliance: not currently on medications.  Denies chest pain, shortness of breath, vision changes, polydipsia, polyphagia, polyuria.  Pertinent lab work: A1C: 6.3 (slightly increased from 6.0) Monitoring: blood sugar readings at home: does not monitor          Continue current medication regimen: not on medications   Needs refills today. Losartan and methocarbinol for back spasms.   Right shoulder pain is much better. Has seen PT, has exercises to do.   Review of Systems  Eyes:  Negative for blurred vision and double vision.  Respiratory:  Negative for shortness of breath.   Cardiovascular:  Negative for chest pain.  Gastrointestinal:  Negative for abdominal pain, nausea and vomiting.  Musculoskeletal:  Negative for myalgias.  Neurological:  Negative for weakness and headaches.      Objective:     BP (!) 152/80 (BP Location: Right Arm, Patient Position: Sitting, Cuff Size: Large)   Pulse 74    Ht '5\' 11"'$  (1.803 m)   Wt 178 lb 1.6 oz (80.8 kg)   SpO2 100%   BMI 24.84 kg/m    Physical Exam Vitals and nursing note reviewed.  Constitutional:      General: He is not in acute distress.    Appearance: Normal appearance.  Cardiovascular:     Rate and Rhythm: Normal rate and regular rhythm.     Heart sounds: Normal heart sounds.  Pulmonary:     Effort: Pulmonary effort is normal.     Breath sounds: Normal breath sounds.  Musculoskeletal:     Comments: ROM intact to right shoulder. Symptoms have improved. Exercises per PT.   Skin:    General: Skin is warm and dry.     Capillary Refill: Capillary refill takes less than 2 seconds.  Neurological:     General: No focal deficit present.     Mental Status: He is alert. Mental status is at baseline.  Psychiatric:        Mood and Affect: Mood normal.        Behavior: Behavior normal.      No results found for any visits on 08/11/22.    The 10-year ASCVD risk score (Arnett DK, et al., 2019) is: 37.5%    Assessment & Plan:   Problem List Items Addressed This Visit     Elevated blood pressure reading with diagnosis of hypertension - Primary    Currently on prednisone prescribed by urgent care for URI which is likely causing elevation in blood pressure.  Recommend monitoring blood pressure at home 3-4 times per week  to ensure it returns to normal.  Denies chest pain, shortness of breath, lower extremity edema, vision changes and headache.  Last CMP, GFR 67.  Losartan 100 mg daily.  Refill sent.      Relevant Medications   losartan (COZAAR) 100 MG tablet   Hyperlipidemia    Triglyceride level slightly increased to 207, denies any symptoms of muscle weakness or aches or changes in the appearance of urine.  No refills needed today.  He will continue rosuvastatin 5 mg daily.  Tolerating medication well.  Recommend low sugar/low-carb diet with moderate exercise routine.      Relevant Medications   losartan (COZAAR) 100 MG tablet    DDD (degenerative disc disease), lumbosacral    Patient reports back spasms related to his degenerative disc disease, will refill Robaxin today.  Under the care of of a chiropractor for his low back spasms.      Relevant Medications   methocarbamol (ROBAXIN) 500 MG tablet   Prediabetes    Hemoglobin A1c 6.3, slightly increased from previous with 6.0.  He has not on medications currently, recommend low sugar/low-carb diet with moderate exercise routine.     Agrees with plan of care discussed.  Questions answered. Labs reviewed from 08/06/22.     Return in about 3 months (around 11/10/2022) for for HTN, HLD, pre-diabetes.    Chalmers Guest, FNP

## 2022-08-11 NOTE — Assessment & Plan Note (Addendum)
Currently on prednisone prescribed by urgent care for URI which is likely causing elevation in blood pressure.  Recommend monitoring blood pressure at home 3-4 times per week to ensure it returns to normal.  Denies chest pain, shortness of breath, lower extremity edema, vision changes and headache.  Last CMP, GFR 67.  Losartan 100 mg daily.  Refill sent.

## 2022-08-11 NOTE — Assessment & Plan Note (Addendum)
Patient reports back spasms related to his degenerative disc disease, will refill Robaxin today.  Under the care of of a chiropractor for his low back spasms.

## 2022-08-11 NOTE — Assessment & Plan Note (Signed)
Hemoglobin A1c 6.3, slightly increased from previous with 6.0.  He has not on medications currently, recommend low sugar/low-carb diet with moderate exercise routine.

## 2022-08-11 NOTE — Assessment & Plan Note (Addendum)
Triglyceride level slightly increased to 207, denies any symptoms of muscle weakness or aches or changes in the appearance of urine.  No refills needed today.  He will continue rosuvastatin 5 mg daily.  Tolerating medication well.  Recommend low sugar/low-carb diet with moderate exercise routine.

## 2022-08-16 ENCOUNTER — Ambulatory Visit
Admission: RE | Admit: 2022-08-16 | Discharge: 2022-08-16 | Disposition: A | Discharge status: Home / Self Care | Payer: PPO | Source: Ambulatory Visit | Attending: Surgery | Admitting: Surgery

## 2022-08-16 DIAGNOSIS — C220 Liver cell carcinoma: Secondary | ICD-10-CM

## 2022-08-16 MED ORDER — GADOPICLENOL 0.5 MMOL/ML IV SOLN
8.0000 mL | Freq: Once | INTRAVENOUS | Status: AC | PRN
  Administered 2022-08-16: 8 mL via INTRAVENOUS

## 2022-08-18 ENCOUNTER — Other Ambulatory Visit: Payer: Self-pay | Admitting: Surgery

## 2022-08-18 DIAGNOSIS — I712 Thoracic aortic aneurysm, without rupture, unspecified: Secondary | ICD-10-CM

## 2022-08-19 ENCOUNTER — Encounter: Payer: Self-pay | Admitting: Adult Health

## 2022-08-20 ENCOUNTER — Encounter (HOSPITAL_COMMUNITY): Payer: Self-pay

## 2022-08-20 ENCOUNTER — Ambulatory Visit (HOSPITAL_COMMUNITY)
Admission: RE | Admit: 2022-08-20 | Discharge: 2022-08-20 | Disposition: A | Discharge status: Home / Self Care | Payer: PPO | Source: Ambulatory Visit | Attending: Family Medicine | Admitting: Family Medicine

## 2022-08-20 ENCOUNTER — Other Ambulatory Visit: Payer: Self-pay

## 2022-08-20 VITALS — BP 145/77 | HR 66 | Temp 98.2°F | Resp 20

## 2022-08-20 DIAGNOSIS — R200 Anesthesia of skin: Secondary | ICD-10-CM

## 2022-08-20 DIAGNOSIS — R519 Headache, unspecified: Secondary | ICD-10-CM | POA: Insufficient documentation

## 2022-08-20 LAB — SEDIMENTATION RATE: Sed Rate: 5 mm/hr (ref 0–16)

## 2022-08-20 LAB — C-REACTIVE PROTEIN: CRP: 0.6 mg/dL (ref <1.0)

## 2022-08-20 NOTE — ED Triage Notes (Signed)
Points to right side of head as area of soreness.  Patient has concerns for soreness and headache in right temporal area since Friday, 08/15/2022.  Pain is intermittent, present more than not.    Patient has taken ibuprofen.    Patient reports weakness on right side from waist down that patient relates to sciatica.  Has history of this.  But admits this episode started with head pain.  No vision changes, no upper extremity pain or weakness.  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  Patient is concerned for Temporal Arteritis

## 2022-08-20 NOTE — ED Provider Notes (Signed)
[facility]    CSN: [id number] Arrival date & time: 08/20/22  7001      History   Chief Complaint Chief Complaint  Patient presents with   Headache   Appointment    9:00    HPI George Watson is a 77 y.o. male.   Patient is here for several issues.  Last week Friday he started with soreness to the right side of his head.  This has continued, with minor headache.  Has not gotten any worse or better.  Motrin does seem to relieve it.  It does come/go.  No vision changes.  No n/v.   Coincidently, he does have back problems.  The right leg has been having some numbness, started around the same time.  Numbness is not constant.  Mild weakness.  He exercises regularly, stretches regularly.  H/o TIA on the left  3 years ago.  No speech issues.  He has issues with L1/L2 and goes for an adjustment every several months.  He has had a diskectomy.   He has had this numbness in the leg in the past related to the back issues.  No issues with balance.   Past Medical History:  Diagnosis Date   Aortic aneurysm (HCC)    Arthritis    BPH (benign prostatic hypertrophy)    Cataract 2015   Cirrhosis (HCC)    COPD (chronic obstructive pulmonary disease) (HCC)    DDD (degenerative disc disease), lumbosacral    Dyspnea    Elevated PSA    Emphysema of lung (HCC)    Essential hypertension, benign    diet controlled   Fracture of shaft of clavicle    GERD (gastroesophageal reflux disease)    H/O drug abuse ([place])    multisubstance   Hepatitis B    Hepatitis C 10/1996   HSV-2 (herpes simplex virus 2) infection    Hx of biopsy 08/2004   liver   Hx of colonic polyps 2022   Hyperlipidemia    Inguinal hernia    right   Other and unspecified hyperlipidemia    Prostate cancer ([place])    Substance abuse ([place])    Tuberculosis     Patient Active Problem List   Diagnosis Date Noted   Cervicalgia 07/28/2022   Loose stools 07/24/2022   Bruising 02/06/2022   Hepatocellular carcinoma  ([facility]) 10/14/2021   Bloating 09/17/2021   COVID-19 08/13/2021   Lung nodule 10/05/2020   Gastroenteritis 05/08/2020   TIA (transient ischemic attack) 07/07/2019   Dilated aortic root ([place]) 06/13/2019   Pure hypertriglyceridemia 04/08/2019   Fear of flying 02/11/2018   Tear of right rotator cuff 08/31/2017   Wellness examination 01/07/2017   Prediabetes 01/06/2017   Atherosclerosis of aorta ([place]) 07/09/2016   Degeneration of lumbar or lumbosacral intervertebral disc 02/15/2014   Elevated blood pressure reading with diagnosis of hypertension    Hyperlipidemia    HSV-2 (herpes simplex virus 2) infection    COPD (chronic obstructive pulmonary disease) (HCC)    Benign prostatic hyperplasia    DDD (degenerative disc disease), lumbosacral    Prostate cancer ([place])    Cirrhosis ([place]) 05/21/1997   Hepatitis C, chronic ([place]) 10/19/1996    Past Surgical History:  Procedure Laterality Date   BACK SURGERY     CERVICAL SPINE SURGERY     EYE SURGERY  3/20   fracture ribs     3   HERNIA REPAIR     INGUINAL HERNIA REPAIR  07/01/2012  Procedure: HERNIA REPAIR INGUINAL ADULT;  Surgeon: Madilyn Hook, DO;  Location: [facility];  Service: General;  Laterality: Right;  with Mesh   LAPAROSCOPIC LIVER ULTRASOUND N/A 11/21/2021   Procedure: LAPAROSCOPIC LIVER ULTRASOUND;  Surgeon: Dwan Bolt, MD;  Location: [place];  Service: General;  Laterality: N/A;   LAPAROSCOPIC PARTIAL HEPATECTOMY Left 11/21/2021   Procedure: LAPAROSCOPIC PARTIAL HEPATECTOMY;  Surgeon: Dwan Bolt, MD;  Location: [place];  Service: General;  Laterality: Left;   LAPAROSCOPY N/A 11/21/2021   Procedure: STAGING LAPAROSCOPY;  Surgeon: Dwan Bolt, MD;  Location: [place];  Service: General;  Laterality: N/A;   left knee meniscus repair  07/21/1990   right rotator cuff  07/21/1990   right shoulder arthroscopy  07/21/2009   SPINE SURGERY  10/14       Home Medications    Prior to Admission medications   Medication Sig Start Date  End Date Taking? Authorizing Provider  albuterol (PROVENTIL) (2.5 MG/3ML) 0.083% nebulizer solution Take 3 mLs (2.5 mg total) by nebulization every 6 (six) hours as needed for wheezing or shortness of breath. 05/29/21 06/04/22  Parrett, Fonnie Mu, NP  albuterol (VENTOLIN HFA) 108 (90 Base) MCG/ACT inhaler INHALE 2 PUFFS INTO THE LUNGS EVERY 6 HOURS AS NEEDED FOR SHORTNESS OF BREATH 01/09/22   de [place], Blondell Reveal, MD  aspirin EC 81 MG EC tablet Take 1 tablet (81 mg total) by mouth daily. 07/09/19   Black, Lezlie Octave, NP  B Complex Vitamins (B COMPLEX PO) Take 1 tablet by mouth daily.    [provider]  Josiah Lobo (FDGARD) 25-20.75 MG CAPS Take per package instructions 06/04/22   Willia Craze, NP  cholecalciferol (VITAMIN D3) 25 MCG (1000 UT) tablet Take 1,000 Units by mouth daily.    [provider]  diazepam (VALIUM) 2 MG tablet Take 1-2 tabs 30 minutes before flight. 07/23/21   de [place], Blondell Reveal, MD  diphenoxylate-atropine (LOMOTIL) 2.5-0.025 MG tablet TAKE 1 TABLET BY MOUTH FOUR TIMES A DAY AS NEEDED FOR DIARRHEA OR LOOSE STOOLS 07/15/22   Armbruster, Carlota Raspberry, MD  doxycycline (VIBRAMYCIN) 100 MG capsule Take 1 capsule (100 mg total) by mouth 2 (two) times daily. Patient not taking: Reported on 08/20/2022 08/08/22   Rondel Oh, MD  dutasteride (AVODART) 0.5 MG capsule Take 0.5 mg by mouth daily.    [provider]  famotidine (PEPCID) 20 MG tablet Take 20 mg by mouth every evening.  Patient not taking: Reported on 08/20/2022    [provider]  fluorouracil (EFUDEX) 5 % cream 1 application Externally Twice a day for 14 days 03/06/22   [provider]  Fluticasone-Umeclidin-Vilant (TRELEGY ELLIPTA) 100-62.5-25 MCG/ACT AEPB INHALE 1 PUFF BY MOUTH EVERY DAY 07/15/22   Parrett, Fonnie Mu, NP  glucosamine-chondroitin 500-400 MG tablet Take 3 tablets by mouth daily. Patient taking differently: Take 2 tablets by mouth daily. 02/15/18   Marrian Salvage, FNP  hydrochlorothiazide (MICROZIDE) 12.5 MG capsule TAKE 1 CAPSULE BY MOUTH EVERY DAY 11/08/21   de [place], Blondell Reveal, MD  losartan (COZAAR) 100 MG tablet Take 1 tablet (100 mg total) by mouth daily. 08/11/22   Chalmers Guest, FNP  methocarbamol (ROBAXIN) 500 MG tablet Take 1 tablet (500 mg total) by mouth every 8 (eight) hours as needed for muscle spasms. 08/11/22   Chalmers Guest, FNP  Multiple Vitamins-Minerals (CENTRUM SILVER PO) Take 1 tablet by mouth daily.    [provider]  Omega-3 Fatty Acids ([place]  OIL PO) Take 1 capsule by mouth daily.    [provider]  pimecrolimus (ELIDEL) 1 % cream     [provider]  rosuvastatin (CRESTOR) 5 MG tablet Take 1 tablet (5 mg total) by mouth daily. 07/03/22   de [place], Raymond J, MD  tamsulosin (FLOMAX) 0.4 MG CAPS capsule Take 0.4 mg by mouth daily.     [provider]  TURMERIC CURCUMIN PO Take 2 capsules by mouth daily.    [provider]    Family History Family History  Problem Relation Age of Onset   Leukemia Mother    Throat cancer Father    Esophageal cancer Father    Cancer Father        esophageal cancer   Breast cancer Sister    Cancer Sister    Prostate cancer Brother    Melanoma Brother    Colon cancer Neg Hx    Stomach cancer Neg Hx    Pancreatic cancer Neg Hx    Liver disease Neg Hx     Social History Social History   Tobacco Use   Smoking status: Former    Packs/day: 1.50    Years: 43.00    Total pack years: 64.50    Types: Cigarettes    Quit date: 02/22/2003    Years since quitting: 19.5   Smokeless tobacco: Never  Vaping Use   Vaping Use: Never used  Substance Use Topics   Alcohol use: Not Currently    Comment: 1988   Drug use: Not Currently    Types: Cocaine, Heroin    Comment: 26 years ago cocaine, heroin. methadone     Allergies   Patient has no known allergies.   Review of Systems Review of Systems  Constitutional: Negative.   HENT: Negative.     Respiratory: Negative.    Cardiovascular: Negative.   Gastrointestinal: Negative.   Musculoskeletal:  Positive for back pain.  Skin: Negative.   Neurological:  Positive for numbness and headaches. Negative for dizziness, syncope, facial asymmetry, speech difficulty and weakness.  Psychiatric/Behavioral: Negative.       Physical Exam Triage Vital Signs ED Triage Vitals  Enc Vitals Group     BP 08/20/22 0919 (!) 160/94     Pulse Rate 08/20/22 0919 66     Resp 08/20/22 0919 20     Temp 08/20/22 0919 98.2 F (36.8 C)     Temp Source 08/20/22 0919 Oral     SpO2 08/20/22 0919 97 %     Weight --      Height --      Head Circumference --      Peak Flow --      Pain Score 08/20/22 0917 3     Pain Loc --      Pain Edu? --      Excl. in [place]? --    No data found.  Updated Vital Signs BP (!) 145/77 (BP Location: Left Arm) Comment (BP Location): repositioned, arm at heart level  Pulse 66   Temp 98.2 F (36.8 C) (Oral)   Resp 20   SpO2 97%   Visual Acuity Right Eye Distance:   Left Eye Distance:   Bilateral Distance:    Right Eye Near:   Left Eye Near:    Bilateral Near:     Physical Exam Constitutional:      Appearance: He is well-developed.  HENT:     Head: Normocephalic and atraumatic.     Comments: No  TTP to the temporal area bilaterally;  he c/o tenderness/sensativity to the right upper scalp area Cardiovascular:     Rate and Rhythm: Normal rate and regular rhythm.  Pulmonary:     Effort: Pulmonary effort is normal.     Breath sounds: No wheezing.  Musculoskeletal:        General: Normal range of motion.     Cervical back: Normal range of motion and neck supple.  Skin:    General: Skin is warm and dry.  Neurological:     Mental Status: He is alert.     Cranial Nerves: No cranial nerve deficit, dysarthria or facial asymmetry.     Sensory: No sensory deficit.     Motor: No weakness.     Coordination: Coordination normal.     Gait: Gait normal.   Psychiatric:        Mood and Affect: Mood normal.     UC Treatments / Results  Labs (all labs ordered are listed, but only abnormal results are displayed) Labs Reviewed  C-REACTIVE PROTEIN  SEDIMENTATION RATE    EKG   Radiology No results found.  Procedures Procedures (including critical care time)  Medications Ordered in UC Medications - No data to display  Initial Impression / Assessment and Plan / UC Course  I have reviewed the triage vital signs and the nursing notes.  Pertinent labs & imaging results that were available during my care of the patient were reviewed by me and considered in my medical decision making (see chart for details).  Clinical Course as of 08/20/22 1136  Wed Aug 20, 2022  1129 CRP: 0.6 [EP]  1129 C-reactive protein [EP]    Clinical Course User Index [EP] Rondel Oh, MD     Patient was seen today for concern of temporal tenderness, specifically temporal arteritis.  His tenderness was actually superior to the right temple.  His crp and sed rate were not concerning.  Discussed this tenderness is likely due to another cause and recommend tylenol for pain.  His leg numbness is recurrent, and likely related to his back.  Advised he follow up with his pcp for further care and discussion.   Final Clinical Impressions(s) / UC Diagnoses   Final diagnoses:  Nonintractable headache, unspecified chronicity pattern, unspecified headache type  Numbness in right leg     Discharge Instructions      You were seen today for headache and leg numbness.  At this time I am not concerned for temporal arteritis.  Your CRP is normal.  I am still waiting on one test to be resulted and if this is elevated we will call to notify you and start treatment if needed.  In the meantime I recommend you take tylenol for pain.  Your leg numbness is likely related to your chronic back issues.  Please follow up with your primary care provider for further  care/discussion.     ED Prescriptions   None    PDMP not reviewed this encounter.   Rondel Oh, MD 08/20/22 [phone]

## 2022-08-20 NOTE — Discharge Instructions (Addendum)
You were seen today for headache and leg numbness.  At this time I am not concerned for temporal arteritis.  Your CRP is normal.  I am still waiting on one test to be resulted and if this is elevated we will call to notify you and start treatment if needed.  In the meantime I recommend you take tylenol for pain.  Your leg numbness is likely related to your chronic back issues.  Please follow up with your primary care provider for further care/discussion.

## 2022-08-26 ENCOUNTER — Encounter: Payer: Self-pay | Admitting: Gastroenterology

## 2022-08-26 ENCOUNTER — Ambulatory Visit: Payer: PPO | Admitting: Gastroenterology

## 2022-08-26 VITALS — BP 132/70 | HR 72 | Ht 71.0 in | Wt 177.0 lb

## 2022-08-26 DIAGNOSIS — C220 Liver cell carcinoma: Secondary | ICD-10-CM

## 2022-08-26 DIAGNOSIS — M6208 Separation of muscle (nontraumatic), other site: Secondary | ICD-10-CM | POA: Diagnosis not present

## 2022-08-26 DIAGNOSIS — K580 Irritable bowel syndrome with diarrhea: Secondary | ICD-10-CM

## 2022-08-26 DIAGNOSIS — K7460 Unspecified cirrhosis of liver: Secondary | ICD-10-CM | POA: Diagnosis not present

## 2022-08-26 NOTE — Progress Notes (Signed)
HPI :  77 year old male here for follow-up visit.  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   Recall he has a history of hepatitis C since 1990s, treated with multiple rounds of interferon and ribavirin which he failed.  He eventually had treatment at [facility] in 2014 for hep C with new regimen and was eradicated.  Remote alcohol use but nothing recently.  There has been suggestions on remote ultrasounds that he had underlying cirrhosis however ultrasounds in recent years have not shown any evidence of that.  He has not had any decompensations of his liver. Unfortunately found to have [facility] In March 2023. He was referred to oncology and underwent a liver biopsy which confirmed [place].  He was then referred to surgery, Dr. Zenia Resides, had resection of the mass with clear margins on May 4.  He tolerated the surgical resection quite well, was hospitalized for only a day and discharged home.   He has been followed with serial imaging since the resection.  He had an MRI of his liver on January 29, showing postsurgical changes with unchanged multifocal subcentimeter lesions in his liver which are being closely monitored with MRI.  He also had a CT scan this past October which suggested cirrhotic liver.  This also showed some colonic diverticulosis with a short segment of sigmoid thickening likely reflective of diverticular disease.  He had a colonoscopy with [name] in 2022 which showed diverticulosis no high risk lesions.   Recall he had an extensive evaluation for loose stools in the past, thought to have postinfectious IBS.  See prior notes for details of his work-up.  His bowels in general have been okay at baseline.  He has some rare loose stools that bother him but nothing significant.  He was seen by a functional medicine doctor who did stool studies and told him he had Candida in his colon.  He took a course  of fluconazole and he states he tolerated okay.  Perhaps this has helped with some of his bowel function.  His IBS has not been bothering him as much as usual.  He continues to complain of abdominal distention that can come and go.  He specifically he worries about an area of his abdomen that protrudes when he sits up.  He does a lot of core exercises and is very active.  There have been no imaging abnormalities to correlate to his distended abdomen to date. He was offered a SIBO breath test but never took it.   Prior evaluation: Colonoscopy 09/11/20 - 27m rectal polyp, divertculosis, small hemorrhoids, normal ileum - biopsies taken to rule out microscopic colitis  - polyp sessile serrated, normal colon otherwise - repeat colon in 5 years    EGD 12/05/20: - The exam of the esophagus was otherwise normal. No varices. - The entire examined stomach was normal. No varices. - A single small angiodysplastic lesion was found in the second portion of the duodenum. - The duodenal bulb and second portion of the duodenum were otherwise normal.    C diff positive 01/10/21: Treated with vancomycin    Liver biopsy 10/24/21: FINAL MICROSCOPIC DIAGNOSIS:   A. LIVER, LEFT LOBE, BIOPSY:  - Moderately differentiated hepatocellular carcinoma, see comment   Had liver resection with Dr. AZenia Resides5/4, discharged on 5/5 Preoperative Diagnosis: Hepatocellular carcinoma Postoperative Diagnosis: Same   Procedure: Staging laparoscopy, laparoscopic partial left hepatectomy (segment 3), intraoperative  liver ultrasound   FINAL MICROSCOPIC DIAGNOSIS:   A.   LIVER, LEFT LATERAL, PARTIAL HEPATECTOMY:  -    Hepatocellular carcinoma, grade 2 (moderately differentiated), 2.8  cm in greatest dimension, margin negative for invasive carcinoma.  -    Non-cirrhotic liver with mild macrovesicular steatosis.     MRI liver 08/18/22: IMPRESSION: 1. Postsurgical changes from segment 3 wedge resection without recurrent/residual  disease. 2. Unchanged multifocal hepatic subcentimeter arterially enhancing foci without washout or capsule appearance. No definite new focal lesions. LI-RADS 3. Recommend continued attention on follow-up.  Past Medical History:  Diagnosis Date   Aortic aneurysm (HCC)    Arthritis    BPH (benign prostatic hypertrophy)    Cataract 2015   Cirrhosis (HCC)    COPD (chronic obstructive pulmonary disease) (HCC)    DDD (degenerative disc disease), lumbosacral    Dyspnea    Elevated PSA    Emphysema of lung (HCC)    Essential hypertension, benign    diet controlled   Fracture of shaft of clavicle    GERD (gastroesophageal reflux disease)    H/O drug abuse ([place])    multisubstance   Hepatitis B    Hepatitis C 10/1996   HSV-2 (herpes simplex virus 2) infection    Hx of biopsy 08/2004   liver   Hx of colonic polyps 2022   Hyperlipidemia    Inguinal hernia    right   Other and unspecified hyperlipidemia    Prostate cancer ([place])    Substance abuse ([place])    Tuberculosis      Past Surgical History:  Procedure Laterality Date   BACK SURGERY     CERVICAL SPINE SURGERY     EYE SURGERY  3/20   fracture ribs     3   HERNIA REPAIR     INGUINAL HERNIA REPAIR  07/01/2012   Procedure: HERNIA REPAIR INGUINAL ADULT;  Surgeon: Madilyn Hook, DO;  Location: [facility];  Service: General;  Laterality: Right;  with Mesh   LAPAROSCOPIC LIVER ULTRASOUND N/A 11/21/2021   Procedure: LAPAROSCOPIC LIVER ULTRASOUND;  Surgeon: Dwan Bolt, MD;  Location: [facility];  Service: General;  Laterality: N/A;   LAPAROSCOPIC PARTIAL HEPATECTOMY Left 11/21/2021   Procedure: LAPAROSCOPIC PARTIAL HEPATECTOMY;  Surgeon: Dwan Bolt, MD;  Location: [facility];  Service: General;  Laterality: Left;   LAPAROSCOPY N/A 11/21/2021   Procedure: STAGING LAPAROSCOPY;  Surgeon: Dwan Bolt, MD;  Location: [facility];  Service: General;  Laterality: N/A;   left knee meniscus repair  07/21/1990   right rotator cuff  07/21/1990   right  shoulder arthroscopy  07/21/2009   SPINE SURGERY  10/14   Family History  Problem Relation Age of Onset   Leukemia Mother    Throat cancer Father    Esophageal cancer Father    Cancer Father        esophageal cancer   Breast cancer Sister    Cancer Sister    Prostate cancer Brother    Melanoma Brother    Colon cancer Neg Hx    Stomach cancer Neg Hx    Pancreatic cancer Neg Hx    Liver disease Neg Hx    Social History   Tobacco Use   Smoking status: Former    Packs/day: 1.50    Years: 43.00    Total pack years: 64.50    Types: Cigarettes    Quit date: 02/22/2003    Years since quitting: 19.5   Smokeless tobacco: Never  Vaping Use   Vaping Use: Never used  Substance Use Topics   Alcohol use: Not Currently    Comment: 1988   Drug use: Not Currently    Types: Cocaine, Heroin    Comment: 26 years ago cocaine, heroin. methadone   Current Outpatient Medications  Medication Sig Dispense Refill   albuterol (VENTOLIN HFA) 108 (90 Base) MCG/ACT inhaler INHALE 2 PUFFS INTO THE LUNGS EVERY 6 HOURS AS NEEDED FOR SHORTNESS OF BREATH 18 each 1   aspirin EC 81 MG EC tablet Take 1 tablet (81 mg total) by mouth daily.     B Complex Vitamins (B COMPLEX PO) Take 1 tablet by mouth daily.     cholecalciferol (VITAMIN D3) 25 MCG (1000 UT) tablet Take 1,000 Units by mouth daily.     diazepam (VALIUM) 2 MG tablet Take 1-2 tabs 30 minutes before flight. 10 tablet 0   diphenoxylate-atropine (LOMOTIL) 2.5-0.025 MG tablet TAKE 1 TABLET BY MOUTH FOUR TIMES A DAY AS NEEDED FOR DIARRHEA OR LOOSE STOOLS 60 tablet 0   dutasteride (AVODART) 0.5 MG capsule Take 0.5 mg by mouth daily.     fluorouracil (EFUDEX) 5 % cream 1 application Externally Twice a day for 14 days     Fluticasone-Umeclidin-Vilant (TRELEGY ELLIPTA) 100-62.5-25 MCG/ACT AEPB INHALE 1 PUFF BY MOUTH EVERY DAY 60 each 5   glucosamine-chondroitin 500-400 MG tablet Take 3 tablets by mouth daily. (Patient taking differently: Take 2 tablets by  mouth daily.)     hydrochlorothiazide (MICROZIDE) 12.5 MG capsule TAKE 1 CAPSULE BY MOUTH EVERY DAY 90 capsule 3   losartan (COZAAR) 100 MG tablet Take 1 tablet (100 mg total) by mouth daily. 90 tablet 0   methocarbamol (ROBAXIN) 500 MG tablet Take 1 tablet (500 mg total) by mouth every 8 (eight) hours as needed for muscle spasms. 30 tablet 0   Multiple Vitamins-Minerals (CENTRUM SILVER PO) Take 1 tablet by mouth daily.     Omega-3 Fatty Acids (FISH OIL PO) Take 1 capsule by mouth daily.     pimecrolimus (ELIDEL) 1 % cream      rosuvastatin (CRESTOR) 5 MG tablet Take 1 tablet (5 mg total) by mouth daily. 90 tablet 0   tamsulosin (FLOMAX) 0.4 MG CAPS capsule Take 0.4 mg by mouth daily.      TURMERIC CURCUMIN PO Take 2 capsules by mouth daily.     albuterol (PROVENTIL) (2.5 MG/3ML) 0.083% nebulizer solution Take 3 mLs (2.5 mg total) by nebulization every 6 (six) hours as needed for wheezing or shortness of breath. 75 mL 5   No current facility-administered medications for this visit.   No Known Allergies   Review of Systems: All systems reviewed and negative except where noted in HPI.    MR LIVER W WO CONTRAST  Result Date: 08/18/2022 CLINICAL DATA:  History of prostate cancer and hepatocellular carcinoma status post resection 11/21/2021. EXAM: MRI ABDOMEN WITHOUT AND WITH CONTRAST TECHNIQUE: Multiplanar multisequence MR imaging of the abdomen was performed both before and after the administration of intravenous contrast. CONTRAST:  8 mL Vueway COMPARISON:  CT abdomen and pelvis dated 04/22/2022, MR abdomen dated 10/04/2021 FINDINGS: Lower chest: No acute findings. Hepatobiliary: Postsurgical changes from segment 3 wedge resection. No signal abnormality to suggest residual/recurrent disease. Multifocal subcentimeter arterially enhancing foci without washout or capsule appearance are unchanged, for example: -9 mm segment 2 (12:52) -9 mm segment 5/8 (12:61) -8 mm segment 7 (12:64) No definite new  focal lesions. No bile duct dilation. Normal gallbladder. Pancreas:  No mass, inflammatory changes, or other parenchymal abnormality identified. Spleen:  Within normal limits in size and appearance. Adrenals/Urinary Tract: No adrenal nodules. No suspicious renal masses identified. No evidence of hydronephrosis. Stomach/Bowel: Visualized portions within the abdomen are unremarkable. Vascular/Lymphatic: No pathologically enlarged lymph nodes identified. No abdominal aortic aneurysm demonstrated. Aortic atherosclerosis. Other:  None. Musculoskeletal: No suspicious bone lesions identified. IMPRESSION: 1. Postsurgical changes from segment 3 wedge resection without recurrent/residual disease. 2. Unchanged multifocal hepatic subcentimeter arterially enhancing foci without washout or capsule appearance. No definite new focal lesions. LI-RADS 3. Recommend continued attention on follow-up. Electronically Signed   By: Darrin Nipper M.D.   On: 08/18/2022 14:39    Physical Exam: BP 132/70   Pulse 72   Ht '5\' 11"'$  (1.803 m)   Wt 177 lb (80.3 kg)   SpO2 99%   BMI 24.69 kg/m  Constitutional: Pleasant,well-developed, male in no acute distress. Abdominal: Soft, nondistended, nontender. (+) diastasis recti, There are no masses palpable. Extremities: no edema Neurological: Alert and oriented to person place and time. Skin: Skin is warm and dry. No rashes noted. Psychiatric: Normal mood and affect. Behavior is normal.  Lab Results  Component Value Date   WBC 8.7 06/24/2022   HGB 15.9 06/24/2022   HCT 46.7 06/24/2022   MCV 92.3 06/24/2022   PLT 252.0 06/24/2022    Lab Results  Component Value Date   CREATININE 1.08 06/24/2022   BUN 24 (H) 06/24/2022   NA 137 06/24/2022   K 4.3 06/24/2022   CL 102 06/24/2022   CO2 25 06/24/2022    Lab Results  Component Value Date   ALT 27 06/24/2022   AST 22 06/24/2022   ALKPHOS 60 06/24/2022   BILITOT 0.5 06/24/2022    Lab Results  Component Value Date   INR 1.0  06/24/2022   INR 1.0 10/24/2021   INR 1.0 09/23/2021    ASSESSMENT: 77 y.o. male here for assessment of the following  1. Hepatocellular carcinoma ([place])   2. Cirrhosis of liver without ascites, unspecified hepatic cirrhosis type ([place])   3. Irritable bowel syndrome with diarrhea   4. Diastasis recti    HCC likely secondary to longstanding HCV which was eradicated.  He has undergone surgical resection with Dr. Zenia Resides and has some subcentimeter lesions on follow-up MRI which are being surveyed.  Dr. Zenia Resides is monitoring him for this.  He does appear to have cirrhosis on prior imaging of his liver but he is well compensated.  We may consider an EGD in May 2025 for screening for varices, otherwise he will continue imaging with Dr. Zenia Resides via MRI.  His bowels are at baseline.  He will continue Lomotil as needed as that works pretty well for him.  His main concern is his abdominal wall and bloating.  On exam he has a diastases recti in his mid abdomen which is causing changes in his abdominal wall with positioning.  I reassured him on this I do not feel that it warrants surgical repair at this time he is not having any significant pain from it.  He also has some intestinal gas and bloating likely from underlying IBS which can make this worse.  We discussed if you want to do the breath test for SIBO.  He is doing okay right now, if he has significant bloating that bothers him would just consider empiric rifaximin, could try to get him a sample.  I reassured him about his abdominal wall and the diastases recti.  He  wants to discuss this with his surgeon, again I would recommend against surgery unless he is really having a lot of symptoms or pain from this.  PLAN: - surveillance MRI per Dr. Zenia Resides - consider EGD May 2025 - diastasis recti noted - reassured him as above - bloating / loose stools - doing better lately, suspect post infectious IBS. If this recurs consider empiric rifaximin - f/u 6  months  Jolly Mango, MD [facility]

## 2022-08-26 NOTE — Patient Instructions (Addendum)
_______________________________________________________  If your blood pressure at your visit was 140/90 or greater, please contact your primary care physician to follow up on this.  _______________________________________________________  If you are age 77 or older, your body mass index should be between 23-30. Your Body mass index is 24.69 kg/m. If this is out of the aforementioned range listed, please consider follow up with your Primary Care Provider.  If you are age 78 or younger, your body mass index should be between 19-25. Your Body mass index is 24.69 kg/m. If this is out of the aformentioned range listed, please consider follow up with your Primary Care Provider.   ________________________________________________________  The [place] [facility] providers would like to encourage you to use [facility] to communicate with providers for non-urgent requests or questions.  Due to long hold times on the telephone, sending your provider a message by [facility] may be a faster and more efficient way to get a response.  Please allow 48 business hours for a response.  Please remember that this is for non-urgent requests.  _______________________________________________________  Please follow up in 6 months.  Thank you for entrusting me with your care and for choosing [facility] Dr.  Cellar

## 2022-08-27 DIAGNOSIS — M9901 Segmental and somatic dysfunction of cervical region: Secondary | ICD-10-CM | POA: Diagnosis not present

## 2022-08-27 DIAGNOSIS — M9903 Segmental and somatic dysfunction of lumbar region: Secondary | ICD-10-CM | POA: Diagnosis not present

## 2022-08-27 DIAGNOSIS — S39012A Strain of muscle, fascia and tendon of lower back, initial encounter: Secondary | ICD-10-CM | POA: Diagnosis not present

## 2022-08-27 DIAGNOSIS — M47812 Spondylosis without myelopathy or radiculopathy, cervical region: Secondary | ICD-10-CM | POA: Diagnosis not present

## 2022-08-27 DIAGNOSIS — M47817 Spondylosis without myelopathy or radiculopathy, lumbosacral region: Secondary | ICD-10-CM | POA: Diagnosis not present

## 2022-08-27 DIAGNOSIS — M9902 Segmental and somatic dysfunction of thoracic region: Secondary | ICD-10-CM | POA: Diagnosis not present

## 2022-09-02 ENCOUNTER — Ambulatory Visit (HOSPITAL_COMMUNITY)
Admission: RE | Admit: 2022-09-02 | Discharge: 2022-09-02 | Disposition: A | Discharge status: Home / Self Care | Payer: PPO | Source: Ambulatory Visit | Attending: Internal Medicine | Admitting: Internal Medicine

## 2022-09-02 ENCOUNTER — Encounter (HOSPITAL_COMMUNITY): Payer: Self-pay

## 2022-09-02 VITALS — BP 142/75 | HR 79 | Temp 98.1°F | Resp 20

## 2022-09-02 DIAGNOSIS — J441 Chronic obstructive pulmonary disease with (acute) exacerbation: Secondary | ICD-10-CM

## 2022-09-02 MED ORDER — METHYLPREDNISOLONE 4 MG PO TBPK: ORAL_TABLET | ORAL | 0 refills | Status: DC

## 2022-09-02 MED ORDER — IPRATROPIUM-ALBUTEROL 0.5-2.5 (3) MG/3ML IN SOLN
RESPIRATORY_TRACT | Status: AC
  Filled 2022-09-02: qty 3

## 2022-09-02 MED ORDER — BENZONATATE 100 MG PO CAPS: 100.0000 mg | ORAL_CAPSULE | Freq: Three times a day (TID) | ORAL | 0 refills | Status: DC

## 2022-09-02 MED ORDER — METHYLPREDNISOLONE SODIUM SUCC 125 MG IJ SOLR
INTRAMUSCULAR | Status: AC
  Filled 2022-09-02: qty 2

## 2022-09-02 MED ORDER — AZITHROMYCIN 250 MG PO TABS: 250.0000 mg | ORAL_TABLET | Freq: Every day | ORAL | 0 refills | Status: DC

## 2022-09-02 MED ORDER — IPRATROPIUM-ALBUTEROL 0.5-2.5 (3) MG/3ML IN SOLN
3.0000 mL | Freq: Once | RESPIRATORY_TRACT | Status: AC
  Administered 2022-09-02: 3 mL via RESPIRATORY_TRACT

## 2022-09-02 MED ORDER — METHYLPREDNISOLONE SODIUM SUCC 125 MG IJ SOLR
80.0000 mg | Freq: Once | INTRAMUSCULAR | Status: AC
  Administered 2022-09-02: 80 mg via INTRAMUSCULAR

## 2022-09-02 NOTE — ED Provider Notes (Signed)
[facility]    CSN: [id number] Arrival date & time: 09/02/22  [id number]      History   Chief Complaint Chief Complaint  Patient presents with   Wheezing    Experiencing respiratory difficulty and trouble breathing. - Entered by patient   Shortness of Breath    HPI George Watson is a 77 y.o. male.   Patient presents urgent care for evaluation of cough, wheeze, and chest congestion for the last 2 days.  Patient states this feels very similar to his typical COPD exacerbation.  He is reporting current shortness of breath with rest and with exertion.  Denies chest pain and heart palpitations.  He has heard wheezing to his chest over the last couple of nights and has been using his albuterol inhaler as needed with relief of shortness of breath and wheeze.  He has an albuterol nebulizer at home but has not been using this.  He has also been using his Trelegy as prescribed.  He is a former smoker and drug user but denies current tobacco/drug use (quit 20 years ago).  Denies orthopnea, leg swelling, nausea, vomiting, fever/chills, recent exposure to known sick contacts, sore throat, ear pain, headache, and dizziness.  Cough is productive with clear/yellow sputum and worse at nighttime.  He has only been using his albuterol inhaler as needed for symptoms and has not attempted use of any over-the-counter medicines to help with symptoms.  He states he was at urgent care approximately 1 month ago for similar symptoms where he received a steroid and antibiotic.  No other recent steroid or antibiotic use.  He has not seen his pulmonologist in approximately 6 months and states that he is due for a visit.   Wheezing Associated symptoms: shortness of breath   Shortness of Breath Associated symptoms: wheezing     Past Medical History:  Diagnosis Date   Aortic aneurysm (HCC)    Arthritis    BPH (benign prostatic hypertrophy)    Cataract 2015   Cirrhosis (HCC)    COPD (chronic obstructive  pulmonary disease) (HCC)    DDD (degenerative disc disease), lumbosacral    Dyspnea    Elevated PSA    Emphysema of lung (HCC)    Essential hypertension, benign    diet controlled   Fracture of shaft of clavicle    GERD (gastroesophageal reflux disease)    H/O drug abuse ([place])    multisubstance   Hepatitis B    Hepatitis C 10/1996   HSV-2 (herpes simplex virus 2) infection    Hx of biopsy 08/2004   liver   Hx of colonic polyps 2022   Hyperlipidemia    Inguinal hernia    right   Other and unspecified hyperlipidemia    Prostate cancer ([place])    Substance abuse ([place])    Tuberculosis     Patient Active Problem List   Diagnosis Date Noted   Cervicalgia 07/28/2022   Loose stools 07/24/2022   Bruising 02/06/2022   Hepatocellular carcinoma ([place]) 10/14/2021   Bloating 09/17/2021   COVID-19 08/13/2021   Lung nodule 10/05/2020   Gastroenteritis 05/08/2020   TIA (transient ischemic attack) 07/07/2019   Dilated aortic root ([place]) 06/13/2019   Pure hypertriglyceridemia 04/08/2019   Fear of flying 02/11/2018   Tear of right rotator cuff 08/31/2017   Wellness examination 01/07/2017   Prediabetes 01/06/2017   Atherosclerosis of aorta ([place]) 07/09/2016   Degeneration of lumbar or lumbosacral intervertebral disc 02/15/2014   Elevated blood pressure reading  with diagnosis of hypertension    Hyperlipidemia    HSV-2 (herpes simplex virus 2) infection    COPD (chronic obstructive pulmonary disease) (HCC)    Benign prostatic hyperplasia    DDD (degenerative disc disease), lumbosacral    Prostate cancer ([place])    Cirrhosis ([place]) 05/21/1997   Hepatitis C, chronic ([place]) 10/19/1996    Past Surgical History:  Procedure Laterality Date   BACK SURGERY     CERVICAL SPINE SURGERY     EYE SURGERY  3/20   fracture ribs     3   HERNIA REPAIR     INGUINAL HERNIA REPAIR  07/01/2012   Procedure: HERNIA REPAIR INGUINAL ADULT;  Surgeon: Madilyn Hook, DO;  Location: [facility];  Service: General;   Laterality: Right;  with Mesh   LAPAROSCOPIC LIVER ULTRASOUND N/A 11/21/2021   Procedure: LAPAROSCOPIC LIVER ULTRASOUND;  Surgeon: Dwan Bolt, MD;  Location: [facility];  Service: General;  Laterality: N/A;   LAPAROSCOPIC PARTIAL HEPATECTOMY Left 11/21/2021   Procedure: LAPAROSCOPIC PARTIAL HEPATECTOMY;  Surgeon: Dwan Bolt, MD;  Location: [facility];  Service: General;  Laterality: Left;   LAPAROSCOPY N/A 11/21/2021   Procedure: STAGING LAPAROSCOPY;  Surgeon: Dwan Bolt, MD;  Location: [facility];  Service: General;  Laterality: N/A;   left knee meniscus repair  07/21/1990   right rotator cuff  07/21/1990   right shoulder arthroscopy  07/21/2009   SPINE SURGERY  10/14       Home Medications    Prior to Admission medications   Medication Sig Start Date End Date Taking? Authorizing Provider  albuterol (VENTOLIN HFA) 108 (90 Base) MCG/ACT inhaler INHALE 2 PUFFS INTO THE LUNGS EVERY 6 HOURS AS NEEDED FOR SHORTNESS OF BREATH 01/09/22  Yes de [place], Raymond J, MD  aspirin EC 81 MG EC tablet Take 1 tablet (81 mg total) by mouth daily. 07/09/19  Yes Black, Lezlie Octave, NP  azithromycin (ZITHROMAX) 250 MG tablet Take 1 tablet (250 mg total) by mouth daily. Take first 2 tablets together, then 1 every day until finished. 09/02/22  Yes Talbot Grumbling, FNP  B Complex Vitamins (B COMPLEX PO) Take 1 tablet by mouth daily.   Yes [provider]  benzonatate (TESSALON) 100 MG capsule Take 1 capsule (100 mg total) by mouth every 8 (eight) hours. 09/02/22  Yes Talbot Grumbling, FNP  cholecalciferol (VITAMIN D3) 25 MCG (1000 UT) tablet Take 1,000 Units by mouth daily.   Yes [provider]  diazepam (VALIUM) 2 MG tablet Take 1-2 tabs 30 minutes before flight. 07/23/21  Yes de [place], Raymond J, MD  diphenoxylate-atropine (LOMOTIL) 2.5-0.025 MG tablet TAKE 1 TABLET BY MOUTH FOUR TIMES A DAY AS NEEDED FOR DIARRHEA OR LOOSE STOOLS 07/15/22  Yes Armbruster, Carlota Raspberry, MD  dutasteride (AVODART) 0.5 MG  capsule Take 0.5 mg by mouth daily.   Yes [provider]  fluorouracil (EFUDEX) 5 % cream 1 application Externally Twice a day for 14 days 03/06/22  Yes [provider]  Fluticasone-Umeclidin-Vilant (TRELEGY ELLIPTA) 100-62.5-25 MCG/ACT AEPB INHALE 1 PUFF BY MOUTH EVERY DAY 07/15/22  Yes Parrett, Tammy S, NP  glucosamine-chondroitin 500-400 MG tablet Take 3 tablets by mouth daily. Patient taking differently: Take 2 tablets by mouth daily. 02/15/18  Yes Marrian Salvage, FNP  hydrochlorothiazide (MICROZIDE) 12.5 MG capsule TAKE 1 CAPSULE BY MOUTH EVERY DAY 11/08/21  Yes de [place], Raymond J, MD  losartan (COZAAR) 100 MG tablet Take 1 tablet (100 mg total) by mouth  daily. 08/11/22  Yes Chalmers Guest, FNP  methylPREDNISolone (MEDROL DOSEPAK) 4 MG TBPK tablet Take as directed with food. 09/02/22  Yes Talbot Grumbling, FNP  Multiple Vitamins-Minerals (CENTRUM SILVER PO) Take 1 tablet by mouth daily.   Yes [provider]  Omega-3 Fatty Acids (FISH OIL PO) Take 1 capsule by mouth daily.   Yes [provider]  pimecrolimus (ELIDEL) 1 % cream    Yes [provider]  rosuvastatin (CRESTOR) 5 MG tablet Take 1 tablet (5 mg total) by mouth daily. 07/03/22  Yes de [place], Raymond J, MD  tamsulosin (FLOMAX) 0.4 MG CAPS capsule Take 0.4 mg by mouth daily.    Yes [provider]  TURMERIC CURCUMIN PO Take 2 capsules by mouth daily.   Yes [provider]  albuterol (PROVENTIL) (2.5 MG/3ML) 0.083% nebulizer solution Take 3 mLs (2.5 mg total) by nebulization every 6 (six) hours as needed for wheezing or shortness of breath. 05/29/21 06/04/22  Parrett, Fonnie Mu, NP  methocarbamol (ROBAXIN) 500 MG tablet Take 1 tablet (500 mg total) by mouth every 8 (eight) hours as needed for muscle spasms. 08/11/22   Chalmers Guest, FNP    Family History Family History  Problem Relation Age of Onset   Leukemia Mother    Throat cancer Father    Esophageal cancer  Father    Cancer Father        esophageal cancer   Breast cancer Sister    Cancer Sister    Prostate cancer Brother    Melanoma Brother    Colon cancer Neg Hx    Stomach cancer Neg Hx    Pancreatic cancer Neg Hx    Liver disease Neg Hx     Social History Social History   Tobacco Use   Smoking status: Former    Packs/day: 1.50    Years: 43.00    Total pack years: 64.50    Types: Cigarettes    Quit date: 02/22/2003    Years since quitting: 19.5   Smokeless tobacco: Never  Vaping Use   Vaping Use: Never used  Substance Use Topics   Alcohol use: Not Currently    Comment: 1988   Drug use: Not Currently    Types: Cocaine, Heroin    Comment: 26 years ago cocaine, heroin. methadone     Allergies   Patient has no known allergies.   Review of Systems Review of Systems  Respiratory:  Positive for shortness of breath and wheezing.   Per HPI   Physical Exam Triage Vital Signs ED Triage Vitals [09/02/22 0819]  Enc Vitals Group     BP (!) 142/75     Pulse Rate 79     Resp 20     Temp 98.1 F (36.7 C)     Temp Source Oral     SpO2 96 %     Weight      Height      Head Circumference      Peak Flow      Pain Score      Pain Loc      Pain Edu?      Excl. in [place]?    No data found.  Updated Vital Signs BP (!) 142/75 (BP Location: Right Arm)   Pulse 79   Temp 98.1 F (36.7 C) (Oral)   Resp 20   SpO2 96%   Visual Acuity Right Eye Distance:   Left Eye Distance:   Bilateral Distance:  Right Eye Near:   Left Eye Near:    Bilateral Near:     Physical Exam Vitals and nursing note reviewed.  Constitutional:      Appearance: He is not ill-appearing or toxic-appearing.  HENT:     Head: Normocephalic and atraumatic.     Right Ear: Hearing and external ear normal.     Left Ear: Hearing and external ear normal.     Nose: Nose normal.     Mouth/Throat:     Lips: Pink.     Mouth: Mucous membranes are moist. No injury.     Tongue: No lesions. Tongue does not  deviate from midline.     Palate: No mass and lesions.     Pharynx: Oropharynx is clear. Uvula midline. No pharyngeal swelling, oropharyngeal exudate, posterior oropharyngeal erythema or uvula swelling.     Tonsils: No tonsillar exudate or tonsillar abscesses.  Eyes:     General: Lids are normal. Vision grossly intact. Gaze aligned appropriately.     Extraocular Movements: Extraocular movements intact.     Conjunctiva/sclera: Conjunctivae normal.  Cardiovascular:     Rate and Rhythm: Normal rate and regular rhythm.     Heart sounds: Normal heart sounds, S1 normal and S2 normal.  Pulmonary:     Effort: Pulmonary effort is normal. No tachypnea, accessory muscle usage or respiratory distress.     Breath sounds: Normal air entry. No stridor. Decreased breath sounds and wheezing present.     Comments: Decreased breath sounds to the bilateral upper lung fields and faint expiratory wheezes throughout all lung fields.  Musculoskeletal:     Cervical back: Neck supple.  Lymphadenopathy:     Cervical: No cervical adenopathy.  Skin:    General: Skin is warm and dry.     Capillary Refill: Capillary refill takes less than 2 seconds.     Findings: No rash.  Neurological:     General: No focal deficit present.     Mental Status: He is alert and oriented to person, place, and time. Mental status is at baseline.     Cranial Nerves: No dysarthria or facial asymmetry.  Psychiatric:        Mood and Affect: Mood normal.        Speech: Speech normal.        Behavior: Behavior normal.        Thought Content: Thought content normal.        Judgment: Judgment normal.      UC Treatments / Results  Labs (all labs ordered are listed, but only abnormal results are displayed) Labs Reviewed - No data to display  EKG   Radiology No results found.  Procedures Procedures (including critical care time)  Medications Ordered in UC Medications  ipratropium-albuterol (DUONEB) 0.5-2.5 (3) MG/3ML  nebulizer solution 3 mL (3 mLs Nebulization Given 09/02/22 0843)  methylPREDNISolone sodium succinate (SOLU-MEDROL) 125 mg/2 mL injection 80 mg (80 mg Intramuscular Given 09/02/22 0843)    Initial Impression / Assessment and Plan / UC Course  I have reviewed the triage vital signs and the nursing notes.  Pertinent labs & imaging results that were available during my care of the patient were reviewed by me and considered in my medical decision making (see chart for details).   1.  COPD exacerbation Presentation is consistent with acute COPD exacerbation.  Deferred viral testing due to low suspicion for COVID-19/influenza etiology as patient is afebrile and denies associated sore throat/nasal congestion.  He is agreeable with this.  Patient given DuoNeb and Solu-Medrol 80 mg IM in clinic to improve shortness of breath and cough.  He reports significant improvement in shortness of breath after DuoNeb.  Lung sounds improved significantly after DuoNeb in clinic as well.  May start taking methylprednisolone Dosepak taper tomorrow, advised not to take any NSAID while taking steroid Dosepak.  Patient to take Dosepak with food to avoid stomach upset.  May continue using albuterol inhaler and albuterol nebulizer as needed at home.  Tessalon Perles sent to pharmacy to be taken every 8 hours as needed for cough.  Advised to purchase Mucinex and take this to thin mucus.  Azithromycin to be taken as directed for COPD exacerbation as well.  Follow-up with pulmonology recommended in the next 1 to 2 weeks for further evaluation and ongoing management of COPD.  Discussed physical exam and available lab work findings in clinic with patient.  Counseled patient regarding appropriate use of medications and potential side effects for all medications recommended or prescribed today. Discussed red flag signs and symptoms of worsening condition,when to call the PCP office, return to urgent care, and when to seek higher level of  care in the emergency department. Patient verbalizes understanding and agreement with plan. All questions answered. Patient discharged in stable condition.    Final Clinical Impressions(s) / UC Diagnoses   Final diagnoses:  COPD exacerbation [facility])     Discharge Instructions      You were seen in urgent care today for your COPD exacerbation.  I would like for you to follow-up with your pulmonologist in the next 1 to 2 weeks for ongoing evaluation and management of your COPD.   Take medications as prescribed.  Do not take any ibuprofen when taking methylprednisolone steroid. Take azithromycin as directed. Tessalon Perles every 8 hours as needed for cough. Purchase Mucinex and take this every 12 hours at home as needed for congestion. Continue using your albuterol inhaler/nebulizer as needed.  If you develop any new or worsening symptoms or do not improve in the next 2 to 3 days, please return.  If your symptoms are severe, please go to the emergency room.  Follow-up with your primary care provider for further evaluation and management of your symptoms as well as ongoing wellness visits.  I hope you feel better!      ED Prescriptions     Medication Sig Dispense Auth. Provider   benzonatate (TESSALON) 100 MG capsule Take 1 capsule (100 mg total) by mouth every 8 (eight) hours. 21 capsule Joella Prince M, FNP   methylPREDNISolone (MEDROL DOSEPAK) 4 MG TBPK tablet Take as directed with food. 1 each Talbot Grumbling, FNP   azithromycin (ZITHROMAX) 250 MG tablet Take 1 tablet (250 mg total) by mouth daily. Take first 2 tablets together, then 1 every day until finished. 6 tablet Talbot Grumbling, FNP      PDMP not reviewed this encounter.   Joella Prince [place], [place] 09/02/22 [phone]

## 2022-09-02 NOTE — Discharge Instructions (Addendum)
You were seen in urgent care today for your COPD exacerbation.  I would like for you to follow-up with your pulmonologist in the next 1 to 2 weeks for ongoing evaluation and management of your COPD.   Take medications as prescribed.  Do not take any ibuprofen when taking methylprednisolone steroid. Take azithromycin as directed. Tessalon Perles every 8 hours as needed for cough. Purchase Mucinex and take this every 12 hours at home as needed for congestion. Continue using your albuterol inhaler/nebulizer as needed.  If you develop any new or worsening symptoms or do not improve in the next 2 to 3 days, please return.  If your symptoms are severe, please go to the emergency room.  Follow-up with your primary care provider for further evaluation and management of your symptoms as well as ongoing wellness visits.  I hope you feel better!

## 2022-09-02 NOTE — ED Triage Notes (Signed)
Pt states he has been SOB and wheezing sice yesterday afternoon, he does have COPD. He is taking some IBU and his normal pulmonary meds.

## 2022-09-02 NOTE — Progress Notes (Deleted)
[facility] [address] [place]. [place] [address] [phone]       STROKE FOLLOW UP NOTE  [name] Date of Birth:  08-06-1945 Medical Record Number:  [id number]   Reason for Referral: TIA follow up    CHIEF COMPLAINT:  No chief complaint on file.   HPI:   Update 09/03/2022 JM: Patient requested today's visit due to recent onset of "soreness" on the right side of his head with mild headache.  Was seen in the ED 08/20/2022 for these persistent symptoms.  CRP and sed rate WNL. Felt tenderness is likely due to another cause, not temporal arteritis, and recommended Tylenol for pain.  He is advised to follow-up with PCP for further discussion.   Previously seen for history of TIAs, denies any new or reoccurring stroke/TIA symptoms.  Remains on aspirin and Crestor.  Blood pressure well-controlled.  Routinely follows with PCP for aggressive stroke risk factor management.      History provided for reference purposes only Update 12/08/2019 JM: George Watson returns for follow-up regarding TIAx2 in 06/2019.  He has been stable from a stroke standpoint without new or reoccurring stroke/TIA symptoms.  Continues on aspirin 52m daily and Crestor 10 mg daily for secondary stroke prevention. Unable to tolerate Crestor 260mdose due to myalgias.  Denies continued myalgias on lower dose.  Prior LDL 59 in 08/10/2019 while on Crestor 2035mPlans on repeat lab work by PCP next month.  Blood pressure today 136/80.  No concerns at this time.  Initial visit 08/10/2019 JM: George Watson a 73 65ar old male who is being seen today for hospital follow-up.  He has been doing well since discharge without reoccurring or new stroke or TIA symptoms.  He has completed 3 weeks DAPT and continues on aspirin alone without bleeding or bruising.  He does endorse initially experiencing headaches but have subsided after discontinuing 3-week Plavix duration and he believes this could have been an  intolerance to Plavix.  He has not experienced any additional headaches.  Continues on Crestor 20 mg daily without myalgias.  He has not had any recent lab work follow-up since discharge.  Blood pressure today 141/83 which is normal level for patient.  No further concerns at this time.  Stroke admission 07/07/2019: George Watson a [age]o. male with history of COPD, hypertension, polysubstance abuse in the past, hepatitis C, hypercholesterolemia, prostate cancer presented on 07/07/2019 with transient R arm weakness. Recurrent transient R sided weakness and dysmetria in hospital, no tPA as too mild to treat.  Evaluated by Dr. Xu Erlinda Hongd stroke team with stroke work-up completed and symptoms related to TIAx2 possibly related to left M1 50% stenosis as evidenced on CTA.  CTA head/neck negative E LVO but did show severe stenosis right V1, cervical right ICA arthrosclerosis and left M1 ~50% stenosis.  MRIx2 no acute abnormality.  2D echo showed an EF of 55 to 60%.  EEG obtained due to reoccurring symptoms negative for seizure.  Reports similar symptoms on 05/12/2019.  Previously on aspirin 81 mg 3 times weekly and recommended DAPT for 3 weeks and aspirin 81 mg daily alone.  HTN stable.  LDL 125 previously on Crestor 10 mg 3 times weekly and Lovaza and recommended increasing Crestor dosage to 20 mg daily.  History of intolerance of Lipitor.  Prediabetes with A1c 6.5 and recommend close PCP follow-up.  Other stroke risk factors include advanced age and former tobacco use but no prior history of stroke.  He was discharged home in stable condition without therapy needs.  Code Stroke CT head No acute abnormality. ASPECTS 10.    CTA head & neck no ELVO. Severe stenosis RV1. Cervical right ICA atherosclerosis. L M1 ~50% stenosis. MRI  No acute abnormality  Repeat MRI neg for acute abnormality   2D Echo EF 55 to 60% EEG after recurrent symptoms negative for Sz LDL 125 HgbA1c 6.5 UDS neg     ROS:   14 system  review of systems performed and negative with exception of no complaints  PMH:  Past Medical History:  Diagnosis Date   Aortic aneurysm (HCC)    Arthritis    BPH (benign prostatic hypertrophy)    Cataract 2015   Cirrhosis (HCC)    COPD (chronic obstructive pulmonary disease) (HCC)    DDD (degenerative disc disease), lumbosacral    Dyspnea    Elevated PSA    Emphysema of lung (HCC)    Essential hypertension, benign    diet controlled   Fracture of shaft of clavicle    GERD (gastroesophageal reflux disease)    H/O drug abuse ([place])    multisubstance   Hepatitis B    Hepatitis C 10/1996   HSV-2 (herpes simplex virus 2) infection    Hx of biopsy 08/2004   liver   Hx of colonic polyps 2022   Hyperlipidemia    Inguinal hernia    right   Other and unspecified hyperlipidemia    Prostate cancer ([place])    Substance abuse ([place])    Tuberculosis     PSH:  Past Surgical History:  Procedure Laterality Date   BACK SURGERY     CERVICAL SPINE SURGERY     EYE SURGERY  3/20   fracture ribs     3   HERNIA REPAIR     INGUINAL HERNIA REPAIR  07/01/2012   Procedure: HERNIA REPAIR INGUINAL ADULT;  Surgeon: Madilyn Hook, DO;  Location: [facility];  Service: General;  Laterality: Right;  with Mesh   LAPAROSCOPIC LIVER ULTRASOUND N/A 11/21/2021   Procedure: LAPAROSCOPIC LIVER ULTRASOUND;  Surgeon: Dwan Bolt, MD;  Location: [facility];  Service: General;  Laterality: N/A;   LAPAROSCOPIC PARTIAL HEPATECTOMY Left 11/21/2021   Procedure: LAPAROSCOPIC PARTIAL HEPATECTOMY;  Surgeon: Dwan Bolt, MD;  Location: [facility];  Service: General;  Laterality: Left;   LAPAROSCOPY N/A 11/21/2021   Procedure: STAGING LAPAROSCOPY;  Surgeon: Dwan Bolt, MD;  Location: [facility];  Service: General;  Laterality: N/A;   left knee meniscus repair  07/21/1990   right rotator cuff  07/21/1990   right shoulder arthroscopy  07/21/2009   SPINE SURGERY  10/14    Social History:  Social History   Socioeconomic History    Marital status: Married    Spouse name: Not on file   Number of children: 1   Years of education: Not on file   Highest education level: Not on file  Occupational History   Occupation: Geophysicist/field seismologist   Occupation: Retired     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 methadone   Sexual activity: Not Currently    Comment: number of sex partners in the last 12 months  1  Other Topics Concern   Not on file  Social History Narrative      Married to [employer]. Exercise cardio daily for 30 minutes. Education: [employer].   Social Determinants of Health   Financial Resource Strain: Low Risk  (05/02/2022)   Overall Financial Resource Strain (CARDIA)    Difficulty of Paying Living Expenses: Not hard at all  Food Insecurity: No Food Insecurity (05/02/2022)   Hunger Vital Sign    Worried About Running Out of Food in the Last Year: Never true    Ran Out of Food in the Last Year: Never true  Transportation Needs: No Transportation Needs (05/02/2022)   PRAPARE - Hydrologist (Medical): No    Lack of Transportation (Non-Medical): No  Physical Activity: Sufficiently Active (05/02/2022)   Exercise Vital Sign    Days of Exercise per Week: 7 days    Minutes of Exercise per Session: 60 min  Stress: No Stress Concern Present (05/02/2022)   [place]    Feeling of Stress : Not at all  Social Connections: Moderately Integrated (05/02/2022)   Social Connection and Isolation Panel [NHANES]     Frequency of Communication with Friends and Family: More than three times a week    Frequency of Social Gatherings with Friends and Family: Three times a week    Attends Religious Services: 1 to 4 times per year    Active Member of Clubs or Organizations: No    Attends Archivist Meetings: Never    Marital Status: Married  Human resources officer Violence: Not At Risk (05/02/2022)   Humiliation, Afraid, Rape, and Kick questionnaire    Fear of Current or Ex-Partner: No    Emotionally Abused: No    Physically Abused: No    Sexually Abused: No    Family History:  Family History  Problem Relation Age of Onset   Leukemia Mother    Throat cancer Father    Esophageal cancer Father    Cancer Father        esophageal cancer   Breast cancer Sister    Cancer Sister    Prostate cancer Brother    Melanoma Brother    Colon cancer Neg Hx    Stomach cancer Neg Hx    Pancreatic cancer Neg Hx    Liver disease Neg Hx     Medications:   Current Outpatient Medications on File Prior to Visit  Medication Sig Dispense Refill   albuterol (PROVENTIL) (2.5 MG/3ML) 0.083% nebulizer solution Take 3 mLs (2.5 mg total) by nebulization every 6 (six) hours as needed for wheezing or shortness of breath. 75 mL 5   albuterol (VENTOLIN HFA) 108 (90 Base) MCG/ACT inhaler INHALE 2 PUFFS INTO THE LUNGS EVERY 6 HOURS AS NEEDED FOR SHORTNESS OF BREATH 18 each 1   aspirin EC 81 MG EC tablet Take 1 tablet (81 mg total) by mouth daily.     B Complex Vitamins (B COMPLEX PO) Take 1 tablet by mouth daily.     cholecalciferol (VITAMIN D3) 25 MCG (1000 UT) tablet Take 1,000 Units by mouth daily.     diazepam (VALIUM) 2 MG tablet Take 1-2 tabs 30  minutes before flight. 10 tablet 0   diphenoxylate-atropine (LOMOTIL) 2.5-0.025 MG tablet TAKE 1 TABLET BY MOUTH FOUR TIMES A DAY AS NEEDED FOR DIARRHEA OR LOOSE STOOLS 60 tablet 0   dutasteride (AVODART) 0.5 MG capsule Take 0.5 mg by mouth daily.     fluorouracil (EFUDEX) 5 %  cream 1 application Externally Twice a day for 14 days     Fluticasone-Umeclidin-Vilant (TRELEGY ELLIPTA) 100-62.5-25 MCG/ACT AEPB INHALE 1 PUFF BY MOUTH EVERY DAY 60 each 5   glucosamine-chondroitin 500-400 MG tablet Take 3 tablets by mouth daily. (Patient taking differently: Take 2 tablets by mouth daily.)     hydrochlorothiazide (MICROZIDE) 12.5 MG capsule TAKE 1 CAPSULE BY MOUTH EVERY DAY 90 capsule 3   losartan (COZAAR) 100 MG tablet Take 1 tablet (100 mg total) by mouth daily. 90 tablet 0   methocarbamol (ROBAXIN) 500 MG tablet Take 1 tablet (500 mg total) by mouth every 8 (eight) hours as needed for muscle spasms. 30 tablet 0   Multiple Vitamins-Minerals (CENTRUM SILVER PO) Take 1 tablet by mouth daily.     Omega-3 Fatty Acids (FISH OIL PO) Take 1 capsule by mouth daily.     pimecrolimus (ELIDEL) 1 % cream      rosuvastatin (CRESTOR) 5 MG tablet Take 1 tablet (5 mg total) by mouth daily. 90 tablet 0   tamsulosin (FLOMAX) 0.4 MG CAPS capsule Take 0.4 mg by mouth daily.      TURMERIC CURCUMIN PO Take 2 capsules by mouth daily.     No current facility-administered medications on file prior to visit.    Allergies:  No Known Allergies   Physical Exam  There were no vitals filed for this visit.  There is no height or weight on file to calculate BMI. No results found.  General: well developed, well nourished,  pleasant elderly Caucasian male, seated, in no evident distress Head: head normocephalic and atraumatic.   Neck: supple with no carotid or supraclavicular bruits Cardiovascular: regular rate and rhythm, no murmurs Musculoskeletal: no deformity Skin:  no rash/petichiae Vascular:  Normal pulses all extremities   Neurologic Exam Mental Status: Awake and fully alert.   Normal speech and language.  Oriented to place and time. Recent and remote memory intact. Attention span, concentration and fund of knowledge appropriate. Mood and affect appropriate.  Cranial Nerves: Pupils  equal, briskly reactive to light. Extraocular movements full without nystagmus. Visual fields full to confrontation. Hearing intact. Facial sensation intact. Face, tongue, palate moves normally and symmetrically.  Motor: Normal bulk and tone. Normal strength in all tested extremity muscles. Sensory.: intact to touch , pinprick , position and vibratory sensation.  Coordination: Rapid alternating movements normal in all extremities. Finger-to-nose and heel-to-shin performed accurately bilaterally. Gait and Station: Arises from chair without difficulty. Stance is normal. Gait demonstrates normal stride length and balance Reflexes: 1+ and symmetric. Toes downgoing.       ASSESSMENT: George Watson is a 77 y.o. year old male presented with transient right arm weakness with recurrent right-sided weakness and dysmetria in hospital on 07/07/2019 with stroke work-up completed and symptoms likely related to TIAx2 potentially secondary to left M1 50% stenosis. Vascular risk factors include HTN, HLD, prediabetes, prior tobacco use and intracranial stenosis.  Has been doing well from a stroke standpoint without reoccurring or new stroke/TIA symptoms since discharge    PLAN:  TIA: Continue aspirin 81 mg daily  and Crestor 10 mg daily for secondary stroke prevention. Maintain strict control of hypertension with blood  pressure goal below 130/90, diabetes with hemoglobin A1c goal below 6.5% and cholesterol with LDL cholesterol (bad cholesterol) goal below 70 mg/dL.  I also advised the patient to eat a healthy diet with plenty of whole grains, cereals, fruits and vegetables, exercise regularly with at least 30 minutes of continuous activity daily and maintain ideal body weight.    Overall stable from stroke standpoint and follows with PCP routinely therefore recommend follow-up as needed   I spent 22 minutes of face-to-face and non-face-to-face time with patient.  This included previsit chart review, lab  review, study review, order entry, electronic health record documentation, patient education     Frann Rider, [facility]  [facility] [address] [place], [place] [address]  Phone [phone] Fax [phone] Note: This document was prepared with digital dictation and possible smart phrase technology. Any transcriptional errors that result from this process are unintentional.

## 2022-09-03 ENCOUNTER — Ambulatory Visit: Payer: PPO | Admitting: Adult Health

## 2022-09-06 ENCOUNTER — Other Ambulatory Visit (HOSPITAL_BASED_OUTPATIENT_CLINIC_OR_DEPARTMENT_OTHER): Payer: Self-pay | Admitting: Family Medicine

## 2022-09-06 DIAGNOSIS — J418 Mixed simple and mucopurulent chronic bronchitis: Secondary | ICD-10-CM

## 2022-09-08 DIAGNOSIS — L570 Actinic keratosis: Secondary | ICD-10-CM | POA: Diagnosis not present

## 2022-09-08 DIAGNOSIS — L814 Other melanin hyperpigmentation: Secondary | ICD-10-CM | POA: Diagnosis not present

## 2022-09-08 DIAGNOSIS — L249 Irritant contact dermatitis, unspecified cause: Secondary | ICD-10-CM | POA: Diagnosis not present

## 2022-09-08 DIAGNOSIS — L578 Other skin changes due to chronic exposure to nonionizing radiation: Secondary | ICD-10-CM | POA: Diagnosis not present

## 2022-09-08 DIAGNOSIS — D1801 Hemangioma of skin and subcutaneous tissue: Secondary | ICD-10-CM | POA: Diagnosis not present

## 2022-09-08 DIAGNOSIS — L821 Other seborrheic keratosis: Secondary | ICD-10-CM | POA: Diagnosis not present

## 2022-09-08 DIAGNOSIS — D229 Melanocytic nevi, unspecified: Secondary | ICD-10-CM | POA: Diagnosis not present

## 2022-09-17 ENCOUNTER — Ambulatory Visit: Payer: PPO | Admitting: Surgery

## 2022-09-17 ENCOUNTER — Encounter: Payer: Self-pay | Admitting: Surgery

## 2022-09-17 ENCOUNTER — Ambulatory Visit
Admission: RE | Admit: 2022-09-17 | Discharge: 2022-09-17 | Disposition: A | Discharge status: Home / Self Care | Payer: PPO | Source: Ambulatory Visit | Attending: Surgery | Admitting: Surgery

## 2022-09-17 VITALS — BP 157/74 | HR 66 | Resp 20 | Ht 71.0 in | Wt 177.0 lb

## 2022-09-17 DIAGNOSIS — I7121 Aneurysm of the ascending aorta, without rupture: Secondary | ICD-10-CM

## 2022-09-17 DIAGNOSIS — J439 Emphysema, unspecified: Secondary | ICD-10-CM | POA: Diagnosis not present

## 2022-09-17 DIAGNOSIS — I2510 Atherosclerotic heart disease of native coronary artery without angina pectoris: Secondary | ICD-10-CM | POA: Diagnosis not present

## 2022-09-17 DIAGNOSIS — R918 Other nonspecific abnormal finding of lung field: Secondary | ICD-10-CM | POA: Diagnosis not present

## 2022-09-17 DIAGNOSIS — I712 Thoracic aortic aneurysm, without rupture, unspecified: Secondary | ICD-10-CM

## 2022-09-17 DIAGNOSIS — I700 Atherosclerosis of aorta: Secondary | ICD-10-CM | POA: Diagnosis not present

## 2022-09-17 MED ORDER — IOPAMIDOL (ISOVUE-370) INJECTION 76%
75.0000 mL | Freq: Once | INTRAVENOUS | Status: AC | PRN
  Administered 2022-09-17: 75 mL via INTRAVENOUS

## 2022-09-17 NOTE — Progress Notes (Signed)
HPI:  The patient is a 77 year old gentleman who returns for follow-up of a 4.4 cm fusiform ascending aortic aneurysm.  He has been feeling well overall.  Since I last saw him he underwent laparoscopic partial left hepatectomy for a 2.5 cm mass in the liver in May 2023.  He recovered well from that.  He denies any chest or back pain.  He denies shortness of breath.  Current Outpatient Medications  Medication Sig Dispense Refill   albuterol (VENTOLIN HFA) 108 (90 Base) MCG/ACT inhaler INHALE 2 PUFFS INTO THE LUNGS EVERY 6 HOURS AS NEEDED FOR SHORTNESS OF BREATH 18 each 1   aspirin EC 81 MG EC tablet Take 1 tablet (81 mg total) by mouth daily.     azithromycin (ZITHROMAX) 250 MG tablet Take 1 tablet (250 mg total) by mouth daily. Take first 2 tablets together, then 1 every day until finished. 6 tablet 0   B Complex Vitamins (B COMPLEX PO) Take 1 tablet by mouth daily.     benzonatate (TESSALON) 100 MG capsule Take 1 capsule (100 mg total) by mouth every 8 (eight) hours. 21 capsule 0   cholecalciferol (VITAMIN D3) 25 MCG (1000 UT) tablet Take 1,000 Units by mouth daily.     diazepam (VALIUM) 2 MG tablet Take 1-2 tabs 30 minutes before flight. 10 tablet 0   diphenoxylate-atropine (LOMOTIL) 2.5-0.025 MG tablet TAKE 1 TABLET BY MOUTH FOUR TIMES A DAY AS NEEDED FOR DIARRHEA OR LOOSE STOOLS 60 tablet 0   dutasteride (AVODART) 0.5 MG capsule Take 0.5 mg by mouth daily.     fluorouracil (EFUDEX) 5 % cream 1 application Externally Twice a day for 14 days     Fluticasone-Umeclidin-Vilant (TRELEGY ELLIPTA) 100-62.5-25 MCG/ACT AEPB INHALE 1 PUFF BY MOUTH EVERY DAY 60 each 5   glucosamine-chondroitin 500-400 MG tablet Take 3 tablets by mouth daily. (Patient taking differently: Take 2 tablets by mouth daily.)     hydrochlorothiazide (MICROZIDE) 12.5 MG capsule TAKE 1 CAPSULE BY MOUTH EVERY DAY 90 capsule 3   losartan (COZAAR) 100 MG tablet Take 1 tablet (100 mg total) by mouth daily. 90 tablet 0    methocarbamol (ROBAXIN) 500 MG tablet Take 1 tablet (500 mg total) by mouth every 8 (eight) hours as needed for muscle spasms. 30 tablet 0   methylPREDNISolone (MEDROL DOSEPAK) 4 MG TBPK tablet Take as directed with food. 1 each 0   Multiple Vitamins-Minerals (CENTRUM SILVER PO) Take 1 tablet by mouth daily.     Omega-3 Fatty Acids (FISH OIL PO) Take 1 capsule by mouth daily.     pimecrolimus (ELIDEL) 1 % cream      rosuvastatin (CRESTOR) 5 MG tablet Take 1 tablet (5 mg total) by mouth daily. 90 tablet 0   tamsulosin (FLOMAX) 0.4 MG CAPS capsule Take 0.4 mg by mouth daily.      TURMERIC CURCUMIN PO Take 2 capsules by mouth daily.     albuterol (PROVENTIL) (2.5 MG/3ML) 0.083% nebulizer solution Take 3 mLs (2.5 mg total) by nebulization every 6 (six) hours as needed for wheezing or shortness of breath. 75 mL 5   No current facility-administered medications for this visit.     Physical Exam: BP (!) 157/74 (BP Location: Left Arm, Patient Position: Sitting)   Pulse 66   Resp 20   Ht '5\' 11"'$  (1.803 m)   Wt 177 lb (80.3 kg)   SpO2 95% Comment: ra  BMI 24.69 kg/m  He looks well. Cardiac exam shows a  regular rate and rhythm with normal heart sounds.  There is no murmur. Lungs are clear.   Diagnostic Tests:  Narrative & Impression  CLINICAL DATA:  Aortic aneurysm suspected   EXAM: CT ANGIOGRAPHY CHEST WITH CONTRAST   TECHNIQUE: Multidetector CT imaging of the chest was performed using the standard protocol during bolus administration of intravenous contrast. Multiplanar CT image reconstructions and MIPs were obtained to evaluate the vascular anatomy.   RADIATION DOSE REDUCTION: This exam was performed according to the departmental dose-optimization program which includes automated exposure control, adjustment of the mA and/or kV according to patient size and/or use of iterative reconstruction technique.   CONTRAST:  14m ISOVUE-370 IOPAMIDOL (ISOVUE-370) INJECTION 76%    COMPARISON:  MRI abdomen 09/16/2022.  CT angiogram chest 08/12/2021   FINDINGS: Cardiovascular: Satisfactory opacification of the pulmonary arteries to the segmental level. No evidence of pulmonary embolism. Normal heart size. No pericardial effusion. There are atherosclerotic calcifications of the aorta.   Mediastinum/Nodes: No enlarged mediastinal, hilar, or axillary lymph nodes. Thyroid gland, trachea, and esophagus demonstrate no significant findings.   Lungs/Pleura: Mild emphysematous changes are present. There is some scarring or atelectasis in the left lower lobe there is no pleural effusion or pneumothorax. The lungs are otherwise clear.   Upper Abdomen: There are postsurgical changes in the left lobe of the liver.   Musculoskeletal: There are healed left third through eighth rib fractures. Cervical spinal fusion plate is present.   Review of the MIP images confirms the above findings.   IMPRESSION: 1. No evidence for pulmonary embolism. 2. No acute cardiopulmonary process.   Aortic Atherosclerosis (ICD10-I70.0) and Emphysema (ICD10-J43.9).     Electronically Signed   By: ARonney AstersM.D.   On: 09/17/2022 15:45      Impression:  I have personally reviewed his current CTA of the chest and the ascending aortic aneurysm was measured by me at 4.0 cm.  I also reviewed his previous CTA of the chest from 08/12/2021 where radiology measured the aneurysm at 4.3 x 4.2 cm.  I think this was overestimated and by my measurement was about 4.0 cm.  His aneurysm is well below the surgical threshold of 5.5 cm.  I reviewed both CTA studies with him and answered his questions.  I stressed the importance of continued good blood pressure control in preventing further enlargement and acute aortic dissection.  I advised him against doing any heavy lifting that may require a Valsalva maneuver and could suddenly raise his blood pressure to high levels.  Plan:  I will plan to see him back  in 2 years with a CTA of the chest for aortic surveillance.  I spent 20 minutes performing this established patient evaluation and > 50% of this time was spent face to face counseling and coordinating the care of this patient's aortic aneurysm.    BGaye Pollack MD [facility] ([phone]

## 2022-09-22 ENCOUNTER — Encounter: Payer: Self-pay | Admitting: Gastroenterology

## 2022-09-30 DIAGNOSIS — M9903 Segmental and somatic dysfunction of lumbar region: Secondary | ICD-10-CM | POA: Diagnosis not present

## 2022-09-30 DIAGNOSIS — M47817 Spondylosis without myelopathy or radiculopathy, lumbosacral region: Secondary | ICD-10-CM | POA: Diagnosis not present

## 2022-09-30 DIAGNOSIS — M9901 Segmental and somatic dysfunction of cervical region: Secondary | ICD-10-CM | POA: Diagnosis not present

## 2022-09-30 DIAGNOSIS — M47812 Spondylosis without myelopathy or radiculopathy, cervical region: Secondary | ICD-10-CM | POA: Diagnosis not present

## 2022-09-30 DIAGNOSIS — S39012A Strain of muscle, fascia and tendon of lower back, initial encounter: Secondary | ICD-10-CM | POA: Diagnosis not present

## 2022-09-30 DIAGNOSIS — M9902 Segmental and somatic dysfunction of thoracic region: Secondary | ICD-10-CM | POA: Diagnosis not present

## 2022-10-03 ENCOUNTER — Other Ambulatory Visit (HOSPITAL_BASED_OUTPATIENT_CLINIC_OR_DEPARTMENT_OTHER): Payer: Self-pay | Admitting: Family Medicine

## 2022-10-03 DIAGNOSIS — E785 Hyperlipidemia, unspecified: Secondary | ICD-10-CM

## 2022-10-05 ENCOUNTER — Other Ambulatory Visit: Payer: Self-pay | Admitting: Gastroenterology

## 2022-10-09 DIAGNOSIS — M9903 Segmental and somatic dysfunction of lumbar region: Secondary | ICD-10-CM | POA: Diagnosis not present

## 2022-10-09 DIAGNOSIS — M47812 Spondylosis without myelopathy or radiculopathy, cervical region: Secondary | ICD-10-CM | POA: Diagnosis not present

## 2022-10-09 DIAGNOSIS — M9902 Segmental and somatic dysfunction of thoracic region: Secondary | ICD-10-CM | POA: Diagnosis not present

## 2022-10-09 DIAGNOSIS — S39012A Strain of muscle, fascia and tendon of lower back, initial encounter: Secondary | ICD-10-CM | POA: Diagnosis not present

## 2022-10-09 DIAGNOSIS — M47817 Spondylosis without myelopathy or radiculopathy, lumbosacral region: Secondary | ICD-10-CM | POA: Diagnosis not present

## 2022-10-09 DIAGNOSIS — M9901 Segmental and somatic dysfunction of cervical region: Secondary | ICD-10-CM | POA: Diagnosis not present

## 2022-10-14 DIAGNOSIS — M47812 Spondylosis without myelopathy or radiculopathy, cervical region: Secondary | ICD-10-CM | POA: Diagnosis not present

## 2022-10-14 DIAGNOSIS — M9903 Segmental and somatic dysfunction of lumbar region: Secondary | ICD-10-CM | POA: Diagnosis not present

## 2022-10-14 DIAGNOSIS — M9902 Segmental and somatic dysfunction of thoracic region: Secondary | ICD-10-CM | POA: Diagnosis not present

## 2022-10-14 DIAGNOSIS — M47817 Spondylosis without myelopathy or radiculopathy, lumbosacral region: Secondary | ICD-10-CM | POA: Diagnosis not present

## 2022-10-14 DIAGNOSIS — M9901 Segmental and somatic dysfunction of cervical region: Secondary | ICD-10-CM | POA: Diagnosis not present

## 2022-10-14 DIAGNOSIS — S39012A Strain of muscle, fascia and tendon of lower back, initial encounter: Secondary | ICD-10-CM | POA: Diagnosis not present

## 2022-10-22 DIAGNOSIS — M47817 Spondylosis without myelopathy or radiculopathy, lumbosacral region: Secondary | ICD-10-CM | POA: Diagnosis not present

## 2022-10-22 DIAGNOSIS — M9903 Segmental and somatic dysfunction of lumbar region: Secondary | ICD-10-CM | POA: Diagnosis not present

## 2022-10-22 DIAGNOSIS — M47812 Spondylosis without myelopathy or radiculopathy, cervical region: Secondary | ICD-10-CM | POA: Diagnosis not present

## 2022-10-22 DIAGNOSIS — M9901 Segmental and somatic dysfunction of cervical region: Secondary | ICD-10-CM | POA: Diagnosis not present

## 2022-10-22 DIAGNOSIS — M9902 Segmental and somatic dysfunction of thoracic region: Secondary | ICD-10-CM | POA: Diagnosis not present

## 2022-10-22 DIAGNOSIS — S39012A Strain of muscle, fascia and tendon of lower back, initial encounter: Secondary | ICD-10-CM | POA: Diagnosis not present

## 2022-10-24 ENCOUNTER — Encounter (HOSPITAL_BASED_OUTPATIENT_CLINIC_OR_DEPARTMENT_OTHER): Payer: Self-pay | Admitting: Family Medicine

## 2022-10-24 ENCOUNTER — Other Ambulatory Visit (HOSPITAL_BASED_OUTPATIENT_CLINIC_OR_DEPARTMENT_OTHER): Payer: Self-pay | Admitting: Family Medicine

## 2022-10-24 DIAGNOSIS — I10 Essential (primary) hypertension: Secondary | ICD-10-CM

## 2022-10-24 MED ORDER — LOSARTAN POTASSIUM 100 MG PO TABS: 100.0000 mg | ORAL_TABLET | Freq: Every day | ORAL | 0 refills | Status: DC

## 2022-10-25 ENCOUNTER — Other Ambulatory Visit (HOSPITAL_BASED_OUTPATIENT_CLINIC_OR_DEPARTMENT_OTHER): Payer: Self-pay | Admitting: Family Medicine

## 2022-10-25 ENCOUNTER — Other Ambulatory Visit: Payer: Self-pay | Admitting: Family Medicine

## 2022-10-25 DIAGNOSIS — M5137 Other intervertebral disc degeneration, lumbosacral region: Secondary | ICD-10-CM

## 2022-10-29 DIAGNOSIS — M9903 Segmental and somatic dysfunction of lumbar region: Secondary | ICD-10-CM | POA: Diagnosis not present

## 2022-10-29 DIAGNOSIS — M9901 Segmental and somatic dysfunction of cervical region: Secondary | ICD-10-CM | POA: Diagnosis not present

## 2022-10-29 DIAGNOSIS — M47812 Spondylosis without myelopathy or radiculopathy, cervical region: Secondary | ICD-10-CM | POA: Diagnosis not present

## 2022-10-29 DIAGNOSIS — S39012A Strain of muscle, fascia and tendon of lower back, initial encounter: Secondary | ICD-10-CM | POA: Diagnosis not present

## 2022-10-29 DIAGNOSIS — M47817 Spondylosis without myelopathy or radiculopathy, lumbosacral region: Secondary | ICD-10-CM | POA: Diagnosis not present

## 2022-10-29 DIAGNOSIS — M9902 Segmental and somatic dysfunction of thoracic region: Secondary | ICD-10-CM | POA: Diagnosis not present

## 2022-11-06 DIAGNOSIS — M47812 Spondylosis without myelopathy or radiculopathy, cervical region: Secondary | ICD-10-CM | POA: Diagnosis not present

## 2022-11-06 DIAGNOSIS — S39012A Strain of muscle, fascia and tendon of lower back, initial encounter: Secondary | ICD-10-CM | POA: Diagnosis not present

## 2022-11-06 DIAGNOSIS — M9902 Segmental and somatic dysfunction of thoracic region: Secondary | ICD-10-CM | POA: Diagnosis not present

## 2022-11-06 DIAGNOSIS — M9901 Segmental and somatic dysfunction of cervical region: Secondary | ICD-10-CM | POA: Diagnosis not present

## 2022-11-06 DIAGNOSIS — M9903 Segmental and somatic dysfunction of lumbar region: Secondary | ICD-10-CM | POA: Diagnosis not present

## 2022-11-06 DIAGNOSIS — M47817 Spondylosis without myelopathy or radiculopathy, lumbosacral region: Secondary | ICD-10-CM | POA: Diagnosis not present

## 2022-11-18 DIAGNOSIS — L719 Rosacea, unspecified: Secondary | ICD-10-CM | POA: Diagnosis not present

## 2022-11-18 DIAGNOSIS — M9903 Segmental and somatic dysfunction of lumbar region: Secondary | ICD-10-CM | POA: Diagnosis not present

## 2022-11-18 DIAGNOSIS — M9901 Segmental and somatic dysfunction of cervical region: Secondary | ICD-10-CM | POA: Diagnosis not present

## 2022-11-18 DIAGNOSIS — L249 Irritant contact dermatitis, unspecified cause: Secondary | ICD-10-CM | POA: Diagnosis not present

## 2022-11-18 DIAGNOSIS — M47812 Spondylosis without myelopathy or radiculopathy, cervical region: Secondary | ICD-10-CM | POA: Diagnosis not present

## 2022-11-18 DIAGNOSIS — S39012A Strain of muscle, fascia and tendon of lower back, initial encounter: Secondary | ICD-10-CM | POA: Diagnosis not present

## 2022-11-18 DIAGNOSIS — M47817 Spondylosis without myelopathy or radiculopathy, lumbosacral region: Secondary | ICD-10-CM | POA: Diagnosis not present

## 2022-11-18 DIAGNOSIS — L570 Actinic keratosis: Secondary | ICD-10-CM | POA: Diagnosis not present

## 2022-11-18 DIAGNOSIS — M9902 Segmental and somatic dysfunction of thoracic region: Secondary | ICD-10-CM | POA: Diagnosis not present

## 2022-11-23 ENCOUNTER — Encounter: Payer: Self-pay | Admitting: Gastroenterology

## 2022-11-24 ENCOUNTER — Encounter (HOSPITAL_BASED_OUTPATIENT_CLINIC_OR_DEPARTMENT_OTHER): Payer: Self-pay | Admitting: Family Medicine

## 2022-11-24 ENCOUNTER — Ambulatory Visit (INDEPENDENT_AMBULATORY_CARE_PROVIDER_SITE_OTHER): Payer: PPO | Admitting: Family Medicine

## 2022-11-24 ENCOUNTER — Other Ambulatory Visit: Payer: Self-pay

## 2022-11-24 VITALS — BP 129/73 | HR 82 | Temp 97.6°F | Ht 71.0 in | Wt 173.1 lb

## 2022-11-24 DIAGNOSIS — M5137 Other intervertebral disc degeneration, lumbosacral region: Secondary | ICD-10-CM | POA: Diagnosis not present

## 2022-11-24 DIAGNOSIS — R7303 Prediabetes: Secondary | ICD-10-CM | POA: Diagnosis not present

## 2022-11-24 DIAGNOSIS — E785 Hyperlipidemia, unspecified: Secondary | ICD-10-CM | POA: Insufficient documentation

## 2022-11-24 DIAGNOSIS — I10 Essential (primary) hypertension: Secondary | ICD-10-CM | POA: Insufficient documentation

## 2022-11-24 MED ORDER — DIPHENOXYLATE-ATROPINE 2.5-0.025 MG PO TABS: 1.0000 | ORAL_TABLET | Freq: Four times a day (QID) | ORAL | 1 refills | Status: DC | PRN

## 2022-11-24 MED ORDER — METHOCARBAMOL 500 MG PO TABS: 500.0000 mg | ORAL_TABLET | Freq: Three times a day (TID) | ORAL | 0 refills | Status: DC | PRN

## 2022-11-24 NOTE — Progress Notes (Signed)
Established Patient Office Visit  Subjective   Patient ID: George Watson, male    DOB: 24-Dec-1945  Age: 78 y.o. MRN: [id number]  Chief Complaint  Patient presents with   Follow-up    Pt here for f/u on DM, HLD and HTN    George Watson is a 77 yo male patient who is here for follow-up of chronic conditions.    HTN: currently losartan 100mg  & HCTZ 12.5mg  daily Not checking BP at home   HLD: crestor 5mg  daily  Pre-DM2: diet & exercise  Watching what he eats  Exercise per day   DDD- Issues with back pain spasms- would like refill of Robaxin Seeing chiropractor    Review of Systems  Constitutional:  Negative for malaise/fatigue.  HENT:  Negative for ear pain and tinnitus.   Eyes:  Negative for blurred vision and double vision.  Respiratory:  Negative for cough and shortness of breath.   Cardiovascular:  Negative for chest pain.  Gastrointestinal:  Negative for abdominal pain, nausea and vomiting.  Musculoskeletal:  Negative for myalgias.  Neurological:  Negative for dizziness, weakness and headaches.  Psychiatric/Behavioral:  Negative for depression and suicidal ideas. The patient is not nervous/anxious.     Objective:    BP 129/73 (BP Location: Right Arm, Patient Position: Sitting, Cuff Size: Normal)   Pulse 82   Temp 97.6 F (36.4 C) (Oral)   Ht 5\' 11"  (1.803 m)   Wt 173 lb 1.6 oz (78.5 kg)   SpO2 95%   BMI 24.14 kg/m  BP Readings from Last 3 Encounters:  11/24/22 129/73  09/17/22 (!) 157/74  09/02/22 (!) 142/75     Physical Exam Constitutional:      Appearance: Normal appearance.  Cardiovascular:     Rate and Rhythm: Normal rate and regular rhythm.     Pulses: Normal pulses.     Heart sounds: Normal heart sounds.  Pulmonary:     Effort: Pulmonary effort is normal.     Breath sounds: Normal breath sounds.  Neurological:     Mental Status: He is alert.  Psychiatric:        Mood and Affect: Mood normal.        Behavior: Behavior normal.         Thought Content: Thought content normal.        Judgment: Judgment normal.       Assessment & Plan:  1. Essential hypertension Patient is well appearing and in no acute distress during visit. Currently taking losartan 100mg  daily & HCTZ 12.5mg  daily. Denies adverse side effects. Does not check blood pressure readings at home. Cardiovascular exam with heart regular rate and rhythm. Normal heart sounds, no murmurs present. No lower extremity edema present. Lungs clear to auscultation bilaterally.    2. Hyperlipidemia with target LDL less than 130 Patient currently taking rosuvastatin 5mg  daily. Denies adverse side effects. Plan to recheck lipid panel today.  - Lipid panel; Future  3. Prediabetes Patient reports daily exercise for at least 30 minutes and making healthy diet modifications. Last A1c checked 08/06/2022. Plan to recheck today.  - Hemoglobin A1c; Future  4. DDD (degenerative disc disease), lumbosacral Patient was diagnosed with degenerative disc disease in 2015 by MRI. He reports seeing a chiropractor but still has occasional back spasms. Reports use of Robaxin during muscle spasms with adequate relief. Patient declined ortho referral at this time.  - methocarbamol (ROBAXIN) 500 MG tablet; Take 1 tablet (500 mg total) by mouth every 8 (  eight) hours as needed for muscle spasms.  Dispense: 30 tablet; Refill: 0    Alyson Reedy, FNP

## 2022-11-26 ENCOUNTER — Other Ambulatory Visit (HOSPITAL_BASED_OUTPATIENT_CLINIC_OR_DEPARTMENT_OTHER): Payer: Self-pay | Admitting: Family Medicine

## 2022-11-26 DIAGNOSIS — E785 Hyperlipidemia, unspecified: Secondary | ICD-10-CM | POA: Diagnosis not present

## 2022-11-26 DIAGNOSIS — R7303 Prediabetes: Secondary | ICD-10-CM | POA: Diagnosis not present

## 2022-11-26 LAB — LIPID PANEL
Chol/HDL Ratio: 4.4 ratio (ref 0.0–5.0)
Cholesterol, Total: 149 mg/dL (ref 100–199)
HDL: 34 mg/dL — ABNORMAL LOW (ref >39)
LDL Chol Calc (NIH): 91 mg/dL (ref 0–99)
Triglycerides: 131 mg/dL (ref 0–149)
VLDL Cholesterol Cal: 24 mg/dL (ref 5–40)

## 2022-11-26 LAB — HEMOGLOBIN A1C
Est. average glucose Bld gHb Est-mCnc: 137 mg/dL
Hgb A1c MFr Bld: 6.4 % — ABNORMAL HIGH (ref 4.8–5.6)

## 2022-11-30 ENCOUNTER — Encounter (HOSPITAL_COMMUNITY): Payer: Self-pay

## 2022-11-30 ENCOUNTER — Ambulatory Visit (HOSPITAL_COMMUNITY)
Admission: RE | Admit: 2022-11-30 | Discharge: 2022-11-30 | Disposition: A | Discharge status: Home / Self Care | Payer: PPO | Source: Ambulatory Visit | Attending: Emergency Medicine | Admitting: Emergency Medicine

## 2022-11-30 VITALS — BP 143/80 | HR 68 | Temp 97.8°F | Resp 20

## 2022-11-30 DIAGNOSIS — J441 Chronic obstructive pulmonary disease with (acute) exacerbation: Secondary | ICD-10-CM | POA: Diagnosis not present

## 2022-11-30 MED ORDER — PREDNISONE 10 MG (21) PO TBPK: ORAL_TABLET | Freq: Every day | ORAL | 0 refills | Status: DC

## 2022-11-30 MED ORDER — AZITHROMYCIN 250 MG PO TABS: 250.0000 mg | ORAL_TABLET | Freq: Every day | ORAL | 0 refills | Status: DC

## 2022-11-30 NOTE — ED Provider Notes (Signed)
[facility]    CSN: [id number] Arrival date & time: 11/30/22  1007      History   Chief Complaint Chief Complaint  Patient presents with   Appointment    Appears to be an emphysema flare up. - Entered by patient    HPI George Watson is a 77 y.o. male.   Patient presents for evaluation of a productive cough with green sputum, wheezing and shortness of breath at rest present for 4 days.  Endorses that he checked his O2 saturations this morning on room air and it was 84%, took albuterol treatment and O2 saturation increased to 98% on room air.  No known sick contacts.  Tolerating food and liquids.  Denies fever chills body aches or URI symptoms.  History of COPD, emphysema, using daily Trelegy inhaler.    Past Medical History:  Diagnosis Date   Aortic aneurysm (HCC)    Arthritis    BPH (benign prostatic hypertrophy)    Cataract 2015   Cirrhosis (HCC)    COPD (chronic obstructive pulmonary disease) (HCC)    DDD (degenerative disc disease), lumbosacral    Dyspnea    Elevated PSA    Emphysema of lung (HCC)    Essential hypertension, benign    diet controlled   Fracture of shaft of clavicle    GERD (gastroesophageal reflux disease)    H/O drug abuse (HCC)    multisubstance   Hepatitis B    Hepatitis C 10/1996   HSV-2 (herpes simplex virus 2) infection    Hx of biopsy 08/2004   liver   Hx of colonic polyps 2022   Hyperlipidemia    Inguinal hernia    right   Other and unspecified hyperlipidemia    Prostate cancer (HCC)    Substance abuse (HCC)    Tuberculosis     Patient Active Problem List   Diagnosis Date Noted   Essential hypertension 11/24/2022   Hyperlipidemia with target LDL less than 130 11/24/2022   Cervicalgia 07/28/2022   Loose stools 07/24/2022   Bruising 02/06/2022   Hepatocellular carcinoma (HCC) 10/14/2021   Bloating 09/17/2021   COVID-19 08/13/2021   Lung nodule 10/05/2020   Gastroenteritis 05/08/2020   TIA (transient ischemic  attack) 07/07/2019   Dilated aortic root (HCC) 06/13/2019   Pure hypertriglyceridemia 04/08/2019   Fear of flying 02/11/2018   Tear of right rotator cuff 08/31/2017   Wellness examination 01/07/2017   Prediabetes 01/06/2017   Atherosclerosis of aorta (HCC) 07/09/2016   Degeneration of lumbar or lumbosacral intervertebral disc 02/15/2014   Hyperlipidemia    HSV-2 (herpes simplex virus 2) infection    COPD (chronic obstructive pulmonary disease) (HCC)    Benign prostatic hyperplasia    DDD (degenerative disc disease), lumbosacral    Prostate cancer (HCC)    Cirrhosis (HCC) 05/21/1997   Hepatitis C, chronic (HCC) 10/19/1996    Past Surgical History:  Procedure Laterality Date   BACK SURGERY     CERVICAL SPINE SURGERY     EYE SURGERY  3/20   fracture ribs     3   HERNIA REPAIR     INGUINAL HERNIA REPAIR  07/01/2012   Procedure: HERNIA REPAIR INGUINAL ADULT;  Surgeon: Lodema Pilot, DO;  Location: [facility];  Service: General;  Laterality: Right;  with Mesh   LAPAROSCOPIC LIVER ULTRASOUND N/A 11/21/2021   Procedure: LAPAROSCOPIC LIVER ULTRASOUND;  Surgeon: Fritzi Mandes, MD;  Location: [facility];  Service: General;  Laterality: N/A;   LAPAROSCOPIC PARTIAL  HEPATECTOMY Left 11/21/2021   Procedure: LAPAROSCOPIC PARTIAL HEPATECTOMY;  Surgeon: Fritzi Mandes, MD;  Location: [facility] OR;  Service: General;  Laterality: Left;   LAPAROSCOPY N/A 11/21/2021   Procedure: STAGING LAPAROSCOPY;  Surgeon: Fritzi Mandes, MD;  Location: [facility] OR;  Service: General;  Laterality: N/A;   left knee meniscus repair  07/21/1990   right rotator cuff  07/21/1990   right shoulder arthroscopy  07/21/2009   SPINE SURGERY  10/14       Home Medications    Prior to Admission medications   Medication Sig Start Date End Date Taking? Authorizing Provider  albuterol (VENTOLIN HFA) 108 (90 Base) MCG/ACT inhaler INHALE 2 PUFFS INTO THE LUNGS EVERY 6 HOURS AS NEEDED FOR SHORTNESS OF BREATH 09/08/22  Yes Novella Olive, FNP   aspirin EC 81 MG EC tablet Take 1 tablet (81 mg total) by mouth daily. 07/09/19  Yes Black, Lesle Chris, NP  diphenoxylate-atropine (LOMOTIL) 2.5-0.025 MG tablet Take 1 tablet by mouth 4 (four) times daily as needed for diarrhea or loose stools. 11/24/22  Yes Armbruster, Willaim Rayas, MD  Fluticasone-Umeclidin-Vilant (TRELEGY ELLIPTA) 100-62.5-25 MCG/ACT AEPB INHALE 1 PUFF BY MOUTH EVERY DAY 07/15/22  Yes Parrett, Tammy S, NP  glucosamine-chondroitin 500-400 MG tablet Take 3 tablets by mouth daily. Patient taking differently: Take 2 tablets by mouth daily. 02/15/18  Yes Olive Bass, FNP  hydrochlorothiazide (MICROZIDE) 12.5 MG capsule TAKE 1 CAPSULE BY MOUTH EVERY DAY 10/27/22  Yes de Peru, Raymond J, MD  losartan (COZAAR) 100 MG tablet Take 1 tablet (100 mg total) by mouth daily. 10/24/22  Yes de [place], Raymond J, MD  methocarbamol (ROBAXIN) 500 MG tablet Take 1 tablet (500 mg total) by mouth every 8 (eight) hours as needed for muscle spasms. 11/24/22  Yes Alyson Reedy, FNP  rosuvastatin (CRESTOR) 5 MG tablet TAKE 1 TABLET (5 MG TOTAL) BY MOUTH DAILY. 10/03/22  Yes de Peru, Raymond J, MD    Family History Family History  Problem Relation Age of Onset   Leukemia Mother    Throat cancer Father    Esophageal cancer Father    Cancer Father        esophageal cancer   Breast cancer Sister    Cancer Sister    Prostate cancer Brother    Melanoma Brother    Colon cancer Neg Hx    Stomach cancer Neg Hx    Pancreatic cancer Neg Hx    Liver disease Neg Hx     Social History Social History   Tobacco Use   Smoking status: Former    Packs/day: 1.50    Years: 43.00    Additional pack years: 0.00    Total pack years: 64.50    Types: Cigarettes    Quit date: 02/22/2003    Years since quitting: 19.7   Smokeless tobacco: Never  Vaping Use   Vaping Use: Never used  Substance Use Topics   Alcohol use: Not Currently    Comment: 1988   Drug use: Not Currently    Types: Cocaine, Heroin     Comment: 26 years ago cocaine, heroin. methadone     Allergies   Patient has no known allergies.   Review of Systems Review of Systems  Constitutional: Negative.   HENT: Negative.    Respiratory:  Positive for cough, shortness of breath and wheezing. Negative for apnea, choking, chest tightness and stridor.   Cardiovascular: Negative.   Gastrointestinal: Negative.   Skin: Negative.   Neurological: Negative.  Physical Exam Triage Vital Signs ED Triage Vitals  Enc Vitals Group     BP 11/30/22 1033 (!) 143/80     Pulse Rate 11/30/22 1033 68     Resp 11/30/22 1033 20     Temp 11/30/22 1033 97.8 F (36.6 C)     Temp Source 11/30/22 1033 Oral     SpO2 11/30/22 1033 95 %     Weight --      Height --      Head Circumference --      Peak Flow --      Pain Score 11/30/22 1032 0     Pain Loc --      Pain Edu? --      Excl. in GC? --    No data found.  Updated Vital Signs BP (!) 143/80 (BP Location: Right Arm)   Pulse 68   Temp 97.8 F (36.6 C) (Oral)   Resp 20   SpO2 95%   Visual Acuity Right Eye Distance:   Left Eye Distance:   Bilateral Distance:    Right Eye Near:   Left Eye Near:    Bilateral Near:     Physical Exam Constitutional:      Appearance: Normal appearance.  Eyes:     Extraocular Movements: Extraocular movements intact.  Cardiovascular:     Rate and Rhythm: Normal rate and regular rhythm.     Pulses: Normal pulses.     Heart sounds: Normal heart sounds.  Pulmonary:     Effort: Pulmonary effort is normal.     Breath sounds: Wheezing present.  Neurological:     Mental Status: He is alert and oriented to person, place, and time. Mental status is at baseline.      UC Treatments / Results  Labs (all labs ordered are listed, but only abnormal results are displayed) Labs Reviewed - No data to display  EKG   Radiology No results found.  Procedures Procedures (including critical care time)  Medications Ordered in UC Medications  - No data to display  Initial Impression / Assessment and Plan / UC Course  I have reviewed the triage vital signs and the nursing notes.  Pertinent labs & imaging results that were available during my care of the patient were reviewed by me and considered in my medical decision making (see chart for details).  COPD with acute exacerbation  Vital signs are stable, O2 saturation 95% on room air, pulmonary effort is normal and patient is in no signs of distress nor toxic appearing, wheezing heard to auscultation, consistent with COPD flare, prescribed azithromycin and prednisone taper as he has had success with this medication in the past, recommended continued use of prescribed tailors, advised follow-up if symptoms persist or worsen Final Clinical Impressions(s) / UC Diagnoses   Final diagnoses:  None   Discharge Instructions   None    ED Prescriptions   None    PDMP not reviewed this encounter.   Valinda Hoar, NP 11/30/22 1101

## 2022-11-30 NOTE — ED Triage Notes (Signed)
Patient states O2 was 87 this morning, used the albuterol inhaler and went up to 98%. Patient having wheezing onset Friday, Patient has history of emphysema. No known sick exposure.

## 2022-11-30 NOTE — Discharge Instructions (Signed)
You are being treated for eczema flare  Begin azithromycin as directed to cover for bacteria which is most likely manage your symptoms  Begin prednisone taper as directed to help reduce inflammation and relax the airway to take with food  Continue use of inhalers as directed  If your symptoms continue to persist or worsen at any point please follow-up for reevaluation

## 2022-12-31 DIAGNOSIS — M25811 Other specified joint disorders, right shoulder: Secondary | ICD-10-CM | POA: Diagnosis not present

## 2023-01-01 ENCOUNTER — Telehealth: Payer: Self-pay

## 2023-01-01 NOTE — Telephone Encounter (Signed)
ATC X1 LVM for patient to call the office back. Received surgical clearance request from [facility]. Patient will need office visit with Tammy or Dr. Delton Coombes

## 2023-01-02 ENCOUNTER — Telehealth (HOSPITAL_BASED_OUTPATIENT_CLINIC_OR_DEPARTMENT_OTHER): Payer: Self-pay | Admitting: Family Medicine

## 2023-01-02 NOTE — Telephone Encounter (Signed)
Made appt for PT. NFN.

## 2023-01-02 NOTE — Telephone Encounter (Signed)
Fax from [facility]  Surgical Clearance, to be filled out by provider.  requested to send it via Fax within ASAP. Document is located in providers tray at front office.     Dr Rennis Chris  [facility] Right Reverse Shoulder Arthroplasty

## 2023-01-14 ENCOUNTER — Other Ambulatory Visit: Payer: Self-pay | Admitting: Gastroenterology

## 2023-01-14 NOTE — Telephone Encounter (Signed)
Has appointment on 02/04/23.  Nothing further needed.

## 2023-01-21 DIAGNOSIS — Z8546 Personal history of malignant neoplasm of prostate: Secondary | ICD-10-CM | POA: Diagnosis not present

## 2023-01-23 ENCOUNTER — Encounter: Payer: Self-pay | Admitting: Gastroenterology

## 2023-01-25 ENCOUNTER — Other Ambulatory Visit (HOSPITAL_BASED_OUTPATIENT_CLINIC_OR_DEPARTMENT_OTHER): Payer: Self-pay | Admitting: Family Medicine

## 2023-01-25 DIAGNOSIS — I10 Essential (primary) hypertension: Secondary | ICD-10-CM

## 2023-01-29 ENCOUNTER — Ambulatory Visit (HOSPITAL_COMMUNITY)
Admission: RE | Admit: 2023-01-29 | Discharge: 2023-01-29 | Disposition: A | Discharge status: Home / Self Care | Payer: PPO | Source: Ambulatory Visit | Attending: Internal Medicine | Admitting: Internal Medicine

## 2023-01-29 ENCOUNTER — Encounter (HOSPITAL_COMMUNITY): Payer: Self-pay

## 2023-01-29 VITALS — BP 152/77 | HR 57 | Temp 97.8°F | Resp 20

## 2023-01-29 DIAGNOSIS — J441 Chronic obstructive pulmonary disease with (acute) exacerbation: Secondary | ICD-10-CM

## 2023-01-29 DIAGNOSIS — Z8546 Personal history of malignant neoplasm of prostate: Secondary | ICD-10-CM | POA: Diagnosis not present

## 2023-01-29 DIAGNOSIS — R3915 Urgency of urination: Secondary | ICD-10-CM | POA: Diagnosis not present

## 2023-01-29 DIAGNOSIS — N401 Enlarged prostate with lower urinary tract symptoms: Secondary | ICD-10-CM | POA: Diagnosis not present

## 2023-01-29 MED ORDER — PREDNISONE 10 MG (21) PO TBPK: ORAL_TABLET | Freq: Every day | ORAL | 0 refills | Status: DC

## 2023-01-29 MED ORDER — AZITHROMYCIN 250 MG PO TABS: ORAL_TABLET | ORAL | 0 refills | Status: DC

## 2023-01-29 NOTE — ED Triage Notes (Signed)
Pt states he has been wheezing since Tuesday he is having an emphysema flare up.

## 2023-01-29 NOTE — Discharge Instructions (Addendum)
Please maintain adequate hydration Please take medications as prescribed Continue your inhalers Your lung exam is reassuring No indication for chest x-ray at this time Please return to urgent care if you have any worsening symptoms.

## 2023-01-31 NOTE — ED Provider Notes (Signed)
[facility]    CSN: [id number] Arrival date & time: 01/29/23  4098      History   Chief Complaint Chief Complaint  Patient presents with   Wheezing    Emphysema flare up - Entered by patient    HPI George Watson is a 77 y.o. male with a history of emphysema comes to urgent care with 2-day history of shortness of breath, cough productive of greenish sputum, wheezing and chest tightness.  Symptoms started fairly abruptly and has been persistent.  Patient has been using trilogy with no improvement in symptoms.  He denies any runny nose sneeze, pain on swallowing or sore throat.  Shortness of breath is both at rest and with activity and is usually associated with wheezing.  No fever or chills.  No chest pain or chest pressure.  No sick contacts.  Patient is fully vaccinated against COVID-19 virus.  HPI  Past Medical History:  Diagnosis Date   Aortic aneurysm (HCC)    Arthritis    BPH (benign prostatic hypertrophy)    Cataract 2015   Cirrhosis (HCC)    COPD (chronic obstructive pulmonary disease) (HCC)    DDD (degenerative disc disease), lumbosacral    Dyspnea    Elevated PSA    Emphysema of lung (HCC)    Essential hypertension, benign    diet controlled   Fracture of shaft of clavicle    GERD (gastroesophageal reflux disease)    H/O drug abuse (HCC)    multisubstance   Hepatitis B    Hepatitis C 10/1996   HSV-2 (herpes simplex virus 2) infection    Hx of biopsy 08/2004   liver   Hx of colonic polyps 2022   Hyperlipidemia    Inguinal hernia    right   Other and unspecified hyperlipidemia    Prostate cancer (HCC)    Substance abuse (HCC)    Tuberculosis     Patient Active Problem List   Diagnosis Date Noted   Essential hypertension 11/24/2022   Hyperlipidemia with target LDL less than 130 11/24/2022   Cervicalgia 07/28/2022   Loose stools 07/24/2022   Bruising 02/06/2022   Hepatocellular carcinoma (HCC) 10/14/2021   Bloating 09/17/2021   COVID-19  08/13/2021   Lung nodule 10/05/2020   Gastroenteritis 05/08/2020   TIA (transient ischemic attack) 07/07/2019   Dilated aortic root (HCC) 06/13/2019   Pure hypertriglyceridemia 04/08/2019   Fear of flying 02/11/2018   Tear of right rotator cuff 08/31/2017   Wellness examination 01/07/2017   Prediabetes 01/06/2017   Atherosclerosis of aorta (HCC) 07/09/2016   Degeneration of lumbar or lumbosacral intervertebral disc 02/15/2014   Hyperlipidemia    HSV-2 (herpes simplex virus 2) infection    COPD (chronic obstructive pulmonary disease) (HCC)    Benign prostatic hyperplasia    DDD (degenerative disc disease), lumbosacral    Prostate cancer (HCC)    Cirrhosis (HCC) 05/21/1997   Hepatitis C, chronic (HCC) 10/19/1996    Past Surgical History:  Procedure Laterality Date   BACK SURGERY     CERVICAL SPINE SURGERY     EYE SURGERY  3/20   fracture ribs     3   HERNIA REPAIR     INGUINAL HERNIA REPAIR  07/01/2012   Procedure: HERNIA REPAIR INGUINAL ADULT;  Surgeon: Lodema Pilot, DO;  Location: [facility];  Service: General;  Laterality: Right;  with Mesh   LAPAROSCOPIC LIVER ULTRASOUND N/A 11/21/2021   Procedure: LAPAROSCOPIC LIVER ULTRASOUND;  Surgeon: Fritzi Mandes, MD;  Location: [facility];  Service: General;  Laterality: N/A;   LAPAROSCOPIC PARTIAL HEPATECTOMY Left 11/21/2021   Procedure: LAPAROSCOPIC PARTIAL HEPATECTOMY;  Surgeon: Fritzi Mandes, MD;  Location: [facility] OR;  Service: General;  Laterality: Left;   LAPAROSCOPY N/A 11/21/2021   Procedure: STAGING LAPAROSCOPY;  Surgeon: Fritzi Mandes, MD;  Location: [facility] OR;  Service: General;  Laterality: N/A;   left knee meniscus repair  07/21/1990   right rotator cuff  07/21/1990   right shoulder arthroscopy  07/21/2009   SPINE SURGERY  10/14       Home Medications    Prior to Admission medications   Medication Sig Start Date End Date Taking? Authorizing Provider  albuterol (VENTOLIN HFA) 108 (90 Base) MCG/ACT inhaler INHALE 2 PUFFS INTO THE  LUNGS EVERY 6 HOURS AS NEEDED FOR SHORTNESS OF BREATH 09/08/22  Yes Novella Olive, FNP  aspirin EC 81 MG EC tablet Take 1 tablet (81 mg total) by mouth daily. 07/09/19  Yes Black, Lesle Chris, NP  azithromycin (ZITHROMAX Z-PAK) 250 MG tablet Please take 2 tablets on day 1 and then take 1 tablet orally daily on days 2-5. 01/29/23  Yes Nasteho Glantz, Britta Mccreedy, MD  clobetasol cream (TEMOVATE) 0.05 % 1 APPLICATION EXTERNALLY TWICE A DAY 30 DAYS   Yes [provider]  diazepam (VALIUM) 5 MG tablet Take 5 mg by mouth daily as needed. 12/22/22  Yes [provider]  diphenoxylate-atropine (LOMOTIL) 2.5-0.025 MG tablet TAKE 1 TABLET BY MOUTH FOUR TIMES A DAY AS NEEDED FOR DIARRHEA OR LOOSE STOOLS 01/14/23  Yes Armbruster, Willaim Rayas, MD  dutasteride (AVODART) 0.5 MG capsule Take 0.5 mg by mouth daily. 12/31/22  Yes [provider]  fluticasone (FLONASE) 50 MCG/ACT nasal spray SPRAY 2 SPRAYS INTO EACH NOSTRIL EVERY DAY   Yes [provider]  Fluticasone-Umeclidin-Vilant (TRELEGY ELLIPTA) 100-62.5-25 MCG/ACT AEPB INHALE 1 PUFF BY MOUTH EVERY DAY 07/15/22  Yes Parrett, Tammy S, NP  glucosamine-chondroitin 500-400 MG tablet Take 3 tablets by mouth daily. Patient taking differently: Take 2 tablets by mouth daily. 02/15/18  Yes Olive Bass, FNP  hydrochlorothiazide (MICROZIDE) 12.5 MG capsule TAKE 1 CAPSULE BY MOUTH EVERY DAY 10/27/22  Yes de Peru, Raymond J, MD  losartan (COZAAR) 100 MG tablet Take 1 tablet (100 mg total) by mouth daily. 01/26/23  Yes de [place], Raymond J, MD  predniSONE (STERAPRED UNI-PAK 21 TAB) 10 MG (21) TBPK tablet Take by mouth daily. Take 6 tabs by mouth daily  for 2 days, then 5 tabs for 2 days, then 4 tabs for 2 days, then 3 tabs for 2 days, 2 tabs for 2 days, then 1 tab by mouth daily for 2 days 01/29/23  Yes Kenneisha Cochrane, Britta Mccreedy, MD  rosuvastatin (CRESTOR) 5 MG tablet TAKE 1 TABLET (5 MG TOTAL) BY MOUTH DAILY. 10/03/22  Yes de [place], Raymond J, MD  tamsulosin (FLOMAX)  0.4 MG CAPS capsule Take 0.4 mg by mouth daily.   Yes [provider]  methocarbamol (ROBAXIN) 500 MG tablet Take 1 tablet (500 mg total) by mouth every 8 (eight) hours as needed for muscle spasms. 11/24/22   Alyson Reedy, FNP    Family History Family History  Problem Relation Age of Onset   Leukemia Mother    Throat cancer Father    Esophageal cancer Father    Cancer Father        esophageal cancer   Breast cancer Sister    Cancer Sister    Prostate cancer Brother  Melanoma Brother    Colon cancer Neg Hx    Stomach cancer Neg Hx    Pancreatic cancer Neg Hx    Liver disease Neg Hx     Social History Social History   Tobacco Use   Smoking status: Former    Current packs/day: 0.00    Average packs/day: 1.5 packs/day for 43.0 years (64.5 ttl pk-yrs)    Types: Cigarettes    Start date: 02/22/1960    Quit date: 02/22/2003    Years since quitting: 19.9   Smokeless tobacco: Never  Vaping Use   Vaping status: Never Used  Substance Use Topics   Alcohol use: Not Currently    Comment: 1988   Drug use: Not Currently    Types: Cocaine, Heroin    Comment: 26 years ago cocaine, heroin. methadone     Allergies   Patient has no known allergies.   Review of Systems Review of Systems As per HPI  Physical Exam Triage Vital Signs ED Triage Vitals  Encounter Vitals Group     BP 01/29/23 0839 (!) 152/77     Systolic BP Percentile --      Diastolic BP Percentile --      Pulse Rate 01/29/23 0839 (!) 57     Resp 01/29/23 0839 20     Temp 01/29/23 0839 97.8 F (36.6 C)     Temp Source 01/29/23 0839 Oral     SpO2 01/29/23 0839 94 %     Weight --      Height --      Head Circumference --      Peak Flow --      Pain Score 01/29/23 0835 0     Pain Loc --      Pain Education --      Exclude from Growth Chart --    No data found.  Updated Vital Signs BP (!) 152/77 (BP Location: Left Arm)   Pulse (!) 57   Temp 97.8 F (36.6 C) (Oral)   Resp 20   SpO2 94%    Visual Acuity Right Eye Distance:   Left Eye Distance:   Bilateral Distance:    Right Eye Near:   Left Eye Near:    Bilateral Near:     Physical Exam Constitutional:      Appearance: Normal appearance.  HENT:     Right Ear: Tympanic membrane normal.     Left Ear: Tympanic membrane normal.  Cardiovascular:     Rate and Rhythm: Normal rate and regular rhythm.     Pulses: Normal pulses.     Heart sounds: Normal heart sounds.  Pulmonary:     Effort: Pulmonary effort is normal.     Breath sounds: Wheezing and rales present. No rhonchi.  Neurological:     Mental Status: He is alert.      UC Treatments / Results  Labs (all labs ordered are listed, but only abnormal results are displayed) Labs Reviewed - No data to display  EKG   Radiology No results found.  Procedures Procedures (including critical care time)  Medications Ordered in UC Medications - No data to display  Initial Impression / Assessment and Plan / UC Course  I have reviewed the triage vital signs and the nursing notes.  Pertinent labs & imaging results that were available during my care of the patient were reviewed by me and considered in my medical decision making (see chart for details).     1.  COPD with  acute exacerbation: Tapering dose of prednisone Patient is advised to continue using albuterol as needed Azithromycin for 5 days Patient is advised to maintain adequate hydration Return precautions given. Final Clinical Impressions(s) / UC Diagnoses   Final diagnoses:  COPD with acute exacerbation [facility])     Discharge Instructions      Please maintain adequate hydration Please take medications as prescribed Continue your inhalers Your lung exam is reassuring No indication for chest x-ray at this time Please return to urgent care if you have any worsening symptoms.   ED Prescriptions     Medication Sig Dispense Auth. Provider   predniSONE (STERAPRED UNI-PAK 21 TAB) 10 MG (21)  TBPK tablet Take by mouth daily. Take 6 tabs by mouth daily  for 2 days, then 5 tabs for 2 days, then 4 tabs for 2 days, then 3 tabs for 2 days, 2 tabs for 2 days, then 1 tab by mouth daily for 2 days 42 tablet Tayvien Kane, Britta Mccreedy, MD   azithromycin (ZITHROMAX Z-PAK) 250 MG tablet Please take 2 tablets on day 1 and then take 1 tablet orally daily on days 2-5. 6 tablet Nalanie Winiecki, Britta Mccreedy, MD      PDMP not reviewed this encounter.   Merrilee Jansky, MD 01/31/23 [phone]

## 2023-02-04 ENCOUNTER — Ambulatory Visit: Payer: PPO | Admitting: Emergency Medicine

## 2023-02-04 ENCOUNTER — Encounter: Payer: Self-pay | Admitting: Emergency Medicine

## 2023-02-04 VITALS — BP 126/70 | HR 72 | Temp 98.9°F | Ht 69.5 in | Wt 174.0 lb

## 2023-02-04 DIAGNOSIS — J449 Chronic obstructive pulmonary disease, unspecified: Secondary | ICD-10-CM

## 2023-02-04 NOTE — Assessment & Plan Note (Signed)
Please continue Trelegy 1 elation once daily.  Rinse and gargle after using. Keep your albuterol available to use 2 puffs when you needed for shortness of breath, chest tightness, wheezing.  Agree with using 2 puffs prior to exercise as well. Your COPD puts you at moderately increased risk for shoulder surgery if this is done under general anesthesia.  Risks include prolonged mechanical ventilation, prolonged hospitalization.  That said there is nothing to preclude you from proceeding as long as you are not having flaring symptoms.  If you are having flaring symptoms, are requiring prednisone or antibiotics, then your surgery should be postponed. Follow with Dr Delton Coombes or Tammy Parrett in 1 year

## 2023-02-04 NOTE — Progress Notes (Signed)
Subjective:    Patient ID: George Watson, male    DOB: 12-04-1945, 77 y.o.   MRN: [id number]  HPI 77 year old former smoker (43 pack years), history of hypertension, GERD, hep C, prostate cancer. Was treated with INH for latent TB in 1991.  He was diagnosed with COPD many years ago after a hospitalization for a PNA. He has been on BD's. He averages about 2-3 AE's a year when he requires pred - last was earlier this year.   Has been on Trelegy before but stopped due to insurance. He is currently on Anoro, has albuterol which he uses before exercise but rarely otherwise.   He reports that he exercises, uses his SABA before. Rarely otherwise. Minimal cough or sputum production. Hears wheeze frequently.    ROV 02/04/23 --76 year old gentleman, former smoker (40+ pack years) with hypertension, GERD, hep C, prostate cancer, latent TB (treated with INH in 1991), COPD.  He has been managed on Trelegy.  He was last seen in our office by Tammy Parrett 06/2022.  He has fairly frequent acute exacerbations that have required prednisone and was in fact in the ED on 01/29/2023.  He is planning to have shoulder replacement surgery. Today he reports that he is feeling better - had SOB, wheeze, minimal cough. Some mucous production. Uses albuterol before exercise daily, then rarely otherwise as long as he is not flaring. He is hoping for shoulder sgy this fall w Dr Rennis Chris.   Pulmonary function testing 02/13/2020 reviewed by me show mixed obstruction and restriction with a moderately decreased FEV1 2.63 L (76% predicted), normal lung volumes, normal diffusion capacity   Review of Systems As above  Past Medical History:  Diagnosis Date   Aortic aneurysm (HCC)    Arthritis    BPH (benign prostatic hypertrophy)    Cataract 2015   Cirrhosis (HCC)    COPD (chronic obstructive pulmonary disease) (HCC)    DDD (degenerative disc disease), lumbosacral    Dyspnea    Elevated PSA    Emphysema of lung (HCC)     Essential hypertension, benign    diet controlled   Fracture of shaft of clavicle    GERD (gastroesophageal reflux disease)    H/O drug abuse (HCC)    multisubstance   Hepatitis B    Hepatitis C 10/1996   HSV-2 (herpes simplex virus 2) infection    Hx of biopsy 08/2004   liver   Hx of colonic polyps 2022   Hyperlipidemia    Inguinal hernia    right   Other and unspecified hyperlipidemia    Prostate cancer (HCC)    Substance abuse (HCC)    Tuberculosis      Family History  Problem Relation Age of Onset   Leukemia Mother    Throat cancer Father    Esophageal cancer Father    Cancer Father        esophageal cancer   Breast cancer Sister    Cancer Sister    Prostate cancer Brother    Melanoma Brother    Colon cancer Neg Hx    Stomach cancer Neg Hx    Pancreatic cancer Neg Hx    Liver disease Neg Hx      Social History   Socioeconomic History   Marital status: Married    Spouse name: Not on file   Number of children: 1   Years of education: Not on file   Highest education level: Some college, no degree  Occupational History  Occupation: Hospital doctor   Occupation: Retired     Associate Professor: Engineer, water    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 methadone   Sexual activity: Not Currently    Comment: number of sex partners in the last 12 months  1  Other Topics Concern   Not on file  Social History Narrative      Married to [employer]. Exercise cardio daily for 30 minutes. Education: [employer].   Social Determinants  of Health   Financial Resource Strain: Low Risk  (11/20/2022)   Overall Financial Resource Strain (CARDIA)    Difficulty of Paying Living Expenses: Not hard at all  Food Insecurity: No Food Insecurity (11/20/2022)   Hunger Vital Sign    Worried About Running Out of Food in the Last Year: Never true    Ran Out of Food in the Last Year: Never true  Transportation Needs: No Transportation Needs (11/20/2022)   PRAPARE - Administrator, Civil Service (Medical): No    Lack of Transportation (Non-Medical): No  Physical Activity: Sufficiently Active (11/20/2022)   Exercise Vital Sign    Days of Exercise per Week: 7 days    Minutes of Exercise per Session: 60 min  Stress: No Stress Concern Present (11/20/2022)   [employer] of Occupational Health - Occupational Stress Questionnaire    Feeling of Stress : Not at all  Social Connections: Moderately Integrated (11/20/2022)   Social Connection and Isolation Panel [NHANES]    Frequency of Communication with Friends and Family: More than three times a week    Frequency of Social Gatherings with Friends and Family: Three times a week    Attends Religious Services: Never    Active Member of Clubs or Organizations: Yes    Attends Banker Meetings: Never    Marital Status: Married  Catering manager Violence: Not At Risk (05/02/2022)   Humiliation, Afraid, Rape, and Kick questionnaire    Fear of Current or Ex-Partner: No    Emotionally Abused: No    Physically Abused: No    Sexually Abused: No    Has worked in [employer] Has lived in [place], [place], [place] and [place] No there inhaled exposures except tobacco and cocaine (remote)  No Known Allergies   Outpatient Medications Prior to Visit  Medication Sig Dispense Refill   albuterol (VENTOLIN HFA) 108 (90 Base) MCG/ACT inhaler INHALE 2 PUFFS INTO THE LUNGS EVERY 6 HOURS AS NEEDED FOR SHORTNESS OF BREATH 18 each 1   aspirin EC 81 MG EC tablet Take 1 tablet (81 mg total) by mouth  daily.     azithromycin (ZITHROMAX Z-PAK) 250 MG tablet Please take 2 tablets on day 1 and then take 1 tablet orally daily on days 2-5. 6 tablet 0   clobetasol cream (TEMOVATE) 0.05 % 1 APPLICATION EXTERNALLY TWICE A DAY 30 DAYS     diazepam (VALIUM) 5 MG tablet Take 5 mg  by mouth daily as needed.     diphenoxylate-atropine (LOMOTIL) 2.5-0.025 MG tablet TAKE 1 TABLET BY MOUTH FOUR TIMES A DAY AS NEEDED FOR DIARRHEA OR LOOSE STOOLS 60 tablet 1   dutasteride (AVODART) 0.5 MG capsule Take 0.5 mg by mouth daily.     fluticasone (FLONASE) 50 MCG/ACT nasal spray SPRAY 2 SPRAYS INTO EACH NOSTRIL EVERY DAY     Fluticasone-Umeclidin-Vilant (TRELEGY ELLIPTA) 100-62.5-25 MCG/ACT AEPB INHALE 1 PUFF BY MOUTH EVERY DAY 60 each 5   glucosamine-chondroitin 500-400 MG tablet Take 3 tablets by mouth daily. (Patient taking differently: Take 2 tablets by mouth daily.)     hydrochlorothiazide (MICROZIDE) 12.5 MG capsule TAKE 1 CAPSULE BY MOUTH EVERY DAY 90 capsule 3   losartan (COZAAR) 100 MG tablet Take 1 tablet (100 mg total) by mouth daily. 30 tablet 0   methocarbamol (ROBAXIN) 500 MG tablet Take 1 tablet (500 mg total) by mouth every 8 (eight) hours as needed for muscle spasms. 30 tablet 0   predniSONE (STERAPRED UNI-PAK 21 TAB) 10 MG (21) TBPK tablet Take by mouth daily. Take 6 tabs by mouth daily  for 2 days, then 5 tabs for 2 days, then 4 tabs for 2 days, then 3 tabs for 2 days, 2 tabs for 2 days, then 1 tab by mouth daily for 2 days 42 tablet 0   rosuvastatin (CRESTOR) 5 MG tablet TAKE 1 TABLET (5 MG TOTAL) BY MOUTH DAILY. 90 tablet 0   tamsulosin (FLOMAX) 0.4 MG CAPS capsule Take 0.4 mg by mouth daily.     No facility-administered medications prior to visit.        Objective:   Physical Exam Vitals:   02/04/23 1326  BP: 126/70  Pulse: 72  Temp: 98.9 F (37.2 C)  TempSrc: Oral  SpO2: 95%  Weight: 174 lb (78.9 kg)  Height: 5' 9.5" (1.765 m)    Gen: Pleasant, well-nourished, in no distress,   normal affect  ENT: No lesions,  mouth clear,  oropharynx clear, no postnasal drip  Neck: No JVD, no stridor  Lungs: No use of accessory muscles, he does have some scattered expiratory rhonchi  Cardiovascular: RRR, heart sounds normal, no murmur or gallops, no peripheral edema  Musculoskeletal: No deformities, no cyanosis or clubbing  Neuro: alert, awake, non focal  Skin: Warm, no lesions or rash      Assessment & Plan:  COPD (chronic obstructive pulmonary disease) Please continue Trelegy 1 elation once daily.  Rinse and gargle after using. Keep your albuterol available to use 2 puffs when you needed for shortness of breath, chest tightness, wheezing.  Agree with using 2 puffs prior to exercise as well.  Your COPD puts you at moderately increased risk for shoulder surgery if this is done under general anesthesia.  Risks include prolonged mechanical ventilation, prolonged hospitalization.  That said there is nothing to preclude you from proceeding as long as you are not having flaring symptoms.  If you are having flaring symptoms, are requiring prednisone or antibiotics, then your surgery should be postponed. Follow with Dr Delton Coombes or Rubye Oaks in 1 year  Levy Pupa, MD, PhD 02/04/2023, 1:46 PM  Pulmonary and Critical Care [phone] or if no answer [phone]

## 2023-02-04 NOTE — Patient Instructions (Signed)
Please continue Trelegy 1 elation once daily.  Rinse and gargle after using. Keep your albuterol available to use 2 puffs when you needed for shortness of breath, chest tightness, wheezing.  Agree with using 2 puffs prior to exercise as well. Your COPD puts you at moderately increased risk for shoulder surgery if this is done under general anesthesia.  Risks include prolonged mechanical ventilation, prolonged hospitalization.  That said there is nothing to preclude you from proceeding as long as you are not having flaring symptoms.  If you are having flaring symptoms, are requiring prednisone or antibiotics, then your surgery should be postponed. Follow with Dr Delton Coombes or Tammy Parrett in 1 year

## 2023-02-17 ENCOUNTER — Ambulatory Visit (HOSPITAL_COMMUNITY)
Admission: RE | Admit: 2023-02-17 | Discharge: 2023-02-17 | Disposition: A | Discharge status: Home / Self Care | Payer: PPO | Source: Ambulatory Visit | Attending: Surgery | Admitting: Surgery

## 2023-02-17 DIAGNOSIS — K7460 Unspecified cirrhosis of liver: Secondary | ICD-10-CM | POA: Diagnosis not present

## 2023-02-17 DIAGNOSIS — I700 Atherosclerosis of aorta: Secondary | ICD-10-CM | POA: Diagnosis not present

## 2023-02-17 DIAGNOSIS — C220 Liver cell carcinoma: Secondary | ICD-10-CM | POA: Insufficient documentation

## 2023-02-17 DIAGNOSIS — N289 Disorder of kidney and ureter, unspecified: Secondary | ICD-10-CM | POA: Diagnosis not present

## 2023-02-17 MED ORDER — GADOBUTROL 1 MMOL/ML IV SOLN
7.5000 mL | Freq: Once | INTRAVENOUS | Status: AC | PRN
  Administered 2023-02-17: 7.5 mL via INTRAVENOUS

## 2023-02-19 ENCOUNTER — Encounter: Payer: Self-pay | Admitting: Emergency Medicine

## 2023-02-19 DIAGNOSIS — J418 Mixed simple and mucopurulent chronic bronchitis: Secondary | ICD-10-CM

## 2023-02-20 ENCOUNTER — Other Ambulatory Visit (HOSPITAL_BASED_OUTPATIENT_CLINIC_OR_DEPARTMENT_OTHER): Payer: Self-pay | Admitting: Family Medicine

## 2023-02-20 DIAGNOSIS — I10 Essential (primary) hypertension: Secondary | ICD-10-CM

## 2023-02-20 MED ORDER — ALBUTEROL SULFATE HFA 108 (90 BASE) MCG/ACT IN AERS: 2.0000 | INHALATION_SPRAY | Freq: Four times a day (QID) | RESPIRATORY_TRACT | 5 refills | Status: DC | PRN

## 2023-02-21 ENCOUNTER — Other Ambulatory Visit (HOSPITAL_BASED_OUTPATIENT_CLINIC_OR_DEPARTMENT_OTHER): Payer: Self-pay | Admitting: Family Medicine

## 2023-02-21 DIAGNOSIS — E785 Hyperlipidemia, unspecified: Secondary | ICD-10-CM

## 2023-02-24 ENCOUNTER — Other Ambulatory Visit (HOSPITAL_BASED_OUTPATIENT_CLINIC_OR_DEPARTMENT_OTHER): Payer: Self-pay

## 2023-02-24 ENCOUNTER — Encounter (HOSPITAL_BASED_OUTPATIENT_CLINIC_OR_DEPARTMENT_OTHER): Payer: Self-pay | Admitting: Family Medicine

## 2023-02-24 DIAGNOSIS — E785 Hyperlipidemia, unspecified: Secondary | ICD-10-CM

## 2023-02-24 MED ORDER — ROSUVASTATIN CALCIUM 5 MG PO TABS: 5.0000 mg | ORAL_TABLET | Freq: Every day | ORAL | 0 refills | Status: DC

## 2023-02-26 ENCOUNTER — Other Ambulatory Visit: Payer: Self-pay | Admitting: Gastroenterology

## 2023-02-26 ENCOUNTER — Encounter: Payer: Self-pay | Admitting: Gastroenterology

## 2023-03-02 ENCOUNTER — Ambulatory Visit (INDEPENDENT_AMBULATORY_CARE_PROVIDER_SITE_OTHER): Payer: PPO | Admitting: Family Medicine

## 2023-03-02 ENCOUNTER — Encounter: Payer: Self-pay | Admitting: Family Medicine

## 2023-03-02 VITALS — BP 124/61 | HR 67 | Temp 97.5°F | Ht 69.5 in | Wt 172.0 lb

## 2023-03-02 DIAGNOSIS — C61 Malignant neoplasm of prostate: Secondary | ICD-10-CM

## 2023-03-02 DIAGNOSIS — M47816 Spondylosis without myelopathy or radiculopathy, lumbar region: Secondary | ICD-10-CM

## 2023-03-02 DIAGNOSIS — K589 Irritable bowel syndrome without diarrhea: Secondary | ICD-10-CM | POA: Diagnosis not present

## 2023-03-02 DIAGNOSIS — M1990 Unspecified osteoarthritis, unspecified site: Secondary | ICD-10-CM | POA: Insufficient documentation

## 2023-03-02 DIAGNOSIS — Z8619 Personal history of other infectious and parasitic diseases: Secondary | ICD-10-CM

## 2023-03-02 DIAGNOSIS — C220 Liver cell carcinoma: Secondary | ICD-10-CM

## 2023-03-02 DIAGNOSIS — F40298 Other specified phobia: Secondary | ICD-10-CM | POA: Diagnosis not present

## 2023-03-02 DIAGNOSIS — R7303 Prediabetes: Secondary | ICD-10-CM

## 2023-03-02 DIAGNOSIS — N400 Enlarged prostate without lower urinary tract symptoms: Secondary | ICD-10-CM | POA: Diagnosis not present

## 2023-03-02 DIAGNOSIS — K7460 Unspecified cirrhosis of liver: Secondary | ICD-10-CM

## 2023-03-02 DIAGNOSIS — I10 Essential (primary) hypertension: Secondary | ICD-10-CM

## 2023-03-02 DIAGNOSIS — E785 Hyperlipidemia, unspecified: Secondary | ICD-10-CM | POA: Diagnosis not present

## 2023-03-02 DIAGNOSIS — J449 Chronic obstructive pulmonary disease, unspecified: Secondary | ICD-10-CM

## 2023-03-02 LAB — COMPREHENSIVE METABOLIC PANEL
ALT: 28 U/L (ref 0–53)
AST: 22 U/L (ref 0–37)
Albumin: 4.5 g/dL (ref 3.5–5.2)
Alkaline Phosphatase: 49 U/L (ref 39–117)
BUN: 24 mg/dL — ABNORMAL HIGH (ref 6–23)
CO2: 26 mEq/L (ref 19–32)
Calcium: 10.3 mg/dL (ref 8.4–10.5)
Chloride: 104 mEq/L (ref 96–112)
Creatinine, Ser: 1.06 mg/dL (ref 0.40–1.50)
GFR: 68.47 mL/min (ref >60.00)
Glucose, Bld: 120 mg/dL — ABNORMAL HIGH (ref 70–99)
Potassium: 4.4 mEq/L (ref 3.5–5.1)
Sodium: 137 mEq/L (ref 135–145)
Total Bilirubin: 0.7 mg/dL (ref 0.2–1.2)
Total Protein: 7 g/dL (ref 6.0–8.3)

## 2023-03-02 LAB — CBC
HCT: 47.1 % (ref 39.0–52.0)
Hemoglobin: 15.4 g/dL (ref 13.0–17.0)
MCHC: 32.7 g/dL (ref 30.0–36.0)
MCV: 94 fl (ref 78.0–100.0)
Platelets: 230 10*3/uL (ref 150.0–400.0)
RBC: 5.01 Mil/uL (ref 4.22–5.81)
RDW: 13.3 % (ref 11.5–15.5)
WBC: 9.5 10*3/uL (ref 4.0–10.5)

## 2023-03-02 LAB — LIPID PANEL
Cholesterol: 166 mg/dL (ref 0–200)
HDL: 40.6 mg/dL (ref >39.00)
LDL Cholesterol: 95 mg/dL (ref 0–99)
NonHDL: 125.71
Total CHOL/HDL Ratio: 4
Triglycerides: 153 mg/dL — ABNORMAL HIGH (ref 0.0–149.0)
VLDL: 30.6 mg/dL (ref 0.0–40.0)

## 2023-03-02 LAB — HEMOGLOBIN A1C: Hgb A1c MFr Bld: 6.7 % — ABNORMAL HIGH (ref 4.6–6.5)

## 2023-03-02 LAB — TSH: TSH: 2.14 u[IU]/mL (ref 0.35–5.50)

## 2023-03-02 NOTE — Progress Notes (Signed)
George Watson is a 77 y.o. male who presents today for an office visit.  He is a new patient.   Assessment/Plan:  Chronic Problems Addressed Today: Essential hypertension Blood pressure at goal today on current regimen HCTZ 12.5 mg daily and losartan 100 mg daily.  Check labs.  History of hepatitis C Definitively treated in 2014.  We will take off problem list and add to history.  IBS (irritable bowel syndrome) Following with GI for this.  May have been exacerbated by COVID a few years ago.  Currently on Lomotil as needed.  This works well.  Lumbar spondylosis Following with orthopedics.  He does use Robaxin as needed which works well.  Dyslipidemia On Crestor 5 mg daily.  Tolerating well.  Check lipids.  COPD (chronic obstructive pulmonary disease) Follows with pulmonology for this.  On Trelegy once daily and albuterol as needed.  Mild rhonchi on exam today otherwise stable exam.  Benign prostatic hyperplasia Following with urology.  On dutasteride 0.5 mg daily and tamsulosin 0.4 mg daily.  Prostate cancer [facility]) Follows with urology for active surveillance.  Prediabetes Check A1c today.  He is doing a great job with diet and exercise.  Specific phobia He has a fear of flying and also claustrophobia.  Does use Valium very sporadically as needed for flying or MRIs.  Does not need any refills today.  Hepatocellular carcinoma (HCC) s/p resection 2023 Status post resection in 2023.  Follows with hepatology routinely.  No signs of recurrence.  Cirrhosis (HCC) Follows with hepatology.  Osteoarthritis Follows with orthopedics.  Having more issues with shoulder pain recently.  Will be having upcoming reverse shoulder surgery soon.     Subjective:  HPI:  See A/P for status of chronic conditions.  Patient is here today as a new patient to establish care.  His medical history is significant for essential hypertension, COPD, prediabetes, dyslipidemia, BPH prostate cancer and  hepatocellular carcinoma due to chronic hepatitis C.  Currently is following with cardiology, pulmonology, orthopedics, urology, and gastroenterology.  Doing well with current medication regimen.  He has no acute concerns today.  He would like to have blood work.  He was seeing his previous PCP every 3 months for labs and would like to continue with his routine.  ROS: Per HPI, otherwise a complete review of systems was negative.   PMH:  The following were reviewed and entered/updated in epic: Past Medical History:  Diagnosis Date   Aortic aneurysm (HCC)    Arthritis    BPH (benign prostatic hypertrophy)    Cataract 2015   Cirrhosis (HCC)    COPD (chronic obstructive pulmonary disease) (HCC)    DDD (degenerative disc disease), lumbosacral    Dyspnea    Elevated PSA    Emphysema of lung (HCC)    Essential hypertension, benign    diet controlled   Fracture of shaft of clavicle    GERD (gastroesophageal reflux disease)    H/O drug abuse (HCC)    multisubstance   Hepatitis B    Hepatitis C 10/1996   History of hepatitis C 10/19/1996   Dr. [name] with [facility] Treated in 2014. Followed yearly.      HSV-2 (herpes simplex virus 2) infection    Hx of biopsy 08/2004   liver   Hx of colonic polyps 2022   Hyperlipidemia    Inguinal hernia    right   Other and unspecified hyperlipidemia    Prostate cancer (HCC)    Substance abuse (  HCC)    Tuberculosis    Patient Active Problem List   Diagnosis Date Noted   IBS (irritable bowel syndrome) 03/02/2023   Specific phobia 03/02/2023   Osteoarthritis 03/02/2023   Essential hypertension 11/24/2022   Hepatocellular carcinoma (HCC) s/p resection 2023 10/14/2021   Lung nodule 10/05/2020   Dilated aortic root (HCC) 06/13/2019   Prediabetes 01/06/2017   Atherosclerosis of aorta (HCC) 07/09/2016   Dyslipidemia    HSV-2 (herpes simplex virus 2) infection    COPD (chronic obstructive pulmonary disease) (HCC)    Benign prostatic  hyperplasia    Lumbar spondylosis    Prostate cancer (HCC)    Cirrhosis (HCC) 05/21/1997   Past Surgical History:  Procedure Laterality Date   BACK SURGERY     CERVICAL SPINE SURGERY     EYE SURGERY  3/20   fracture ribs     3   HERNIA REPAIR     INGUINAL HERNIA REPAIR  07/01/2012   Procedure: HERNIA REPAIR INGUINAL ADULT;  Surgeon: Lodema Pilot, DO;  Location: [facility] ORS;  Service: General;  Laterality: Right;  with Mesh   LAPAROSCOPIC LIVER ULTRASOUND N/A 11/21/2021   Procedure: LAPAROSCOPIC LIVER ULTRASOUND;  Surgeon: Fritzi Mandes, MD;  Location: [facility] OR;  Service: General;  Laterality: N/A;   LAPAROSCOPIC PARTIAL HEPATECTOMY Left 11/21/2021   Procedure: LAPAROSCOPIC PARTIAL HEPATECTOMY;  Surgeon: Fritzi Mandes, MD;  Location: [facility] OR;  Service: General;  Laterality: Left;   LAPAROSCOPY N/A 11/21/2021   Procedure: STAGING LAPAROSCOPY;  Surgeon: Fritzi Mandes, MD;  Location: [facility] OR;  Service: General;  Laterality: N/A;   left knee meniscus repair  07/21/1990   right rotator cuff  07/21/1990   right shoulder arthroscopy  07/21/2009   SPINE SURGERY  10/14    Family History  Problem Relation Age of Onset   Leukemia Mother    Throat cancer Father    Esophageal cancer Father    Cancer Father        esophageal cancer   Breast cancer Sister    Cancer Sister    Prostate cancer Brother    Melanoma Brother    Colon cancer Neg Hx    Stomach cancer Neg Hx    Pancreatic cancer Neg Hx    Liver disease Neg Hx     Medications- reviewed and updated Current Outpatient Medications  Medication Sig Dispense Refill   albuterol (VENTOLIN HFA) 108 (90 Base) MCG/ACT inhaler Inhale 2 puffs into the lungs every 6 (six) hours as needed for wheezing or shortness of breath (AS NEEDED FOR SHORTNESS OF BREATH). 18 each 5   aspirin EC 81 MG EC tablet Take 1 tablet (81 mg total) by mouth daily.     clobetasol cream (TEMOVATE) 0.05 % 1 APPLICATION EXTERNALLY TWICE A DAY 30 DAYS     diphenoxylate-atropine  (LOMOTIL) 2.5-0.025 MG tablet TAKE 1 TABLET BY MOUTH 4 TIMES A DAY AS NEEDED FOR DIARRHEA/LOOSE STOOLS 60 tablet 1   dutasteride (AVODART) 0.5 MG capsule Take 0.5 mg by mouth daily.     fluticasone (FLONASE) 50 MCG/ACT nasal spray SPRAY 2 SPRAYS INTO EACH NOSTRIL EVERY DAY     Fluticasone-Umeclidin-Vilant (TRELEGY ELLIPTA) 100-62.5-25 MCG/ACT AEPB INHALE 1 PUFF BY MOUTH EVERY DAY 60 each 5   glucosamine-chondroitin 500-400 MG tablet Take 3 tablets by mouth daily. (Patient taking differently: Take 2 tablets by mouth daily.)     hydrochlorothiazide (MICROZIDE) 12.5 MG capsule TAKE 1 CAPSULE BY MOUTH EVERY DAY 90 capsule 3   losartan (  COZAAR) 100 MG tablet Take 1 tablet (100 mg total) by mouth daily. 30 tablet 0   methocarbamol (ROBAXIN) 500 MG tablet Take 1 tablet (500 mg total) by mouth every 8 (eight) hours as needed for muscle spasms. 30 tablet 0   rosuvastatin (CRESTOR) 5 MG tablet Take 1 tablet (5 mg total) by mouth daily. 30 tablet 0   tamsulosin (FLOMAX) 0.4 MG CAPS capsule Take 0.4 mg by mouth daily.     diazepam (VALIUM) 5 MG tablet Take 5 mg by mouth daily as needed. (Patient not taking: Reported on 03/02/2023)     No current facility-administered medications for this visit.    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 methadone   Sexual activity: Not Currently    Comment: number of sex partners in the last 12 months  1  Other Topics Concern   Not on file  Social History Narrative      Married to [employer]. Exercise cardio daily for 30 minutes. Education: [employer].   Social Determinants of Health   Financial Resource Strain: Low Risk  (11/20/2022)   Overall Financial Resource Strain (CARDIA)    Difficulty of Paying Living Expenses: Not hard at all  Food Insecurity: No Food Insecurity (11/20/2022)   Hunger Vital Sign    Worried About Running Out of Food in the Last Year: Never true    Ran Out of Food in the Last Year: Never true  Transportation Needs: No Transportation Needs (11/20/2022)   PRAPARE - Administrator, Civil Service (Medical): No    Lack of Transportation (Non-Medical): No  Physical Activity: Sufficiently Active (11/20/2022)   Exercise Vital Sign    Days of Exercise per Week: 7 days    Minutes of Exercise per Session: 60 min  Stress: No Stress Concern Present (11/20/2022)   [employer] of Occupational Health - Occupational Stress Questionnaire    Feeling of Stress : Not at all  Social Connections: Moderately Integrated (11/20/2022)   Social Connection and Isolation Panel [NHANES]    Frequency of Communication with Friends and Family: More than three times a week    Frequency of Social Gatherings with Friends and Family: Three times a week    Attends Religious Services: Never    Active Member of Clubs or Organizations: Yes    Attends Banker Meetings: Never    Marital Status: Married          Objective:  Physical Exam: BP 124/61   Pulse 67  Temp (!) 97.5 F (36.4 C) (Temporal)   Ht 5' 9.5" (1.765 m)   Wt 172 lb (78 kg)   SpO2 98%   BMI 25.04 kg/m   Gen: No acute distress, resting comfortably CV: Regular  rate and rhythm with no murmurs appreciated Pulm: Normal work of breathing, occasional rhonchi noted.  Otherwise clear bilaterally. Neuro: Grossly normal, moves all extremities Psych: Normal affect and thought content  Time Spent: 45 minutes of total time was spent on the date of the encounter performing the following actions: chart review prior to seeing the patient including recent visits with previous PCP and specialists, obtaining history, performing a medically necessary exam, counseling on the treatment plan, placing orders, and documenting in our EHR.        Katina Degree. Jimmey Ralph, MD 03/02/2023 11:42 AM

## 2023-03-02 NOTE — Assessment & Plan Note (Signed)
Check A1c today.  He is doing a great job with diet and exercise.

## 2023-03-02 NOTE — Assessment & Plan Note (Signed)
Follows with urology for active surveillance.

## 2023-03-02 NOTE — Assessment & Plan Note (Signed)
Follows with pulmonology for this.  On Trelegy once daily and albuterol as needed.  Mild rhonchi on exam today otherwise stable exam.

## 2023-03-02 NOTE — Assessment & Plan Note (Signed)
Following with urology.  On dutasteride 0.5 mg daily and tamsulosin 0.4 mg daily.

## 2023-03-02 NOTE — Assessment & Plan Note (Addendum)
Status post resection in 2023.  Follows with hepatology routinely.  No signs of recurrence.

## 2023-03-02 NOTE — Assessment & Plan Note (Signed)
Definitively treated in 2014.  We will take off problem list and add to history.

## 2023-03-02 NOTE — Assessment & Plan Note (Signed)
Blood pressure at goal today on current regimen HCTZ 12.5 mg daily and losartan 100 mg daily.  Check labs.

## 2023-03-02 NOTE — Assessment & Plan Note (Signed)
On Crestor 5 mg daily.  Tolerating well.  Check lipids.

## 2023-03-02 NOTE — Assessment & Plan Note (Signed)
Following with orthopedics.  He does use Robaxin as needed which works well.

## 2023-03-02 NOTE — Patient Instructions (Signed)
It was very nice to see you today!  It was nice to meet you today.  Please keep up the great work.  No changes today.  Will check blood work.  Return in about 3 months (around 06/02/2023) for Follow Up.   Take care, Dr Jimmey Ralph  PLEASE NOTE:  If you had any lab tests, please let us know if you have not heard back within a few days. You may see your results on mychart before we have a chance to review them but we will give you a call once they are reviewed by [place].   If we ordered any referrals today, please let us know if you have not heard from their office within the next week.   If you had any urgent prescriptions sent in today, please check with the pharmacy within an hour of our visit to make sure the prescription was transmitted appropriately.   Please try these tips to maintain a healthy lifestyle:  Eat at least 3 REAL meals and 1-2 snacks per day.  Aim for no more than 5 hours between eating.  If you eat breakfast, please do so within one hour of getting up.   Each meal should contain half fruits/vegetables, one quarter protein, and one quarter carbs (no bigger than a computer mouse)  Cut down on sweet beverages. This includes juice, soda, and sweet tea.   Drink at least 1 glass of water with each meal and aim for at least 8 glasses per day  Exercise at least 150 minutes every week.

## 2023-03-02 NOTE — Assessment & Plan Note (Signed)
Follows with orthopedics.  Having more issues with shoulder pain recently.  Will be having upcoming reverse shoulder surgery soon.

## 2023-03-02 NOTE — Assessment & Plan Note (Signed)
Following with GI for this.  May have been exacerbated by COVID a few years ago.  Currently on Lomotil as needed.  This works well.

## 2023-03-02 NOTE — Assessment & Plan Note (Signed)
He has a fear of flying and also claustrophobia.  Does use Valium very sporadically as needed for flying or MRIs.  Does not need any refills today.

## 2023-03-02 NOTE — Assessment & Plan Note (Signed)
Follows with hepatology

## 2023-03-03 DIAGNOSIS — C220 Liver cell carcinoma: Secondary | ICD-10-CM | POA: Diagnosis not present

## 2023-03-03 DIAGNOSIS — Z09 Encounter for follow-up examination after completed treatment for conditions other than malignant neoplasm: Secondary | ICD-10-CM | POA: Diagnosis not present

## 2023-03-03 NOTE — Telephone Encounter (Signed)
Patient called back. Requests call back from [place].

## 2023-03-03 NOTE — Telephone Encounter (Signed)
See results note. 

## 2023-03-03 NOTE — Progress Notes (Signed)
His A1c is 6.7.  This is technically in the diabetic range.  He can schedule a follow-up visit to discuss medication treatment options if he is interested however if he wishes to avoid medications for now he can continue to work on diet and exercise and we can recheck this in 3 months.  The rest of his labs are all stable and we can recheck next office visit.

## 2023-03-06 ENCOUNTER — Other Ambulatory Visit (HOSPITAL_BASED_OUTPATIENT_CLINIC_OR_DEPARTMENT_OTHER): Payer: Self-pay | Admitting: Family Medicine

## 2023-03-06 DIAGNOSIS — I10 Essential (primary) hypertension: Secondary | ICD-10-CM

## 2023-03-07 ENCOUNTER — Encounter: Payer: Self-pay | Admitting: Family Medicine

## 2023-03-07 ENCOUNTER — Other Ambulatory Visit (HOSPITAL_BASED_OUTPATIENT_CLINIC_OR_DEPARTMENT_OTHER): Payer: Self-pay | Admitting: Family Medicine

## 2023-03-07 DIAGNOSIS — E785 Hyperlipidemia, unspecified: Secondary | ICD-10-CM

## 2023-03-07 DIAGNOSIS — I10 Essential (primary) hypertension: Secondary | ICD-10-CM

## 2023-03-07 DIAGNOSIS — M5137 Other intervertebral disc degeneration, lumbosacral region: Secondary | ICD-10-CM

## 2023-03-09 MED ORDER — METHOCARBAMOL 500 MG PO TABS: 500.0000 mg | ORAL_TABLET | Freq: Three times a day (TID) | ORAL | 0 refills | Status: DC | PRN

## 2023-03-09 MED ORDER — ROSUVASTATIN CALCIUM 5 MG PO TABS
5.0000 mg | ORAL_TABLET | Freq: Every day | ORAL | 0 refills | Status: DC
Start: 2023-03-09 — End: 2023-04-16

## 2023-03-09 MED ORDER — HYDROCHLOROTHIAZIDE 12.5 MG PO CAPS: 12.5000 mg | ORAL_CAPSULE | Freq: Every day | ORAL | 3 refills | Status: DC

## 2023-03-09 MED ORDER — DIAZEPAM 5 MG PO TABS: 5.0000 mg | ORAL_TABLET | Freq: Every day | ORAL | 0 refills | Status: AC | PRN

## 2023-03-10 ENCOUNTER — Encounter: Payer: Self-pay | Admitting: Family Medicine

## 2023-03-10 DIAGNOSIS — L578 Other skin changes due to chronic exposure to nonionizing radiation: Secondary | ICD-10-CM | POA: Diagnosis not present

## 2023-03-10 DIAGNOSIS — L719 Rosacea, unspecified: Secondary | ICD-10-CM | POA: Diagnosis not present

## 2023-03-10 DIAGNOSIS — L814 Other melanin hyperpigmentation: Secondary | ICD-10-CM | POA: Diagnosis not present

## 2023-03-10 DIAGNOSIS — L570 Actinic keratosis: Secondary | ICD-10-CM | POA: Diagnosis not present

## 2023-03-10 DIAGNOSIS — L821 Other seborrheic keratosis: Secondary | ICD-10-CM | POA: Diagnosis not present

## 2023-03-10 DIAGNOSIS — D229 Melanocytic nevi, unspecified: Secondary | ICD-10-CM | POA: Diagnosis not present

## 2023-04-01 ENCOUNTER — Other Ambulatory Visit (HOSPITAL_BASED_OUTPATIENT_CLINIC_OR_DEPARTMENT_OTHER): Payer: Self-pay | Admitting: Family Medicine

## 2023-04-01 DIAGNOSIS — I10 Essential (primary) hypertension: Secondary | ICD-10-CM

## 2023-04-12 ENCOUNTER — Other Ambulatory Visit: Payer: Self-pay | Admitting: Gastroenterology

## 2023-04-13 ENCOUNTER — Other Ambulatory Visit: Payer: Self-pay

## 2023-04-16 ENCOUNTER — Other Ambulatory Visit: Payer: Self-pay | Admitting: Family Medicine

## 2023-04-16 DIAGNOSIS — E785 Hyperlipidemia, unspecified: Secondary | ICD-10-CM

## 2023-04-26 ENCOUNTER — Ambulatory Visit (INDEPENDENT_AMBULATORY_CARE_PROVIDER_SITE_OTHER): Payer: PPO

## 2023-04-26 ENCOUNTER — Encounter (HOSPITAL_COMMUNITY): Payer: Self-pay

## 2023-04-26 ENCOUNTER — Ambulatory Visit (HOSPITAL_COMMUNITY)
Admission: RE | Admit: 2023-04-26 | Discharge: 2023-04-26 | Disposition: A | Discharge status: Home / Self Care | Payer: PPO | Source: Ambulatory Visit | Attending: Physician Assistant | Admitting: Physician Assistant

## 2023-04-26 VITALS — BP 149/79 | HR 70 | Temp 97.7°F | Resp 17

## 2023-04-26 DIAGNOSIS — J449 Chronic obstructive pulmonary disease, unspecified: Secondary | ICD-10-CM | POA: Diagnosis not present

## 2023-04-26 DIAGNOSIS — R0602 Shortness of breath: Secondary | ICD-10-CM

## 2023-04-26 DIAGNOSIS — J441 Chronic obstructive pulmonary disease with (acute) exacerbation: Secondary | ICD-10-CM

## 2023-04-26 DIAGNOSIS — I700 Atherosclerosis of aorta: Secondary | ICD-10-CM | POA: Diagnosis not present

## 2023-04-26 MED ORDER — METHYLPREDNISOLONE SODIUM SUCC 125 MG IJ SOLR
INTRAMUSCULAR | Status: AC
  Filled 2023-04-26: qty 2

## 2023-04-26 MED ORDER — PREDNISONE 20 MG PO TABS
40.0000 mg | ORAL_TABLET | Freq: Every day | ORAL | 0 refills | Status: DC
Start: 2023-04-26 — End: 2023-04-26

## 2023-04-26 MED ORDER — PREDNISONE 10 MG (21) PO TBPK: ORAL_TABLET | Freq: Every day | ORAL | 0 refills | Status: DC

## 2023-04-26 MED ORDER — IPRATROPIUM-ALBUTEROL 0.5-2.5 (3) MG/3ML IN SOLN
RESPIRATORY_TRACT | Status: AC
  Filled 2023-04-26: qty 3

## 2023-04-26 MED ORDER — DOXYCYCLINE HYCLATE 100 MG PO CAPS: 100.0000 mg | ORAL_CAPSULE | Freq: Two times a day (BID) | ORAL | 0 refills | Status: DC

## 2023-04-26 MED ORDER — IPRATROPIUM-ALBUTEROL 0.5-2.5 (3) MG/3ML IN SOLN
3.0000 mL | Freq: Once | RESPIRATORY_TRACT | Status: AC
  Administered 2023-04-26: 3 mL via RESPIRATORY_TRACT

## 2023-04-26 MED ORDER — METHYLPREDNISOLONE SODIUM SUCC 125 MG IJ SOLR
60.0000 mg | Freq: Once | INTRAMUSCULAR | Status: AC
  Administered 2023-04-26: 60 mg via INTRAMUSCULAR

## 2023-04-26 NOTE — ED Provider Notes (Signed)
[facility]    CSN: [id number] Arrival date & time: 04/26/23  1008      History   Chief Complaint Chief Complaint  Patient presents with   Wheezing    Chronic COPD, Emphysema - Entered by patient   Cough    HPI George Watson is a 77 y.o. male.   Patient presents today with a 2-day history of worsening COPD symptoms.  Reports increasing cough, shortness of breath, wheezing.  He often has COPD exacerbations a few times per year and states current symptoms are similar previous episodes of this condition.  Denies any congestion or sore throat.  Denies any exposure to COVID.  Has had COVID-19 vaccinations.  He does not smoke.  He was treated by our clinic in July 2024 with azithromycin and prednisone and symptoms resolved.  He has been compliant with previously prescribed medications including Trelegy Ellipta but continues to have symptoms and has been using his albuterol more frequently.  Denies any fever, chest pain, nausea, vomiting.  Denies additional antibiotics or steroids in the past 90 days for SIBO was prescribed by our clinic in July.  He does have a history of hepatocellular carcinoma but is not currently receiving active treatment; had surgical removal with clean margins and so is under surveillance.  His last metabolic panel from August showed normal liver and kidney function.    Past Medical History:  Diagnosis Date   Aortic aneurysm (HCC)    Arthritis    BPH (benign prostatic hypertrophy)    Cataract 2015   Cirrhosis (HCC)    COPD (chronic obstructive pulmonary disease) (HCC)    DDD (degenerative disc disease), lumbosacral    Dyspnea    Elevated PSA    Emphysema of lung (HCC)    Essential hypertension, benign    diet controlled   Fracture of shaft of clavicle    GERD (gastroesophageal reflux disease)    H/O drug abuse (HCC)    multisubstance   Hepatitis B    Hepatitis C 10/1996   History of hepatitis C 10/19/1996   Dr. [name] with [facility].  Treated in 2014. Followed yearly.      HSV-2 (herpes simplex virus 2) infection    Hx of biopsy 08/2004   liver   Hx of colonic polyps 2022   Hyperlipidemia    Inguinal hernia    right   Other and unspecified hyperlipidemia    Prostate cancer (HCC)    Substance abuse (HCC)    Tuberculosis     Patient Active Problem List   Diagnosis Date Noted   IBS (irritable bowel syndrome) 03/02/2023   Specific phobia 03/02/2023   Osteoarthritis 03/02/2023   Essential hypertension 11/24/2022   Hepatocellular carcinoma (HCC) s/p resection 2023 10/14/2021   Lung nodule 10/05/2020   Dilated aortic root (HCC) 06/13/2019   Prediabetes 01/06/2017   Atherosclerosis of aorta (HCC) 07/09/2016   Dyslipidemia    HSV-2 (herpes simplex virus 2) infection    COPD (chronic obstructive pulmonary disease) (HCC)    Benign prostatic hyperplasia    Lumbar spondylosis    Prostate cancer (HCC)    Cirrhosis (HCC) 05/21/1997    Past Surgical History:  Procedure Laterality Date   BACK SURGERY     CERVICAL SPINE SURGERY     EYE SURGERY  3/20   fracture ribs     3   HERNIA REPAIR     INGUINAL HERNIA REPAIR  07/01/2012   Procedure: HERNIA REPAIR INGUINAL ADULT;  Surgeon:  Lodema Pilot, DO;  Location: [facility];  Service: General;  Laterality: Right;  with Mesh   LAPAROSCOPIC LIVER ULTRASOUND N/A 11/21/2021   Procedure: LAPAROSCOPIC LIVER ULTRASOUND;  Surgeon: Fritzi Mandes, MD;  Location[facility] OR;  Service: General;  Laterality: N/A;   LAPAROSCOPIC PARTIAL HEPATECTOMY Left 11/21/2021   Procedure: LAPAROSCOPIC PARTIAL HEPATECTOMY;  Surgeon: Fritzi Mandes, MD;  Location: [facility] OR;  Service: General;  Laterality: Left;   LAPAROSCOPY N/A 11/21/2021   Procedure: STAGING LAPAROSCOPY;  Surgeon: Fritzi Mandes, MD;  Location: [facility] OR;  Service: General;  Laterality: N/A;   left knee meniscus repair  07/21/1990   right rotator cuff  07/21/1990   right shoulder arthroscopy  07/21/2009   SPINE SURGERY  10/14       Home  Medications    Prior to Admission medications   Medication Sig Start Date End Date Taking? Authorizing Provider  doxycycline (VIBRAMYCIN) 100 MG capsule Take 1 capsule (100 mg total) by mouth 2 (two) times daily. 04/26/23  Yes Kashonda Sarkisyan K, PA-C  predniSONE (STERAPRED UNI-PAK 21 TAB) 10 MG (21) TBPK tablet Take by mouth daily. Take 6 tabs by mouth daily  for 2 days, then 5 tabs for 2 days, then 4 tabs for 2 days, then 3 tabs for 2 days, 2 tabs for 2 days, then 1 tab by mouth daily for 2 days 04/26/23  Yes Talena Neira K, PA-C  albuterol (VENTOLIN HFA) 108 (90 Base) MCG/ACT inhaler Inhale 2 puffs into the lungs every 6 (six) hours as needed for wheezing or shortness of breath (AS NEEDED FOR SHORTNESS OF BREATH). 02/20/23   Leslye Peer, MD  aspirin EC 81 MG EC tablet Take 1 tablet (81 mg total) by mouth daily. 07/09/19   Black, Lesle Chris, NP  clobetasol cream (TEMOVATE) 0.05 % 1 APPLICATION EXTERNALLY TWICE A DAY 30 DAYS    [provider]  diazepam (VALIUM) 5 MG tablet Take 1 tablet (5 mg total) by mouth daily as needed. 03/09/23   Ardith Dark, MD  diphenoxylate-atropine (LOMOTIL) 2.5-0.025 MG tablet Take 1 tablet by mouth 4 (four) times daily as needed for diarrhea or loose stools. Please keep your upcoming appointment for further refills. Thank you 04/13/23   Benancio Deeds, MD  dutasteride (AVODART) 0.5 MG capsule Take 0.5 mg by mouth daily. 12/31/22   [provider]  fluticasone (FLONASE) 50 MCG/ACT nasal spray SPRAY 2 SPRAYS INTO EACH NOSTRIL EVERY DAY    [provider]  Fluticasone-Umeclidin-Vilant (TRELEGY ELLIPTA) 100-62.5-25 MCG/ACT AEPB INHALE 1 PUFF BY MOUTH EVERY DAY 07/15/22   Parrett, Virgel Bouquet, NP  glucosamine-chondroitin 500-400 MG tablet Take 3 tablets by mouth daily. Patient taking differently: Take 2 tablets by mouth daily. 02/15/18   Olive Bass, FNP  hydrochlorothiazide (MICROZIDE) 12.5 MG capsule Take 1 capsule (12.5 mg total) by mouth  daily. 03/09/23   Ardith Dark, MD  losartan (COZAAR) 100 MG tablet TAKE 1 TABLET BY MOUTH EVERY DAY 04/01/23   Ardith Dark, MD  methocarbamol (ROBAXIN) 500 MG tablet Take 1 tablet (500 mg total) by mouth every 8 (eight) hours as needed for muscle spasms. 03/09/23   Ardith Dark, MD  rosuvastatin (CRESTOR) 5 MG tablet TAKE 1 TABLET (5 MG TOTAL) BY MOUTH DAILY. 04/16/23   Ardith Dark, MD  tamsulosin (FLOMAX) 0.4 MG CAPS capsule Take 0.4 mg by mouth daily.    [provider]    Family History Family History  Problem  Relation Age of Onset   Leukemia Mother    Throat cancer Father    Esophageal cancer Father    Cancer Father        esophageal cancer   Breast cancer Sister    Cancer Sister    Prostate cancer Brother    Melanoma Brother    Colon cancer Neg Hx    Stomach cancer Neg Hx    Pancreatic cancer Neg Hx    Liver disease Neg Hx     Social History Social History   Tobacco Use   Smoking status: Former    Current packs/day: 0.00    Average packs/day: 1.5 packs/day for 43.0 years (64.5 ttl pk-yrs)    Types: Cigarettes    Start date: 02/22/1960    Quit date: 02/22/2003    Years since quitting: 20.1   Smokeless tobacco: Never  Vaping Use   Vaping status: Never Used  Substance Use Topics   Alcohol use: Not Currently    Comment: 1988   Drug use: Not Currently    Types: Cocaine, Heroin    Comment: 26 years ago cocaine, heroin. methadone     Allergies   Patient has no known allergies.   Review of Systems Review of Systems  Constitutional:  Positive for activity change. Negative for appetite change, fatigue and fever.  HENT:  Negative for congestion, sinus pressure, sneezing and sore throat.   Respiratory:  Positive for cough, shortness of breath and wheezing.   Cardiovascular:  Negative for chest pain.  Gastrointestinal:  Negative for abdominal pain, diarrhea, nausea and vomiting.     Physical Exam Triage Vital Signs ED Triage Vitals  Encounter  Vitals Group     BP 04/26/23 1031 (!) 149/79     Systolic BP Percentile --      Diastolic BP Percentile --      Pulse Rate 04/26/23 1031 70     Resp 04/26/23 1031 17     Temp 04/26/23 1031 97.7 F (36.5 C)     Temp src --      SpO2 04/26/23 1031 96 %     Weight --      Height --      Head Circumference --      Peak Flow --      Pain Score 04/26/23 1029 0     Pain Loc --      Pain Education --      Exclude from Growth Chart --    No data found.  Updated Vital Signs BP (!) 149/79 (BP Location: Left Arm)   Pulse 70   Temp 97.7 F (36.5 C)   Resp 17   SpO2 96%   Visual Acuity Right Eye Distance:   Left Eye Distance:   Bilateral Distance:    Right Eye Near:   Left Eye Near:    Bilateral Near:     Physical Exam Vitals reviewed.  Constitutional:      General: He is awake.     Appearance: Normal appearance. He is well-developed. He is not ill-appearing.     Comments: Very pleasant male appears stated age in no acute distress sitting comfortably in exam room  HENT:     Head: Normocephalic and atraumatic.     Right Ear: Tympanic membrane, ear canal and external ear normal. Tympanic membrane is not erythematous or bulging.     Left Ear: Tympanic membrane, ear canal and external ear normal. Tympanic membrane is not erythematous or bulging.  Nose: Nose normal.     Mouth/Throat:     Pharynx: Uvula midline. Posterior oropharyngeal erythema present. No oropharyngeal exudate.  Cardiovascular:     Rate and Rhythm: Normal rate and regular rhythm.     Heart sounds: Normal heart sounds, S1 normal and S2 normal. No murmur heard. Pulmonary:     Effort: Pulmonary effort is normal. No accessory muscle usage or respiratory distress.     Breath sounds: No stridor. Wheezing and rhonchi present. No rales.  Abdominal:     Palpations: Abdomen is soft.     Tenderness: There is no abdominal tenderness.  Neurological:     Mental Status: He is alert.  Psychiatric:        Behavior:  Behavior is cooperative.      UC Treatments / Results  Labs (all labs ordered are listed, but only abnormal results are displayed) Labs Reviewed - No data to display  EKG   Radiology DG Chest 2 View  Result Date: 04/26/2023 CLINICAL DATA:  Progressive cough and shortness of breath. History of COPD. EXAM: CHEST - 2 VIEW COMPARISON:  One-view chest x-ray 9/24/3 FINDINGS: The heart size is normal. Lungs are clear. Changes of COPD are noted. Focal airspace disease. Atherosclerotic calcifications are present in the aorta. Postoperative changes are present in the lower cervical spine and right shoulder. The visualized soft tissues and bony thorax are otherwise unremarkable. IMPRESSION: 1. No acute cardiopulmonary disease. 2. COPD. Electronically Signed   By: Marin Roberts M.D.   On: 04/26/2023 11:25    Procedures Procedures (including critical care time)  Medications Ordered in UC Medications  ipratropium-albuterol (DUONEB) 0.5-2.5 (3) MG/3ML nebulizer solution 3 mL (3 mLs Nebulization Given 04/26/23 1057)  methylPREDNISolone sodium succinate (SOLU-MEDROL) 125 mg/2 mL injection 60 mg (60 mg Intramuscular Given 04/26/23 1059)    Initial Impression / Assessment and Plan / UC Course  I have reviewed the triage vital signs and the nursing notes.  Pertinent labs & imaging results that were available during my care of the patient were reviewed by me and considered in my medical decision making (see chart for details).     Patient is well-appearing, afebrile, nontoxic, nontachycardic.  He had significant wheezing and rhonchi and was given a DuoNeb as well as Solu-Medrol in clinic.  He subjectively had a slight improvement of symptoms but continued to have significant wheezing on exam.  Chest x-ray was obtained that showed chronic changes consistent with COPD but no acute cardiopulmonary disease.  He has no concern for COVID so viral testing was deferred.  Will treat for COPD exacerbation.   He was encouraged to continue previously prescribed maintenance and rescue inhalers.  Will start prednisone taper tomorrow (04/27/2023) since he was given Solu-Medrol in clinic.  Initially prescribed 40 mg burst for 5 days but patient reports he generally requires a prolonged taper and so prednisone was switched to taper.  Will start doxycycline 100 mg twice daily for 10 days.  We discussed that he is to avoid prolonged sun exposure while on this medication.  Can use over-the-counter medications including Mucinex, Flonase, Tylenol.  Discussed if anything worsens or changes he needs to be seen immediately.  Strict return precautions given.  Final Clinical Impressions(s) / UC Diagnoses   Final diagnoses:  COPD exacerbation (HCC)  Shortness of breath     Discharge Instructions      Your x-ray showed COPD but no evidence of pneumonia.  We are going to treat for COPD exacerbation.  Take prednisone  starting tomorrow (04/27/2023) since we gave you the injection today.  Do not take NSAIDs with this medication including aspirin, ibuprofen/Advil, naproxen/Aleve.  You can use over-the-counter medication such as Mucinex, Flonase, Tylenol.  I also recommend a humidifier in your room.  Start doxycycline twice daily for 10 days.  Stay out of the sun while on this medication as it can cause you to have a bad sunburn.  Continue your inhalers as previously prescribed.  If your symptoms are not improving within 5 to 7 days or if anything worsens and you have worsening cough, shortness of breath, fever, weakness you need to be seen immediately.  Follow-up with your primary care.     ED Prescriptions     Medication Sig Dispense Auth. Provider   doxycycline (VIBRAMYCIN) 100 MG capsule Take 1 capsule (100 mg total) by mouth 2 (two) times daily. 20 capsule Hatice Bubel K, PA-C   predniSONE (DELTASONE) 20 MG tablet  (Status: Discontinued) Take 2 tablets (40 mg total) by mouth daily for 5 days. 10 tablet Shaunice Levitan K,  PA-C   predniSONE (STERAPRED UNI-PAK 21 TAB) 10 MG (21) TBPK tablet Take by mouth daily. Take 6 tabs by mouth daily  for 2 days, then 5 tabs for 2 days, then 4 tabs for 2 days, then 3 tabs for 2 days, 2 tabs for 2 days, then 1 tab by mouth daily for 2 days 42 tablet Shreya Lacasse K, PA-C      PDMP not reviewed this encounter.   Jeani Hawking, PA-C 04/26/23 1142

## 2023-04-26 NOTE — ED Triage Notes (Signed)
Pt c/o wheezing, cough that started Friday night. Pt has chronic COPD and emphysema.

## 2023-04-26 NOTE — Discharge Instructions (Addendum)
Your x-ray showed COPD but no evidence of pneumonia.  We are going to treat for COPD exacerbation.  Take prednisone starting tomorrow (04/27/2023) since we gave you the injection today.  Do not take NSAIDs with this medication including aspirin, ibuprofen/Advil, naproxen/Aleve.  You can use over-the-counter medication such as Mucinex, Flonase, Tylenol.  I also recommend a humidifier in your room.  Start doxycycline twice daily for 10 days.  Stay out of the sun while on this medication as it can cause you to have a bad sunburn.  Continue your inhalers as previously prescribed.  If your symptoms are not improving within 5 to 7 days or if anything worsens and you have worsening cough, shortness of breath, fever, weakness you need to be seen immediately.  Follow-up with your primary care.

## 2023-04-30 ENCOUNTER — Ambulatory Visit: Payer: PPO

## 2023-04-30 VITALS — Wt 172.0 lb

## 2023-04-30 DIAGNOSIS — Z0000 Encounter for general adult medical examination without abnormal findings: Secondary | ICD-10-CM | POA: Diagnosis not present

## 2023-04-30 NOTE — Patient Instructions (Signed)
George Watson , Thank you for taking time to come for your Medicare Wellness Visit. I appreciate your ongoing commitment to your health goals. Please review the following plan we discussed and let me know if I can assist you in the future.   Referrals/Orders/Follow-Ups/Clinician Recommendations: maintain health and activity   Aim for 30 minutes of exercise or brisk walking, 6-8 glasses of water, and 5 servings of fruits and vegetables each day.   This is a list of the screening recommended for you and due dates:  Health Maintenance  Topic Date Due   Flu Shot  05/22/2023   Medicare Annual Wellness Visit  04/29/2024   DTaP/Tdap/Td vaccine (3 - Td or Tdap) 01/02/2025   Pneumonia Vaccine  Completed   COVID-19 Vaccine  Completed   Hepatitis C Screening  Completed   Zoster (Shingles) Vaccine  Completed   HPV Vaccine  Aged Out   Colon Cancer Screening  Discontinued    Advanced directives: (Copy Requested) Please bring a copy of your health care power of attorney and living will to the office to be added to your chart at your convenience.  Next Medicare Annual Wellness Visit scheduled for next year: Yes

## 2023-04-30 NOTE — Progress Notes (Signed)
Subjective:   George Watson is a 77 y.o. male who presents for Medicare Annual/Subsequent preventive examination.  Visit Complete: Virtual I connected with  George Watson on 04/30/23 by a audio enabled telemedicine application and verified that I am speaking with the correct person using two identifiers.  Patient Location: Home  Provider Location: Office/Clinic  I discussed the limitations of evaluation and management by telemedicine. The patient expressed understanding and agreed to proceed.  Vital Signs: Because this visit was a virtual/telehealth visit, some criteria may be missing or patient reported. Any vitals not documented were not able to be obtained and vitals that have been documented are patient reported.  Patient Medicare AWV questionnaire was completed by the patient on 04/26/23; I have confirmed that all information answered by patient is correct and no changes since this date.  Because this visit was a virtual/telehealth visit, some criteria may be missing or patient reported. Any vitals not documented were not able to be obtained and vitals that have been documented are patient reported.    Cardiac Risk Factors include: advanced age (>43men, >3 women);dyslipidemia;hypertension;male gender     Objective:    Today's Vitals   04/30/23 1401  Weight: 172 lb (78 kg)   Body mass index is 25.04 kg/m.     04/30/2023    2:07 PM 11/14/2021    1:06 PM 11/15/2020    1:56 PM 10/10/2019    2:08 PM 07/07/2019    9:01 PM 07/07/2019    8:47 PM 07/07/2019    5:17 AM  Advanced Directives  Does Patient Have a Medical Advance Directive? Yes Yes Yes Yes Yes Yes No  Type of Estate agent of [place];Living will Healthcare Power of [place];Living will Living will Living will;Healthcare Power of Attorney Living will Living will   Does patient want to make changes to medical advance directive?    No - Patient declined No - Patient declined    Copy of  Healthcare Power of Attorney in Chart? No - copy requested   No - copy requested     Would patient like information on creating a medical advance directive?     No - Patient declined No - Patient declined No - Patient declined    Current Medications (verified) Outpatient Encounter Medications as of 04/30/2023  Medication Sig   albuterol (VENTOLIN HFA) 108 (90 Base) MCG/ACT inhaler Inhale 2 puffs into the lungs every 6 (six) hours as needed for wheezing or shortness of breath (AS NEEDED FOR SHORTNESS OF BREATH).   aspirin EC 81 MG EC tablet Take 1 tablet (81 mg total) by mouth daily.   clobetasol cream (TEMOVATE) 0.05 % 1 APPLICATION EXTERNALLY TWICE A DAY 30 DAYS   diazepam (VALIUM) 5 MG tablet Take 1 tablet (5 mg total) by mouth daily as needed. (Patient taking differently: Take 5 mg by mouth daily as needed. For MRI and Flying only per pt)   diphenoxylate-atropine (LOMOTIL) 2.5-0.025 MG tablet Take 1 tablet by mouth 4 (four) times daily as needed for diarrhea or loose stools. Please keep your upcoming appointment for further refills. Thank you   doxycycline (VIBRAMYCIN) 100 MG capsule Take 1 capsule (100 mg total) by mouth 2 (two) times daily.   dutasteride (AVODART) 0.5 MG capsule Take 0.5 mg by mouth daily.   fluticasone (FLONASE) 50 MCG/ACT nasal spray SPRAY 2 SPRAYS INTO EACH NOSTRIL EVERY DAY   Fluticasone-Umeclidin-Vilant (TRELEGY ELLIPTA) 100-62.5-25 MCG/ACT AEPB INHALE 1 PUFF BY MOUTH EVERY DAY  glucosamine-chondroitin 500-400 MG tablet Take 3 tablets by mouth daily. (Patient taking differently: Take 2 tablets by mouth daily.)   hydrochlorothiazide (MICROZIDE) 12.5 MG capsule Take 1 capsule (12.5 mg total) by mouth daily.   ibuprofen (ADVIL) 800 MG tablet Take 800 mg by mouth every 8 (eight) hours as needed.   losartan (COZAAR) 100 MG tablet TAKE 1 TABLET BY MOUTH EVERY DAY   methocarbamol (ROBAXIN) 500 MG tablet Take 1 tablet (500 mg total) by mouth every 8 (eight) hours as needed for  muscle spasms.   predniSONE (STERAPRED UNI-PAK 21 TAB) 10 MG (21) TBPK tablet Take by mouth daily. Take 6 tabs by mouth daily  for 2 days, then 5 tabs for 2 days, then 4 tabs for 2 days, then 3 tabs for 2 days, 2 tabs for 2 days, then 1 tab by mouth daily for 2 days   rosuvastatin (CRESTOR) 5 MG tablet TAKE 1 TABLET (5 MG TOTAL) BY MOUTH DAILY.   tamsulosin (FLOMAX) 0.4 MG CAPS capsule Take 0.4 mg by mouth daily.   No facility-administered encounter medications on file as of 04/30/2023.    Allergies (verified) Patient has no known allergies.   History: Past Medical History:  Diagnosis Date   Aortic aneurysm (HCC)    Arthritis    BPH (benign prostatic hypertrophy)    Cataract 2015   Cirrhosis (HCC)    COPD (chronic obstructive pulmonary disease) (HCC)    DDD (degenerative disc disease), lumbosacral    Dyspnea    Elevated PSA    Emphysema of lung (HCC)    Essential hypertension, benign    diet controlled   Fracture of shaft of clavicle    GERD (gastroesophageal reflux disease)    H/O drug abuse (HCC)    multisubstance   Hepatitis B    Hepatitis C 10/1996   History of hepatitis C 10/19/1996   Dr. [name] with [facility]. Treated in 2014. Followed yearly.      HSV-2 (herpes simplex virus 2) infection    Hx of biopsy 08/2004   liver   Hx of colonic polyps 2022   Hyperlipidemia    Inguinal hernia    right   Other and unspecified hyperlipidemia    Prostate cancer (HCC)    Substance abuse (HCC)    Tuberculosis    Past Surgical History:  Procedure Laterality Date   BACK SURGERY     CERVICAL SPINE SURGERY     EYE SURGERY  3/20   fracture ribs     3   HERNIA REPAIR     INGUINAL HERNIA REPAIR  07/01/2012   Procedure: HERNIA REPAIR INGUINAL ADULT;  Surgeon: Lodema Pilot, DO;  Location: [facility] ORS;  Service: General;  Laterality: Right;  with Mesh   LAPAROSCOPIC LIVER ULTRASOUND N/A 11/21/2021   Procedure: LAPAROSCOPIC LIVER ULTRASOUND;  Surgeon: Fritzi Mandes, MD;  Location:  [facility] OR;  Service: General;  Laterality: N/A;   LAPAROSCOPIC PARTIAL HEPATECTOMY Left 11/21/2021   Procedure: LAPAROSCOPIC PARTIAL HEPATECTOMY;  Surgeon: Fritzi Mandes, MD;  Location: [facility] OR;  Service: General;  Laterality: Left;   LAPAROSCOPY N/A 11/21/2021   Procedure: STAGING LAPAROSCOPY;  Surgeon: Fritzi Mandes, MD;  Location: [facility] OR;  Service: General;  Laterality: N/A;   left knee meniscus repair  07/21/1990   right rotator cuff  07/21/1990   right shoulder arthroscopy  07/21/2009   SPINE SURGERY  10/14   Family History  Problem Relation Age of Onset   Leukemia Mother  Throat cancer Father    Esophageal cancer Father    Cancer Father        esophageal cancer   Breast cancer Sister    Cancer Sister    Prostate cancer Brother    Melanoma Brother    Colon cancer Neg Hx    Stomach cancer Neg Hx    Pancreatic cancer Neg Hx    Liver disease Neg Hx    Social History   Socioeconomic History   Marital status: Married    Spouse name: Not on file   Number of children: 1   Years of education: Not on file   Highest education level: Some college, no degree  Occupational History   Occupation: Hospital doctor   Occupation: Retired     Associate Professor: Engineer, water    Comment: 20+ yrs   Tobacco Use   Smoking status: Former    Current packs/day: 0.00    Average packs/day: 1.5 packs/day for 43.0 years (64.5 ttl pk-yrs)    Types: Cigarettes    Start date: 02/22/1960    Quit date: 02/22/2003    Years since quitting: 20.1   Smokeless tobacco: Never  Vaping Use   Vaping status: Never Used  Substance and Sexual Activity   Alcohol use: Not Currently    Comment: 1988   Drug use: Not Currently    Types: Cocaine, Heroin    Comment: 26 years ago cocaine, heroin. methadone   Sexual activity: Not Currently    Comment: number of sex partners in the last 12 months  1  Other Topics Concern   Not on file  Social History Narrative      Married to [employer]. Exercise cardio daily for 30 minutes. Education:  [employer].   Social Determinants of Health   Financial Resource Strain: Low Risk  (04/26/2023)   Overall Financial Resource Strain (CARDIA)    Difficulty of Paying Living Expenses: Not hard at all  Food Insecurity: No Food Insecurity (04/26/2023)   Hunger Vital Sign    Worried About Running Out of Food in the Last Year: Never true    Ran Out of Food in the Last Year: Never true  Transportation Needs: No Transportation Needs (04/26/2023)   PRAPARE - Administrator, Civil Service (Medical): No    Lack of Transportation (Non-Medical): No  Physical Activity: Sufficiently Active (04/26/2023)   Exercise Vital Sign    Days of Exercise per Week: 7 days    Minutes of Exercise per Session: 60 min  Stress: No Stress Concern Present (04/26/2023)   [employer] of Occupational Health - Occupational Stress Questionnaire    Feeling of Stress : Not at all  Social Connections: Moderately Integrated (04/26/2023)   Social Connection and Isolation Panel [NHANES]    Frequency of Communication with Friends and Family: Twice a week    Frequency of Social Gatherings with Friends and Family: Three times a week    Attends Religious Services: Never    Active Member of Clubs or Organizations: Yes    Attends Banker Meetings: 1 to 4 times per year    Marital Status: Married    Tobacco Counseling Counseling given: Not Answered   Clinical Intake:  Pre-visit preparation completed: Yes  Pain : No/denies pain     BMI - recorded: 25.04 Nutritional Status: BMI 25 -29 Overweight Nutritional Risks: None Diabetes: No  How often do you need to have someone help you when you read instructions, pamphlets, or other written materials from  your doctor or pharmacy?: 1 - Never  Interpreter Needed?: No  Information entered by :: Lanier Ensign, LPN   Activities of Daily Living    04/26/2023    8:39 AM 11/24/2022    1:44 PM  In your present state of health, do you have any  difficulty performing the following activities:  Hearing? 0 0  Vision? 0 0  Difficulty concentrating or making decisions? 0 0  Walking or climbing stairs? 0 0  Dressing or bathing? 0 0  Doing errands, shopping? 0 0  Preparing Food and eating ? N   Using the Toilet? N   In the past six months, have you accidently leaked urine? N   Do you have problems with loss of bowel control? N   Managing your Medications? N   Managing your Finances? N   Housekeeping or managing your Housekeeping? N     Patient Care Team: Ardith Dark, MD as PCP - General (Family Medicine) Daleen Squibb, Jesse Sans, MD (Inactive) (Cardiology) Sharrell Ku, MD (Gastroenterology) Nadara Mustard, MD (Orthopedic Surgery) Ihor Gully, MD (Inactive) (Urology) Sinda Du, MD as Consulting Physician (Ophthalmology) Arminda Resides, MD as Consulting Physician (Dermatology) Malachy Mood, MD as Consulting Physician (Oncology)  Indicate any recent Medical Services you may have received from other than [facility] providers in the past year (date may be approximate).     Assessment:   This is a routine wellness examination for [facility].  Hearing/Vision screen Hearing Screening - Comments:: Pt denies any hearing issues  Vision Screening - Comments:: Pt follows up with [facility] for annual eye exams    Goals Addressed             This Visit's Progress    Patient Stated       Stay healthy and active        Depression Screen    04/30/2023    2:07 PM 11/24/2022    1:44 PM 08/11/2022    1:11 PM 07/28/2022    9:32 AM 05/02/2022   12:13 PM 04/10/2022    1:25 PM 02/06/2022    2:18 PM  PHQ 2/9 Scores  PHQ - 2 Score 0 0 0 0 0 0 0  PHQ- 9 Score   0 0 0 0 0  Exception Documentation  Medical reason Medical reason Medical reason  Medical reason Medical reason    Fall Risk    04/26/2023    8:39 AM 11/24/2022    1:44 PM 08/11/2022    1:11 PM 07/28/2022    9:32 AM 05/02/2022   12:13 PM  Fall Risk   Falls in the past  year? 0 0 0 0 0  Number falls in past yr: 0 0 0 0 0  Injury with Fall? 0 0 0 0 0  Risk for fall due to : Impaired vision No Fall Risks No Fall Risks No Fall Risks No Fall Risks  Follow up Falls prevention discussed Falls evaluation completed Falls evaluation completed Falls evaluation completed Falls evaluation completed    MEDICARE RISK AT HOME: Medicare Risk at Home Any stairs in or around the home?: No Home free of loose throw rugs in walkways, pet beds, electrical cords, etc?: No Adequate lighting in your home to reduce risk of falls?: Yes Life alert?: No Use of a cane, walker or w/c?: No Grab bars in the bathroom?: No Shower chair or bench in shower?: No Elevated toilet seat or a handicapped toilet?: No  TIMED UP AND GO:  Was the  test performed?  No    Cognitive Function:        04/30/2023    2:09 PM 05/02/2022   12:14 PM 11/15/2020    1:59 PM 10/10/2019    2:09 PM  6CIT Screen  What Year? 0 points 0 points 0 points 0 points  What month? 0 points  0 points 0 points  What time? 0 points 0 points  0 points  Count back from 20 0 points 0 points 0 points 0 points  Months in reverse 0 points 0 points 0 points 0 points  Repeat phrase 0 points 2 points 0 points 0 points  Total Score 0 points   0 points    Immunizations Immunization History  Administered Date(s) Administered   Fluad Quad(high Dose 65+) 03/29/2019, 05/21/2022   Hepatitis A 02/18/1998, 08/21/1998   Influenza Split 04/01/2011, 05/18/2012   Influenza, High Dose Seasonal PF 05/09/2015, 04/10/2016, 04/23/2016, 03/17/2018, 04/10/2021   Influenza, Seasonal, Injecte, Preservative Fre 05/01/2017   Influenza,inj,Quad PF,6+ Mos 04/28/2013, 04/21/2014   Influenza-Unspecified 04/21/2015, 05/21/2020   Moderna Covid-19 Fall Seasonal Vaccine 65yrs & older 05/21/2022   Moderna Covid-19 Vaccine Bivalent Booster 72yrs & up 05/21/2022   PFIZER(Purple Top)SARS-COV-2 Vaccination 08/10/2019, 08/31/2019, 05/21/2020, 11/19/2020    Pfizer Covid-19 Vaccine Bivalent Booster 15yrs & up 04/07/2021, 12/10/2021   Pneumococcal Conjugate-13 08/22/2014   Pneumococcal Polysaccharide-23 07/21/2002, 07/08/2016   Respiratory Syncytial Virus Vaccine,Recomb Aduvanted(Arexvy) 05/28/2022   Tdap 11/18/2005, 01/03/2015   Zoster Recombinant(Shingrix) 02/21/2022, 06/04/2022    TDAP status: Up to date  Flu Vaccine status: Up to date  Pneumococcal vaccine status: Up to date  Covid-19 vaccine status: Completed vaccines  Qualifies for Shingles Vaccine? Yes   Zostavax completed Yes   Shingrix Completed?: Yes  Screening Tests Health Maintenance  Topic Date Due   INFLUENZA VACCINE  05/22/2023   Medicare Annual Wellness (AWV)  04/29/2024   DTaP/Tdap/Td (3 - Td or Tdap) 01/02/2025   Pneumonia Vaccine 73+ Years old  Completed   COVID-19 Vaccine  Completed   Hepatitis C Screening  Completed   Zoster Vaccines- Shingrix  Completed   HPV VACCINES  Aged Out   Colonoscopy  Discontinued    Health Maintenance  There are no preventive care reminders to display for this patient.   Colorectal cancer screening: No longer required.    Additional Screening:  Hepatitis C Screening: Completed 03/02/23  Vision Screening: Recommended annual ophthalmology exams for early detection of glaucoma and other disorders of the eye. Is the patient up to date with their annual eye exam?  Yes  Who is the provider or what is the name of the office in which the patient attends annual eye exams? [facility]  If pt is not established with a provider, would they like to be referred to a provider to establish care? No .   Dental Screening: Recommended annual dental exams for proper oral hygiene   Community Resource Referral / Chronic Care Management: CRR required this visit?  No   CCM required this visit?  No     Plan:     I have personally reviewed and noted the following in the patient's chart:   Medical and social history Use  of alcohol, tobacco or illicit drugs  Current medications and supplements including opioid prescriptions. Patient is not currently taking opioid prescriptions. Functional ability and status Nutritional status Physical activity Advanced directives List of other physicians Hospitalizations, surgeries, and ER visits in previous 12 months Vitals Screenings to include cognitive, depression, and falls  Referrals and appointments  In addition, I have reviewed and discussed with patient certain preventive protocols, quality metrics, and best practice recommendations. A written personalized care plan for preventive services as well as general preventive health recommendations were provided to patient.     Marzella Schlein, LPN   [id number]/29/5621   After Visit Summary: (MyChart) Due to this being a telephonic visit, the after visit summary with patients personalized plan was offered to patient via MyChart   Nurse Notes: none

## 2023-05-29 ENCOUNTER — Encounter: Payer: Self-pay | Admitting: Family Medicine

## 2023-05-29 NOTE — Telephone Encounter (Signed)
Ok to order labs prior to visit.  Katina Degree. Jimmey Ralph, MD 05/29/2023 12:02 PM

## 2023-06-01 ENCOUNTER — Other Ambulatory Visit: Payer: Self-pay

## 2023-06-01 DIAGNOSIS — I10 Essential (primary) hypertension: Secondary | ICD-10-CM

## 2023-06-02 ENCOUNTER — Ambulatory Visit (INDEPENDENT_AMBULATORY_CARE_PROVIDER_SITE_OTHER): Payer: PPO | Admitting: Family Medicine

## 2023-06-02 ENCOUNTER — Other Ambulatory Visit (INDEPENDENT_AMBULATORY_CARE_PROVIDER_SITE_OTHER): Payer: PPO

## 2023-06-02 VITALS — BP 128/70 | HR 78 | Temp 98.4°F | Ht 69.5 in | Wt 173.4 lb

## 2023-06-02 DIAGNOSIS — M51379 Other intervertebral disc degeneration, lumbosacral region without mention of lumbar back pain or lower extremity pain: Secondary | ICD-10-CM | POA: Diagnosis not present

## 2023-06-02 DIAGNOSIS — I10 Essential (primary) hypertension: Secondary | ICD-10-CM

## 2023-06-02 DIAGNOSIS — M47816 Spondylosis without myelopathy or radiculopathy, lumbar region: Secondary | ICD-10-CM

## 2023-06-02 DIAGNOSIS — J449 Chronic obstructive pulmonary disease, unspecified: Secondary | ICD-10-CM | POA: Diagnosis not present

## 2023-06-02 DIAGNOSIS — R7303 Prediabetes: Secondary | ICD-10-CM | POA: Diagnosis not present

## 2023-06-02 DIAGNOSIS — E785 Hyperlipidemia, unspecified: Secondary | ICD-10-CM

## 2023-06-02 LAB — CBC WITH DIFFERENTIAL/PLATELET
Basophils Absolute: 0 10*3/uL (ref 0.0–0.1)
Basophils Relative: 0.6 % (ref 0.0–3.0)
Eosinophils Absolute: 0.3 10*3/uL (ref 0.0–0.7)
Eosinophils Relative: 4.1 % (ref 0.0–5.0)
HCT: 44.8 % (ref 39.0–52.0)
Hemoglobin: 15.3 g/dL (ref 13.0–17.0)
Lymphocytes Relative: 26.9 % (ref 12.0–46.0)
Lymphs Abs: 1.9 10*3/uL (ref 0.7–4.0)
MCHC: 34.1 g/dL (ref 30.0–36.0)
MCV: 93.8 fL (ref 78.0–100.0)
Monocytes Absolute: 1.2 10*3/uL — ABNORMAL HIGH (ref 0.1–1.0)
Monocytes Relative: 17.7 % — ABNORMAL HIGH (ref 3.0–12.0)
Neutro Abs: 3.5 10*3/uL (ref 1.4–7.7)
Neutrophils Relative %: 50.7 % (ref 43.0–77.0)
Platelets: 209 10*3/uL (ref 150.0–400.0)
RBC: 4.77 Mil/uL (ref 4.22–5.81)
RDW: 12.9 % (ref 11.5–15.5)
WBC: 7 10*3/uL (ref 4.0–10.5)

## 2023-06-02 LAB — POCT GLYCOSYLATED HEMOGLOBIN (HGB A1C): Hemoglobin A1C: 6.5 % — AB (ref 4.0–5.6)

## 2023-06-02 LAB — COMPREHENSIVE METABOLIC PANEL
ALT: 32 U/L (ref 0–53)
AST: 24 U/L (ref 0–37)
Albumin: 4.2 g/dL (ref 3.5–5.2)
Alkaline Phosphatase: 53 U/L (ref 39–117)
BUN: 20 mg/dL (ref 6–23)
CO2: 27 meq/L (ref 19–32)
Calcium: 10 mg/dL (ref 8.4–10.5)
Chloride: 103 meq/L (ref 96–112)
Creatinine, Ser: 0.97 mg/dL (ref 0.40–1.50)
GFR: 76.03 mL/min (ref >60.00)
Glucose, Bld: 129 mg/dL — ABNORMAL HIGH (ref 70–99)
Potassium: 4.1 meq/L (ref 3.5–5.1)
Sodium: 138 meq/L (ref 135–145)
Total Bilirubin: 0.6 mg/dL (ref 0.2–1.2)
Total Protein: 6.9 g/dL (ref 6.0–8.3)

## 2023-06-02 LAB — LIPID PANEL
Cholesterol: 134 mg/dL (ref 0–200)
HDL: 39.1 mg/dL (ref >39.00)
LDL Cholesterol: 76 mg/dL (ref 0–99)
NonHDL: 94.69
Total CHOL/HDL Ratio: 3
Triglycerides: 95 mg/dL (ref 0.0–149.0)
VLDL: 19 mg/dL (ref 0.0–40.0)

## 2023-06-02 LAB — TSH: TSH: 2.59 u[IU]/mL (ref 0.35–5.50)

## 2023-06-02 MED ORDER — METHOCARBAMOL 500 MG PO TABS: 500.0000 mg | ORAL_TABLET | Freq: Three times a day (TID) | ORAL | 0 refills | Status: DC | PRN

## 2023-06-02 NOTE — Assessment & Plan Note (Signed)
A1c improved to 6.5.  He will continue to work on lifestyle modifications.  Recheck in 3 months.

## 2023-06-02 NOTE — Patient Instructions (Signed)
It was very nice to see you today!  Your A1c is improving.  Please continue to work on diet and exercise.  I will see back in 3 months. Come back sooner if needed.   Return in about 3 months (around 09/02/2023) for Follow Up.   Take care, Dr Jimmey Ralph  PLEASE NOTE:  If you had any lab tests, please let us know if you have not heard back within a few days. You may see your results on mychart before we have a chance to review them but we will give you a call once they are reviewed by [place].   If we ordered any referrals today, please let us know if you have not heard from their office within the next week.   If you had any urgent prescriptions sent in today, please check with the pharmacy within an hour of our visit to make sure the prescription was transmitted appropriately.   Please try these tips to maintain a healthy lifestyle:  Eat at least 3 REAL meals and 1-2 snacks per day.  Aim for no more than 5 hours between eating.  If you eat breakfast, please do so within one hour of getting up.   Each meal should contain half fruits/vegetables, one quarter protein, and one quarter carbs (no bigger than a computer mouse)  Cut down on sweet beverages. This includes juice, soda, and sweet tea.   Drink at least 1 glass of water with each meal and aim for at least 8 glasses per day  Exercise at least 150 minutes every week.

## 2023-06-02 NOTE — Progress Notes (Signed)
   George Watson is a 77 y.o. male who presents today for an office visit.  Assessment/Plan:  Chronic Problems Addressed Today: Prediabetes A1c improved to 6.5.  He will continue to work on lifestyle modifications.  Recheck in 3 months.  Essential hypertension Blood pressure at goal today on HCTZ 12.5 mg daily and losartan 100 mg daily.  Dyslipidemia Lipids at goal.  Continue Crestor 5 mg daily.  COPD (chronic obstructive pulmonary disease) Mild flare recently but improved.  Does have faint rhonchi and mild wheezing on exam today but feels at baseline.  Continue current regimen Trelegy once daily and albuterol as needed.  Lumbar spondylosis Stable.  Refilled Robaxin.     Subjective:  HPI:  See A/P for status of chronic conditions.  Patient is here today for follow-up.  We last saw him a few months ago.  At that time A1c was 6.7.  He did have a flare from COPD a month ago.  Was treated with course of antibiotics and steroids.  Symptoms have resolved.       Objective:  Physical Exam: BP 128/70   Pulse 78   Temp 98.4 F (36.9 C) (Temporal)   Ht 5' 9.5" (1.765 m)   Wt 173 lb 6.4 oz (78.7 kg)   SpO2 95%   BMI 25.24 kg/m   Gen: No acute distress, resting comfortably CV: Regular rate and rhythm with no murmurs appreciated Pulm: Normal work of breathing, occasional expiratory wheeze otherwise clear to auscultation bilaterally. Neuro: Grossly normal, moves all extremities Psych: Normal affect and thought content      Shannelle Alguire M. Jimmey Ralph, MD 06/02/2023 2:25 PM

## 2023-06-02 NOTE — Assessment & Plan Note (Signed)
Stable.  Refilled Robaxin.

## 2023-06-02 NOTE — Assessment & Plan Note (Signed)
Lipids at goal.  Continue Crestor 5 mg daily.

## 2023-06-02 NOTE — Assessment & Plan Note (Signed)
Blood pressure at goal today on HCTZ 12.5 mg daily and losartan 100 mg daily.

## 2023-06-02 NOTE — Assessment & Plan Note (Signed)
Mild flare recently but improved.  Does have faint rhonchi and mild wheezing on exam today but feels at baseline.  Continue current regimen Trelegy once daily and albuterol as needed.

## 2023-06-03 ENCOUNTER — Other Ambulatory Visit: Payer: Self-pay | Admitting: Gastroenterology

## 2023-06-04 ENCOUNTER — Encounter: Payer: Self-pay | Admitting: Family Medicine

## 2023-06-10 ENCOUNTER — Encounter: Payer: Self-pay | Admitting: Gastroenterology

## 2023-06-10 ENCOUNTER — Ambulatory Visit: Payer: PPO | Admitting: Gastroenterology

## 2023-06-10 VITALS — BP 122/62 | HR 73 | Ht 70.0 in | Wt 175.0 lb

## 2023-06-10 DIAGNOSIS — K7460 Unspecified cirrhosis of liver: Secondary | ICD-10-CM | POA: Diagnosis not present

## 2023-06-10 DIAGNOSIS — F1091 Alcohol use, unspecified, in remission: Secondary | ICD-10-CM

## 2023-06-10 DIAGNOSIS — K580 Irritable bowel syndrome with diarrhea: Secondary | ICD-10-CM

## 2023-06-10 DIAGNOSIS — C220 Liver cell carcinoma: Secondary | ICD-10-CM

## 2023-06-10 MED ORDER — DIPHENOXYLATE-ATROPINE 2.5-0.025 MG PO TABS: 1.0000 | ORAL_TABLET | Freq: Two times a day (BID) | ORAL | 2 refills | Status: DC

## 2023-06-10 NOTE — Progress Notes (Signed)
HPI :  77 year old male here for follow-up visit.  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 Last saw him in February:   Liver dz history: History of hepatitis C since 1990s, treated with multiple rounds of interferon and ribavirin which he failed.  He eventually had treatment at [facility] in 2014 for hep C with a different regimen and was eradicated.  Remote alcohol use but sober for > 30 years.  There has been suggestions on remote ultrasounds that he had underlying cirrhosis however ultrasounds in more recent years have not shown any evidence of that.  He has not had any decompensations of his liver. Unfortunately found to have [facility] In March 2023 on a screening US. He was referred to oncology and underwent a liver biopsy which confirmed HCC.  He was then referred to surgery, Dr. Freida Busman, had resection of the mass with clear margins on Nov 21 2021.    SINCE LAST VISIT  He has been followed with serial imaging since the resection.  Last MRI of his liver was performed on August 5.  They see segment 3 resection without recurrent or metastatic disease.  There are some arterial phase hyperenhancing observations throughout the liver considered LI-RADS 3 lesions, and recommended to have another MRI in 6 months.  This particular MRI suggest cirrhosis of the liver.  Again his imaging in recent years has suggested cirrhosis on some scans, and on others has not.  His spleen is normal, his platelet count is normal, his liver enzymes are normal.  He had no varices on EGD in 2022.  Surgical specimen from hepatic resection did not show cirrhosis.  He is feeling well at baseline.  He has not had any decompensating events in regards to his liver disease.  He has been abstinent from alcohol he states for over 37 years at this point.  He has not smoked tobacco in several years.  He does have an occasional flare of COPD for  which he takes steroids but that is not common.  Otherwise he carries a diagnosis of suspected postinfectious IBS-D.  His colonoscopy is up-to-date.  He has had an extensive workup for this in the past.  He previously found that Imodium constipated him quite a bit and he did not like how he felt on it, he surprisingly has tolerated Lomotil much better.  He takes Lomotil on average 2 times daily, he is really happy with the regimen and that works pretty well for him.  He has had some issues getting refills on this from his pharmacy however.  He does have a history of diastases recti, has some positional pressure he feels in his abdomen that can make it hard for him to breathe however only when he is bending over.  He denies much increased gas or bloating recently, that issue is no longer bothering him.     Prior evaluation: Colonoscopy 09/11/20 - 7mm rectal polyp, divertculosis, small hemorrhoids, normal ileum - biopsies taken to rule out microscopic colitis  - polyp sessile serrated, normal colon otherwise - repeat colon in 5 years    EGD 12/05/20: - The exam of the esophagus was otherwise normal. No varices. - The entire examined stomach was normal. No varices. - A single small angiodysplastic lesion was found in the second portion of the duodenum. - The duodenal bulb and second portion of the duodenum were otherwise normal.  C diff positive 01/10/21: Treated with vancomycin    Liver biopsy 10/24/21: FINAL MICROSCOPIC DIAGNOSIS:   A. LIVER, LEFT LOBE, BIOPSY:  - Moderately differentiated hepatocellular carcinoma, see comment   Had liver resection with Dr. Freida Busman 5/4, discharged on 5/5 Preoperative Diagnosis: Hepatocellular carcinoma Postoperative Diagnosis: Same   Procedure: Staging laparoscopy, laparoscopic partial left hepatectomy (segment 3), intraoperative liver ultrasound   FINAL MICROSCOPIC DIAGNOSIS:   A.   LIVER, LEFT LATERAL, PARTIAL HEPATECTOMY:  -    Hepatocellular  carcinoma, grade 2 (moderately differentiated), 2.8  cm in greatest dimension, margin negative for invasive carcinoma.  -    Non-cirrhotic liver with mild macrovesicular steatosis.      MRI liver 08/18/22: IMPRESSION: 1. Postsurgical changes from segment 3 wedge resection without recurrent/residual disease. 2. Unchanged multifocal hepatic subcentimeter arterially enhancing foci without washout or capsule appearance. No definite new focal lesions. LI-RADS 3. Recommend continued attention on follow-up.   MRI liver with contrast 02/23/23: IMPRESSION: 1. Cirrhosis and segment 3 hepatic resection without recurrent or metastatic disease. 2. Numerous arterial phase hyperenhancing observations throughout the liver again identified and are considered LI-RADS 3, intermediate probability. These can be re-evaluated on MRI follow-up at 6 months. 3.  Aortic Atherosclerosis (ICD10-I70.0).    Past Medical History:  Diagnosis Date   Aortic aneurysm (HCC)    Arthritis    BPH (benign prostatic hypertrophy)    Cataract 2015   Cirrhosis (HCC)    COPD (chronic obstructive pulmonary disease) (HCC)    DDD (degenerative disc disease), lumbosacral    Dyspnea    Elevated PSA    Emphysema of lung (HCC)    Essential hypertension, benign    diet controlled   Fracture of shaft of clavicle    GERD (gastroesophageal reflux disease)    H/O drug abuse (HCC)    multisubstance   Hepatitis B    Hepatitis C 10/1996   History of hepatitis C 10/19/1996   Dr. [name] with [facility]. Treated in 2014. Followed yearly.      HSV-2 (herpes simplex virus 2) infection    Hx of biopsy 08/2004   liver   Hx of colonic polyps 2022   Hyperlipidemia    Inguinal hernia    right   Other and unspecified hyperlipidemia    Prostate cancer (HCC)    Substance abuse (HCC)    Tuberculosis      Past Surgical History:  Procedure Laterality Date   BACK SURGERY     CERVICAL SPINE SURGERY     EYE SURGERY  3/20    fracture ribs     3   HERNIA REPAIR     INGUINAL HERNIA REPAIR  07/01/2012   Procedure: HERNIA REPAIR INGUINAL ADULT;  Surgeon: Lodema Pilot, DO;  Location: [facility] ORS;  Service: General;  Laterality: Right;  with Mesh   LAPAROSCOPIC LIVER ULTRASOUND N/A 11/21/2021   Procedure: LAPAROSCOPIC LIVER ULTRASOUND;  Surgeon: Fritzi Mandes, MD;  Location: [facility] OR;  Service: General;  Laterality: N/A;   LAPAROSCOPIC PARTIAL HEPATECTOMY Left 11/21/2021   Procedure: LAPAROSCOPIC PARTIAL HEPATECTOMY;  Surgeon: Fritzi Mandes, MD;  Location: [facility] OR;  Service: General;  Laterality: Left;   LAPAROSCOPY N/A 11/21/2021   Procedure: STAGING LAPAROSCOPY;  Surgeon: Fritzi Mandes, MD;  Location: [facility] OR;  Service: General;  Laterality: N/A;   left knee meniscus repair  07/21/1990   right rotator cuff  07/21/1990   right shoulder arthroscopy  07/21/2009   SPINE SURGERY  10/14   Family History  Problem Relation Age of Onset   Leukemia Mother    Throat cancer Father    Esophageal cancer Father    Cancer Father        esophageal cancer   Breast cancer Sister    Cancer Sister    Prostate cancer Brother    Melanoma Brother    Colon cancer Neg Hx    Stomach cancer Neg Hx    Pancreatic cancer Neg Hx    Liver disease Neg Hx    Social History   Tobacco Use   Smoking status: Former    Current packs/day: 0.00    Average packs/day: 1.5 packs/day for 43.0 years (64.5 ttl pk-yrs)    Types: Cigarettes    Start date: 02/22/1960    Quit date: 02/22/2003    Years since quitting: 20.3   Smokeless tobacco: Never  Vaping Use   Vaping status: Never Used  Substance Use Topics   Alcohol use: Not Currently    Comment: 1988   Drug use: Not Currently    Types: Cocaine, Heroin    Comment: 26 years ago cocaine, heroin. methadone   Current Outpatient Medications  Medication Sig Dispense Refill   albuterol (VENTOLIN HFA) 108 (90 Base) MCG/ACT inhaler Inhale 2 puffs into the lungs every 6 (six) hours as needed for wheezing or  shortness of breath (AS NEEDED FOR SHORTNESS OF BREATH). 18 each 5   aspirin EC 81 MG EC tablet Take 1 tablet (81 mg total) by mouth daily.     clobetasol cream (TEMOVATE) 0.05 % 1 APPLICATION EXTERNALLY TWICE A DAY 30 DAYS     diazepam (VALIUM) 5 MG tablet Take 1 tablet (5 mg total) by mouth daily as needed. (Patient taking differently: Take 5 mg by mouth daily as needed. For MRI and Flying only per pt) 30 tablet 0   diphenoxylate-atropine (LOMOTIL) 2.5-0.025 MG tablet Take 1 tablet by mouth 4 (four) times daily as needed for diarrhea or loose stools. Please keep your upcoming appointment for further refills. Thank you 90 tablet 1   dutasteride (AVODART) 0.5 MG capsule Take 0.5 mg by mouth daily.     fluticasone (FLONASE) 50 MCG/ACT nasal spray SPRAY 2 SPRAYS INTO EACH NOSTRIL EVERY DAY     Fluticasone-Umeclidin-Vilant (TRELEGY ELLIPTA) 100-62.5-25 MCG/ACT AEPB INHALE 1 PUFF BY MOUTH EVERY DAY 60 each 5   glucosamine-chondroitin 500-400 MG tablet Take 3 tablets by mouth daily. (Patient taking differently: Take 2 tablets by mouth daily.)     hydrochlorothiazide (MICROZIDE) 12.5 MG capsule Take 1 capsule (12.5 mg total) by mouth daily. 90 capsule 3   ibuprofen (ADVIL) 800 MG tablet Take 800 mg by mouth every 8 (eight) hours as needed.     losartan (COZAAR) 100 MG tablet TAKE 1 TABLET BY MOUTH EVERY DAY 90 tablet 1   methocarbamol (ROBAXIN) 500 MG tablet Take 1 tablet (500 mg total) by mouth every 8 (eight) hours as needed for muscle spasms. 30 tablet 0   rosuvastatin (CRESTOR) 5 MG tablet TAKE 1 TABLET (5 MG TOTAL) BY MOUTH DAILY. 90 tablet 1   tamsulosin (FLOMAX) 0.4 MG CAPS capsule Take 0.4 mg by mouth daily.     No current facility-administered medications for this visit.   No Known Allergies   Review of Systems: All systems reviewed and negative except where noted in HPI.   Lab Results  Component Value Date   WBC 7.0 06/02/2023   HGB 15.3 06/02/2023   HCT 44.8 06/02/2023   MCV 93.8  06/02/2023   PLT 209.0 06/02/2023    Lab Results  Component Value Date   NA 138 06/02/2023   CL 103 06/02/2023   K 4.1 06/02/2023   CO2 27 06/02/2023   BUN 20 06/02/2023   CREATININE 0.97 06/02/2023   GFR 76.03 06/02/2023   CALCIUM 10.0 06/02/2023   ALBUMIN 4.2 06/02/2023   GLUCOSE 129 (H) 06/02/2023    Lab Results  Component Value Date   ALT 32 06/02/2023   AST 24 06/02/2023   ALKPHOS 53 06/02/2023   BILITOT 0.6 06/02/2023   Lab Results  Component Value Date   INR 1.0 06/24/2022   INR 1.0 10/24/2021   INR 1.0 09/23/2021     Physical Exam: BP 122/62   Pulse 73   Ht 5\' 10"  (1.778 m)   Wt 175 lb (79.4 kg)   BMI 25.11 kg/m  Constitutional: Pleasant,well-developed, male in no acute distress. Neurological: Alert and oriented to person place and time. Psychiatric: Normal mood and affect. Behavior is normal.   ASSESSMENT: 77 y.o. male here for assessment of the following  1. Hepatocellular carcinoma (HCC)   2. Cirrhosis of liver without ascites, unspecified hepatic cirrhosis type (HCC)   3. Irritable bowel syndrome with diarrhea    He has been stable following his operation for [facility].  He has some indeterminate LI-RADS 3 lesions in his liver being followed by MRI and the surgical team.  He is due for another MRI in 6 months.  We had a good discussion about his liver disease in general and if he has cirrhosis or not.  He has had conflicting findings on prior imaging studies, some suggesting cirrhosis, some not.  He had no cirrhosis on his surgical pathology results from his hepatic resection.  We discussed that if he does have cirrhosis, it is compensated and there is no significant portal hypertension.  His labs are up-to-date and look okay.  We discussed if he needed another EGD for screening for varices at some point next year.  I do not feel strongly that he needs it given the information we have available but he will think about this.  Otherwise reviewed his  history of IBS-D and regimens to date.  He prefers to take Lomotil as this reliably works for him, he tolerates it well, and he is happy with the regimen.  Interestingly, Imodium was too constipating for him and he did not like how he felt on it.  Gas and bloating not really bothering him too much anymore.  Will continue present regimen for now.  I will see him again in 6 months for reassessment.   PLAN: - continued MRI surveillance of liver, next exam 6 months from his last. He will continue to see Dr. Freida Busman - unclear if he has cirrhosis or not but if he does he is compensated, labs UTD - consider EGD in May, don't feel strongly he needs that if his platelets otherwise remain normal - continue lomotil - refill, prefers to continue this per discussion above - f/u 6 months or sooner with issues  Harlin Rain, MD [facility]

## 2023-06-10 NOTE — Patient Instructions (Signed)
We have sent the following medications to your pharmacy for you to pick up at your convenience: Lomotil: Take 2 tablets daily  Please follow up in 6 months.  Thank you for entrusting me with your care and for choosing [facility], Dr. Ileene Patrick    _______________________________________________________  If your blood pressure at your visit was 140/90 or greater, please contact your primary care physician to follow up on this.  _______________________________________________________  If you are age 67 or older, your body mass index should be between 23-30. Your Body mass index is 25.11 kg/m. If this is out of the aforementioned range listed, please consider follow up with your Primary Care Provider.  If you are age 110 or younger, your body mass index should be between 19-25. Your Body mass index is 25.11 kg/m. If this is out of the aformentioned range listed, please consider follow up with your Primary Care Provider.   ________________________________________________________  The [facility] providers would like to encourage you to use [facility] to communicate with providers for non-urgent requests or questions.  Due to long hold times on the telephone, sending your provider a message by [facility] may be a faster and more efficient way to get a response.  Please allow 48 business hours for a response.  Please remember that this is for non-urgent requests.  _______________________________________________________

## 2023-06-11 ENCOUNTER — Encounter (HOSPITAL_BASED_OUTPATIENT_CLINIC_OR_DEPARTMENT_OTHER): Payer: Self-pay | Admitting: Family Medicine

## 2023-07-02 ENCOUNTER — Other Ambulatory Visit: Payer: Self-pay | Admitting: Adult Health

## 2023-07-09 ENCOUNTER — Telehealth: Payer: PPO | Admitting: Family Medicine

## 2023-07-09 ENCOUNTER — Other Ambulatory Visit: Payer: Self-pay | Admitting: Family Medicine

## 2023-07-09 ENCOUNTER — Encounter: Payer: Self-pay | Admitting: Family Medicine

## 2023-07-09 VITALS — Ht 70.0 in | Wt 170.0 lb

## 2023-07-09 DIAGNOSIS — N400 Enlarged prostate without lower urinary tract symptoms: Secondary | ICD-10-CM

## 2023-07-09 MED ORDER — MIRABEGRON ER 50 MG PO TB24: 50.0000 mg | ORAL_TABLET | Freq: Every day | ORAL | 5 refills | Status: DC

## 2023-07-09 NOTE — Assessment & Plan Note (Signed)
Following with urology for this and prostate cancer.  He is currently on dutasteride 0.5 mg daily and tamsulosin 0.4 mg daily.  He is having frequent nocturia that has been managed with Benadryl however he is concerned with long-term side effects of Benadryl including cognitive decline.  We did discuss alternative treatment options.  Will try Myrbetriq.  Hopefully this will have less CNS side effects but also help control his frequent urination.  He can discuss with with urology as well but he will follow-up with [place] in a few weeks to let us know how this is working.  If does not have great control of Myrbetriq would consider trial of alternative antispasmodic agent.  Regarding his concern for side effects, we did discuss that inadequate sleep is probably a bigger risk for cognitive decline than Benadryl use and our priority should be focused on getting adequate sleep at this point.

## 2023-07-09 NOTE — Progress Notes (Signed)
   George Watson is a 77 y.o. male who presents today for a virtual office visit.  Assessment/Plan:  Chronic Problems Addressed Today: Benign prostatic hyperplasia Following with urology for this and prostate cancer.  He is currently on dutasteride 0.5 mg daily and tamsulosin 0.4 mg daily.  He is having frequent nocturia that has been managed with Benadryl however he is concerned with long-term side effects of Benadryl including cognitive decline.  We did discuss alternative treatment options.  Will try Myrbetriq.  Hopefully this will have less CNS side effects but also help control his frequent urination.  He can discuss with with urology as well but he will follow-up with [place] in a few weeks to let us know how this is working.  If does not have great control of Myrbetriq would consider trial of alternative antispasmodic agent.  Regarding his concern for side effects, we did discuss that inadequate sleep is probably a bigger risk for cognitive decline than Benadryl use and our priority should be focused on getting adequate sleep at this point.     Subjective:  HPI:  See Assessment / plan for status of chronic conditions.  Patient here to discuss poor sleep secondary to frequent nocturia related to his history of BPH and prostate cancer.  This has been going on for many years.  He has previously discussed this with urology.  They have recommended he try Tylenol PM with Benadryl.  This improved his episodes of nocturia from once every hour to only 3-4 times per night.  He has done well this however is concerned with potential side effects of the Benadryl and is interested in discussing alternatives.  He is able to usually quickly go back to sleep after waking up.       Objective/Observations  Physical Exam: Gen: NAD, resting comfortably Pulm: Normal work of breathing Neuro: Grossly normal, moves all extremities Psych: Normal affect and thought content  Virtual Visit via Video   I  connected with George Watson on 07/09/23 at  7:20 AM EST by a video enabled telemedicine application and verified that I am speaking with the correct person using two identifiers. The limitations of evaluation and management by telemedicine and the availability of in person appointments were discussed. The patient expressed understanding and agreed to proceed.   Patient location: Home Provider location: [place] [employer] Persons participating in the virtual visit: Myself and Patient     Katina Degree. Jimmey Ralph, MD 07/09/2023 7:45 AM

## 2023-07-13 ENCOUNTER — Ambulatory Visit (HOSPITAL_COMMUNITY)
Admission: RE | Admit: 2023-07-13 | Discharge: 2023-07-13 | Disposition: A | Discharge status: Home / Self Care | Payer: PPO | Source: Ambulatory Visit | Attending: Family Medicine | Admitting: Family Medicine

## 2023-07-13 ENCOUNTER — Other Ambulatory Visit: Payer: Self-pay

## 2023-07-13 ENCOUNTER — Encounter (HOSPITAL_COMMUNITY): Payer: Self-pay

## 2023-07-13 VITALS — BP 146/78 | HR 73 | Temp 97.6°F | Resp 20

## 2023-07-13 DIAGNOSIS — R062 Wheezing: Secondary | ICD-10-CM

## 2023-07-13 MED ORDER — DOXYCYCLINE HYCLATE 100 MG PO CAPS: 100.0000 mg | ORAL_CAPSULE | Freq: Two times a day (BID) | ORAL | 0 refills | Status: DC

## 2023-07-13 MED ORDER — PREDNISONE 10 MG (21) PO TBPK: ORAL_TABLET | Freq: Every day | ORAL | 0 refills | Status: DC

## 2023-07-13 NOTE — Discharge Instructions (Addendum)
Please follow instructions on your steroid package. Please only take your antibiotic if you feel like you are not getting better with steroid alone.

## 2023-07-13 NOTE — ED Provider Notes (Signed)
[facility]    CSN: [id number] Arrival date & time: 07/13/23  3235      History   Chief Complaint Chief Complaint  Patient presents with   Wheezing   Appointment    10:00    HPI George Watson is a 77 y.o. male.   Patient presenting with some cough as well as some wheezing and shortness of breath.  Patient has a history of emphysema and has regular flareups.  Patient denies any fevers or chills but does note some chest tightness.  Patient otherwise has no other concerns at this time.  Patient had a last episode like this approximately 2-1/2 months ago.   Wheezing   Past Medical History:  Diagnosis Date   Aortic aneurysm (HCC)    Arthritis    BPH (benign prostatic hypertrophy)    Cataract 2015   Cirrhosis (HCC)    COPD (chronic obstructive pulmonary disease) (HCC)    DDD (degenerative disc disease), lumbosacral    Dyspnea    Elevated PSA    Emphysema of lung (HCC)    Essential hypertension, benign    diet controlled   Fracture of shaft of clavicle    GERD (gastroesophageal reflux disease)    H/O drug abuse (HCC)    multisubstance   Hepatitis B    Hepatitis C 10/1996   History of hepatitis C 10/19/1996   Dr. [name] with [facility]. Treated in 2014. Followed yearly.      HSV-2 (herpes simplex virus 2) infection    Hx of biopsy 08/2004   liver   Hx of colonic polyps 2022   Hyperlipidemia    Inguinal hernia    right   Other and unspecified hyperlipidemia    Prostate cancer (HCC)    Substance abuse (HCC)    Tuberculosis     Patient Active Problem List   Diagnosis Date Noted   IBS (irritable bowel syndrome) 03/02/2023   Specific phobia 03/02/2023   Osteoarthritis 03/02/2023   Essential hypertension 11/24/2022   Hepatocellular carcinoma (HCC) s/p resection 2023 10/14/2021   Lung nodule 10/05/2020   Dilated aortic root (HCC) 06/13/2019   Prediabetes 01/06/2017   Atherosclerosis of aorta (HCC) 07/09/2016   Dyslipidemia    HSV-2 (herpes  simplex virus 2) infection    COPD (chronic obstructive pulmonary disease) (HCC)    Benign prostatic hyperplasia    Lumbar spondylosis    Prostate cancer (HCC)    Cirrhosis (HCC) 05/21/1997    Past Surgical History:  Procedure Laterality Date   BACK SURGERY     CERVICAL SPINE SURGERY     EYE SURGERY  3/20   fracture ribs     3   HERNIA REPAIR     INGUINAL HERNIA REPAIR  07/01/2012   Procedure: HERNIA REPAIR INGUINAL ADULT;  Surgeon: Lodema Pilot, DO;  Location: [facility] ORS;  Service: General;  Laterality: Right;  with Mesh   LAPAROSCOPIC LIVER ULTRASOUND N/A 11/21/2021   Procedure: LAPAROSCOPIC LIVER ULTRASOUND;  Surgeon: Fritzi Mandes, MD;  Location: [facility] OR;  Service: General;  Laterality: N/A;   LAPAROSCOPIC PARTIAL HEPATECTOMY Left 11/21/2021   Procedure: LAPAROSCOPIC PARTIAL HEPATECTOMY;  Surgeon: Fritzi Mandes, MD;  Location: [facility] OR;  Service: General;  Laterality: Left;   LAPAROSCOPY N/A 11/21/2021   Procedure: STAGING LAPAROSCOPY;  Surgeon: Fritzi Mandes, MD;  Location: [facility] OR;  Service: General;  Laterality: N/A;   left knee meniscus repair  07/21/1990   right rotator cuff  07/21/1990   right  shoulder arthroscopy  07/21/2009   SPINE SURGERY  10/14       Home Medications    Prior to Admission medications   Medication Sig Start Date End Date Taking? Authorizing Provider  doxycycline (VIBRAMYCIN) 100 MG capsule Take 1 capsule (100 mg total) by mouth 2 (two) times daily. 07/13/23  Yes Brenton Grills, MD  predniSONE (STERAPRED UNI-PAK 21 TAB) 10 MG (21) TBPK tablet Take by mouth daily. Take 6 tabs by mouth daily  for 2 days, then 5 tabs for 2 days, then 4 tabs for 2 days, then 3 tabs for 2 days, 2 tabs for 2 days, then 1 tab by mouth daily for 2 days 07/13/23  Yes Brenton Grills, MD  albuterol (VENTOLIN HFA) 108 (90 Base) MCG/ACT inhaler Inhale 2 puffs into the lungs every 6 (six) hours as needed for wheezing or shortness of breath (AS NEEDED FOR SHORTNESS OF BREATH). 02/20/23   Leslye Peer, MD  aspirin EC 81 MG EC tablet Take 1 tablet (81 mg total) by mouth daily. 07/09/19   Black, Lesle Chris, NP  clobetasol cream (TEMOVATE) 0.05 % 1 APPLICATION EXTERNALLY TWICE A DAY 30 DAYS    [provider]  diazepam (VALIUM) 5 MG tablet Take 1 tablet (5 mg total) by mouth daily as needed. Patient taking differently: Take 5 mg by mouth daily as needed. For MRI and Flying only per pt 03/09/23   Ardith Dark, MD  diphenoxylate-atropine (LOMOTIL) 2.5-0.025 MG tablet Take 1 tablet by mouth 2 (two) times daily. 06/10/23   Armbruster, Willaim Rayas, MD  dutasteride (AVODART) 0.5 MG capsule Take 0.5 mg by mouth daily. 12/31/22   [provider]  fluticasone (FLONASE) 50 MCG/ACT nasal spray SPRAY 2 SPRAYS INTO EACH NOSTRIL EVERY DAY    [provider]  glucosamine-chondroitin 500-400 MG tablet Take 3 tablets by mouth daily. Patient taking differently: Take 2 tablets by mouth daily. 02/15/18   Olive Bass, FNP  hydrochlorothiazide (MICROZIDE) 12.5 MG capsule Take 1 capsule (12.5 mg total) by mouth daily. 03/09/23   Ardith Dark, MD  ibuprofen (ADVIL) 800 MG tablet Take 800 mg by mouth every 8 (eight) hours as needed. 03/05/23   [provider]  losartan (COZAAR) 100 MG tablet TAKE 1 TABLET BY MOUTH EVERY DAY 04/01/23   Ardith Dark, MD  methocarbamol (ROBAXIN) 500 MG tablet Take 1 tablet (500 mg total) by mouth every 8 (eight) hours as needed for muscle spasms. 06/02/23   Ardith Dark, MD  mirabegron ER (MYRBETRIQ) 50 MG TB24 tablet Take 1 tablet (50 mg total) by mouth daily. 07/09/23   Ardith Dark, MD  rosuvastatin (CRESTOR) 5 MG tablet TAKE 1 TABLET (5 MG TOTAL) BY MOUTH DAILY. 04/16/23   Ardith Dark, MD  tamsulosin (FLOMAX) 0.4 MG CAPS capsule Take 0.4 mg by mouth daily.    [provider]  Dwyane Luo 100-62.5-25 MCG/ACT AEPB INHALE 1 PUFF BY MOUTH EVERY DAY 07/02/23   Parrett, Virgel Bouquet, NP    Family History Family History  Problem  Relation Age of Onset   Leukemia Mother    Throat cancer Father    Esophageal cancer Father    Cancer Father        esophageal cancer   Breast cancer Sister    Cancer Sister    Prostate cancer Brother    Melanoma Brother    Colon cancer Neg Hx    Stomach cancer Neg Hx    Pancreatic  cancer Neg Hx    Liver disease Neg Hx     Social History Social History   Tobacco Use   Smoking status: Former    Current packs/day: 0.00    Average packs/day: 1.5 packs/day for 43.0 years (64.5 ttl pk-yrs)    Types: Cigarettes    Start date: 02/22/1960    Quit date: 02/22/2003    Years since quitting: 20.4   Smokeless tobacco: Never  Vaping Use   Vaping status: Never Used  Substance Use Topics   Alcohol use: Not Currently    Comment: 1988   Drug use: Not Currently    Types: Cocaine, Heroin    Comment: 26 years ago cocaine, heroin. methadone     Allergies   Patient has no known allergies.   Review of Systems Review of Systems  Respiratory:  Positive for wheezing.      Physical Exam Triage Vital Signs ED Triage Vitals  Encounter Vitals Group     BP 07/13/23 1015 (!) 146/78     Systolic BP Percentile --      Diastolic BP Percentile --      Pulse Rate 07/13/23 1015 73     Resp 07/13/23 1015 20     Temp 07/13/23 1015 97.6 F (36.4 C)     Temp Source 07/13/23 1015 Oral     SpO2 07/13/23 1015 95 %     Weight --      Height --      Head Circumference --      Peak Flow --      Pain Score 07/13/23 1013 0     Pain Loc --      Pain Education --      Exclude from Growth Chart --    No data found.  Updated Vital Signs BP (!) 146/78 (BP Location: Right Arm)   Pulse 73   Temp 97.6 F (36.4 C) (Oral)   Resp 20   SpO2 95%   Visual Acuity Right Eye Distance:   Left Eye Distance:   Bilateral Distance:    Right Eye Near:   Left Eye Near:    Bilateral Near:     Physical Exam Constitutional:      Appearance: Normal appearance.  Eyes:     General:        Right eye: No  discharge.        Left eye: No discharge.     Extraocular Movements: Extraocular movements intact.     Conjunctiva/sclera: Conjunctivae normal.  Cardiovascular:     Rate and Rhythm: Normal rate.  Pulmonary:     Effort: Pulmonary effort is normal. No respiratory distress.     Breath sounds: No stridor. Wheezing present. No rales.  Neurological:     Mental Status: He is alert.      UC Treatments / Results  Labs (all labs ordered are listed, but only abnormal results are displayed) Labs Reviewed - No data to display  EKG   Radiology No results found.  Procedures Procedures (including critical care time)  Medications Ordered in UC Medications - No data to display  Initial Impression / Assessment and Plan / UC Course  I have reviewed the triage vital signs and the nursing notes.  Pertinent labs & imaging results that were available during my care of the patient were reviewed by me and considered in my medical decision making (see chart for details).     Patient presenting with likely exacerbation of emphysema/COPD.  Discussed with  patient at this time he will not likely need antibiotics however will send antibiotics if patient has over the holidays.  Patient is going do a prednisone taper over the next few days.  Patient was advised that if he feels like the steroid is not doing enough then he can start taking the antibiotic though discussed appropriate antibiotic usage with patient and he is agreeable.  Patient has no other concerns at this time. Final Clinical Impressions(s) / UC Diagnoses   Final diagnoses:  Wheezing     Discharge Instructions      Please follow instructions on your steroid package. Please only take your antibiotic if you feel like you are not getting better with steroid alone.     ED Prescriptions     Medication Sig Dispense Auth. Provider   predniSONE (STERAPRED UNI-PAK 21 TAB) 10 MG (21) TBPK tablet Take by mouth daily. Take 6 tabs by mouth  daily  for 2 days, then 5 tabs for 2 days, then 4 tabs for 2 days, then 3 tabs for 2 days, 2 tabs for 2 days, then 1 tab by mouth daily for 2 days 42 tablet Brenton Grills, MD   doxycycline (VIBRAMYCIN) 100 MG capsule Take 1 capsule (100 mg total) by mouth 2 (two) times daily. 20 capsule Brenton Grills, MD      PDMP not reviewed this encounter.   Brenton Grills, MD 07/13/23 [phone]

## 2023-07-13 NOTE — Telephone Encounter (Signed)
 Please start a PA thanks

## 2023-07-13 NOTE — ED Triage Notes (Signed)
Patient reports this is an "emphysema flare up" Symptoms started Friday.  Patient reports nasal congestion and sob .  Reports this happens every 2-3 months.  Reports his pulmonologist is aware.    Patient reports he is not taking anything for symptoms currently

## 2023-07-16 ENCOUNTER — Other Ambulatory Visit (HOSPITAL_COMMUNITY): Payer: Self-pay

## 2023-07-17 ENCOUNTER — Other Ambulatory Visit (HOSPITAL_COMMUNITY): Payer: Self-pay

## 2023-07-17 NOTE — Telephone Encounter (Signed)
Please can you send a PA  Thanks

## 2023-07-17 NOTE — Telephone Encounter (Signed)
Please read previous response:  Per test claim:  Brand Myrbetriq is preferred by the insurance.  If suggested medication is appropriate, Please send in a new RX and discontinue this one. If not, please advise as to why it's not appropriate so that we may request a Prior Authorization. Please note, some preferred medications may still require a PA    *patient pharmacy called to reprocess claim, current co-pay $111.02/30 tab -pharmacy stated it was not in stock and that they would order for the patient

## 2023-07-17 NOTE — Telephone Encounter (Signed)
Pharmacy Patient Advocate Encounter   Received notification from RX Request Messages that prior authorization for Myrbetriq 50mg  is required/requested.   Insurance verification completed.   The patient is insured through [facility] ADVANTAGE/RX ADVANCE .   Per test claim:  Brand Myrbetriq is preferred by the insurance.  If suggested medication is appropriate, Please send in a new RX and discontinue this one. If not, please advise as to why it's not appropriate so that we may request a Prior Authorization. Please note, some preferred medications may still require a PA   *patient pharmacy called to reprocess claim, current co-pay $111.02/30 tab -pharmacy stated it was not in stock and that they would order for the patient

## 2023-07-20 ENCOUNTER — Other Ambulatory Visit: Payer: Self-pay | Admitting: *Deleted

## 2023-07-20 MED ORDER — MIRABEGRON ER 50 MG PO TB24: 50.0000 mg | ORAL_TABLET | Freq: Every day | ORAL | 5 refills | Status: DC

## 2023-07-20 NOTE — Telephone Encounter (Signed)
Pharmacy comment: Alternative Requested:INS NOT COVER.

## 2023-07-28 ENCOUNTER — Other Ambulatory Visit: Payer: Self-pay | Admitting: Surgery

## 2023-07-28 DIAGNOSIS — C220 Liver cell carcinoma: Secondary | ICD-10-CM

## 2023-07-28 DIAGNOSIS — H31002 Unspecified chorioretinal scars, left eye: Secondary | ICD-10-CM | POA: Diagnosis not present

## 2023-07-31 DIAGNOSIS — Z8546 Personal history of malignant neoplasm of prostate: Secondary | ICD-10-CM | POA: Diagnosis not present

## 2023-08-06 ENCOUNTER — Ambulatory Visit: Payer: PPO | Admitting: Family Medicine

## 2023-08-06 ENCOUNTER — Encounter: Payer: Self-pay | Admitting: Family Medicine

## 2023-08-06 VITALS — BP 129/74 | HR 69 | Temp 97.9°F | Ht 70.0 in | Wt 176.8 lb

## 2023-08-06 DIAGNOSIS — R059 Cough, unspecified: Secondary | ICD-10-CM

## 2023-08-06 DIAGNOSIS — R197 Diarrhea, unspecified: Secondary | ICD-10-CM | POA: Diagnosis not present

## 2023-08-06 DIAGNOSIS — K580 Irritable bowel syndrome with diarrhea: Secondary | ICD-10-CM

## 2023-08-06 DIAGNOSIS — I10 Essential (primary) hypertension: Secondary | ICD-10-CM | POA: Diagnosis not present

## 2023-08-06 LAB — POC COVID19 BINAXNOW: SARS Coronavirus 2 Ag: NEGATIVE

## 2023-08-06 NOTE — Assessment & Plan Note (Signed)
 Blood pressure at goal today on HCTZ 12.5 mg daily and losartan 100 mg daily.

## 2023-08-06 NOTE — Assessment & Plan Note (Signed)
Flareup recently due to his viral illness.  No red flag signs or symptoms.  He will continue Lomotil as needed.  He will let us know if symptoms are not improving in the next few days.

## 2023-08-06 NOTE — Progress Notes (Signed)
   George Watson is a 78 y.o. male who presents today for an office visit.  Assessment/Plan:  New/Acute Problems: Diarrhea  No red flags.  His systemic symptoms are improving though still having some diarrhea.  Likely has viral GI illness.  Also probably had a flareup of IBS due to this as well.  He will continue with conservative measures.  He does have Lomotil to use as needed for his IBS which is working well.  We encouraged hydration.  He will let us know if symptoms worsen or do not improve in the next few days.  Chronic Problems Addressed Today: Essential hypertension Blood pressure at goal today on HCTZ 12.5 mg daily and losartan 100 mg daily.  IBS (irritable bowel syndrome) Flareup recently due to his viral illness.  No red flag signs or symptoms.  He will continue Lomotil as needed.  He will let us know if symptoms are not improving in the next few days.     Subjective:  HPI:  See Assessment / plan for status of chronic conditions. Patient here today with chills, fever, body aches, and diarrhea. Wife has been sick with similar symptoms. She saw her physician and was diagnosed with a "bad cold." He has had a flare up of his IBS. Symtpoms started 3 days ago.  No nausea or vomiting. He is not having cough or congestion. Tried taking tylenol with some improvement. No blood in stool. No melena or hematochezia.        Objective:  Physical Exam: BP 129/74   Pulse 69   Temp 97.9 F (36.6 C) (Temporal)   Ht 5\' 10"  (1.778 m)   Wt 176 lb 12.8 oz (80.2 kg)   SpO2 95%   BMI 25.37 kg/m   Gen: No acute distress, resting comfortably CV: Regular rate and rhythm with no murmurs appreciated Pulm: Normal work of breathing, clear to auscultation bilaterally with no crackles, wheezes, or rhonchi Gastroenterology: Bowel sounds present soft, nontender, nondistended. Neuro: Grossly normal, moves all extremities Psych: Normal affect and thought content      Kambry Takacs M. Jimmey Ralph, MD 08/06/2023  9:35 AM

## 2023-08-06 NOTE — Patient Instructions (Signed)
It was very nice to see you today!  I think you probably have a stomach bug.  This should improve over the next few days.  Please make sure that you are getting plenty of fluids.  You can continue taking Lomotil as needed.  Let us know if you have any worsening symptoms or if your symptoms do not improve.  Return if symptoms worsen or fail to improve.   Take care, Dr Jimmey Ralph  PLEASE NOTE:  If you had any lab tests, please let us know if you have not heard back within a few days. You may see your results on mychart before we have a chance to review them but we will give you a call once they are reviewed by [place].   If we ordered any referrals today, please let us know if you have not heard from their office within the next week.   If you had any urgent prescriptions sent in today, please check with the pharmacy within an hour of our visit to make sure the prescription was transmitted appropriately.   Please try these tips to maintain a healthy lifestyle:  Eat at least 3 REAL meals and 1-2 snacks per day.  Aim for no more than 5 hours between eating.  If you eat breakfast, please do so within one hour of getting up.   Each meal should contain half fruits/vegetables, one quarter protein, and one quarter carbs (no bigger than a computer mouse)  Cut down on sweet beverages. This includes juice, soda, and sweet tea.   Drink at least 1 glass of water with each meal and aim for at least 8 glasses per day  Exercise at least 150 minutes every week.

## 2023-08-07 ENCOUNTER — Encounter: Payer: Self-pay | Admitting: Gastroenterology

## 2023-08-07 DIAGNOSIS — R197 Diarrhea, unspecified: Secondary | ICD-10-CM

## 2023-08-07 MED ORDER — DIPHENOXYLATE-ATROPINE 2.5-0.025 MG PO TABS: 1.0000 | ORAL_TABLET | Freq: Four times a day (QID) | ORAL | 0 refills | Status: DC | PRN

## 2023-08-10 ENCOUNTER — Other Ambulatory Visit: Payer: Self-pay

## 2023-08-10 ENCOUNTER — Telehealth: Payer: Self-pay

## 2023-08-10 MED ORDER — DIPHENOXYLATE-ATROPINE 2.5-0.025 MG PO TABS: 1.0000 | ORAL_TABLET | Freq: Four times a day (QID) | ORAL | 0 refills | Status: DC | PRN

## 2023-08-10 NOTE — Telephone Encounter (Signed)
[employer] targeted order in epic. Requisition has been printed and placed in Dr. Lanetta Inch IN box for wet signature. Patient has been notified via MyChart.  Kit will be placed at 2nd floor receptionist desk once order has been signed.

## 2023-08-10 NOTE — Telephone Encounter (Signed)
Script was sent by Vernia Buff to [facility] on 1-17 for #40, Take 4 times daily as needed. Pharmacist said he got a 90 day supply in November so he is filling too soon. In November he was taking BID and now it is up to 4 days prn.  Pharmacist ran override.

## 2023-08-10 NOTE — Telephone Encounter (Signed)
Jan, are you able to follow up on Lomotil prescription? Thanks  Kit has been picked up by patient's mother.

## 2023-08-10 NOTE — Addendum Note (Signed)
Addended by: Marisa Sprinkles on: 08/10/2023 04:24 PM   Modules accepted: Orders

## 2023-08-10 NOTE — Telephone Encounter (Signed)
Called and spoke to pharmacist at [facility].  The script sent in November was for BID and was a 90 day supply so it is too soon for the patient to get new script.  Since patient sig has change to QID prn, she ran an override.  Insurance will not pay, however with a coupon card it would be $20. Resent script with #80 since #40 would only be a 10 day supply and could get more with the coupon code.  [facility]axed to [facility] pharmacy.  Called and explained all to patient. He expressed understand and was very appreciative

## 2023-08-11 DIAGNOSIS — R197 Diarrhea, unspecified: Secondary | ICD-10-CM | POA: Diagnosis not present

## 2023-08-20 ENCOUNTER — Encounter: Payer: Self-pay | Admitting: Gastroenterology

## 2023-08-25 ENCOUNTER — Encounter: Payer: Self-pay | Admitting: Family Medicine

## 2023-08-26 ENCOUNTER — Telehealth: Payer: Self-pay | Admitting: Gastroenterology

## 2023-08-26 NOTE — Telephone Encounter (Signed)
 [place] can you please let this patient know that his stool test for C Diff and infection is NEGATIVE.  Can you see how he is feeling? Has diarrhea persisted? Thanks

## 2023-08-26 NOTE — Telephone Encounter (Signed)
Lm on vm for patient to return call.   MyChart message sent to patient with results and additional questions.

## 2023-08-26 NOTE — Telephone Encounter (Signed)
 Patient called and stated that he was returning a call to [place]. Patient is requesting a call back. Please advise.

## 2023-08-27 NOTE — Telephone Encounter (Signed)
 MyChart response from patient - "It has finally gotten better but still dealing with IBS. Don't imagine that's going to change. "

## 2023-08-28 NOTE — Telephone Encounter (Signed)
MyChart message sent to patient with response.

## 2023-08-28 NOTE — Telephone Encounter (Signed)
 Okay. Well, he has had IBS longstanding, perhaps that was made worse recently. Glad to hear his acute issues have improved. If he wishes to see me in the office to discuss long term plan further I am happy to do so, can book a routine follow up. Thanks

## 2023-08-31 ENCOUNTER — Telehealth: Payer: Self-pay | Admitting: Family Medicine

## 2023-08-31 NOTE — Telephone Encounter (Signed)
 Ok with me. Please place any necessary orders.

## 2023-08-31 NOTE — Telephone Encounter (Signed)
 See please advise

## 2023-08-31 NOTE — Telephone Encounter (Signed)
 Called pt and r/s OV from 2/17 to 2/18 due to pcp being out of office. OV is meant to be a 3 month f/u and pt wants to know if he could have usual labs drawn the day prior to visit. States he wants to talk to PCP about lab results at the time of visit on 2/18. Please advise.

## 2023-09-01 ENCOUNTER — Other Ambulatory Visit: Payer: Self-pay | Admitting: *Deleted

## 2023-09-01 NOTE — Telephone Encounter (Signed)
Order was placed on 05/2023 Schedule a lab appt prior to CPE

## 2023-09-01 NOTE — Telephone Encounter (Signed)
Pt has been scheduled for 2/17 @ 8:45 am.

## 2023-09-07 ENCOUNTER — Ambulatory Visit: Payer: PPO | Admitting: Family Medicine

## 2023-09-07 ENCOUNTER — Other Ambulatory Visit (INDEPENDENT_AMBULATORY_CARE_PROVIDER_SITE_OTHER): Payer: PPO

## 2023-09-07 DIAGNOSIS — E785 Hyperlipidemia, unspecified: Secondary | ICD-10-CM | POA: Diagnosis not present

## 2023-09-07 DIAGNOSIS — R7303 Prediabetes: Secondary | ICD-10-CM

## 2023-09-07 LAB — CBC
HCT: 46.4 % (ref 39.0–52.0)
Hemoglobin: 15.8 g/dL (ref 13.0–17.0)
MCHC: 34.1 g/dL (ref 30.0–36.0)
MCV: 93.1 fL (ref 78.0–100.0)
Platelets: 218 10*3/uL (ref 150.0–400.0)
RBC: 4.98 Mil/uL (ref 4.22–5.81)
RDW: 13 % (ref 11.5–15.5)
WBC: 9.1 10*3/uL (ref 4.0–10.5)

## 2023-09-07 LAB — HEMOGLOBIN A1C: Hgb A1c MFr Bld: 6.5 % (ref 4.6–6.5)

## 2023-09-07 LAB — COMPREHENSIVE METABOLIC PANEL
ALT: 26 U/L (ref 0–53)
AST: 21 U/L (ref 0–37)
Albumin: 4.5 g/dL (ref 3.5–5.2)
Alkaline Phosphatase: 54 U/L (ref 39–117)
BUN: 18 mg/dL (ref 6–23)
CO2: 27 meq/L (ref 19–32)
Calcium: 9.8 mg/dL (ref 8.4–10.5)
Chloride: 103 meq/L (ref 96–112)
Creatinine, Ser: 0.9 mg/dL (ref 0.40–1.50)
GFR: 83.03 mL/min (ref >60.00)
Glucose, Bld: 126 mg/dL — ABNORMAL HIGH (ref 70–99)
Potassium: 4 meq/L (ref 3.5–5.1)
Sodium: 138 meq/L (ref 135–145)
Total Bilirubin: 0.7 mg/dL (ref 0.2–1.2)
Total Protein: 7.1 g/dL (ref 6.0–8.3)

## 2023-09-07 LAB — LIPID PANEL
Cholesterol: 140 mg/dL (ref 0–200)
HDL: 30.7 mg/dL — ABNORMAL LOW (ref >39.00)
LDL Cholesterol: 70 mg/dL (ref 0–99)
NonHDL: 109.69
Total CHOL/HDL Ratio: 5
Triglycerides: 196 mg/dL — ABNORMAL HIGH (ref 0.0–149.0)
VLDL: 39.2 mg/dL (ref 0.0–40.0)

## 2023-09-08 ENCOUNTER — Encounter: Payer: Self-pay | Admitting: Family Medicine

## 2023-09-08 ENCOUNTER — Ambulatory Visit: Payer: PPO | Admitting: Family Medicine

## 2023-09-08 ENCOUNTER — Encounter: Payer: Self-pay | Admitting: Gastroenterology

## 2023-09-08 VITALS — BP 131/62 | HR 89 | Temp 98.4°F | Ht 70.0 in | Wt 176.0 lb

## 2023-09-08 DIAGNOSIS — E785 Hyperlipidemia, unspecified: Secondary | ICD-10-CM | POA: Diagnosis not present

## 2023-09-08 DIAGNOSIS — M47816 Spondylosis without myelopathy or radiculopathy, lumbar region: Secondary | ICD-10-CM

## 2023-09-08 DIAGNOSIS — R7303 Prediabetes: Secondary | ICD-10-CM | POA: Diagnosis not present

## 2023-09-08 DIAGNOSIS — M1990 Unspecified osteoarthritis, unspecified site: Secondary | ICD-10-CM | POA: Diagnosis not present

## 2023-09-08 DIAGNOSIS — M51379 Other intervertebral disc degeneration, lumbosacral region without mention of lumbar back pain or lower extremity pain: Secondary | ICD-10-CM | POA: Diagnosis not present

## 2023-09-08 DIAGNOSIS — K580 Irritable bowel syndrome with diarrhea: Secondary | ICD-10-CM | POA: Diagnosis not present

## 2023-09-08 DIAGNOSIS — I10 Essential (primary) hypertension: Secondary | ICD-10-CM

## 2023-09-08 MED ORDER — METHOCARBAMOL 500 MG PO TABS: 500.0000 mg | ORAL_TABLET | Freq: Three times a day (TID) | ORAL | 5 refills | Status: AC | PRN

## 2023-09-08 NOTE — Assessment & Plan Note (Signed)
Follows with orthopedics.  Uses Robaxin as needed.  Will refill today.  Tolerating well.  No significant side effects.

## 2023-09-08 NOTE — Assessment & Plan Note (Signed)
 Blood pressure at goal today on HCTZ 12.5 mg daily and losartan 100 mg daily.

## 2023-09-08 NOTE — Assessment & Plan Note (Signed)
A1c stable 6.5.  He is working on lifestyle modifications.  Recheck in 3 months.

## 2023-09-08 NOTE — Assessment & Plan Note (Signed)
He has recovered from his recent flare. Bowel movements are now back to normal. Continue management per gastroenterology.

## 2023-09-08 NOTE — Patient Instructions (Signed)
It was very nice to see you today!  I will refill your Robaxin.  We will check a cardiac CT scan.  Will see you back in 3 months.  Come back sooner if needed.  Return in about 3 months (around 12/06/2023) for Follow Up.   Take care, Dr Jimmey Ralph  PLEASE NOTE:  If you had any lab tests, please let us know if you have not heard back within a few days. You may see your results on mychart before we have a chance to review them but we will give you a call once they are reviewed by [place].   If we ordered any referrals today, please let us know if you have not heard from their office within the next week.   If you had any urgent prescriptions sent in today, please check with the pharmacy within an hour of our visit to make sure the prescription was transmitted appropriately.   Please try these tips to maintain a healthy lifestyle:  Eat at least 3 REAL meals and 1-2 snacks per day.  Aim for no more than 5 hours between eating.  If you eat breakfast, please do so within one hour of getting up.   Each meal should contain half fruits/vegetables, one quarter protein, and one quarter carbs (no bigger than a computer mouse)  Cut down on sweet beverages. This includes juice, soda, and sweet tea.   Drink at least 1 glass of water with each meal and aim for at least 8 glasses per day  Exercise at least 150 minutes every week.

## 2023-09-08 NOTE — Progress Notes (Signed)
His A1c is 6.5.  This is similar to his last a few months ago.  The rest of his labs are at goal.  We can discuss more at his upcoming office visit.

## 2023-09-08 NOTE — Assessment & Plan Note (Signed)
Lipids at goal.  Continue Crestor 5 mg daily.  Will check cardiac CT scan today per patient request.

## 2023-09-08 NOTE — Assessment & Plan Note (Deleted)
Follows with orthopedics.  Uses Robaxin as needed.  Will refill today.  Tolerating well.  No significant side effects.

## 2023-09-08 NOTE — Progress Notes (Signed)
   George Watson is a 78 y.o. male who presents today for an office visit.  Assessment/Plan:  Chronic Problems Addressed Today: Prediabetes A1c stable 6.5.  He is working on lifestyle modifications.  Recheck in 3 months.  Dyslipidemia Lipids at goal.  Continue Crestor 5 mg daily.  Will check cardiac CT scan today per patient request.  Essential hypertension Blood pressure at goal today on HCTZ 12.5 mg daily and losartan 100 mg daily.  IBS (irritable bowel syndrome) He has recovered from his recent flare. Bowel movements are now back to normal. Continue management per gastroenterology.   Lumbar spondylosis Follows with orthopedics.  Uses Robaxin as needed.  Will refill today.  Tolerating well.  No significant side effects.     Subjective:  HPI:  See Assessment / plan for status of chronic conditions.  He is doing well today.  No acute concerns.  Last saw him about a month ago.  His diarrhea has resolved.        Objective:  Physical Exam: BP 131/62   Pulse 89   Temp 98.4 F (36.9 C) (Temporal)   Ht 5\' 10"  (1.778 m)   Wt 176 lb (79.8 kg)   SpO2 94%   BMI 25.25 kg/m   Gen: No acute distress, resting comfortably CV: Regular rate and rhythm with no murmurs appreciated Pulm: Normal work of breathing, clear to auscultation bilaterally with no crackles, wheezes, or rhonchi Neuro: Grossly normal, moves all extremities Psych: Normal affect and thought content      Terissa Haffey M. Jimmey Ralph, MD 09/08/2023 2:20 PM

## 2023-09-15 DIAGNOSIS — D1801 Hemangioma of skin and subcutaneous tissue: Secondary | ICD-10-CM | POA: Diagnosis not present

## 2023-09-15 DIAGNOSIS — L570 Actinic keratosis: Secondary | ICD-10-CM | POA: Diagnosis not present

## 2023-09-15 DIAGNOSIS — L821 Other seborrheic keratosis: Secondary | ICD-10-CM | POA: Diagnosis not present

## 2023-09-15 DIAGNOSIS — L814 Other melanin hyperpigmentation: Secondary | ICD-10-CM | POA: Diagnosis not present

## 2023-09-15 DIAGNOSIS — D229 Melanocytic nevi, unspecified: Secondary | ICD-10-CM | POA: Diagnosis not present

## 2023-09-15 DIAGNOSIS — L578 Other skin changes due to chronic exposure to nonionizing radiation: Secondary | ICD-10-CM | POA: Diagnosis not present

## 2023-09-17 ENCOUNTER — Ambulatory Visit (HOSPITAL_COMMUNITY)
Admission: RE | Admit: 2023-09-17 | Discharge: 2023-09-17 | Disposition: A | Discharge status: Home / Self Care | Payer: Self-pay | Source: Ambulatory Visit | Attending: Family Medicine | Admitting: Family Medicine

## 2023-09-17 DIAGNOSIS — E785 Hyperlipidemia, unspecified: Secondary | ICD-10-CM | POA: Insufficient documentation

## 2023-09-18 ENCOUNTER — Encounter: Payer: Self-pay | Admitting: Family Medicine

## 2023-09-18 NOTE — Progress Notes (Signed)
 His CT scan shows that he has a moderate amount of calcification in a few of his arteries.  This is mildly above average for his age.  He did also have a small dilation in his aorta.  This is not an urgent concern however it would be a good idea for him to see cardiology for ongoing surveillance and management.  Please place referral if he is agreeable.George Watson

## 2023-09-18 NOTE — Telephone Encounter (Signed)
 Patient stated he been seen by cardiology  Unsure when is his next appt

## 2023-09-18 NOTE — Telephone Encounter (Signed)
 See note

## 2023-09-22 NOTE — Telephone Encounter (Signed)
 Please see my result note. He has a cardiothoracic surgeon but not a regular cardiologist from what I can tell.  Recommend referral if he is agreeable.

## 2023-09-23 ENCOUNTER — Ambulatory Visit
Admission: RE | Admit: 2023-09-23 | Discharge: 2023-09-23 | Disposition: A | Discharge status: Home / Self Care | Payer: PPO | Source: Ambulatory Visit | Attending: Surgery

## 2023-09-23 DIAGNOSIS — I700 Atherosclerosis of aorta: Secondary | ICD-10-CM | POA: Diagnosis not present

## 2023-09-23 DIAGNOSIS — C220 Liver cell carcinoma: Secondary | ICD-10-CM | POA: Diagnosis not present

## 2023-09-23 DIAGNOSIS — K7460 Unspecified cirrhosis of liver: Secondary | ICD-10-CM | POA: Diagnosis not present

## 2023-09-23 MED ORDER — GADOPICLENOL 0.5 MMOL/ML IV SOLN
8.0000 mL | Freq: Once | INTRAVENOUS | Status: AC | PRN
  Administered 2023-09-23: 8 mL via INTRAVENOUS

## 2023-09-25 DIAGNOSIS — C220 Liver cell carcinoma: Secondary | ICD-10-CM | POA: Diagnosis not present

## 2023-09-28 ENCOUNTER — Other Ambulatory Visit (HOSPITAL_BASED_OUTPATIENT_CLINIC_OR_DEPARTMENT_OTHER): Payer: Self-pay | Admitting: Family Medicine

## 2023-09-28 DIAGNOSIS — I10 Essential (primary) hypertension: Secondary | ICD-10-CM

## 2023-09-29 ENCOUNTER — Encounter (HOSPITAL_COMMUNITY): Payer: Self-pay

## 2023-09-29 ENCOUNTER — Other Ambulatory Visit: Payer: Self-pay | Admitting: Family Medicine

## 2023-09-29 ENCOUNTER — Ambulatory Visit (HOSPITAL_COMMUNITY)
Admission: EM | Admit: 2023-09-29 | Discharge: 2023-09-29 | Disposition: A | Discharge status: Home / Self Care | Attending: Family Medicine | Admitting: Family Medicine

## 2023-09-29 DIAGNOSIS — E785 Hyperlipidemia, unspecified: Secondary | ICD-10-CM

## 2023-09-29 DIAGNOSIS — J441 Chronic obstructive pulmonary disease with (acute) exacerbation: Secondary | ICD-10-CM

## 2023-09-29 MED ORDER — PREDNISONE 20 MG PO TABS: ORAL_TABLET | ORAL | 0 refills | Status: DC

## 2023-09-29 MED ORDER — DOXYCYCLINE HYCLATE 100 MG PO CAPS
100.0000 mg | ORAL_CAPSULE | Freq: Two times a day (BID) | ORAL | 0 refills | Status: AC
Start: 2023-09-29 — End: 2023-10-06

## 2023-09-29 NOTE — Discharge Instructions (Signed)
 Take prednisone 20 mg--3 tabs daily x3 days, then 2 tabs daily x3 days, then 1 tab daily x3 days, then one half tab daily x3 days, then stop  Take doxycycline 100 mg --1 capsule 2 times daily for 7 days

## 2023-09-29 NOTE — ED Provider Notes (Signed)
 [facility]    CSN: [id number] Arrival date & time: 09/29/23  1243      History   Chief Complaint Chief Complaint  Patient presents with   Shortness of Breath    HPI George Watson is a 78 y.o. male.    Shortness of Breath Here for wheezing and congestion in his chest.  Started yesterday.  No fever and no nasal congestion or postnasal drainage.  He feels this is typical for his COPD exacerbations that he has every few months.  No fever and no vomiting or diarrhea.  NKDA   Past Medical History:  Diagnosis Date   Aortic aneurysm (HCC)    Arthritis    BPH (benign prostatic hypertrophy)    Cataract 2015   Cirrhosis (HCC)    COPD (chronic obstructive pulmonary disease) (HCC)    DDD (degenerative disc disease), lumbosacral    Dyspnea    Elevated PSA    Emphysema of lung (HCC)    Essential hypertension, benign    diet controlled   Fracture of shaft of clavicle    GERD (gastroesophageal reflux disease)    H/O drug abuse (HCC)    multisubstance   Hepatitis B    Hepatitis C 10/1996   History of hepatitis C 10/19/1996   Dr. [name] with [facility]. Treated in 2014. Followed yearly.      HSV-2 (herpes simplex virus 2) infection    Hx of biopsy 08/2004   liver   Hx of colonic polyps 2022   Hyperlipidemia    Inguinal hernia    right   Other and unspecified hyperlipidemia    Prostate cancer (HCC)    Substance abuse (HCC)    Tuberculosis     Patient Active Problem List   Diagnosis Date Noted   IBS (irritable bowel syndrome) 03/02/2023   Specific phobia 03/02/2023   Osteoarthritis 03/02/2023   Essential hypertension 11/24/2022   Hepatocellular carcinoma (HCC) s/p resection 2023 10/14/2021   Lung nodule 10/05/2020   Dilated aortic root (HCC) 06/13/2019   Prediabetes 01/06/2017   Atherosclerosis of aorta (HCC) 07/09/2016   Dyslipidemia    HSV-2 (herpes simplex virus 2) infection    COPD (chronic obstructive pulmonary disease) (HCC)    Benign  prostatic hyperplasia    Lumbar spondylosis    Prostate cancer (HCC)    Cirrhosis (HCC) 05/21/1997    Past Surgical History:  Procedure Laterality Date   BACK SURGERY     CERVICAL SPINE SURGERY     EYE SURGERY  3/20   fracture ribs     3   HERNIA REPAIR     INGUINAL HERNIA REPAIR  07/01/2012   Procedure: HERNIA REPAIR INGUINAL ADULT;  Surgeon: Lodema Pilot, DO;  Location: [facility];  Service: General;  Laterality: Right;  with Mesh   LAPAROSCOPIC LIVER ULTRASOUND N/A 11/21/2021   Procedure: LAPAROSCOPIC LIVER ULTRASOUND;  Surgeon: Fritzi Mandes, MD;  Location: [facility] OR;  Service: General;  Laterality: N/A;   LAPAROSCOPIC PARTIAL HEPATECTOMY Left 11/21/2021   Procedure: LAPAROSCOPIC PARTIAL HEPATECTOMY;  Surgeon: Fritzi Mandes, MD;  Location: [facility] OR;  Service: General;  Laterality: Left;   LAPAROSCOPY N/A 11/21/2021   Procedure: STAGING LAPAROSCOPY;  Surgeon: Fritzi Mandes, MD;  Location: [facility] OR;  Service: General;  Laterality: N/A;   left knee meniscus repair  07/21/1990   right rotator cuff  07/21/1990   right shoulder arthroscopy  07/21/2009   SPINE SURGERY  10/14       Home  Medications    Prior to Admission medications   Medication Sig Start Date End Date Taking? Authorizing Provider  doxycycline (VIBRAMYCIN) 100 MG capsule Take 1 capsule (100 mg total) by mouth 2 (two) times daily for 7 days. 09/29/23 10/06/23 Yes Zenia Resides, MD  predniSONE (DELTASONE) 20 MG tablet 3 tabs daily x3 days, then 2 tabs daily x3 days, then 1 tab daily x3 days, then one half tab daily x3 days, then stop 09/29/23  Yes Quill Grinder, Janace Aris, MD  albuterol (VENTOLIN HFA) 108 (90 Base) MCG/ACT inhaler Inhale 2 puffs into the lungs every 6 (six) hours as needed for wheezing or shortness of breath (AS NEEDED FOR SHORTNESS OF BREATH). 02/20/23   Leslye Peer, MD  aspirin EC 81 MG EC tablet Take 1 tablet (81 mg total) by mouth daily. 07/09/19   Black, Lesle Chris, NP  clobetasol cream (TEMOVATE) 0.05 % 1  APPLICATION EXTERNALLY TWICE A DAY 30 DAYS    [provider]  diazepam (VALIUM) 5 MG tablet Take 1 tablet (5 mg total) by mouth daily as needed. Patient taking differently: Take 5 mg by mouth daily as needed. For MRI and Flying only per pt 03/09/23   Ardith Dark, MD  diphenoxylate-atropine (LOMOTIL) 2.5-0.025 MG tablet Take 1 tablet by mouth 4 (four) times daily as needed for diarrhea or loose stools. 08/10/23   Armbruster, Willaim Rayas, MD  dutasteride (AVODART) 0.5 MG capsule Take 0.5 mg by mouth daily. 12/31/22   [provider]  fluticasone (FLONASE) 50 MCG/ACT nasal spray SPRAY 2 SPRAYS INTO EACH NOSTRIL EVERY DAY    [provider]  glucosamine-chondroitin 500-400 MG tablet Take 3 tablets by mouth daily. Patient taking differently: Take 2 tablets by mouth daily. 02/15/18   Olive Bass, FNP  hydrochlorothiazide (MICROZIDE) 12.5 MG capsule Take 1 capsule (12.5 mg total) by mouth daily. 03/09/23   Ardith Dark, MD  losartan (COZAAR) 100 MG tablet TAKE 1 TABLET BY MOUTH EVERY DAY 09/28/23   Ardith Dark, MD  methocarbamol (ROBAXIN) 500 MG tablet Take 1 tablet (500 mg total) by mouth every 8 (eight) hours as needed for muscle spasms. 09/08/23   Ardith Dark, MD  rosuvastatin (CRESTOR) 5 MG tablet TAKE 1 TABLET (5 MG TOTAL) BY MOUTH DAILY. 09/29/23   Ardith Dark, MD  tamsulosin (FLOMAX) 0.4 MG CAPS capsule Take 0.4 mg by mouth daily.    [provider]  Dwyane Luo 100-62.5-25 MCG/ACT AEPB INHALE 1 PUFF BY MOUTH EVERY DAY 07/02/23   Parrett, Virgel Bouquet, NP    Family History Family History  Problem Relation Age of Onset   Leukemia Mother    Throat cancer Father    Esophageal cancer Father    Cancer Father        esophageal cancer   Breast cancer Sister    Cancer Sister    Prostate cancer Brother    Melanoma Brother    Colon cancer Neg Hx    Stomach cancer Neg Hx    Pancreatic cancer Neg Hx    Liver disease Neg Hx     Social  History Social History   Tobacco Use   Smoking status: Former    Current packs/day: 0.00    Average packs/day: 1.5 packs/day for 43.0 years (64.5 ttl pk-yrs)    Types: Cigarettes    Start date: 02/22/1960    Quit date: 02/22/2003    Years since quitting: 20.6   Smokeless tobacco: Never  Vaping  Use   Vaping status: Never Used  Substance Use Topics   Alcohol use: Not Currently    Comment: 1988   Drug use: Not Currently    Types: Cocaine, Heroin    Comment: 26 years ago cocaine, heroin. methadone     Allergies   Patient has no known allergies.   Review of Systems Review of Systems  Respiratory:  Positive for shortness of breath.      Physical Exam Triage Vital Signs ED Triage Vitals  Encounter Vitals Group     BP 09/29/23 1306 (!) 147/83     Systolic BP Percentile --      Diastolic BP Percentile --      Pulse Rate 09/29/23 1306 75     Resp 09/29/23 1306 16     Temp 09/29/23 1306 98.3 F (36.8 C)     Temp Source 09/29/23 1306 Oral     SpO2 09/29/23 1306 92 %     Weight 09/29/23 1306 170 lb (77.1 kg)     Height 09/29/23 1306 5\' 10"  (1.778 m)     Head Circumference --      Peak Flow --      Pain Score 09/29/23 1303 0     Pain Loc --      Pain Education --      Exclude from Growth Chart --    No data found.  Updated Vital Signs BP (!) 147/83 (BP Location: Right Arm)   Pulse 75   Temp 98.3 F (36.8 C) (Oral)   Resp 16   Ht 5\' 10"  (1.778 m)   Wt 77.1 kg   SpO2 92%   BMI 24.39 kg/m   Visual Acuity Right Eye Distance:   Left Eye Distance:   Bilateral Distance:    Right Eye Near:   Left Eye Near:    Bilateral Near:     Physical Exam Vitals reviewed.  Constitutional:      General: He is not in acute distress.    Appearance: He is not ill-appearing, toxic-appearing or diaphoretic.  HENT:     Nose: Nose normal.     Mouth/Throat:     Mouth: Mucous membranes are moist.  Eyes:     Extraocular Movements: Extraocular movements intact.      Conjunctiva/sclera: Conjunctivae normal.     Pupils: Pupils are equal, round, and reactive to light.  Cardiovascular:     Rate and Rhythm: Normal rate and regular rhythm.     Heart sounds: No murmur heard. Pulmonary:     Comments: There is bilateral expiratory wheezing with mildly prolonged expiratory phase, and some rales. Musculoskeletal:     Cervical back: Neck supple.  Lymphadenopathy:     Cervical: No cervical adenopathy.  Skin:    Coloration: Skin is not pale.  Neurological:     General: No focal deficit present.     Mental Status: He is alert and oriented to person, place, and time.  Psychiatric:        Behavior: Behavior normal.      UC Treatments / Results  Labs (all labs ordered are listed, but only abnormal results are displayed) Labs Reviewed - No data to display  EKG   Radiology No results found.  Procedures Procedures (including critical care time)  Medications Ordered in UC Medications - No data to display  Initial Impression / Assessment and Plan / UC Course  I have reviewed the triage vital signs and the nursing notes.  Pertinent labs & imaging  results that were available during my care of the patient were reviewed by me and considered in my medical decision making (see chart for details).     O2 sat is 92% but other vitals are reassuring.  He is actually in no acute distress in the exam room.  Prednisone taper sent in for the COPD exacerbation and doxycycline is sent in also.  He has albuterol at home already. Final Clinical Impressions(s) / UC Diagnoses   Final diagnoses:  COPD exacerbation (HCC)     Discharge Instructions      Take prednisone 20 mg--3 tabs daily x3 days, then 2 tabs daily x3 days, then 1 tab daily x3 days, then one half tab daily x3 days, then stop  Take doxycycline 100 mg --1 capsule 2 times daily for 7 days       ED Prescriptions     Medication Sig Dispense Auth. Provider   predniSONE (DELTASONE) 20 MG  tablet 3 tabs daily x3 days, then 2 tabs daily x3 days, then 1 tab daily x3 days, then one half tab daily x3 days, then stop 20 tablet Girl Schissler, Janace Aris, MD   doxycycline (VIBRAMYCIN) 100 MG capsule Take 1 capsule (100 mg total) by mouth 2 (two) times daily for 7 days. 14 capsule Marlinda Mike, Janace Aris, MD      PDMP not reviewed this encounter.   Zenia Resides, MD 09/29/23 1340

## 2023-09-29 NOTE — ED Triage Notes (Signed)
 Patient here today with c/o SOB, wheeze, and cough X 1 day. Patient has emphysema.

## 2023-10-04 ENCOUNTER — Encounter: Payer: Self-pay | Admitting: Family Medicine

## 2023-10-05 MED ORDER — FLUTICASONE PROPIONATE 50 MCG/ACT NA SUSP: 2.0000 | Freq: Every day | NASAL | 11 refills | Status: AC

## 2023-10-05 NOTE — Telephone Encounter (Signed)
Last refilled by historical provider  

## 2023-10-08 DIAGNOSIS — M5386 Other specified dorsopathies, lumbar region: Secondary | ICD-10-CM | POA: Diagnosis not present

## 2023-10-08 DIAGNOSIS — M9901 Segmental and somatic dysfunction of cervical region: Secondary | ICD-10-CM | POA: Diagnosis not present

## 2023-10-08 DIAGNOSIS — M47817 Spondylosis without myelopathy or radiculopathy, lumbosacral region: Secondary | ICD-10-CM | POA: Diagnosis not present

## 2023-10-08 DIAGNOSIS — M9903 Segmental and somatic dysfunction of lumbar region: Secondary | ICD-10-CM | POA: Diagnosis not present

## 2023-10-08 DIAGNOSIS — S29012A Strain of muscle and tendon of back wall of thorax, initial encounter: Secondary | ICD-10-CM | POA: Diagnosis not present

## 2023-10-08 DIAGNOSIS — M9902 Segmental and somatic dysfunction of thoracic region: Secondary | ICD-10-CM | POA: Diagnosis not present

## 2023-10-08 DIAGNOSIS — M47812 Spondylosis without myelopathy or radiculopathy, cervical region: Secondary | ICD-10-CM | POA: Diagnosis not present

## 2023-10-08 DIAGNOSIS — M9905 Segmental and somatic dysfunction of pelvic region: Secondary | ICD-10-CM | POA: Diagnosis not present

## 2023-10-08 NOTE — Progress Notes (Signed)
 His cardiac CT scan did show a small pulmonary nodule.  Due to his smoking history we probably should check this again in a year.

## 2023-10-13 DIAGNOSIS — M9902 Segmental and somatic dysfunction of thoracic region: Secondary | ICD-10-CM | POA: Diagnosis not present

## 2023-10-13 DIAGNOSIS — S29012A Strain of muscle and tendon of back wall of thorax, initial encounter: Secondary | ICD-10-CM | POA: Diagnosis not present

## 2023-10-13 DIAGNOSIS — M47817 Spondylosis without myelopathy or radiculopathy, lumbosacral region: Secondary | ICD-10-CM | POA: Diagnosis not present

## 2023-10-13 DIAGNOSIS — M47812 Spondylosis without myelopathy or radiculopathy, cervical region: Secondary | ICD-10-CM | POA: Diagnosis not present

## 2023-10-13 DIAGNOSIS — M9905 Segmental and somatic dysfunction of pelvic region: Secondary | ICD-10-CM | POA: Diagnosis not present

## 2023-10-13 DIAGNOSIS — M9903 Segmental and somatic dysfunction of lumbar region: Secondary | ICD-10-CM | POA: Diagnosis not present

## 2023-10-13 DIAGNOSIS — M5386 Other specified dorsopathies, lumbar region: Secondary | ICD-10-CM | POA: Diagnosis not present

## 2023-10-13 DIAGNOSIS — M9901 Segmental and somatic dysfunction of cervical region: Secondary | ICD-10-CM | POA: Diagnosis not present

## 2023-10-21 DIAGNOSIS — L570 Actinic keratosis: Secondary | ICD-10-CM | POA: Diagnosis not present

## 2023-10-21 DIAGNOSIS — T1490XD Injury, unspecified, subsequent encounter: Secondary | ICD-10-CM | POA: Diagnosis not present

## 2023-10-21 DIAGNOSIS — L719 Rosacea, unspecified: Secondary | ICD-10-CM | POA: Diagnosis not present

## 2023-10-23 DIAGNOSIS — M5386 Other specified dorsopathies, lumbar region: Secondary | ICD-10-CM | POA: Diagnosis not present

## 2023-10-23 DIAGNOSIS — M47817 Spondylosis without myelopathy or radiculopathy, lumbosacral region: Secondary | ICD-10-CM | POA: Diagnosis not present

## 2023-10-23 DIAGNOSIS — M9903 Segmental and somatic dysfunction of lumbar region: Secondary | ICD-10-CM | POA: Diagnosis not present

## 2023-10-23 DIAGNOSIS — S29012A Strain of muscle and tendon of back wall of thorax, initial encounter: Secondary | ICD-10-CM | POA: Diagnosis not present

## 2023-10-23 DIAGNOSIS — M9902 Segmental and somatic dysfunction of thoracic region: Secondary | ICD-10-CM | POA: Diagnosis not present

## 2023-10-23 DIAGNOSIS — M47812 Spondylosis without myelopathy or radiculopathy, cervical region: Secondary | ICD-10-CM | POA: Diagnosis not present

## 2023-10-23 DIAGNOSIS — M9901 Segmental and somatic dysfunction of cervical region: Secondary | ICD-10-CM | POA: Diagnosis not present

## 2023-10-30 DIAGNOSIS — M9903 Segmental and somatic dysfunction of lumbar region: Secondary | ICD-10-CM | POA: Diagnosis not present

## 2023-10-30 DIAGNOSIS — M5386 Other specified dorsopathies, lumbar region: Secondary | ICD-10-CM | POA: Diagnosis not present

## 2023-10-30 DIAGNOSIS — M47817 Spondylosis without myelopathy or radiculopathy, lumbosacral region: Secondary | ICD-10-CM | POA: Diagnosis not present

## 2023-10-30 DIAGNOSIS — S29012A Strain of muscle and tendon of back wall of thorax, initial encounter: Secondary | ICD-10-CM | POA: Diagnosis not present

## 2023-10-30 DIAGNOSIS — M9901 Segmental and somatic dysfunction of cervical region: Secondary | ICD-10-CM | POA: Diagnosis not present

## 2023-10-30 DIAGNOSIS — M9902 Segmental and somatic dysfunction of thoracic region: Secondary | ICD-10-CM | POA: Diagnosis not present

## 2023-10-30 DIAGNOSIS — M47812 Spondylosis without myelopathy or radiculopathy, cervical region: Secondary | ICD-10-CM | POA: Diagnosis not present

## 2023-11-16 ENCOUNTER — Other Ambulatory Visit: Payer: Self-pay | Admitting: Urology

## 2023-11-16 DIAGNOSIS — Z8546 Personal history of malignant neoplasm of prostate: Secondary | ICD-10-CM | POA: Diagnosis not present

## 2023-11-16 DIAGNOSIS — R3915 Urgency of urination: Secondary | ICD-10-CM | POA: Diagnosis not present

## 2023-11-16 DIAGNOSIS — N401 Enlarged prostate with lower urinary tract symptoms: Secondary | ICD-10-CM | POA: Diagnosis not present

## 2023-11-16 DIAGNOSIS — R3911 Hesitancy of micturition: Secondary | ICD-10-CM | POA: Diagnosis not present

## 2023-11-19 DIAGNOSIS — M9903 Segmental and somatic dysfunction of lumbar region: Secondary | ICD-10-CM | POA: Diagnosis not present

## 2023-11-19 DIAGNOSIS — M9902 Segmental and somatic dysfunction of thoracic region: Secondary | ICD-10-CM | POA: Diagnosis not present

## 2023-11-19 DIAGNOSIS — M5386 Other specified dorsopathies, lumbar region: Secondary | ICD-10-CM | POA: Diagnosis not present

## 2023-11-19 DIAGNOSIS — M47817 Spondylosis without myelopathy or radiculopathy, lumbosacral region: Secondary | ICD-10-CM | POA: Diagnosis not present

## 2023-11-19 DIAGNOSIS — M9901 Segmental and somatic dysfunction of cervical region: Secondary | ICD-10-CM | POA: Diagnosis not present

## 2023-11-19 DIAGNOSIS — M47812 Spondylosis without myelopathy or radiculopathy, cervical region: Secondary | ICD-10-CM | POA: Diagnosis not present

## 2023-11-19 DIAGNOSIS — S29012A Strain of muscle and tendon of back wall of thorax, initial encounter: Secondary | ICD-10-CM | POA: Diagnosis not present

## 2023-11-20 ENCOUNTER — Encounter: Payer: Self-pay | Admitting: Family Medicine

## 2023-11-20 DIAGNOSIS — R7303 Prediabetes: Secondary | ICD-10-CM

## 2023-11-20 NOTE — Addendum Note (Signed)
 Addended by: Winona Haw on: 11/20/2023 10:08 AM   Modules accepted: Orders

## 2023-11-20 NOTE — Telephone Encounter (Signed)
 Spoke to pt told him he is not due for lab work only an A1c that can be done in the office and result right away. If you like you can come in before hand to have done. Pt said yes he would like to. Told him okay, I will place order for A1c. Appt scheduled for 12/07/2023 at 9:00 AM. Pt verbalized understanding. Order put in Epic.

## 2023-11-30 ENCOUNTER — Encounter (HOSPITAL_COMMUNITY): Payer: Self-pay

## 2023-12-04 ENCOUNTER — Other Ambulatory Visit

## 2023-12-07 ENCOUNTER — Encounter: Payer: Self-pay | Admitting: Family Medicine

## 2023-12-07 ENCOUNTER — Ambulatory Visit (INDEPENDENT_AMBULATORY_CARE_PROVIDER_SITE_OTHER): Payer: PPO | Admitting: Family Medicine

## 2023-12-07 ENCOUNTER — Other Ambulatory Visit: Payer: Self-pay | Admitting: Gastroenterology

## 2023-12-07 ENCOUNTER — Other Ambulatory Visit

## 2023-12-07 VITALS — BP 121/67 | HR 71 | Temp 98.4°F | Wt 173.6 lb

## 2023-12-07 DIAGNOSIS — E559 Vitamin D deficiency, unspecified: Secondary | ICD-10-CM

## 2023-12-07 DIAGNOSIS — I10 Essential (primary) hypertension: Secondary | ICD-10-CM

## 2023-12-07 DIAGNOSIS — R5383 Other fatigue: Secondary | ICD-10-CM | POA: Diagnosis not present

## 2023-12-07 DIAGNOSIS — R7303 Prediabetes: Secondary | ICD-10-CM | POA: Diagnosis not present

## 2023-12-07 DIAGNOSIS — R931 Abnormal findings on diagnostic imaging of heart and coronary circulation: Secondary | ICD-10-CM | POA: Insufficient documentation

## 2023-12-07 DIAGNOSIS — J449 Chronic obstructive pulmonary disease, unspecified: Secondary | ICD-10-CM

## 2023-12-07 LAB — POCT GLYCOSYLATED HEMOGLOBIN (HGB A1C): Hemoglobin A1C: 6.3 % — AB (ref 4.0–5.6)

## 2023-12-07 NOTE — Assessment & Plan Note (Signed)
 Overall symptoms are stable.  On Trelegy and albuterol  as needed per pulmonology.

## 2023-12-07 NOTE — Assessment & Plan Note (Signed)
 A1c stay was 6.3.  Recheck in 3 months.  He is doing a great job with lifestyle interventions.

## 2023-12-07 NOTE — Progress Notes (Signed)
   George Watson is a 78 y.o. male who presents today for an office visit.  Assessment/Plan:  New/Acute Problems: Other Fatigue  No red flags.  We did discuss sleep study to rule out OSA however he like to hold off on this for now.  We will check labs.  Chronic Problems Addressed Today: Elevated coronary artery calcium  score Discussed his most recent cardiac scan which did show elevated coronary artery calcium  score.  He is on Crestor  5 mg daily and aspirin  81 mg daily.  We did discuss having him follow-up with cardiology however he would like to hold off on this for now.  COPD (chronic obstructive pulmonary disease) Overall symptoms are stable.  On Trelegy and albuterol  as needed per pulmonology.  Prediabetes A1c stay was 6.3.  Recheck in 3 months.  He is doing a great job with lifestyle interventions.  Essential hypertension Blood pressure at goal today on HCTZ 12.5 mg daily and losartan  100 mg daily.     Subjective:  HPI:  See Assessment / plan for status of chronic conditions. Patient here today for 3 month follow up.  He has noticed some fatigue for the last few weeks.  No recent illnesses.  No fevers or chills.  No recent change in medications.  He is feeling tired throughout the day.  He has not concern for sleep apnea.  Sleeps about 7 hours per night.  No witnessed snoring.  No witnessed apneic episodes.       Objective:  Physical Exam: BP 121/67   Pulse 71   Temp 98.4 F (36.9 C) (Temporal)   Wt 173 lb 9.6 oz (78.7 kg)   SpO2 94%   BMI 24.91 kg/m   Gen: No acute distress, resting comfortably CV: Regular rate and rhythm with no murmurs appreciated Pulm: Normal work of breathing, occasional rhonchi noted otherwise clear to auscultation. Neuro: Grossly normal, moves all extremities Psych: Normal affect and thought content      Ocia Simek M. Daneil Dunker, MD 12/07/2023 1:56 PM

## 2023-12-07 NOTE — Assessment & Plan Note (Signed)
 Blood pressure at goal today on HCTZ 12.5 mg daily and losartan 100 mg daily.

## 2023-12-07 NOTE — Assessment & Plan Note (Signed)
 Discussed his most recent cardiac scan which did show elevated coronary artery calcium  score.  He is on Crestor  5 mg daily and aspirin  81 mg daily.  We did discuss having him follow-up with cardiology however he would like to hold off on this for now.

## 2023-12-08 ENCOUNTER — Other Ambulatory Visit (INDEPENDENT_AMBULATORY_CARE_PROVIDER_SITE_OTHER)

## 2023-12-08 DIAGNOSIS — E559 Vitamin D deficiency, unspecified: Secondary | ICD-10-CM

## 2023-12-08 DIAGNOSIS — R5383 Other fatigue: Secondary | ICD-10-CM | POA: Diagnosis not present

## 2023-12-08 DIAGNOSIS — R7303 Prediabetes: Secondary | ICD-10-CM | POA: Diagnosis not present

## 2023-12-08 LAB — CBC
HCT: 43.3 % (ref 39.0–52.0)
Hemoglobin: 14.4 g/dL (ref 13.0–17.0)
MCHC: 33.3 g/dL (ref 30.0–36.0)
MCV: 91.4 fl (ref 78.0–100.0)
Platelets: 267 10*3/uL (ref 150.0–400.0)
RBC: 4.74 Mil/uL (ref 4.22–5.81)
RDW: 13.2 % (ref 11.5–15.5)
WBC: 11.7 10*3/uL — ABNORMAL HIGH (ref 4.0–10.5)

## 2023-12-08 LAB — COMPREHENSIVE METABOLIC PANEL WITH GFR
ALT: 19 U/L (ref 0–53)
AST: 20 U/L (ref 0–37)
Albumin: 4.2 g/dL (ref 3.5–5.2)
Alkaline Phosphatase: 69 U/L (ref 39–117)
BUN: 29 mg/dL — ABNORMAL HIGH (ref 6–23)
CO2: 22 meq/L (ref 19–32)
Calcium: 9.9 mg/dL (ref 8.4–10.5)
Chloride: 106 meq/L (ref 96–112)
Creatinine, Ser: 0.83 mg/dL (ref 0.40–1.50)
GFR: 84.93 mL/min (ref >60.00)
Glucose, Bld: 116 mg/dL — ABNORMAL HIGH (ref 70–99)
Potassium: 4.4 meq/L (ref 3.5–5.1)
Sodium: 139 meq/L (ref 135–145)
Total Bilirubin: 0.5 mg/dL (ref 0.2–1.2)
Total Protein: 7.1 g/dL (ref 6.0–8.3)

## 2023-12-08 LAB — TSH: TSH: 2.67 u[IU]/mL (ref 0.35–5.50)

## 2023-12-08 LAB — VITAMIN D 25 HYDROXY (VIT D DEFICIENCY, FRACTURES): VITD: 49.75 ng/mL (ref 30.00–100.00)

## 2023-12-08 LAB — VITAMIN B12: Vitamin B-12: 258 pg/mL (ref 211–911)

## 2023-12-08 LAB — HEMOGLOBIN A1C: Hgb A1c MFr Bld: 6.6 % — ABNORMAL HIGH (ref 4.6–6.5)

## 2023-12-09 ENCOUNTER — Ambulatory Visit: Payer: Self-pay | Admitting: Family Medicine

## 2023-12-09 NOTE — Progress Notes (Signed)
 His white blood cell count is elevated.  This may indicate a recent infection.  Recommend he come back in a few weeks to recheck CBC with differential.  BUN is also elevated which may indicate dehydration.  We can recheck this in a few weeks.  Please place future order for CMET.  His A1c is up a little bit compared to previous.  Does not need to start medications at this point however he should continue to work on low-carb and low sugar diet.  We can recheck in 3 months.  B12 is in the low range of normal.  This may be causing some of his fatigue issues as well.  Recommend he start B12 supplementation 1000 mcg daily.  We can recheck this in 3 months.

## 2023-12-16 ENCOUNTER — Encounter: Payer: Self-pay | Admitting: Family Medicine

## 2023-12-16 ENCOUNTER — Other Ambulatory Visit: Payer: Self-pay | Admitting: *Deleted

## 2023-12-16 DIAGNOSIS — M47817 Spondylosis without myelopathy or radiculopathy, lumbosacral region: Secondary | ICD-10-CM | POA: Diagnosis not present

## 2023-12-16 DIAGNOSIS — M5386 Other specified dorsopathies, lumbar region: Secondary | ICD-10-CM | POA: Diagnosis not present

## 2023-12-16 DIAGNOSIS — M9903 Segmental and somatic dysfunction of lumbar region: Secondary | ICD-10-CM | POA: Diagnosis not present

## 2023-12-16 DIAGNOSIS — D729 Disorder of white blood cells, unspecified: Secondary | ICD-10-CM

## 2023-12-16 DIAGNOSIS — M9902 Segmental and somatic dysfunction of thoracic region: Secondary | ICD-10-CM | POA: Diagnosis not present

## 2023-12-16 DIAGNOSIS — E860 Dehydration: Secondary | ICD-10-CM

## 2023-12-16 DIAGNOSIS — S29012A Strain of muscle and tendon of back wall of thorax, initial encounter: Secondary | ICD-10-CM | POA: Diagnosis not present

## 2023-12-16 DIAGNOSIS — M47812 Spondylosis without myelopathy or radiculopathy, cervical region: Secondary | ICD-10-CM | POA: Diagnosis not present

## 2023-12-16 DIAGNOSIS — M9901 Segmental and somatic dysfunction of cervical region: Secondary | ICD-10-CM | POA: Diagnosis not present

## 2023-12-16 NOTE — Telephone Encounter (Signed)
 See note

## 2023-12-17 NOTE — Telephone Encounter (Signed)
 Recommend at least 64 ounces of non caffeineated liquid daily, ideally low sugar.  George Watson. Daneil Dunker, MD 12/17/2023 7:30 AM

## 2023-12-20 ENCOUNTER — Ambulatory Visit
Admission: RE | Admit: 2023-12-20 | Discharge: 2023-12-20 | Disposition: A | Discharge status: Home / Self Care | Source: Ambulatory Visit | Attending: Urology | Admitting: Urology

## 2023-12-20 DIAGNOSIS — C61 Malignant neoplasm of prostate: Secondary | ICD-10-CM | POA: Diagnosis not present

## 2023-12-20 DIAGNOSIS — Z8546 Personal history of malignant neoplasm of prostate: Secondary | ICD-10-CM

## 2023-12-20 MED ORDER — GADOPICLENOL 0.5 MMOL/ML IV SOLN
9.0000 mL | Freq: Once | INTRAVENOUS | Status: AC | PRN
  Administered 2023-12-20: 9 mL via INTRAVENOUS

## 2023-12-23 ENCOUNTER — Encounter: Payer: Self-pay | Admitting: Gastroenterology

## 2023-12-23 ENCOUNTER — Other Ambulatory Visit (INDEPENDENT_AMBULATORY_CARE_PROVIDER_SITE_OTHER)

## 2023-12-23 ENCOUNTER — Encounter: Payer: Self-pay | Admitting: Family Medicine

## 2023-12-23 ENCOUNTER — Ambulatory Visit: Admitting: Gastroenterology

## 2023-12-23 VITALS — BP 124/72 | HR 73 | Ht 70.0 in | Wt 177.4 lb

## 2023-12-23 DIAGNOSIS — E860 Dehydration: Secondary | ICD-10-CM

## 2023-12-23 DIAGNOSIS — C220 Liver cell carcinoma: Secondary | ICD-10-CM | POA: Diagnosis not present

## 2023-12-23 DIAGNOSIS — D729 Disorder of white blood cells, unspecified: Secondary | ICD-10-CM | POA: Diagnosis not present

## 2023-12-23 DIAGNOSIS — K7460 Unspecified cirrhosis of liver: Secondary | ICD-10-CM | POA: Diagnosis not present

## 2023-12-23 DIAGNOSIS — F1091 Alcohol use, unspecified, in remission: Secondary | ICD-10-CM

## 2023-12-23 DIAGNOSIS — K580 Irritable bowel syndrome with diarrhea: Secondary | ICD-10-CM | POA: Diagnosis not present

## 2023-12-23 LAB — COMPREHENSIVE METABOLIC PANEL WITH GFR
ALT: 22 U/L (ref 0–53)
AST: 18 U/L (ref 0–37)
Albumin: 4.2 g/dL (ref 3.5–5.2)
Alkaline Phosphatase: 59 U/L (ref 39–117)
BUN: 26 mg/dL — ABNORMAL HIGH (ref 6–23)
CO2: 27 meq/L (ref 19–32)
Calcium: 9.8 mg/dL (ref 8.4–10.5)
Chloride: 103 meq/L (ref 96–112)
Creatinine, Ser: 0.93 mg/dL (ref 0.40–1.50)
GFR: 79.66 mL/min (ref >60.00)
Glucose, Bld: 137 mg/dL — ABNORMAL HIGH (ref 70–99)
Potassium: 4.3 meq/L (ref 3.5–5.1)
Sodium: 137 meq/L (ref 135–145)
Total Bilirubin: 0.5 mg/dL (ref 0.2–1.2)
Total Protein: 7 g/dL (ref 6.0–8.3)

## 2023-12-23 LAB — CBC WITH DIFFERENTIAL/PLATELET
Basophils Absolute: 0.1 10*3/uL (ref 0.0–0.1)
Basophils Relative: 0.6 % (ref 0.0–3.0)
Eosinophils Absolute: 0.5 10*3/uL (ref 0.0–0.7)
Eosinophils Relative: 4.2 % (ref 0.0–5.0)
HCT: 43.8 % (ref 39.0–52.0)
Hemoglobin: 14.8 g/dL (ref 13.0–17.0)
Lymphocytes Relative: 21.3 % (ref 12.0–46.0)
Lymphs Abs: 2.3 10*3/uL (ref 0.7–4.0)
MCHC: 33.8 g/dL (ref 30.0–36.0)
MCV: 90.9 fl (ref 78.0–100.0)
Monocytes Absolute: 1.4 10*3/uL — ABNORMAL HIGH (ref 0.1–1.0)
Monocytes Relative: 13.2 % — ABNORMAL HIGH (ref 3.0–12.0)
Neutro Abs: 6.6 10*3/uL (ref 1.4–7.7)
Neutrophils Relative %: 60.7 % (ref 43.0–77.0)
Platelets: 229 10*3/uL (ref 150.0–400.0)
RBC: 4.82 Mil/uL (ref 4.22–5.81)
RDW: 13.4 % (ref 11.5–15.5)
WBC: 10.8 10*3/uL — ABNORMAL HIGH (ref 4.0–10.5)

## 2023-12-23 MED ORDER — DIPHENOXYLATE-ATROPINE 2.5-0.025 MG PO TABS
1.0000 | ORAL_TABLET | Freq: Two times a day (BID) | ORAL | 1 refills | Status: DC
Start: 2023-12-23 — End: 2024-02-16

## 2023-12-23 NOTE — Patient Instructions (Signed)
 We have sent the following medications to your pharmacy for you to pick up at your convenience:  Lomotil   Please follow up in 6 months  _______________________________________________________  If your blood pressure at your visit was 140/90 or greater, please contact your primary care physician to follow up on this.  _______________________________________________________  If you are age 77 or older, your body mass index should be between 23-30. Your Body mass index is 25.45 kg/m. If this is out of the aforementioned range listed, please consider follow up with your Primary Care Provider.  If you are age 49 or younger, your body mass index should be between 19-25. Your Body mass index is 25.45 kg/m. If this is out of the aformentioned range listed, please consider follow up with your Primary Care Provider.   ________________________________________________________  The [place] [facility] providers would like to encourage you to use MYCHART to communicate with providers for non-urgent requests or questions.  Due to long hold times on the telephone, sending your provider a message by [facility] may be a faster and more efficient way to get a response.  Please allow 48 business hours for a response.  Please remember that this is for non-urgent requests.  _______________________________________________________

## 2023-12-23 NOTE — Progress Notes (Signed)
 HPI :  78 year old male here for follow-up visit.  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    Liver dz history: History of hepatitis C since 1990s, treated with multiple rounds of interferon and ribavirin which he failed.  He eventually had treatment at [facility] in 2014 for hep C with a different regimen and was eradicated.  Remote alcohol use but sober for > 30 years.  There has been suggestions on remote ultrasounds that he had underlying cirrhosis however ultrasounds in more recent years have not shown any evidence of that.  He has not had any decompensations of his liver. Unfortunately found to have [facility] In March 2023 on a screening US . He was referred to oncology and underwent a liver biopsy which confirmed HCC.  He was then referred to surgery, Dr. Leighton Punches, had resection of the mass with clear margins on Nov 21 2021. He has been followed with serial MRIs.   SINCE LAST VISIT   He has been followed with serial imaging since the resection.  Last MRI of his liver was performed on 09/25/23.  There is unchanged postoperative appearance without any obvious recurrent disease, radiology is recommending a repeat MRI in another 6 months.  Recall his imaging over years has suggested cirrhosis on some scans, and others has not.  Spleen and platelet count is normal, he has cirrhosis reported on his last MRI.  He had no varices on EGD in 2022, platelet count again remains normal.  He has had no jaundice, no encephalopathy, no ascites.  He is well compensated.  Does not drink any alcohol.  Otherwise he carries a diagnosis of suspected postinfectious IBS-D.  His colonoscopy is up-to-date.  He has had an extensive workup for this in the past.  He had some worsening this past February when he called in about a weeks worth of persistent diarrhea that is worse than normal and at the time was not controlled with his  Lomotil .  C. difficile stool test was done and negative.  He states after about a week his symptoms resolved back to normal.  Historically he has been on Lomotil  as it works pretty well for him, 2 tabs daily.  He has a few bowel movements per day, some urgency, without blood.  If he takes the Lomotil  his symptoms are pretty well-controlled, he takes an additional dose if needed for traveling etc.  We discussed if he wants to try different regimen or continue with what he is on.  He is quite happy with his current regimen at baseline.      Prior evaluation: Colonoscopy 09/11/20 - 7mm rectal polyp, divertculosis, small hemorrhoids, normal ileum - biopsies taken to rule out microscopic colitis  - polyp sessile serrated, normal colon otherwise - repeat colon in 5 years    EGD 12/05/20: - The exam of the esophagus was otherwise normal. No varices. - The entire examined stomach was normal. No varices. - A single small angiodysplastic lesion was found in the second portion of the duodenum. - The duodenal bulb and second portion of the duodenum were otherwise normal.   C diff positive 01/10/21: Treated with vancomycin     Liver biopsy 10/24/21: FINAL MICROSCOPIC DIAGNOSIS:   A. LIVER, LEFT LOBE, BIOPSY:  - Moderately differentiated hepatocellular carcinoma, see comment   Had liver resection with Dr. Leighton Punches 5/4, discharged on 5/5 Preoperative Diagnosis: Hepatocellular carcinoma Postoperative Diagnosis: Same  Procedure: Staging laparoscopy, laparoscopic partial left hepatectomy (segment 3), intraoperative liver ultrasound   FINAL MICROSCOPIC DIAGNOSIS:   A.   LIVER, LEFT LATERAL, PARTIAL HEPATECTOMY:  -    Hepatocellular carcinoma, grade 2 (moderately differentiated), 2.8  cm in greatest dimension, margin negative for invasive carcinoma.  -    Non-cirrhotic liver with mild macrovesicular steatosis.      MRI liver 08/18/22: IMPRESSION: 1. Postsurgical changes from segment 3 wedge resection  without recurrent/residual disease. 2. Unchanged multifocal hepatic subcentimeter arterially enhancing foci without washout or capsule appearance. No definite new focal lesions. LI-RADS 3. Recommend continued attention on follow-up.     MRI liver with contrast 02/23/23: IMPRESSION: 1. Cirrhosis and segment 3 hepatic resection without recurrent or metastatic disease. 2. Numerous arterial phase hyperenhancing observations throughout the liver again identified and are considered LI-RADS 3, intermediate probability. These can be re-evaluated on MRI follow-up at 6 months. 3.  Aortic Atherosclerosis (ICD10-I70.0)  Diarrhea in Feb - tested negative for C diff  MRI liver 09/23/23: IMPRESSION: 1. Unchanged postoperative appearance of the inferior margin of hepatic segment III without evidence of recurrent soft tissue or suspicious contrast enhancement, assessment somewhat limited by postoperative susceptibility artifact in this vicinity. LI-RADS TR, nonviable. 2. There are again numerous tiny, generally irregular and mostly peripheral transient foci of arterial hyperenhancement, somewhat more conspicuous on today's examination although most likely not significantly changed accounting for earlier phase of arterial contrast. No evidence of washout or capsular enhancement associated with these lesions. These remain consistent with transient perfusional, regenerative, or dysplastic enhancement and remain LI-RADS category 3 lesions, recommend follow-up in 6 months. 3. Cirrhosis. 4. No evidence of lymphadenopathy or metastatic disease in the abdomen.     Past Medical History:  Diagnosis Date   Aortic aneurysm (HCC)    Arthritis    BPH (benign prostatic hypertrophy)    Cataract 2015   Cirrhosis (HCC)    COPD (chronic obstructive pulmonary disease) (HCC)    DDD (degenerative disc disease), lumbosacral    Dyspnea    Elevated PSA    Emphysema of lung (HCC)    Essential hypertension,  benign    diet controlled   Fracture of shaft of clavicle    GERD (gastroesophageal reflux disease)    H/O drug abuse (HCC)    multisubstance   Hepatitis B    Hepatitis C 10/1996   History of hepatitis C 10/19/1996   Dr. Andriette Keeling with [facility]. Treated in 2014. Followed yearly.      HSV-2 (herpes simplex virus 2) infection    Hx of biopsy 08/2004   liver   Hx of colonic polyps 2022   Hyperlipidemia    Inguinal hernia    right   Other and unspecified hyperlipidemia    Prostate cancer (HCC)    Substance abuse (HCC)    Tuberculosis      Past Surgical History:  Procedure Laterality Date   BACK SURGERY     CERVICAL SPINE SURGERY     EYE SURGERY  3/20   fracture ribs     3   HERNIA REPAIR     INGUINAL HERNIA REPAIR  07/01/2012   Procedure: HERNIA REPAIR INGUINAL ADULT;  Surgeon: Evander Hills, DO;  Location: [facility] ORS;  Service: General;  Laterality: Right;  with Mesh   LAPAROSCOPIC LIVER ULTRASOUND N/A 11/21/2021   Procedure: LAPAROSCOPIC LIVER ULTRASOUND;  Surgeon: Lujean Sake, MD;  Location: [facility] OR;  Service: General;  Laterality: N/A;   LAPAROSCOPIC PARTIAL HEPATECTOMY Left  11/21/2021   Procedure: LAPAROSCOPIC PARTIAL HEPATECTOMY;  Surgeon: Lujean Sake, MD;  Location: [facility] OR;  Service: General;  Laterality: Left;   LAPAROSCOPY N/A 11/21/2021   Procedure: STAGING LAPAROSCOPY;  Surgeon: Lujean Sake, MD;  Location[facility] OR;  Service: General;  Laterality: N/A;   left knee meniscus repair  07/21/1990   right rotator cuff  07/21/1990   right shoulder arthroscopy  07/21/2009   SPINE SURGERY  10/14   Family History  Problem Relation Age of Onset   Leukemia Mother    Throat cancer Father    Esophageal cancer Father    Cancer Father        esophageal cancer   Breast cancer Sister    Cancer Sister    Prostate cancer Brother    Melanoma Brother    Colon cancer Neg Hx    Stomach cancer Neg Hx    Pancreatic cancer Neg Hx    Liver disease Neg Hx    Social History    Tobacco Use   Smoking status: Former    Current packs/day: 0.00    Average packs/day: 1.5 packs/day for 43.0 years (64.5 ttl pk-yrs)    Types: Cigarettes    Start date: 02/22/1960    Quit date: 02/22/2003    Years since quitting: 20.8   Smokeless tobacco: Never  Vaping Use   Vaping status: Never Used  Substance Use Topics   Alcohol use: Not Currently    Comment: 1988   Drug use: Not Currently    Types: Cocaine, Heroin    Comment: 26 years ago cocaine, heroin. methadone   Current Outpatient Medications  Medication Sig Dispense Refill   albuterol  (VENTOLIN  HFA) 108 (90 Base) MCG/ACT inhaler Inhale 2 puffs into the lungs every 6 (six) hours as needed for wheezing or shortness of breath (AS NEEDED FOR SHORTNESS OF BREATH). 18 each 5   aspirin  EC 81 MG EC tablet Take 1 tablet (81 mg total) by mouth daily.     clobetasol cream (TEMOVATE) 0.05 % 1 APPLICATION EXTERNALLY TWICE A DAY 30 DAYS     diazepam  (VALIUM ) 5 MG tablet Take 1 tablet (5 mg total) by mouth daily as needed. (Patient taking differently: Take 5 mg by mouth daily as needed. For MRI and Flying only per pt) 30 tablet 0   diphenoxylate -atropine  (LOMOTIL ) 2.5-0.025 MG tablet TAKE 1 TABLET BY MOUTH TWICE A DAY 180 tablet 0   dutasteride  (AVODART ) 0.5 MG capsule Take 0.5 mg by mouth daily.     fluticasone  (FLONASE ) 50 MCG/ACT nasal spray Place 2 sprays into both nostrils daily. 16 g 11   glucosamine-chondroitin 500-400 MG tablet Take 3 tablets by mouth daily. (Patient taking differently: Take 2 tablets by mouth daily.)     hydrochlorothiazide  (MICROZIDE ) 12.5 MG capsule Take 1 capsule (12.5 mg total) by mouth daily. 90 capsule 3   losartan  (COZAAR ) 100 MG tablet TAKE 1 TABLET BY MOUTH EVERY DAY 90 tablet 1   methocarbamol  (ROBAXIN ) 500 MG tablet Take 1 tablet (500 mg total) by mouth every 8 (eight) hours as needed for muscle spasms. 30 tablet 5   rosuvastatin  (CRESTOR ) 5 MG tablet TAKE 1 TABLET (5 MG TOTAL) BY MOUTH DAILY. 90 tablet  1   tamsulosin  (FLOMAX ) 0.4 MG CAPS capsule Take 0.4 mg by mouth daily.     TRELEGY ELLIPTA  100-62.5-25 MCG/ACT AEPB INHALE 1 PUFF BY MOUTH EVERY DAY 60 each 5   fluorouracil (EFUDEX) 5 % cream Apply 1 Application topically 2 (two)  times daily. (Patient not taking: Reported on 12/23/2023)     No current facility-administered medications for this visit.   No Known Allergies   Review of Systems: All systems reviewed and negative except where noted in HPI.   Lab Results  Component Value Date   WBC 10.8 (H) 12/23/2023   HGB 14.8 12/23/2023   HCT 43.8 12/23/2023   MCV 90.9 12/23/2023   PLT 229.0 12/23/2023    Lab Results  Component Value Date   ALT 22 12/23/2023   AST 18 12/23/2023   ALKPHOS 59 12/23/2023   BILITOT 0.5 12/23/2023    Lab Results  Component Value Date   INR 1.0 06/24/2022   INR 1.0 10/24/2021   INR 1.0 09/23/2021     Physical Exam: BP 124/72   Pulse 73   Ht 5\' 10"  (1.778 m)   Wt 177 lb 6 oz (80.5 kg)   SpO2 97%   BMI 25.45 kg/m  Constitutional: Pleasant,well-developed, male in no acute distress. Neurological: Alert and oriented to person place and time. Psychiatric: Normal mood and affect. Behavior is normal.   ASSESSMENT: 78 y.o. male here for assessment of the following  1. Hepatocellular carcinoma (HCC)   2. Cirrhosis of liver without ascites, unspecified hepatic cirrhosis type (HCC)   3. Irritable bowel syndrome with diarrhea    Patient has compensated cirrhosis from HCV/EtOH and developed HCC in recent years.  He is followed by Dr. Leighton Punches, following resection, surveillance MRI has not shown any recurrence of overt disease.  Due for follow-up MRI in September, coordinated by Dr. Leighton Punches.  Otherwise his platelets are normal, he has no evidence of varices on his MRI, I think okay to hold off on EGD for now as long as his platelets are normal.  His liver disease is well compensated.  Will trend labs every 6 months.  Otherwise discussed his IBS-D.  He  had a flare of this in February, perhaps had a self-limited virus, tested negative for C. difficile.  Aside of that he is very pleased with his regimen of Lomotil .  We discussed other options and if he wanted to try something else and at this time he does not as he is pleased with Lomotil  and allows him to function and tolerates it.  Follow-up in 6 months or sooner with issues  PLAN: - MRI Sept - Dr. Leighton Punches - cirrhosis otherwise compensated - labs every 6 months.  - refill lomotil , declines other options for IBS for now - follow up in 6 months or sooner with issues  Christi Coward, MD [facility]

## 2023-12-24 ENCOUNTER — Encounter: Payer: Self-pay | Admitting: Family Medicine

## 2023-12-24 NOTE — Telephone Encounter (Signed)
 Pt scheduled virtual visit.

## 2023-12-24 NOTE — Telephone Encounter (Signed)
 Please schedule a visit.

## 2023-12-25 ENCOUNTER — Telehealth (INDEPENDENT_AMBULATORY_CARE_PROVIDER_SITE_OTHER): Admitting: Family Medicine

## 2023-12-25 ENCOUNTER — Encounter: Payer: Self-pay | Admitting: Family Medicine

## 2023-12-25 ENCOUNTER — Ambulatory Visit: Payer: Self-pay | Admitting: Family Medicine

## 2023-12-25 ENCOUNTER — Encounter: Payer: Self-pay | Admitting: Gastroenterology

## 2023-12-25 VITALS — Ht 70.0 in | Wt 177.0 lb

## 2023-12-25 DIAGNOSIS — I10 Essential (primary) hypertension: Secondary | ICD-10-CM

## 2023-12-25 DIAGNOSIS — M1990 Unspecified osteoarthritis, unspecified site: Secondary | ICD-10-CM

## 2023-12-25 DIAGNOSIS — D729 Disorder of white blood cells, unspecified: Secondary | ICD-10-CM

## 2023-12-25 DIAGNOSIS — M353 Polymyalgia rheumatica: Secondary | ICD-10-CM

## 2023-12-25 LAB — C-REACTIVE PROTEIN: CRP: 1.9 mg/dL (ref 0.5–20.0)

## 2023-12-25 LAB — CK: Total CK: 71 U/L (ref 7–232)

## 2023-12-25 LAB — SEDIMENTATION RATE: Sed Rate: 34 mm/h — ABNORMAL HIGH (ref 0–20)

## 2023-12-25 MED ORDER — PREDNISONE 20 MG PO TABS
20.0000 mg | ORAL_TABLET | Freq: Every day | ORAL | 0 refills | Status: AC
Start: 2023-12-25 — End: ?

## 2023-12-25 NOTE — Telephone Encounter (Signed)
**Note De-identified  [place] Obfuscation** Please advise 

## 2023-12-25 NOTE — Progress Notes (Signed)
 His labs show that he is still slightly dehydrated.  Is he trying to stay well hydrated?  His white blood cell count is still slightly elevated but better than where it was a couple of weeks ago. We can discuss more at his upcoming visit however I would recommend we recheck again in a couple of weeks.

## 2023-12-25 NOTE — Telephone Encounter (Signed)
 Please see result note. We can discuss more at upcoming visit.  Jinny Mounts. Daneil Dunker, MD 12/25/2023 10:20 AM

## 2023-12-25 NOTE — Assessment & Plan Note (Signed)
 Stable on HCTZ 12.5 mg daily and losartan  100 mg daily.  Did have mildly elevated BUN on recent labs though he is trying to increase his fluid intake.  We can recheck again in a few weeks.

## 2023-12-25 NOTE — Assessment & Plan Note (Signed)
 May be contributing to his above muscular pain however his symptoms seem to be mostly confined to musculature at this point.  If above workup is negative and symptoms persist would consider having him follow back up with orthopedics.

## 2023-12-25 NOTE — Progress Notes (Signed)
   George Watson is a 78 y.o. male who presents today for a virtual office visit.  Assessment/Plan:  New/Acute Problems: Myalgia/malaise Unclear etiology.  Discussed limitations of virtual visit and inability perform physical exam.  Lab workup thus far has been only notable for mild leukocytosis.  Due to the degree of his muscular pain, there is some concern for potential underlying inflammatory condition such as polymyalgia.  Low suspicion for infection at this point given his lack of other symptoms or any other localizing symptoms.  Patient will come back into the office later today for labs including CRP, sed rate, CK, ANA, and rheumatoid factor.  His TSH was negative a couple of weeks ago.  We will also empirically start prednisone  burst while we await above workup.  He will come in to get labs before he starts his prednisone  burst.  Depending on response to prednisone  and above labs may need referral to rheumatology.  We discussed reasons to return to care and seek emergent care.  Chronic Problems Addressed Today: Essential hypertension Stable on HCTZ 12.5 mg daily and losartan  100 mg daily.  Did have mildly elevated BUN on recent labs though he is trying to increase his fluid intake.  We can recheck again in a few weeks.  Osteoarthritis May be contributing to his above muscular pain however his symptoms seem to be mostly confined to musculature at this point.  If above workup is negative and symptoms persist would consider having him follow back up with orthopedics.     Subjective:  HPI:  See A/P for chronic conditions.  Patient is here today for follow-up.  Last saw him a couple weeks ago.  At that time he was having ongoing issues with fatigue.  We did check labs at that time which showed leukocytosis with WBC of 11.  7.  He was asked to come back in a couple weeks to recheck.  He had these done earlier this week which showed persistent WBC of 10.8.  Also slightly elevated BUN at 26.   Patient is still having significant issues with ongoing fatigue, malaise, and muscle pain.  This has been going on for several weeks.  He has been on prednisone  in the past for COPD and does note that this has helped significantly with muscle pains in the past.  Denies any fevers or chills.  No unintentional weight loss.  No sinus pressure or rhinorrhea.  No cough.  No sneeze.  No wheezing or shortness of breath.  No urinary frequency or dysuria.  No reported nausea or vomiting.  No rashes.  No skin lesions.  No diarrhea.       Objective/Observations  Physical Exam: Gen: NAD, resting comfortably Pulm: Normal work of breathing Neuro: Grossly normal, moves all extremities Psych: Normal affect and thought content  Virtual Visit via Video   I connected with Hetty Loth on 12/25/23 at 11:40 AM EDT by a video enabled telemedicine application and verified that I am speaking with the correct person using two identifiers. The limitations of evaluation and management by telemedicine and the availability of in person appointments were discussed. The patient expressed understanding and agreed to proceed.   Patient location: Home Provider location: [place] [employer] Persons participating in the virtual visit: Myself and Patient     Jinny Mounts. Daneil Dunker, MD 12/25/2023 12:48 PM

## 2023-12-26 LAB — RHEUMATOID FACTOR: Rheumatoid fact SerPl-aCnc: <10 [IU]/mL (ref <14)

## 2023-12-27 LAB — ANA COMPREHENSIVE PANEL
Anti JO-1: <0.2 AI (ref 0.0–0.9)
Centromere Ab Screen: <0.2 AI (ref 0.0–0.9)
Chromatin Ab SerPl-aCnc: <0.2 AI (ref 0.0–0.9)
ENA RNP Ab: <0.2 AI (ref 0.0–0.9)
ENA SM Ab Ser-aCnc: <0.2 AI (ref 0.0–0.9)
ENA SSA (RO) Ab: <0.2 AI (ref 0.0–0.9)
ENA SSB (LA) Ab: <0.2 AI (ref 0.0–0.9)
Scleroderma (Scl-70) (ENA) Antibody, IgG: <0.2 AI (ref 0.0–0.9)
dsDNA Ab: <1 [IU]/mL (ref 0–9)

## 2023-12-28 ENCOUNTER — Ambulatory Visit: Payer: Self-pay | Admitting: Family Medicine

## 2023-12-28 DIAGNOSIS — L820 Inflamed seborrheic keratosis: Secondary | ICD-10-CM | POA: Diagnosis not present

## 2023-12-28 DIAGNOSIS — L720 Epidermal cyst: Secondary | ICD-10-CM | POA: Diagnosis not present

## 2023-12-28 DIAGNOSIS — D485 Neoplasm of uncertain behavior of skin: Secondary | ICD-10-CM | POA: Diagnosis not present

## 2023-12-28 NOTE — Telephone Encounter (Signed)
 See note

## 2023-12-28 NOTE — Progress Notes (Signed)
 One of his inflammatory markers was moderately elevated.  As we discussed at his most recent visit this indicates underlying inflammation.  All of his other autoimmune tests including rheumatoid are negative.  He should let us  know how he is doing with prednisone  that we sent in last week.

## 2023-12-29 NOTE — Telephone Encounter (Signed)
 Please schedule an office visit with Dr Jimmey Ralph

## 2023-12-29 NOTE — Telephone Encounter (Signed)
 As we discussed at his office visit, I am concerned that he may have polymyalgia rheumatica.  Unless he has had a change in symptoms, he does not have any localizing signs of infection.    I recommend that he finish his course of prednisone  before we make any other changes in treatment plan however he should let us  know if he has developed any new signs or symptoms such as cough, urinary frequency, diarrhea, etc.

## 2024-01-05 ENCOUNTER — Ambulatory Visit (INDEPENDENT_AMBULATORY_CARE_PROVIDER_SITE_OTHER): Admitting: Family Medicine

## 2024-01-05 ENCOUNTER — Encounter: Payer: Self-pay | Admitting: Family Medicine

## 2024-01-05 VITALS — BP 148/55 | HR 71 | Temp 97.7°F | Ht 70.0 in | Wt 177.0 lb

## 2024-01-05 DIAGNOSIS — J449 Chronic obstructive pulmonary disease, unspecified: Secondary | ICD-10-CM | POA: Diagnosis not present

## 2024-01-05 DIAGNOSIS — M353 Polymyalgia rheumatica: Secondary | ICD-10-CM | POA: Insufficient documentation

## 2024-01-05 DIAGNOSIS — I10 Essential (primary) hypertension: Secondary | ICD-10-CM | POA: Diagnosis not present

## 2024-01-05 DIAGNOSIS — R700 Elevated erythrocyte sedimentation rate: Secondary | ICD-10-CM | POA: Diagnosis not present

## 2024-01-05 MED ORDER — PREDNISONE 10 MG PO TABS: 10.0000 mg | ORAL_TABLET | Freq: Every day | ORAL | 0 refills | Status: DC

## 2024-01-05 NOTE — Assessment & Plan Note (Signed)
 Blood pressure at goal today on HCTZ 12.5 mg daily and losartan  100 mg daily.  Recheck labs in a few weeks.

## 2024-01-05 NOTE — Assessment & Plan Note (Signed)
 Stable.  No recent flares.  On Trelegy and albuterol  per pulmonology.

## 2024-01-05 NOTE — Patient Instructions (Signed)
 It was very nice to see you today!  I think you probably have polymyalgia rheumatica.  Please continue the prednisone  10 mg daily.  I will refer you to see the rheumatologist.  Please come back in 4 to 6 weeks to recheck blood work.  Return in about 4 weeks (around 02/02/2024) for labs.   Take care, Dr Daneil Dunker  PLEASE NOTE:  If you had any lab tests, please let us  know if you have not heard back within a few days. You may see your results on mychart before we have a chance to review them but we will give you a call once they are reviewed by us .   If we ordered any referrals today, please let us  know if you have not heard from their office within the next week.   If you had any urgent prescriptions sent in today, please check with the pharmacy within an hour of our visit to make sure the prescription was transmitted appropriately.   Please try these tips to maintain a healthy lifestyle:  Eat at least 3 REAL meals and 1-2 snacks per day.  Aim for no more than 5 hours between eating.  If you eat breakfast, please do so within one hour of getting up.   Each meal should contain half fruits/vegetables, one quarter protein, and one quarter carbs (no bigger than a computer mouse)  Cut down on sweet beverages. This includes juice, soda, and sweet tea.   Drink at least 1 glass of water with each meal and aim for at least 8 glasses per day  Exercise at least 150 minutes every week.

## 2024-01-05 NOTE — Assessment & Plan Note (Signed)
 High degree suspicion for PMR given his dramatic improvement on prednisone  and elevated sed rate on recent labs.  He tolerated prednisone  10 mg daily very well without any significant side effects.  We will continue prednisone  10 mg daily and recheck his labs in a few weeks.  We will refer to rheumatology for further evaluation and management.

## 2024-01-05 NOTE — Progress Notes (Signed)
   George Watson is a 78 y.o. male who presents today for an office visit.  Assessment/Plan:  Chronic Problems Addressed Today: Polymyalgia (HCC) High degree suspicion for PMR given his dramatic improvement on prednisone  and elevated sed rate on recent labs.  He tolerated prednisone  10 mg daily very well without any significant side effects.  We will continue prednisone  10 mg daily and recheck his labs in a few weeks.  We will refer to rheumatology for further evaluation and management.  COPD (chronic obstructive pulmonary disease) Stable.  No recent flares.  On Trelegy and albuterol  per pulmonology.  Essential hypertension Blood pressure at goal today on HCTZ 12.5 mg daily and losartan  100 mg daily.  Recheck labs in a few weeks.     Subjective:  HPI:  See A/P for status of chronic conditions.  Patient is here today for follow-up.  I last saw him 11 days ago via virtual visit.  His primary concern at that point was ongoing myalgias and generalized malaise.  Additional symptoms included fatigue and overall stiffness.  Did not have any other localizing symptoms at that point.  No headache, fevers, chills, cough, congestion, nausea, vomiting, diarrhea, constipation, rashes, etc.    We checked labs showed mildly elevated sed rate at 34.  His other labs including rheumatoid factor, ANA, CK, and CRP were normal.   We started prednisone  burst 20 mg daily empirically for potential PMR.  He noticed dramatic improvement in his symptoms starting on day 2. His muscular pain and fatigue improved significantly however he felt like the dose was too strong due to excessive energy and decrease the dose on his own to 10 mg daily.  He did this for a week or so and symptoms had essentially resolved. He was able to perform his previous levels of exercise and routine and commented to his wife that he felt normal.   He stopped the prednisone  a couple of days ago due to running out of prescription and has noticed  some of his symptoms are starting to return including muscle aches and malaise.  He has not developed any other new symptoms since our last visit.       Objective:  Physical Exam: BP (!) 148/55   Pulse 71   Temp 97.7 F (36.5 C) (Temporal)   Ht 5' 10 (1.778 m)   Wt 177 lb (80.3 kg)   SpO2 96%   BMI 25.40 kg/m   Gen: No acute distress, resting comfortably CV: Regular rate and rhythm with no murmurs appreciated Pulm: Normal work of breathing, clear to auscultation bilaterally with no crackles, wheezes, or rhonchi Neuro: Grossly normal, moves all extremities Psych: Normal affect and thought content      Darey Hershberger M. Daneil Dunker, MD 01/05/2024 8:44 AM

## 2024-01-05 NOTE — Telephone Encounter (Signed)
 See note

## 2024-01-06 NOTE — Telephone Encounter (Signed)
 His dose of prednisone  is not high enough to cause a suppressed immune system. He did the right thing by washing his hands immediately. He does not need any prophylactic antibiotics however he should let us  know if he develops any symptoms such as fevers, chills, diarrhea, cough, etc.  George Watson M. Daneil Dunker, MD 01/06/2024 7:30 AM

## 2024-01-09 ENCOUNTER — Other Ambulatory Visit: Payer: Self-pay | Admitting: Adult Health

## 2024-01-11 ENCOUNTER — Encounter: Payer: Self-pay | Admitting: Emergency Medicine

## 2024-01-11 MED ORDER — TRELEGY ELLIPTA 100-62.5-25 MCG/ACT IN AEPB: 1.0000 | INHALATION_SPRAY | Freq: Every day | RESPIRATORY_TRACT | 5 refills | Status: DC

## 2024-01-12 ENCOUNTER — Encounter: Payer: Self-pay | Admitting: Family Medicine

## 2024-01-12 ENCOUNTER — Other Ambulatory Visit: Payer: Self-pay | Admitting: *Deleted

## 2024-01-12 MED ORDER — PREDNISONE 10 MG PO TABS: 10.0000 mg | ORAL_TABLET | Freq: Every day | ORAL | 1 refills | Status: DC

## 2024-01-12 NOTE — Telephone Encounter (Signed)
**Note De-identified  [place] Obfuscation** Please advise 

## 2024-01-12 NOTE — Telephone Encounter (Signed)
 Ok to send in 30 day supply of his prednisone  10 mg daily with 1 refill.   Worth HERO. Kennyth, MD 01/12/2024 11:28 AM

## 2024-01-14 ENCOUNTER — Encounter: Payer: Self-pay | Admitting: Family Medicine

## 2024-01-14 DIAGNOSIS — M9901 Segmental and somatic dysfunction of cervical region: Secondary | ICD-10-CM | POA: Diagnosis not present

## 2024-01-14 DIAGNOSIS — M9903 Segmental and somatic dysfunction of lumbar region: Secondary | ICD-10-CM | POA: Diagnosis not present

## 2024-01-14 DIAGNOSIS — M47812 Spondylosis without myelopathy or radiculopathy, cervical region: Secondary | ICD-10-CM | POA: Diagnosis not present

## 2024-01-14 DIAGNOSIS — S29012A Strain of muscle and tendon of back wall of thorax, initial encounter: Secondary | ICD-10-CM | POA: Diagnosis not present

## 2024-01-14 DIAGNOSIS — M47817 Spondylosis without myelopathy or radiculopathy, lumbosacral region: Secondary | ICD-10-CM | POA: Diagnosis not present

## 2024-01-14 DIAGNOSIS — M9902 Segmental and somatic dysfunction of thoracic region: Secondary | ICD-10-CM | POA: Diagnosis not present

## 2024-01-14 DIAGNOSIS — M5386 Other specified dorsopathies, lumbar region: Secondary | ICD-10-CM | POA: Diagnosis not present

## 2024-02-08 ENCOUNTER — Other Ambulatory Visit (INDEPENDENT_AMBULATORY_CARE_PROVIDER_SITE_OTHER)

## 2024-02-08 DIAGNOSIS — M353 Polymyalgia rheumatica: Secondary | ICD-10-CM

## 2024-02-08 DIAGNOSIS — R700 Elevated erythrocyte sedimentation rate: Secondary | ICD-10-CM | POA: Diagnosis not present

## 2024-02-08 LAB — CBC WITH DIFFERENTIAL/PLATELET
Basophils Absolute: 0 K/uL (ref 0.0–0.1)
Basophils Relative: 0.4 % (ref 0.0–3.0)
Eosinophils Absolute: 0.1 K/uL (ref 0.0–0.7)
Eosinophils Relative: 0.6 % (ref 0.0–5.0)
HCT: 46.3 % (ref 39.0–52.0)
Hemoglobin: 15.2 g/dL (ref 13.0–17.0)
Lymphocytes Relative: 16.9 % (ref 12.0–46.0)
Lymphs Abs: 1.9 K/uL (ref 0.7–4.0)
MCHC: 32.9 g/dL (ref 30.0–36.0)
MCV: 91.9 fl (ref 78.0–100.0)
Monocytes Absolute: 1 K/uL (ref 0.1–1.0)
Monocytes Relative: 9.5 % (ref 3.0–12.0)
Neutro Abs: 8 K/uL — ABNORMAL HIGH (ref 1.4–7.7)
Neutrophils Relative %: 72.6 % (ref 43.0–77.0)
Platelets: 248 K/uL (ref 150.0–400.0)
RBC: 5.04 Mil/uL (ref 4.22–5.81)
RDW: 14 % (ref 11.5–15.5)
WBC: 11.1 K/uL — ABNORMAL HIGH (ref 4.0–10.5)

## 2024-02-08 LAB — COMPREHENSIVE METABOLIC PANEL WITH GFR
ALT: 27 U/L (ref 0–53)
AST: 19 U/L (ref 0–37)
Albumin: 4.5 g/dL (ref 3.5–5.2)
Alkaline Phosphatase: 61 U/L (ref 39–117)
BUN: 26 mg/dL — ABNORMAL HIGH (ref 6–23)
CO2: 28 meq/L (ref 19–32)
Calcium: 10.1 mg/dL (ref 8.4–10.5)
Chloride: 102 meq/L (ref 96–112)
Creatinine, Ser: 0.89 mg/dL (ref 0.40–1.50)
GFR: 83.06 mL/min (ref >60.00)
Glucose, Bld: 145 mg/dL — ABNORMAL HIGH (ref 70–99)
Potassium: 4.5 meq/L (ref 3.5–5.1)
Sodium: 137 meq/L (ref 135–145)
Total Bilirubin: 0.7 mg/dL (ref 0.2–1.2)
Total Protein: 7.1 g/dL (ref 6.0–8.3)

## 2024-02-08 LAB — SEDIMENTATION RATE: Sed Rate: 9 mm/h (ref 0–20)

## 2024-02-08 LAB — C-REACTIVE PROTEIN: CRP: <1 mg/dL (ref 0.5–20.0)

## 2024-02-09 ENCOUNTER — Encounter: Payer: Self-pay | Admitting: Family Medicine

## 2024-02-09 NOTE — Telephone Encounter (Signed)
 Please schedule an office visit.

## 2024-02-10 ENCOUNTER — Ambulatory Visit: Payer: Self-pay | Admitting: Family Medicine

## 2024-02-10 NOTE — Progress Notes (Signed)
 His white blood cell count is mildly elevated however this is due to the prednisone .  He also has had a slight increase in his blood sugar which may be due to the prednisone  as well.  His sedimentation rate, which is the inflammatory marker that we are monitoring, is much better compared to last month.  Do not need to make any changes to treatment plan at this time.  He should let us  know if his symptoms are still controlled with the prednisone .  We will defer further management to rheumatologist-can we make sure that he has an appointment with them scheduled soon?

## 2024-02-11 ENCOUNTER — Other Ambulatory Visit: Payer: Self-pay | Admitting: *Deleted

## 2024-02-11 ENCOUNTER — Telehealth: Payer: Self-pay | Admitting: Family Medicine

## 2024-02-11 ENCOUNTER — Ambulatory Visit (INDEPENDENT_AMBULATORY_CARE_PROVIDER_SITE_OTHER): Admitting: Family Medicine

## 2024-02-11 ENCOUNTER — Encounter: Payer: Self-pay | Admitting: Family Medicine

## 2024-02-11 VITALS — BP 149/78 | HR 69 | Temp 98.4°F | Ht 70.0 in | Wt 174.6 lb

## 2024-02-11 DIAGNOSIS — J449 Chronic obstructive pulmonary disease, unspecified: Secondary | ICD-10-CM

## 2024-02-11 DIAGNOSIS — M353 Polymyalgia rheumatica: Secondary | ICD-10-CM | POA: Diagnosis not present

## 2024-02-11 DIAGNOSIS — I10 Essential (primary) hypertension: Secondary | ICD-10-CM | POA: Diagnosis not present

## 2024-02-11 MED ORDER — PREDNISONE 20 MG PO TABS: 40.0000 mg | ORAL_TABLET | Freq: Every day | ORAL | 0 refills | Status: DC

## 2024-02-11 MED ORDER — DOXYCYCLINE HYCLATE 100 MG PO TABS: 100.0000 mg | ORAL_TABLET | Freq: Two times a day (BID) | ORAL | 0 refills | Status: DC

## 2024-02-11 MED ORDER — PREDNISONE 20 MG PO TABS: 40.0000 mg | ORAL_TABLET | Freq: Every day | ORAL | 0 refills | Status: AC

## 2024-02-11 NOTE — Telephone Encounter (Unsigned)
 Copied from CRM [id number]. Topic: Clinical - Prescription Issue >> Feb 11, 2024  2:42 PM Gennette ORN wrote: Reason for CRM:  Patient stated he is on prednisone  it's suppose to be 40 ML each day. The prescription states otherwise.

## 2024-02-11 NOTE — Assessment & Plan Note (Signed)
 Patient with flare of his COPD.  Overall no red flags or signs of respiratory distress.  Will treat with doxycycline .  He has done well with this in the past.  He is currently on prednisone  for presumed PMR however we will give prednisone  burst 40 mg daily for the next 5 days due to his COPD flare.  He will continue with his Trelegy and albuterol  per pulmonology.  We discussed reasons to return to care.

## 2024-02-11 NOTE — Progress Notes (Signed)
   George Watson is a 78 y.o. male who presents today for an office visit.  Assessment/Plan:  Chronic Problems Addressed Today: COPD (chronic obstructive pulmonary disease) Patient with flare of his COPD.  Overall no red flags or signs of respiratory distress.  Will treat with doxycycline .  He has done well with this in the past.  He is currently on prednisone  for presumed PMR however we will give prednisone  burst 40 mg daily for the next 5 days due to his COPD flare.  He will continue with his Trelegy and albuterol  per pulmonology.  We discussed reasons to return to care.  Polymyalgia (HCC) Symptoms continue to be well-controlled on prednisone  10 mg daily.  Sed rate improved to 9 from 34 on recent labs.  He will follow-up with rheumatology next week.  Essential hypertension Mildly elevated today.  Typically well-controlled on HCTZ 12.5 mg daily and losartan  100 mg daily.  He will monitor at home and let us  know if persistently elevated.     Subjective:  HPI:  See Assessment / plan for status of chronic conditions. HE is here today with concern for flare up. This flared up a few days ago. He is having some wheezing. No fevers or chills.  No cough.  Some slightly increased shortness of breath.  I last saw him about a month ago.  At that time we had high degree of suspicion for PMR due to dramatic improvement on prednisone .  He is still doing well with this and is now on 10 mg daily.  We did check a sed rate earlier this week which showed improvement from 34 down to 9.  He has been tolerating well.  Also recently found out that his brother was diagnosed with PMR a few years ago.       Objective:  Physical Exam: BP (!) 149/78   Pulse 69   Temp 98.4 F (36.9 C) (Temporal)   Ht 5' 10 (1.778 m)   Wt 174 lb 9.6 oz (79.2 kg)   SpO2 96%   BMI 25.05 kg/m   Gen: No acute distress, resting comfortably CV: Regular rate and rhythm with no murmurs appreciated Pulm: Normal work of breathing,  speaking in full sentences.  Diffuse rhonchi and wheezes noted throughout. Neuro: Grossly normal, moves all extremities Psych: Normal affect and thought content      Jennesis Ramaswamy M. Kennyth, MD 02/11/2024 12:33 PM

## 2024-02-11 NOTE — Assessment & Plan Note (Signed)
 Symptoms continue to be well-controlled on prednisone  10 mg daily.  Sed rate improved to 9 from 34 on recent labs.  He will follow-up with rheumatology next week.

## 2024-02-11 NOTE — Telephone Encounter (Signed)
 See previews note

## 2024-02-11 NOTE — Patient Instructions (Signed)
 It was very nice to see you today!  I think you have a COPD flare.  Please start the prednisone  burst and doxycycline .  Let us  know if not proving.  Return if symptoms worsen or fail to improve.   Take care, Dr Kennyth  PLEASE NOTE:  If you had any lab tests, please let us  know if you have not heard back within a few days. You may see your results on mychart before we have a chance to review them but we will give you a call once they are reviewed by us .   If we ordered any referrals today, please let us  know if you have not heard from their office within the next week.   If you had any urgent prescriptions sent in today, please check with the pharmacy within an hour of our visit to make sure the prescription was transmitted appropriately.   Please try these tips to maintain a healthy lifestyle:  Eat at least 3 REAL meals and 1-2 snacks per day.  Aim for no more than 5 hours between eating.  If you eat breakfast, please do so within one hour of getting up.   Each meal should contain half fruits/vegetables, one quarter protein, and one quarter carbs (no bigger than a computer mouse)  Cut down on sweet beverages. This includes juice, soda, and sweet tea.   Drink at least 1 glass of water with each meal and aim for at least 8 glasses per day  Exercise at least 150 minutes every week.

## 2024-02-11 NOTE — Assessment & Plan Note (Signed)
 Mildly elevated today.  Typically well-controlled on HCTZ 12.5 mg daily and losartan  100 mg daily.  He will monitor at home and let us  know if persistently elevated.

## 2024-02-16 ENCOUNTER — Encounter: Payer: Self-pay | Admitting: Gastroenterology

## 2024-02-16 ENCOUNTER — Other Ambulatory Visit: Payer: Self-pay

## 2024-02-16 MED ORDER — DIPHENOXYLATE-ATROPINE 2.5-0.025 MG PO TABS
1.0000 | ORAL_TABLET | Freq: Two times a day (BID) | ORAL | 1 refills | Status: DC
Start: 2024-02-16 — End: 2024-02-17

## 2024-02-16 NOTE — Progress Notes (Signed)
 Patient seen in June 2025.  Dr. Leigh indicated he was to continue Lomotil  .  Script sent to [facility]. Patient needs a 6 month appointment in December for additional refills.

## 2024-02-17 ENCOUNTER — Other Ambulatory Visit: Payer: Self-pay

## 2024-02-17 DIAGNOSIS — M25551 Pain in right hip: Secondary | ICD-10-CM | POA: Diagnosis not present

## 2024-02-17 DIAGNOSIS — Z7952 Long term (current) use of systemic steroids: Secondary | ICD-10-CM | POA: Diagnosis not present

## 2024-02-17 DIAGNOSIS — M25552 Pain in left hip: Secondary | ICD-10-CM | POA: Diagnosis not present

## 2024-02-17 DIAGNOSIS — M25512 Pain in left shoulder: Secondary | ICD-10-CM | POA: Diagnosis not present

## 2024-02-17 DIAGNOSIS — R700 Elevated erythrocyte sedimentation rate: Secondary | ICD-10-CM | POA: Diagnosis not present

## 2024-02-17 DIAGNOSIS — M2560 Stiffness of unspecified joint, not elsewhere classified: Secondary | ICD-10-CM | POA: Diagnosis not present

## 2024-02-17 DIAGNOSIS — M353 Polymyalgia rheumatica: Secondary | ICD-10-CM | POA: Diagnosis not present

## 2024-02-17 DIAGNOSIS — M25511 Pain in right shoulder: Secondary | ICD-10-CM | POA: Diagnosis not present

## 2024-02-17 DIAGNOSIS — R5383 Other fatigue: Secondary | ICD-10-CM | POA: Diagnosis not present

## 2024-02-17 DIAGNOSIS — M8589 Other specified disorders of bone density and structure, multiple sites: Secondary | ICD-10-CM | POA: Diagnosis not present

## 2024-02-17 DIAGNOSIS — M549 Dorsalgia, unspecified: Secondary | ICD-10-CM | POA: Diagnosis not present

## 2024-02-17 DIAGNOSIS — M2550 Pain in unspecified joint: Secondary | ICD-10-CM | POA: Diagnosis not present

## 2024-02-17 LAB — HM HEPATITIS C SCREENING LAB
HM Hepatitis Screen: NEGATIVE
HM Hepatitis Screen: NEGATIVE

## 2024-02-17 LAB — HEPATITIS B SURFACE ANTIGEN: Hepatitis B Surface Ag: NEGATIVE

## 2024-02-17 MED ORDER — DIPHENOXYLATE-ATROPINE 2.5-0.025 MG PO TABS: 1.0000 | ORAL_TABLET | Freq: Three times a day (TID) | ORAL | 4 refills | Status: DC | PRN

## 2024-02-17 NOTE — Progress Notes (Signed)
 Changed prescription to TID prn per Dr. Leigh and faxed to [facility].

## 2024-02-19 DIAGNOSIS — S29012A Strain of muscle and tendon of back wall of thorax, initial encounter: Secondary | ICD-10-CM | POA: Diagnosis not present

## 2024-02-19 DIAGNOSIS — M9901 Segmental and somatic dysfunction of cervical region: Secondary | ICD-10-CM | POA: Diagnosis not present

## 2024-02-19 DIAGNOSIS — M47812 Spondylosis without myelopathy or radiculopathy, cervical region: Secondary | ICD-10-CM | POA: Diagnosis not present

## 2024-02-19 DIAGNOSIS — M47817 Spondylosis without myelopathy or radiculopathy, lumbosacral region: Secondary | ICD-10-CM | POA: Diagnosis not present

## 2024-02-19 DIAGNOSIS — M9902 Segmental and somatic dysfunction of thoracic region: Secondary | ICD-10-CM | POA: Diagnosis not present

## 2024-02-19 DIAGNOSIS — M9903 Segmental and somatic dysfunction of lumbar region: Secondary | ICD-10-CM | POA: Diagnosis not present

## 2024-02-20 ENCOUNTER — Encounter: Payer: Self-pay | Admitting: Emergency Medicine

## 2024-02-22 NOTE — Telephone Encounter (Signed)
**Note De-identified  [place] Obfuscation** Please advise 

## 2024-03-07 ENCOUNTER — Other Ambulatory Visit: Payer: Self-pay | Admitting: Surgery

## 2024-03-07 DIAGNOSIS — C220 Liver cell carcinoma: Secondary | ICD-10-CM

## 2024-03-08 ENCOUNTER — Ambulatory Visit (INDEPENDENT_AMBULATORY_CARE_PROVIDER_SITE_OTHER): Admitting: Family Medicine

## 2024-03-08 ENCOUNTER — Encounter: Payer: Self-pay | Admitting: Family Medicine

## 2024-03-08 VITALS — BP 127/64 | HR 76 | Temp 97.7°F | Ht 70.0 in | Wt 175.6 lb

## 2024-03-08 DIAGNOSIS — J449 Chronic obstructive pulmonary disease, unspecified: Secondary | ICD-10-CM | POA: Diagnosis not present

## 2024-03-08 DIAGNOSIS — R7303 Prediabetes: Secondary | ICD-10-CM

## 2024-03-08 DIAGNOSIS — I10 Essential (primary) hypertension: Secondary | ICD-10-CM | POA: Diagnosis not present

## 2024-03-08 DIAGNOSIS — M353 Polymyalgia rheumatica: Secondary | ICD-10-CM

## 2024-03-08 LAB — POCT GLYCOSYLATED HEMOGLOBIN (HGB A1C): Hemoglobin A1C: 6.9 % — AB (ref 4.0–5.6)

## 2024-03-08 MED ORDER — TRIAMCINOLONE ACETONIDE 0.5 % EX OINT: 1.0000 | TOPICAL_OINTMENT | Freq: Two times a day (BID) | CUTANEOUS | 0 refills | Status: DC

## 2024-03-08 NOTE — Progress Notes (Signed)
 George Watson is a 78 y.o. male who presents today for an office visit.  Assessment/Plan:  New/Acute Problems: Rash Appearance consistent with likely inflammatory reaction secondary to insect bite or sting.  Will start topical triamcinolone .  We discussed reasons to return to care.  He will let us  know if not improving.  Chronic Problems Addressed Today: Prediabetes A1c elevated to 6.9.  Elevated compared to last time likely due to being on prednisone  for his presumed PMR.  He is working on lifestyle interventions.  Will avoid starting medications at this time.  Hopefully he will continue to wean down on the prednisone .  Recheck again in 3 months.  Essential hypertension Blood pressure at goal today on HCTZ 12.5 mg daily and losartan  100 mg daily.  Polymyalgia (HCC) Now following with rheumatology for this.  Symptoms are well-controlled on prednisone  taper.  He is currently on 9 mg daily and is weaning down per rheumatology as tolerated.  COPD (chronic obstructive pulmonary disease) He previously has followed with pulmonology for this however he has doing well overall.  He would like for us  to take over prescription for his albuterol  and Trelegy.  Does not need refill today though will let us  know when he needs a refill.  Reassuring exam today.     Subjective:  HPI:  See assessment / plan for status of chronic conditions.  Discussed the use of AI scribe software for clinical note transcription with the patient, who gave verbal consent to proceed.  History of Present Illness George Watson is a 78 year old male with polymyalgia rheumatica who presents for follow-up on blood sugar levels and medication management.  He is monitoring his blood sugar levels, which have been affected by his current prednisone  treatment. His recent blood sugar level was 6.9, and he notes it is increasing. He is concerned about the impact of prednisone  on his blood sugar and is monitoring it every ninety  days.  Since our last visit he did see rheumatology and they are taking over management for his polymyalgia.  He has polymyalgia rheumatica and is on a prednisone  regimen, tapered to 9 mg daily. He has experienced improved mobility with this treatment. He has a family history of prolonged prednisone  use, as his brother was on it for two and a half years. He experiences skin thinning as a side effect of the medication. A recent bone scan was performed, and the patient was told by Dr. Curt assistant that he has strong bones.  He reports a recent insect bite, possibly from a bee, which occurred three days ago. The bite is not causing significant pain, but he experiences nocturnal itching, managed with Benadryl .  Located on left inner arm.  He has applied Voltaren and cortisone cream to the area, which provides some relief.  He is managing his weight carefully, noting fluctuations of a pound or two, and is conscious of his diet, focusing on low sugar and low carbohydrate intake. He consumes vegetables daily and fruit when available, and limits desserts to once a week.  He has a history of pulmonary issues and is currently using Trelegy and Albuterol  inhalers, with no recent changes in his condition. He is not in need of refills at this time.         Objective:  Physical Exam: BP 127/64   Pulse 76   Temp 97.7 F (36.5 C) (Temporal)   Ht 5' 10 (1.778 m)   Wt 175 lb 9.6 oz (79.7 kg)  SpO2 96%   BMI 25.20 kg/m   Wt Readings from Last 3 Encounters:  03/08/24 175 lb 9.6 oz (79.7 kg)  02/11/24 174 lb 9.6 oz (79.2 kg)  01/05/24 177 lb (80.3 kg)    Gen: No acute distress, resting comfortably CV: Regular rate and rhythm with no murmurs appreciated Pulm: Normal work of breathing, clear to auscultation bilaterally with no crackles, wheezes, or rhonchi Skin: Approximately 1 cm erythematous indurated area on left inner arm consistent with insect bite.  No surrounding erythema.  No pain on  palpation. Neuro: Grossly normal, moves all extremities Psych: Normal affect and thought content      Eluzer Howdeshell M. Kennyth, MD 03/08/2024 10:01 AM

## 2024-03-08 NOTE — Assessment & Plan Note (Signed)
 Now following with rheumatology for this.  Symptoms are well-controlled on prednisone  taper.  He is currently on 9 mg daily and is weaning down per rheumatology as tolerated.

## 2024-03-08 NOTE — Assessment & Plan Note (Signed)
 He previously has followed with pulmonology for this however he has doing well overall.  He would like for us  to take over prescription for his albuterol  and Trelegy.  Does not need refill today though will let us  know when he needs a refill.  Reassuring exam today.

## 2024-03-08 NOTE — Assessment & Plan Note (Signed)
 A1c elevated to 6.9.  Elevated compared to last time likely due to being on prednisone  for his presumed PMR.  He is working on lifestyle interventions.  Will avoid starting medications at this time.  Hopefully he will continue to wean down on the prednisone .  Recheck again in 3 months.

## 2024-03-08 NOTE — Assessment & Plan Note (Signed)
 Blood pressure at goal today on HCTZ 12.5 mg daily and losartan 100 mg daily.

## 2024-03-08 NOTE — Patient Instructions (Signed)
 It was very nice to see you today!  VISIT SUMMARY: Today, we discussed your blood sugar levels, medication management for polymyalgia rheumatica, a recent insect bite, and your COPD management.  YOUR PLAN: PREDIABETES SECONDARY TO CHRONIC PREDNISONE  THERAPY: Your blood sugar level is 6.9, which is affected by your prednisone  use. -Continue to monitor your blood sugar levels every 90 days. -Maintain a low sugar and low carbohydrate diet.  POLYMYALGIA RHEUMATICA: You are responding well to prednisone , currently tapered to 9 mg daily. -Continue taking prednisone  as directed by your rheumatologist. -Monitor your response to the 9 mg dosage and adjust as needed.  LOCALIZED INFLAMMATORY SKIN REACTION DUE TO INSECT STING: You have a localized reaction on your arm from an insect bite, with no signs of infection. -Apply triamcinolone  cream to the affected area. -Continue using Voltaren for relief. -Watch for signs of infection such as increased redness, pain, or pus.  CHRONIC OBSTRUCTIVE PULMONARY DISEASE (COPD): Your COPD is well-managed with Trelegy and Albuterol . -Continue using Trelegy and Albuterol  as prescribed. -Monitor for any changes in your symptoms and refill inhalers as needed.  Return in about 3 months (around 06/08/2024) for Follow Up.   Take care, Dr Kennyth  PLEASE NOTE:  If you had any lab tests, please let us  know if you have not heard back within a few days. You may see your results on mychart before we have a chance to review them but we will give you a call once they are reviewed by us .   If we ordered any referrals today, please let us  know if you have not heard from their office within the next week.   If you had any urgent prescriptions sent in today, please check with the pharmacy within an hour of our visit to make sure the prescription was transmitted appropriately.   Please try these tips to maintain a healthy lifestyle:  Eat at least 3 REAL meals and 1-2  snacks per day.  Aim for no more than 5 hours between eating.  If you eat breakfast, please do so within one hour of getting up.   Each meal should contain half fruits/vegetables, one quarter protein, and one quarter carbs (no bigger than a computer mouse)  Cut down on sweet beverages. This includes juice, soda, and sweet tea.   Drink at least 1 glass of water with each meal and aim for at least 8 glasses per day  Exercise at least 150 minutes every week.

## 2024-03-17 ENCOUNTER — Encounter: Payer: Self-pay | Admitting: Family Medicine

## 2024-03-17 ENCOUNTER — Ambulatory Visit: Admitting: Emergency Medicine

## 2024-03-18 DIAGNOSIS — S29012A Strain of muscle and tendon of back wall of thorax, initial encounter: Secondary | ICD-10-CM | POA: Diagnosis not present

## 2024-03-18 DIAGNOSIS — M9903 Segmental and somatic dysfunction of lumbar region: Secondary | ICD-10-CM | POA: Diagnosis not present

## 2024-03-18 DIAGNOSIS — M9901 Segmental and somatic dysfunction of cervical region: Secondary | ICD-10-CM | POA: Diagnosis not present

## 2024-03-18 DIAGNOSIS — M47812 Spondylosis without myelopathy or radiculopathy, cervical region: Secondary | ICD-10-CM | POA: Diagnosis not present

## 2024-03-18 DIAGNOSIS — M47817 Spondylosis without myelopathy or radiculopathy, lumbosacral region: Secondary | ICD-10-CM | POA: Diagnosis not present

## 2024-03-18 DIAGNOSIS — M9902 Segmental and somatic dysfunction of thoracic region: Secondary | ICD-10-CM | POA: Diagnosis not present

## 2024-03-20 ENCOUNTER — Other Ambulatory Visit (HOSPITAL_BASED_OUTPATIENT_CLINIC_OR_DEPARTMENT_OTHER): Payer: Self-pay | Admitting: Family Medicine

## 2024-03-20 DIAGNOSIS — I10 Essential (primary) hypertension: Secondary | ICD-10-CM

## 2024-03-20 DIAGNOSIS — E785 Hyperlipidemia, unspecified: Secondary | ICD-10-CM

## 2024-03-23 ENCOUNTER — Other Ambulatory Visit: Payer: Self-pay | Admitting: *Deleted

## 2024-03-23 ENCOUNTER — Encounter: Payer: Self-pay | Admitting: Family Medicine

## 2024-03-23 DIAGNOSIS — Z23 Encounter for immunization: Secondary | ICD-10-CM

## 2024-03-23 MED ORDER — COVID-19 MRNA VACC (MODERNA) 50 MCG/0.5ML IM SUSP: 0.5000 mL | Freq: Once | INTRAMUSCULAR | 0 refills | Status: AC

## 2024-03-23 NOTE — Telephone Encounter (Signed)
 Patient aware Rx Covid vaccine send to pharmacy  No need RSV, up to date  Verbalized understanding

## 2024-03-30 DIAGNOSIS — M25511 Pain in right shoulder: Secondary | ICD-10-CM | POA: Diagnosis not present

## 2024-03-30 DIAGNOSIS — M2560 Stiffness of unspecified joint, not elsewhere classified: Secondary | ICD-10-CM | POA: Diagnosis not present

## 2024-03-30 DIAGNOSIS — M353 Polymyalgia rheumatica: Secondary | ICD-10-CM | POA: Diagnosis not present

## 2024-03-30 DIAGNOSIS — R700 Elevated erythrocyte sedimentation rate: Secondary | ICD-10-CM | POA: Diagnosis not present

## 2024-03-30 DIAGNOSIS — M2550 Pain in unspecified joint: Secondary | ICD-10-CM | POA: Diagnosis not present

## 2024-03-30 DIAGNOSIS — Z23 Encounter for immunization: Secondary | ICD-10-CM | POA: Diagnosis not present

## 2024-03-30 DIAGNOSIS — M25551 Pain in right hip: Secondary | ICD-10-CM | POA: Diagnosis not present

## 2024-03-30 DIAGNOSIS — M549 Dorsalgia, unspecified: Secondary | ICD-10-CM | POA: Diagnosis not present

## 2024-03-30 DIAGNOSIS — Z7952 Long term (current) use of systemic steroids: Secondary | ICD-10-CM | POA: Diagnosis not present

## 2024-03-30 DIAGNOSIS — R5383 Other fatigue: Secondary | ICD-10-CM | POA: Diagnosis not present

## 2024-04-02 ENCOUNTER — Ambulatory Visit
Admission: RE | Admit: 2024-04-02 | Discharge: 2024-04-02 | Disposition: A | Discharge status: Home / Self Care | Source: Ambulatory Visit | Attending: Surgery | Admitting: Surgery

## 2024-04-02 DIAGNOSIS — K8689 Other specified diseases of pancreas: Secondary | ICD-10-CM | POA: Diagnosis not present

## 2024-04-02 DIAGNOSIS — C220 Liver cell carcinoma: Secondary | ICD-10-CM

## 2024-04-02 MED ORDER — GADOPICLENOL 0.5 MMOL/ML IV SOLN
7.5000 mL | Freq: Once | INTRAVENOUS | Status: AC | PRN
  Administered 2024-04-02: 7.5 mL via INTRAVENOUS

## 2024-04-04 DIAGNOSIS — K862 Cyst of pancreas: Secondary | ICD-10-CM | POA: Diagnosis not present

## 2024-04-04 DIAGNOSIS — C220 Liver cell carcinoma: Secondary | ICD-10-CM | POA: Diagnosis not present

## 2024-04-12 ENCOUNTER — Ambulatory Visit: Payer: Self-pay

## 2024-04-12 NOTE — Telephone Encounter (Signed)
 Appt tomorrow

## 2024-04-12 NOTE — Telephone Encounter (Signed)
 FYI Only or Action Required?: FYI only for provider.  Patient was last seen in primary care on 03/08/2024 by Kennyth Worth HERO, MD.  Called Nurse Triage reporting Breathing Problem.  Symptoms began several days ago.  Interventions attempted: Other: maintenance INH and albuterol  PRN.  Symptoms are: gradually worsening.  Triage Disposition: See Physician Within 24 Hours  Patient/caregiver understands and will follow disposition?: Yes        Copied from CRM #[id number]. Topic: Clinical - Red Word Triage >> Apr 12, 2024 12:37 PM Rosina D wrote: Reason for RMF:[id number] flare up/difficulty breathing and chest bronical Reason for Disposition  [1] Continuous (nonstop) coughing interferes with work or school AND [2] no improvement using cough treatment per Care Advice  Answer Assessment - Initial Assessment Questions 1. RESPIRATORY STATUS: Describe your breathing? (e.g., wheezing, shortness of breath, unable to speak, severe coughing)     Chest congestion, productive yellow cough 2. ONSET: When did this breathing problem begin?      For a few days 3. PATTERN Does the difficult breathing come and go, or has it been constant since it started?      Comes and goes 4. SEVERITY: How bad is your breathing? (e.g., mild, moderate, severe)      Mild Triager does not appreciate audible SOB/wheezing during call. Pt is speaking in full sentences. Triager reviewed/reinforced albuterol  usage and SIG.  5. RECURRENT SYMPTOM: Have you had difficulty breathing before? If Yes, ask: When was the last time? and What happened that time?      Yes, a month ago 6. CARDIAC HISTORY: Do you have any history of heart disease? (e.g., heart attack, angina, bypass surgery, angioplasty)      *No Answer* 7. LUNG HISTORY: Do you have any history of lung disease?  (e.g., pulmonary embolus, asthma, emphysema)     emphysema 8. CAUSE: What do you think is causing the breathing problem?      Flare up 9.  OTHER SYMPTOMS: Do you have any other symptoms? (e.g., chest pain, cough, dizziness, fever, runny nose)     denies 10. O2 SATURATION MONITOR:  Do you use an oxygen saturation monitor (pulse oximeter) at home? If Yes, ask: What is your reading (oxygen level) today? What is your usual oxygen saturation reading? (e.g., 95%)       94% on RA 11. PREGNANCY: Is there any chance you are pregnant? When was your last menstrual period?       N/a 12. TRAVEL: Have you traveled out of the country in the last month? (e.g., travel history, exposures)       Yes, recent air travel  Protocols used: Breathing Difficulty-A-AH, Cough - Acute Productive-A-AH

## 2024-04-13 ENCOUNTER — Encounter: Payer: Self-pay | Admitting: Physician Assistant

## 2024-04-13 ENCOUNTER — Ambulatory Visit (INDEPENDENT_AMBULATORY_CARE_PROVIDER_SITE_OTHER)

## 2024-04-13 ENCOUNTER — Ambulatory Visit: Payer: Self-pay | Admitting: Physician Assistant

## 2024-04-13 ENCOUNTER — Ambulatory Visit (INDEPENDENT_AMBULATORY_CARE_PROVIDER_SITE_OTHER): Admitting: Physician Assistant

## 2024-04-13 VITALS — BP 144/72 | HR 79 | Temp 98.3°F | Ht 70.0 in | Wt 177.2 lb

## 2024-04-13 DIAGNOSIS — R059 Cough, unspecified: Secondary | ICD-10-CM | POA: Diagnosis not present

## 2024-04-13 DIAGNOSIS — J441 Chronic obstructive pulmonary disease with (acute) exacerbation: Secondary | ICD-10-CM

## 2024-04-13 DIAGNOSIS — Z981 Arthrodesis status: Secondary | ICD-10-CM | POA: Diagnosis not present

## 2024-04-13 DIAGNOSIS — J439 Emphysema, unspecified: Secondary | ICD-10-CM | POA: Diagnosis not present

## 2024-04-13 DIAGNOSIS — R0602 Shortness of breath: Secondary | ICD-10-CM | POA: Diagnosis not present

## 2024-04-13 MED ORDER — PREDNISONE 50 MG PO TABS: ORAL_TABLET | ORAL | 0 refills | Status: DC

## 2024-04-13 MED ORDER — AMOXICILLIN-POT CLAVULANATE 875-125 MG PO TABS: 1.0000 | ORAL_TABLET | Freq: Two times a day (BID) | ORAL | 0 refills | Status: DC

## 2024-04-13 NOTE — Progress Notes (Signed)
 George Watson is a 78 y.o. male here for a recurrence of a previously resolved problem.  History of Present Illness:   Chief Complaint  Patient presents with   Cough    Pt c/o cough expectorating green sputum, wheezing, shortness of breath, started on Monday.     Cough Reports increase in cough over the past week or so Went to visit family and returned sick; no family members were sick when he was traveling Has history of emphysema that can flare - this feels the same He is taking Trelegy and Albuterol  as needed  He is also taking prednisone  10 mg daily for polymyalgia.  Reports there is no fever, severe shortness of breath, leg swelling.  Past Medical History:  Diagnosis Date   Aortic aneurysm    Arthritis    BPH (benign prostatic hypertrophy)    Cataract 2015   Cirrhosis (HCC)    COPD (chronic obstructive pulmonary disease) (HCC)    DDD (degenerative disc disease), lumbosacral    Dyspnea    Elevated PSA    Emphysema of lung (HCC)    Essential hypertension, benign    diet controlled   Fracture of shaft of clavicle    GERD (gastroesophageal reflux disease)    H/O drug abuse (HCC)    multisubstance   Hepatitis B    Hepatitis C 10/1996   History of hepatitis C 10/19/1996   Dr. Luis with [facility]. Treated in 2014. Followed yearly.      HSV-2 (herpes simplex virus 2) infection    Hx of biopsy 08/2004   liver   Hx of colonic polyps 2022   Hyperlipidemia    Inguinal hernia    right   Other and unspecified hyperlipidemia    Prostate cancer (HCC)    Substance abuse (HCC)    Tuberculosis      Social History   Tobacco Use   Smoking status: Former    Current packs/day: 0.00    Average packs/day: 1.5 packs/day for 43.0 years (64.5 ttl pk-yrs)    Types: Cigarettes    Start date: 02/22/1960    Quit date: 02/22/2003    Years since quitting: 21.1   Smokeless tobacco: Never  Vaping Use   Vaping status: Never Used  Substance Use Topics   Alcohol use: Not Currently     Comment: 1988   Drug use: Not Currently    Types: Cocaine, Heroin    Comment: 26 years ago cocaine, heroin. methadone    Past Surgical History:  Procedure Laterality Date   BACK SURGERY     CERVICAL SPINE SURGERY     EYE SURGERY  3/20   fracture ribs     3   HERNIA REPAIR     INGUINAL HERNIA REPAIR  07/01/2012   Procedure: HERNIA REPAIR INGUINAL ADULT;  Surgeon: Redell Faith, DO;  Location: [facility] ORS;  Service: General;  Laterality: Right;  with Mesh   LAPAROSCOPIC LIVER ULTRASOUND N/A 11/21/2021   Procedure: LAPAROSCOPIC LIVER ULTRASOUND;  Surgeon: Dasie Leonor CROME, MD;  Location: [facility] OR;  Service: General;  Laterality: N/A;   LAPAROSCOPIC PARTIAL HEPATECTOMY Left 11/21/2021   Procedure: LAPAROSCOPIC PARTIAL HEPATECTOMY;  Surgeon: Dasie Leonor CROME, MD;  Location: [facility] OR;  Service: General;  Laterality: Left;   LAPAROSCOPY N/A 11/21/2021   Procedure: STAGING LAPAROSCOPY;  Surgeon: Dasie Leonor CROME, MD;  Location: [facility] OR;  Service: General;  Laterality: N/A;   left knee meniscus repair  07/21/1990   right rotator cuff  07/21/1990  right shoulder arthroscopy  07/21/2009   SPINE SURGERY  10/14    Family History  Problem Relation Age of Onset   Leukemia Mother    Throat cancer Father    Esophageal cancer Father    Cancer Father        esophageal cancer   Breast cancer Sister    Cancer Sister    Prostate cancer Brother    Melanoma Brother    Colon cancer Neg Hx    Stomach cancer Neg Hx    Pancreatic cancer Neg Hx    Liver disease Neg Hx     No Known Allergies  Current Medications:   Current Outpatient Medications:    albuterol  (VENTOLIN  HFA) 108 (90 Base) MCG/ACT inhaler, Inhale 2 puffs into the lungs every 6 (six) hours as needed for wheezing or shortness of breath (AS NEEDED FOR SHORTNESS OF BREATH)., Disp: 18 each, Rfl: 5   amoxicillin -clavulanate (AUGMENTIN ) 875-125 MG tablet, Take 1 tablet by mouth 2 (two) times daily., Disp: 20 tablet, Rfl: 0   aspirin  EC 81 MG EC tablet,  Take 1 tablet (81 mg total) by mouth daily., Disp: , Rfl:    clobetasol cream (TEMOVATE) 0.05 %, 1 APPLICATION EXTERNALLY TWICE A DAY 30 DAYS, Disp: , Rfl:    diazepam  (VALIUM ) 5 MG tablet, Take 1 tablet (5 mg total) by mouth daily as needed. (Patient taking differently: Take 5 mg by mouth daily as needed. For MRI and Flying only per pt), Disp: 30 tablet, Rfl: 0   diphenoxylate -atropine  (LOMOTIL ) 2.5-0.025 MG tablet, Take 1 tablet by mouth 3 (three) times daily as needed for diarrhea or loose stools. Please schedule a F/U appointment in December for additional refills, Disp: 90 tablet, Rfl: 4   dutasteride  (AVODART ) 0.5 MG capsule, Take 0.5 mg by mouth daily., Disp: , Rfl:    fluorouracil (EFUDEX) 5 % cream, Apply 1 Application topically 2 (two) times daily., Disp: , Rfl:    fluticasone  (FLONASE ) 50 MCG/ACT nasal spray, Place 2 sprays into both nostrils daily., Disp: 16 g, Rfl: 11   glucosamine-chondroitin 500-400 MG tablet, Take 3 tablets by mouth daily. (Patient taking differently: Take 2 tablets by mouth daily.), Disp: , Rfl:    hydrochlorothiazide  (MICROZIDE ) 12.5 MG capsule, Take 1 capsule (12.5 mg total) by mouth daily., Disp: 90 capsule, Rfl: 3   losartan  (COZAAR ) 100 MG tablet, TAKE 1 TABLET BY MOUTH EVERY DAY, Disp: 90 tablet, Rfl: 1   methocarbamol  (ROBAXIN ) 500 MG tablet, Take 1 tablet (500 mg total) by mouth every 8 (eight) hours as needed for muscle spasms., Disp: 30 tablet, Rfl: 5   predniSONE  (DELTASONE ) 10 MG tablet, Take 1 tablet (10 mg total) by mouth daily with breakfast. (Patient taking differently: Take 10 mg by mouth daily with breakfast. 15 mg), Disp: 30 tablet, Rfl: 1   predniSONE  (DELTASONE ) 50 MG tablet, Take 1 tablet by mouth daily, Disp: 5 tablet, Rfl: 0   rosuvastatin  (CRESTOR ) 5 MG tablet, TAKE 1 TABLET (5 MG TOTAL) BY MOUTH DAILY., Disp: 90 tablet, Rfl: 1   tamsulosin  (FLOMAX ) 0.4 MG CAPS capsule, Take 0.4 mg by mouth daily., Disp: , Rfl:    TRELEGY ELLIPTA  100-62.5-25  MCG/ACT AEPB, INHALE 1 PUFF BY MOUTH EVERY DAY, Disp: 60 each, Rfl: 5   triamcinolone  ointment (KENALOG ) 0.5 %, Apply 1 Application topically 2 (two) times daily., Disp: 30 g, Rfl: 0   Review of Systems:   Negative unless otherwise specified per HPI.  Vitals:   Vitals:  04/13/24 1124 04/13/24 1200  BP: (!) 146/80 (!) 144/72  Pulse: 79   Temp: 98.3 F (36.8 C)   TempSrc: Temporal   SpO2: 97%   Weight: 177 lb 4 oz (80.4 kg)   Height: 5' 10 (1.778 m)      Body mass index is 25.43 kg/m.  Physical Exam:   Physical Exam Vitals and nursing note reviewed.  Constitutional:      General: He is not in acute distress.    Appearance: He is well-developed. He is not ill-appearing or toxic-appearing.  HENT:     Head: Normocephalic and atraumatic.     Right Ear: Tympanic membrane, ear canal and external ear normal. Tympanic membrane is not erythematous, retracted or bulging.     Left Ear: Tympanic membrane, ear canal and external ear normal. Tympanic membrane is not erythematous, retracted or bulging.     Nose: Nose normal.     Right Sinus: No maxillary sinus tenderness or frontal sinus tenderness.     Left Sinus: No maxillary sinus tenderness or frontal sinus tenderness.     Mouth/Throat:     Pharynx: Uvula midline. No posterior oropharyngeal erythema.  Eyes:     General: Lids are normal.     Conjunctiva/sclera: Conjunctivae normal.  Neck:     Trachea: Trachea normal.  Cardiovascular:     Rate and Rhythm: Normal rate and regular rhythm.     Heart sounds: Normal heart sounds, S1 normal and S2 normal.  Pulmonary:     Effort: Pulmonary effort is normal.     Breath sounds: Examination of the left-upper field reveals wheezing and rhonchi. Examination of the left-middle field reveals wheezing and rhonchi. Examination of the left-lower field reveals rhonchi. Wheezing and rhonchi present. No decreased breath sounds or rales.  Lymphadenopathy:     Cervical: No cervical adenopathy.   Skin:    General: Skin is warm and dry.  Neurological:     Mental Status: He is alert.  Psychiatric:        Speech: Speech normal.        Behavior: Behavior normal. Behavior is cooperative.     Assessment and Plan:   Chronic obstructive pulmonary disease with acute exacerbation (HCC) No red flags on exam Chest xray without evidence of pneumonia Start Augmentin  antibiotic Hold current daily prednisone  and add prednisone  burst of 50 mg daily x 5 days, then resume daily prednisone  Continue inhalers as prescribed If new/worsening symptom(s), advised to reach out or go to the ER    Lucie Buttner, PA-C

## 2024-04-18 DIAGNOSIS — D229 Melanocytic nevi, unspecified: Secondary | ICD-10-CM | POA: Diagnosis not present

## 2024-04-18 DIAGNOSIS — L814 Other melanin hyperpigmentation: Secondary | ICD-10-CM | POA: Diagnosis not present

## 2024-04-18 DIAGNOSIS — L570 Actinic keratosis: Secondary | ICD-10-CM | POA: Diagnosis not present

## 2024-04-18 DIAGNOSIS — D1801 Hemangioma of skin and subcutaneous tissue: Secondary | ICD-10-CM | POA: Diagnosis not present

## 2024-04-18 DIAGNOSIS — L821 Other seborrheic keratosis: Secondary | ICD-10-CM | POA: Diagnosis not present

## 2024-04-18 DIAGNOSIS — L578 Other skin changes due to chronic exposure to nonionizing radiation: Secondary | ICD-10-CM | POA: Diagnosis not present

## 2024-04-18 DIAGNOSIS — L719 Rosacea, unspecified: Secondary | ICD-10-CM | POA: Diagnosis not present

## 2024-04-19 ENCOUNTER — Other Ambulatory Visit: Payer: Self-pay | Admitting: Family Medicine

## 2024-04-21 DIAGNOSIS — C220 Liver cell carcinoma: Secondary | ICD-10-CM | POA: Diagnosis not present

## 2024-04-26 ENCOUNTER — Encounter: Payer: Self-pay | Admitting: Family Medicine

## 2024-04-28 NOTE — Telephone Encounter (Signed)
 Please schedule an office visit with any of our provider for mass on left leg

## 2024-05-03 ENCOUNTER — Ambulatory Visit (INDEPENDENT_AMBULATORY_CARE_PROVIDER_SITE_OTHER)

## 2024-05-03 ENCOUNTER — Ambulatory Visit (INDEPENDENT_AMBULATORY_CARE_PROVIDER_SITE_OTHER): Admitting: Family Medicine

## 2024-05-03 ENCOUNTER — Encounter: Payer: Self-pay | Admitting: Family Medicine

## 2024-05-03 ENCOUNTER — Ambulatory Visit: Payer: Self-pay | Admitting: Family Medicine

## 2024-05-03 VITALS — BP 128/80 | HR 66 | Temp 97.2°F | Ht 70.0 in | Wt 177.0 lb

## 2024-05-03 DIAGNOSIS — M25562 Pain in left knee: Secondary | ICD-10-CM

## 2024-05-03 DIAGNOSIS — M1712 Unilateral primary osteoarthritis, left knee: Secondary | ICD-10-CM | POA: Diagnosis not present

## 2024-05-03 DIAGNOSIS — M1990 Unspecified osteoarthritis, unspecified site: Secondary | ICD-10-CM

## 2024-05-03 DIAGNOSIS — I10 Essential (primary) hypertension: Secondary | ICD-10-CM | POA: Diagnosis not present

## 2024-05-03 DIAGNOSIS — M353 Polymyalgia rheumatica: Secondary | ICD-10-CM | POA: Diagnosis not present

## 2024-05-03 MED ORDER — MELOXICAM 15 MG PO TABS: 15.0000 mg | ORAL_TABLET | Freq: Every day | ORAL | 0 refills | Status: DC

## 2024-05-03 NOTE — Assessment & Plan Note (Signed)
 Pain in left knee consistent with flareup of osteoarthritis.  Does have a history of meniscal injury with surgery several years ago.  Will check x-ray today as this has not been performed for many years.  We also discussed conservative treatment strategies.  Will start meloxicam 15 mg daily for the next 1 to 2 weeks.  We discussed relative rest, compression, and ice as well.  He will let us  know if not improving in the next 1 to 2 weeks and would consider referral to PT or sports medicine at that time.

## 2024-05-03 NOTE — Assessment & Plan Note (Signed)
 Following with rheumatology.  He is on prednisone  taper.  Currently on 12.5 mg prednisone  though tapering to 10 mg daily soon.  Symptoms are improving.

## 2024-05-03 NOTE — Progress Notes (Signed)
 His knee x-ray shows mild arthritis in his knee.  No other acute abnormalities.  He should let us  know if his symptoms are not improving.

## 2024-05-03 NOTE — Assessment & Plan Note (Signed)
 At goal today on HCTZ 12.5 mg daily and losartan  100 mg daily.

## 2024-05-03 NOTE — Progress Notes (Signed)
   George Watson is a 78 y.o. male who presents today for an office visit.  Assessment/Plan:  Chronic Problems Addressed Today: Osteoarthritis Pain in left knee consistent with flareup of osteoarthritis.  Does have a history of meniscal injury with surgery several years ago.  Will check x-ray today as this has not been performed for many years.  We also discussed conservative treatment strategies.  Will start meloxicam 15 mg daily for the next 1 to 2 weeks.  We discussed relative rest, compression, and ice as well.  He will let us  know if not improving in the next 1 to 2 weeks and would consider referral to PT or sports medicine at that time.  Polymyalgia Following with rheumatology.  He is on prednisone  taper.  Currently on 12.5 mg prednisone  though tapering to 10 mg daily soon.  Symptoms are improving.  Essential hypertension At goal today on HCTZ 12.5 mg daily and losartan  100 mg daily.     Subjective:  HPI:  See assessment / plan for status of chronic conditions.   Discussed the use of AI scribe software for clinical note transcription with the patient, who gave verbal consent to proceed.  History of Present Illness George Watson is a 78 year old male with a history of knee surgery and polymyalgia rheumatica who presents with left knee pain and swelling.  He has been experiencing soreness and weakness in the left knee, accompanied by a noticeable lump that is not present on the right knee. The knee feels 'sore' and 'weak', and he suspects the lump may be fluid. He reports that the right knee is fine.  He underwent surgery on the left knee in 1990 or 1991 for a lateral meniscus issue. Since then, the knee has been sensitive, and over the last ten years, it has felt 'not right' but not significantly problematic until recently. The current symptoms began a few weeks ago without any known trigger. No recent trauma, bruising, or specific incident that could have caused the symptoms. Over  the last few days, he has noticed increased weakness in the knee, requiring him to favor it more.  He is currently taking prednisone  at a dose of 12.5 mg daily for polymyalgia rheumatica, which is being tapered slowly. He experiences joint pain later in the day as the prednisone  wears off, along with fatigue.  He has a history of cirrhosis and prostate and liver cancer, which limits his treatment options for polymyalgia rheumatica.         Objective:  Physical Exam: BP 128/80   Pulse 66   Temp (!) 97.2 F (36.2 C) (Temporal)   Ht 5' 10 (1.778 m)   Wt 177 lb (80.3 kg)   SpO2 95%   BMI 25.40 kg/m   Gen: No acute distress, resting comfortably MUSCULOSKELETAL: - Left Knee: Crepitus noted with active and passive range of motion.  Bony hypertrophy noted.  Lateral superior edge with subtle soft bulge.  Stable to varus and valgus stress.  Neurovascular intact distally. Neuro: Grossly normal, moves all extremities Psych: Normal affect and thought content      Kaedance Magos M. Kennyth, MD 05/03/2024 9:14 AM

## 2024-05-03 NOTE — Patient Instructions (Signed)
 It was very nice to see you today!  VISIT SUMMARY: You visited us  today due to left knee pain and swelling. We discussed your history of knee surgery and polymyalgia rheumatica, and we have developed a plan to manage your symptoms.  YOUR PLAN: LEFT KNEE OSTEOARTHRITIS WITH EFFUSION: You have chronic osteoarthritis in your left knee, which has recently worsened, likely due to fluid buildup. -We will get an x-ray of your left knee to see how severe the arthritis is. -Take meloxicam 15 mg once daily for 1-2 weeks. -Rest your knee and apply ice every 4-6 hours for 10-20 minutes. -Use compression and elevate your knee to reduce swelling. -Avoid activities that make the pain worse. -We will reassess your symptoms in 1-2 weeks if they persist.  POLYMYALGIA RHEUMATICA: You are currently tapering your prednisone  dose to manage your polymyalgia rheumatica, which causes joint pain and fatigue. -Continue tapering your prednisone  dose from 12.5 mg to 10 mg next week. -Monitor your symptoms and we will adjust the tapering schedule as needed.  Return if symptoms worsen or fail to improve.   Take care, Dr Kennyth  PLEASE NOTE:  If you had any lab tests, please let us  know if you have not heard back within a few days. You may see your results on mychart before we have a chance to review them but we will give you a call once they are reviewed by us .   If we ordered any referrals today, please let us  know if you have not heard from their office within the next week.   If you had any urgent prescriptions sent in today, please check with the pharmacy within an hour of our visit to make sure the prescription was transmitted appropriately.   Please try these tips to maintain a healthy lifestyle:  Eat at least 3 REAL meals and 1-2 snacks per day.  Aim for no more than 5 hours between eating.  If you eat breakfast, please do so within one hour of getting up.   Each meal should contain half  fruits/vegetables, one quarter protein, and one quarter carbs (no bigger than a computer mouse)  Cut down on sweet beverages. This includes juice, soda, and sweet tea.   Drink at least 1 glass of water with each meal and aim for at least 8 glasses per day  Exercise at least 150 minutes every week.

## 2024-05-04 ENCOUNTER — Encounter: Payer: Self-pay | Admitting: Family Medicine

## 2024-05-05 ENCOUNTER — Ambulatory Visit: Payer: PPO

## 2024-05-05 VITALS — Ht 70.0 in | Wt 177.0 lb

## 2024-05-05 DIAGNOSIS — Z0000 Encounter for general adult medical examination without abnormal findings: Secondary | ICD-10-CM

## 2024-05-05 DIAGNOSIS — M51379 Other intervertebral disc degeneration, lumbosacral region without mention of lumbar back pain or lower extremity pain: Secondary | ICD-10-CM

## 2024-05-05 NOTE — Telephone Encounter (Signed)
 It is ok if he wears it to bed though it is more important if he wears it during active hours.

## 2024-05-05 NOTE — Patient Instructions (Signed)
 George Watson,  Thank you for taking the time for your Medicare Wellness Visit. I appreciate your continued commitment to your health goals. Please review the care plan we discussed, and feel free to reach out if I can assist you further.  Medicare recommends these wellness visits once per year to help you and your care team stay ahead of potential health issues. These visits are designed to focus on prevention, allowing your provider to concentrate on managing your acute and chronic conditions during your regular appointments.  Please note that Annual Wellness Visits do not include a physical exam. Some assessments may be limited, especially if the visit was conducted virtually. If needed, we may recommend a separate in-person follow-up with your provider.  Ongoing Care Seeing your primary care provider every 3 to 6 months helps us  monitor your health and provide consistent, personalized care.   Referrals If a referral was made during today's visit and you haven't received any updates within two weeks, please contact the referred provider directly to check on the status.  Recommended Screenings:  Health Maintenance  Topic Date Due   Medicare Annual Wellness Visit  04/29/2024   COVID-19 Vaccine (11 - Pfizer risk 2025-26 season) 09/20/2024   DTaP/Tdap/Td vaccine (4 - Td or Tdap) 04/28/2034   Pneumococcal Vaccine for age over 61  Completed   Flu Shot  Completed   Hepatitis C Screening  Completed   Zoster (Shingles) Vaccine  Completed   Meningitis B Vaccine  Aged Out   Colon Cancer Screening  Discontinued       04/30/2023    2:07 PM  Advanced Directives  Does Patient Have a Medical Advance Directive? Yes  Type of Estate agent of [place];Living will  Copy of Healthcare Power of Attorney in Chart? No - copy requested   Advance Care Planning is important because it: Ensures you receive medical care that aligns with your values, goals, and preferences. Provides  guidance to your family and loved ones, reducing the emotional burden of decision-making during critical moments.  Vision: Annual vision screenings are recommended for early detection of glaucoma, cataracts, and diabetic retinopathy. These exams can also reveal signs of chronic conditions such as diabetes and high blood pressure.  Dental: Annual dental screenings help detect early signs of oral cancer, gum disease, and other conditions linked to overall health, including heart disease and diabetes.  Please see the attached documents for additional preventive care recommendations.

## 2024-05-05 NOTE — Progress Notes (Signed)
 Subjective:   George Watson is a 78 y.o. who presents for a Medicare Wellness preventive visit.  As a reminder, Annual Wellness Visits don't include a physical exam, and some assessments may be limited, especially if this visit is performed virtually. We may recommend an in-person follow-up visit with your provider if needed.  Visit Complete: Virtual I connected with  George Watson on 05/05/24 by a video and audio enabled telemedicine application and verified that I am speaking with the correct person using two identifiers.  Patient Location: Home  Provider Location: Office/Clinic  I discussed the limitations of evaluation and management by telemedicine. The patient expressed understanding and agreed to proceed.  Vital Signs: Because this visit was a virtual/telehealth visit, some criteria may be missing or patient reported. Any vitals not documented were not able to be obtained and vitals that have been documented are patient reported.    Persons Participating in Visit: Patient.  AWV Questionnaire: Yes: Patient Medicare AWV questionnaire was completed by the patient on 05/01/24; I have confirmed that all information answered by patient is correct and no changes since this date.        Objective:    Today's Vitals   05/05/24 1456  Weight: 177 lb (80.3 kg)  Height: 5' 10 (1.778 m)   Body mass index is 25.4 kg/m.     05/05/2024    3:02 PM 04/30/2023    2:07 PM 11/14/2021    1:06 PM 11/15/2020    1:56 PM 10/10/2019    2:08 PM 07/07/2019    9:01 PM 07/07/2019    8:47 PM  Advanced Directives  Does Patient Have a Medical Advance Directive? Yes Yes Yes Yes Yes Yes Yes  Type of Estate agent of [place];Living will Healthcare Power of [place];Living will Healthcare Power of [place];Living will Living will Living will;Healthcare Power of Attorney Living will Living will  Does patient want to make changes to medical advance directive?     No - Patient  declined No - Patient declined   Copy of Healthcare Power of Attorney in Chart? No - copy requested No - copy requested   No - copy requested    Would patient like information on creating a medical advance directive?      No - Patient declined No - Patient declined    Current Medications (verified) Outpatient Encounter Medications as of 05/05/2024  Medication Sig   albuterol  (VENTOLIN  HFA) 108 (90 Base) MCG/ACT inhaler Inhale 2 puffs into the lungs every 6 (six) hours as needed for wheezing or shortness of breath (AS NEEDED FOR SHORTNESS OF BREATH).   aspirin  EC 81 MG EC tablet Take 1 tablet (81 mg total) by mouth daily.   clobetasol cream (TEMOVATE) 0.05 % 1 APPLICATION EXTERNALLY TWICE A DAY 30 DAYS   diazepam  (VALIUM ) 5 MG tablet Take 1 tablet (5 mg total) by mouth daily as needed.   diphenoxylate -atropine  (LOMOTIL ) 2.5-0.025 MG tablet Take 1 tablet by mouth 3 (three) times daily as needed for diarrhea or loose stools. Please schedule a F/U appointment in December for additional refills   dutasteride  (AVODART ) 0.5 MG capsule Take 0.5 mg by mouth daily.   fluorouracil (EFUDEX) 5 % cream Apply 1 Application topically 2 (two) times daily.   fluticasone  (FLONASE ) 50 MCG/ACT nasal spray Place 2 sprays into both nostrils daily.   glucosamine-chondroitin 500-400 MG tablet Take 3 tablets by mouth daily.   hydrochlorothiazide  (MICROZIDE ) 12.5 MG capsule TAKE 1 CAPSULE BY MOUTH EVERY  DAY   losartan  (COZAAR ) 100 MG tablet TAKE 1 TABLET BY MOUTH EVERY DAY   meloxicam (MOBIC) 15 MG tablet Take 1 tablet (15 mg total) by mouth daily.   methocarbamol  (ROBAXIN ) 500 MG tablet Take 1 tablet (500 mg total) by mouth every 8 (eight) hours as needed for muscle spasms.   predniSONE  (DELTASONE ) 10 MG tablet Take 1 tablet (10 mg total) by mouth daily with breakfast.   rosuvastatin  (CRESTOR ) 5 MG tablet TAKE 1 TABLET (5 MG TOTAL) BY MOUTH DAILY.   tamsulosin  (FLOMAX ) 0.4 MG CAPS capsule Take 0.4 mg by mouth daily.    TRELEGY ELLIPTA  100-62.5-25 MCG/ACT AEPB INHALE 1 PUFF BY MOUTH EVERY DAY   triamcinolone  ointment (KENALOG ) 0.5 % Apply 1 Application topically 2 (two) times daily.   [DISCONTINUED] amoxicillin -clavulanate (AUGMENTIN ) 875-125 MG tablet Take 1 tablet by mouth 2 (two) times daily. (Patient not taking: Reported on 05/03/2024)   No facility-administered encounter medications on file as of 05/05/2024.    Allergies (verified) Patient has no known allergies.   History: Past Medical History:  Diagnosis Date   Aortic aneurysm    Arthritis    BPH (benign prostatic hypertrophy)    Cataract 2015   Cirrhosis (HCC)    COPD (chronic obstructive pulmonary disease) (HCC)    DDD (degenerative disc disease), lumbosacral    Dyspnea    Elevated PSA    Emphysema of lung (HCC)    Essential hypertension, benign    diet controlled   Fracture of shaft of clavicle    GERD (gastroesophageal reflux disease)    H/O drug abuse (HCC)    multisubstance   Hepatitis B    Hepatitis C 10/1996   History of hepatitis C 10/19/1996   Dr. Luis with [facility]. Treated in 2014. Followed yearly.      HSV-2 (herpes simplex virus 2) infection    Hx of biopsy 08/2004   liver   Hx of colonic polyps 2022   Hyperlipidemia    Inguinal hernia    right   Other and unspecified hyperlipidemia    Prostate cancer (HCC)    Substance abuse (HCC)    Tuberculosis    Past Surgical History:  Procedure Laterality Date   BACK SURGERY     CERVICAL SPINE SURGERY     EYE SURGERY  3/20   fracture ribs     3   HERNIA REPAIR     INGUINAL HERNIA REPAIR  07/01/2012   Procedure: HERNIA REPAIR INGUINAL ADULT;  Surgeon: Redell Faith, DO;  Location: [facility] ORS;  Service: General;  Laterality: Right;  with Mesh   LAPAROSCOPIC LIVER ULTRASOUND N/A 11/21/2021   Procedure: LAPAROSCOPIC LIVER ULTRASOUND;  Surgeon: Dasie Leonor CROME, MD;  Location: [facility] OR;  Service: General;  Laterality: N/A;   LAPAROSCOPIC PARTIAL HEPATECTOMY Left 11/21/2021    Procedure: LAPAROSCOPIC PARTIAL HEPATECTOMY;  Surgeon: Dasie Leonor CROME, MD;  Location: [facility] OR;  Service: General;  Laterality: Left;   LAPAROSCOPY N/A 11/21/2021   Procedure: STAGING LAPAROSCOPY;  Surgeon: Dasie Leonor CROME, MD;  Location: [facility] OR;  Service: General;  Laterality: N/A;   left knee meniscus repair  07/21/1990   right rotator cuff  07/21/1990   right shoulder arthroscopy  07/21/2009   SPINE SURGERY  10/14   Family History  Problem Relation Age of Onset   Leukemia Mother    Throat cancer Father    Esophageal cancer Father    Cancer Father        esophageal cancer  Breast cancer Sister    Cancer Sister    Prostate cancer Brother    Melanoma Brother    Colon cancer Neg Hx    Stomach cancer Neg Hx    Pancreatic cancer Neg Hx    Liver disease Neg Hx    Social History   Socioeconomic History   Marital status: Married    Spouse name: Not on file   Number of children: 1   Years of education: Not on file   Highest education level: 12th grade  Occupational History   Occupation: Hospital doctor   Occupation: Retired     Associate Professor: Engineer, water    Comment: 20+ yrs   Tobacco Use   Smoking status: Former    Current packs/day: 0.00    Average packs/day: 1.5 packs/day for 43.0 years (64.5 ttl pk-yrs)    Types: Cigarettes    Start date: 02/22/1960    Quit date: 02/22/2003    Years since quitting: 21.2   Smokeless tobacco: Never  Vaping Use   Vaping status: Never Used  Substance and Sexual Activity   Alcohol use: Not Currently    Comment: 1988   Drug use: Not Currently    Types: Cocaine, Heroin    Comment: 26 years ago cocaine, heroin. methadone   Sexual activity: Not Currently    Comment: number of sex partners in the last 12 months  1  Other Topics Concern   Not on file  Social History Narrative      Married to [employer]. Exercise cardio daily for 30 minutes. Education: [employer].   Social Drivers of Corporate investment banker Strain: Low Risk  (05/05/2024)   Overall  Financial Resource Strain (CARDIA)    Difficulty of Paying Living Expenses: Not hard at all  Food Insecurity: No Food Insecurity (05/05/2024)   Hunger Vital Sign    Worried About Running Out of Food in the Last Year: Never true    Ran Out of Food in the Last Year: Never true  Transportation Needs: No Transportation Needs (05/05/2024)   PRAPARE - Administrator, Civil Service (Medical): No    Lack of Transportation (Non-Medical): No  Physical Activity: Insufficiently Active (05/05/2024)   Exercise Vital Sign    Days of Exercise per Week: 7 days    Minutes of Exercise per Session: 20 min  Stress: No Stress Concern Present (05/05/2024)   [employer] of Occupational Health - Occupational Stress Questionnaire    Feeling of Stress: Not at all  Social Connections: Moderately Integrated (05/05/2024)   Social Connection and Isolation Panel    Frequency of Communication with Friends and Family: More than three times a week    Frequency of Social Gatherings with Friends and Family: More than three times a week    Attends Religious Services: Never    Database administrator or Organizations: Yes    Attends Engineer, structural: 1 to 4 times per year    Marital Status: Married    Tobacco Counseling Counseling given: Not Answered    Clinical Intake:  Pre-visit preparation completed: Yes  Pain : No/denies pain     BMI - recorded: 25.4 Nutritional Status: BMI 25 -29 Overweight Nutritional Risks: None  Lab Results  Component Value Date   HGBA1C 6.9 (A) 03/08/2024   HGBA1C 6.6 (H) 12/08/2023   HGBA1C 6.3 (A) 12/07/2023     How often do you need to have someone help you when you read instructions, pamphlets, or other  written materials from your doctor or pharmacy?: 1 - Never  Interpreter Needed?: No  Information entered by :: Ellouise Haws, LPN   Activities of Daily Living     05/01/2024    7:08 AM  In your present state of health, do you have any  difficulty performing the following activities:  Hearing? 0  Vision? 0  Difficulty concentrating or making decisions? 0  Walking or climbing stairs? 0  Dressing or bathing? 0  Doing errands, shopping? 0  Preparing Food and eating ? N  Using the Toilet? N  In the past six months, have you accidently leaked urine? Y  Do you have problems with loss of bowel control? N  Managing your Medications? N  Managing your Finances? N  Housekeeping or managing your Housekeeping? N    Patient Care Team: Kennyth Worth HERO, MD as PCP - General (Family Medicine) Edith, Debby BROCKS, MD (Cardiology) Luis Purchase, MD (Gastroenterology) Harden Jerona GAILS, MD (Orthopedic Surgery) Ottelin, Mark, MD (Inactive) (Urology) Waylan Cain, MD as Consulting Physician (Ophthalmology) Joshua Sieving, MD as Consulting Physician (Dermatology) Lanny Callander, MD as Consulting Physician (Oncology)  I have updated your Care Teams any recent Medical Services you may have received from other providers in the past year.     Assessment:   This is a routine wellness examination for [facility].  Hearing/Vision screen Hearing Screening - Comments:: Pt denies any hearing issues  Vision Screening - Comments:: Wears rx glasses - up to date with routine eye exams with [facility] opthalmology    Goals Addressed             This Visit's Progress    Patient Stated       Maintain health and activity       Depression Screen     05/05/2024    3:03 PM 05/03/2024    8:41 AM 03/08/2024    9:17 AM 02/11/2024   11:55 AM 01/05/2024    8:22 AM 12/07/2023    1:25 PM 08/06/2023    8:48 AM  PHQ 2/9 Scores  PHQ - 2 Score 0 0 0 0 0 0 0  PHQ- 9 Score       0    Fall Risk     05/03/2024    8:41 AM 05/01/2024    7:08 AM 03/08/2024    9:17 AM 02/11/2024   11:55 AM 01/05/2024    8:22 AM  Fall Risk   Falls in the past year? 0 0 0 0 0  Number falls in past yr: 0 0 0 0 0  Injury with Fall? 0 0 0 0 0  Risk for fall due to : No Fall Risks  No Fall Risks No Fall Risks No Fall Risks No Fall Risks  Follow up  Falls prevention discussed       MEDICARE RISK AT HOME:  Medicare Risk at Home Any stairs in or around the home?: (Patient-Rptd) No If so, are there any without handrails?: (Patient-Rptd) No Home free of loose throw rugs in walkways, pet beds, electrical cords, etc?: (Patient-Rptd) No Adequate lighting in your home to reduce risk of falls?: (Patient-Rptd) Yes Life alert?: (Patient-Rptd) No Use of a cane, walker or w/c?: (Patient-Rptd) No Grab bars in the bathroom?: (Patient-Rptd) No Shower chair or bench in shower?: No Elevated toilet seat or a handicapped toilet?: (Patient-Rptd) No  TIMED UP AND GO:  Was the test performed?  No  Cognitive Function: 6CIT completed        05/05/2024  3:04 PM 04/30/2023    2:09 PM 05/02/2022   12:14 PM 11/15/2020    1:59 PM 10/10/2019    2:09 PM  6CIT Screen  What Year? 0 points 0 points 0 points 0 points 0 points  What month? 0 points 0 points  0 points 0 points  What time? 0 points 0 points 0 points  0 points  Count back from 20 0 points 0 points 0 points 0 points 0 points  Months in reverse 0 points 0 points 0 points 0 points 0 points  Repeat phrase 0 points 0 points 2 points 0 points 0 points  Total Score 0 points 0 points   0 points    Immunizations Immunization History  Administered Date(s) Administered   Fluad Quad(high Dose 65+) 03/29/2019, 05/21/2022   Hepatitis A 02/18/1998, 08/21/1998   INFLUENZA, HIGH DOSE SEASONAL PF 05/09/2015, 04/10/2016, 04/23/2016, 03/17/2018, 04/10/2021, 04/22/2023, 03/23/2024   Influenza Split 04/01/2011, 05/18/2012   Influenza, Seasonal, Injecte, Preservative Fre 05/01/2017   Influenza,inj,Quad PF,6+ Mos 04/28/2013, 04/21/2014   Influenza-Unspecified 04/21/2015, 05/21/2020   Moderna Covid-19 Fall Seasonal Vaccine 75yrs & older 05/21/2022, 04/22/2023, 03/23/2024   Moderna Covid-19 Vaccine Bivalent Booster 11yrs & up 05/21/2022    PFIZER(Purple Top)SARS-COV-2 Vaccination 08/10/2019, 08/31/2019, 05/21/2020, 11/19/2020   Pfizer Covid-19 Vaccine Bivalent Booster 11yrs & up 04/07/2021, 12/10/2021   Pneumococcal Conjugate-13 08/22/2014   Pneumococcal Polysaccharide-23 07/21/2002, 07/08/2016   Respiratory Syncytial Virus Vaccine,Recomb Aduvanted(Arexvy) 05/28/2022   Tdap 11/18/2005, 01/03/2015, 04/28/2024   Unspecified SARS-COV-2 Vaccination 01/14/2024   Zoster Recombinant(Shingrix ) 02/21/2022, 06/04/2022    Screening Tests Health Maintenance  Topic Date Due   COVID-19 Vaccine (11 - Pfizer risk 2025-26 season) 09/20/2024   Medicare Annual Wellness (AWV)  05/05/2025   DTaP/Tdap/Td (4 - Td or Tdap) 04/28/2034   Pneumococcal Vaccine: 50+ Years  Completed   Influenza Vaccine  Completed   Hepatitis C Screening  Completed   Zoster Vaccines- Shingrix   Completed   Meningococcal B Vaccine  Aged Out   Colonoscopy  Discontinued    Health Maintenance Items Addressed: See Nurse Notes at the end of this note  Additional Screening:  Vision Screening: Recommended annual ophthalmology exams for early detection of glaucoma and other disorders of the eye. Is the patient up to date with their annual eye exam?  Yes  Who is the provider or what is the name of the office in which the patient attends annual eye exams? [facility] Dr Adine Haddock   Dental Screening: Recommended annual dental exams for proper oral hygiene  Community Resource Referral / Chronic Care Management: CRR required this visit?  No   CCM required this visit?  No   Plan:    I have personally reviewed and noted the following in the patient's chart:   Medical and social history Use of alcohol, tobacco or illicit drugs  Current medications and supplements including opioid prescriptions. Patient is not currently taking opioid prescriptions. Functional ability and status Nutritional status Physical activity Advanced directives List of other  physicians Hospitalizations, surgeries, and ER visits in previous 12 months Vitals Screenings to include cognitive, depression, and falls Referrals and appointments  In addition, I have reviewed and discussed with patient certain preventive protocols, quality metrics, and best practice recommendations. A written personalized care plan for preventive services as well as general preventive health recommendations were provided to patient.   Ellouise VEAR Haws, LPN   [id number]/[id number]   After Visit Summary: (MyChart) Due to this being a telephonic visit, the after visit summary  with patients personalized plan was offered to patient via MyChart   Notes: Nothing significant to report at this time.

## 2024-05-05 NOTE — Telephone Encounter (Signed)
 Please advice

## 2024-05-10 NOTE — Telephone Encounter (Signed)
 See note

## 2024-05-11 NOTE — Telephone Encounter (Signed)
 I appreciate the update.  I am glad the pain seems to be improving.  The knee brace likely did contribute to his leg swelling.  He can continue with his current plan and let us  know if not improving.  Next step would be to have him see physical therapy or sports medicine.

## 2024-05-12 NOTE — Telephone Encounter (Signed)
 FYI

## 2024-05-16 DIAGNOSIS — N401 Enlarged prostate with lower urinary tract symptoms: Secondary | ICD-10-CM | POA: Diagnosis not present

## 2024-05-20 ENCOUNTER — Ambulatory Visit (HOSPITAL_COMMUNITY): Payer: Self-pay

## 2024-05-20 ENCOUNTER — Encounter: Payer: Self-pay | Admitting: Family Medicine

## 2024-05-20 ENCOUNTER — Ambulatory Visit (INDEPENDENT_AMBULATORY_CARE_PROVIDER_SITE_OTHER): Admitting: Family Medicine

## 2024-05-20 ENCOUNTER — Ambulatory Visit: Payer: Self-pay

## 2024-05-20 VITALS — BP 126/68 | HR 86 | Temp 97.9°F | Ht 70.0 in | Wt 179.8 lb

## 2024-05-20 DIAGNOSIS — I10 Essential (primary) hypertension: Secondary | ICD-10-CM | POA: Diagnosis not present

## 2024-05-20 DIAGNOSIS — M1990 Unspecified osteoarthritis, unspecified site: Secondary | ICD-10-CM

## 2024-05-20 DIAGNOSIS — J329 Chronic sinusitis, unspecified: Secondary | ICD-10-CM | POA: Diagnosis not present

## 2024-05-20 DIAGNOSIS — J449 Chronic obstructive pulmonary disease, unspecified: Secondary | ICD-10-CM | POA: Diagnosis not present

## 2024-05-20 MED ORDER — PROMETHAZINE-DM 6.25-15 MG/5ML PO SYRP: 5.0000 mL | ORAL_SOLUTION | Freq: Four times a day (QID) | ORAL | 0 refills | Status: DC | PRN

## 2024-05-20 MED ORDER — AMOXICILLIN-POT CLAVULANATE 875-125 MG PO TABS: 1.0000 | ORAL_TABLET | Freq: Two times a day (BID) | ORAL | 0 refills | Status: DC

## 2024-05-20 NOTE — Telephone Encounter (Signed)
 Appt today

## 2024-05-20 NOTE — Assessment & Plan Note (Signed)
 On albuterol  and Trelegy.  Reassuring lung exam today without any signs of COPD flare.

## 2024-05-20 NOTE — Assessment & Plan Note (Addendum)
 Initially elevated today but at goal on recheck on losartan  cologuard was daily and HCTZ 12.5 mg daily.  He will monitor home and let us  know if persistently elevated.

## 2024-05-20 NOTE — Telephone Encounter (Signed)
 FYI Only or Action Required?: FYI only for provider: appointment scheduled on 10/31 at 10am.  Patient was last seen in primary care on 05/03/2024 by Kennyth Worth HERO, MD.  Called Nurse Triage reporting Sinus Problem.  Symptoms began several weeks ago.  Interventions attempted: OTC medications: Robitussin DM and Tylenol .  Symptoms are: gradually worsening.  Triage Disposition: See PCP When Office is Open (Within 3 Days)  Patient/caregiver understands and will follow disposition?: Yes  Copied from CRM [id number]. Topic: Clinical - Red Word Triage >> May 20, 2024  8:06 AM Willma SAUNDERS wrote: [facility] that prompted transfer to Nurse Triage: Patient states he has had an ongoing nasal/sinus infection for the past week. Has gotten worse over the week. Reason for Disposition  [1] Sinus congestion (pressure, fullness) AND [2] present > 10 days  Answer Assessment - Initial Assessment Questions 1. LOCATION: Where does it hurt?      No pain  2. ONSET: When did the sinus pain start?  (e.g., hours, days)      Nio pain, sinus congestion started almost 2 weeka ago  3. SEVERITY: How bad is the pain?   (Scale 0-10; or none, mild, moderate or severe)     No pain  4. RECURRENT SYMPTOM: Have you ever had sinus problems before? If Yes, ask: When was the last time? and What happened that time?      Does not usually get sinus congestion  5. NASAL CONGESTION: Is the nose blocked? If Yes, ask: Can you open it or must you breathe through your mouth?     Yes and can feel the congestion in throat and chest  6. NASAL DISCHARGE: Do you have discharge from your nose? If so ask, What color?     Yellowish sputum  7. FEVER: Do you have a fever? If Yes, ask: What is it, how was it measured, and when did it start?      No  8. OTHER SYMPTOMS: Do you have any other symptoms? (e.g., sore throat, cough, earache, difficulty breathing)     Productive coughing  9. PREGNANCY: Is there any  chance you are pregnant? When was your last menstrual period?     no  Protocols used: Sinus Pain or Congestion-A-AH

## 2024-05-20 NOTE — Progress Notes (Signed)
 George Watson is a 78 y.o. male who presents today for an office visit.  Assessment/Plan:  New/Acute Problems: Sinusitis  No red flags or signs of systemic illness.  Exam consistent with URI though reassuring pulmonary exam.  His symptoms do seem to be improving over the last day or so.  Will send in a pocket prescription for Augmentin  with instruction to not start unless symptoms fail to improve over the next several days.  Will send from physician-dextromethorphan cough syrup.  He can resume his home medications including Flonase  and other over-the-counter medications as well.  We encouraged hydration.  We discussed reasons to return to care.  Follow-up as needed.  Chronic Problems Addressed Today: COPD (chronic obstructive pulmonary disease) (HCC) On albuterol  and Trelegy.  Reassuring lung exam today without any signs of COPD flare.  Osteoarthritis We started meloxicam a few weeks ago.  Seems to be working well.  He is also working with a land as well.  He will let us  know if he needs any further assistance.  Essential hypertension Initially elevated today but at goal on recheck on losartan  cologuard was daily and HCTZ 12.5 mg daily.  He will monitor home and let us  know if persistently elevated.     Subjective:  HPI:  See assessment / plan for status of chronic conditions.    Discussed the use of AI scribe software for clinical note transcription with the patient, who gave verbal consent to proceed.  History of Present Illness George Watson is a 78 year old male who presents with nasal congestion and cough.  He has been experiencing recurrent respiratory symptoms over the past several weeks. Initially, he was treated with antibiotics and increased steroids for a bronchial issue approximately three to four weeks ago, which resolved his symptoms. However, symptoms recurred a week ago during [place] the [place], where he experienced a return of respiratory discomfort.  He self-administered additional prednisone  for two days, which seemed to alleviate the symptoms by the end of that week.  On 4 days ago, his symptoms returned, characterized by significant nasal congestion and a copious amount of mucus. Today, the congestion has subsided, which he finds 'a little bit interesting'. He has been using Flonase  for nasal congestion, a treatment he has been on for years due to previous issues with ear congestion. He questions whether Flonase  could be contributing to the mucus production.  He describes his breathing as still affected by sinus issues, with some coughing and audible congestion. His symptoms were severe enough last night to consider visiting urgent care, but he canceled the appointment after securing a visit today. His wife moved to the guest room due to his coughing, which is a common occurrence during his flare-ups.  He is currently on a prednisone  dose of 12.5 mg and has not used his Trelegy inhaler this morning to avoid masking symptoms. He also inquires about continuing the use of an anti-inflammatory medication, meloxicam, prescribed for knee pain. He has been advised exercises by his chiropractor to strengthen the knee area.         Objective:  Physical Exam: BP 126/68   Pulse 86   Temp 97.9 F (36.6 C) (Temporal)   Ht 5' 10 (1.778 m)   Wt 179 lb 12.8 oz (81.6 kg)   SpO2 99%   BMI 25.80 kg/m   Gen: No acute distress, resting comfortably HEENT: TMs clear.  OP erythematous.  Nasal mucosa erythematous and boggy bilaterally with large amount  of mucus CV: Regular rate and rhythm with no murmurs appreciated Pulm: Normal work of breathing, clear to auscultation bilaterally with no crackles, wheezes, or rhonchi Neuro: Grossly normal, moves all extremities Psych: Normal affect and thought content      Kinley Ferrentino M. Kennyth, MD 05/20/2024 10:48 AM

## 2024-05-20 NOTE — Assessment & Plan Note (Signed)
 We started meloxicam a few weeks ago.  Seems to be working well.  He is also working with a land as well.  He will let us  know if he needs any further assistance.

## 2024-05-20 NOTE — Patient Instructions (Signed)
 It was very nice to see you today!  VISIT SUMMARY: During your visit, we discussed your recurrent respiratory symptoms, including nasal congestion and cough, as well as your knee pain and blood pressure. We have adjusted your treatment plan to help manage these issues.  YOUR PLAN: ACUTE UPPER RESPIRATORY INFECTION WITH SINUS SYMPTOMS: You have recurrent upper respiratory symptoms, including sinus congestion and mucus production. -If your symptoms worsen or persist, start taking Augmentin  as prescribed. -Use the cough medication provided for relief, especially at night. -Continue using Flonase  and monitor your symptoms. -Consider using Astelin  if your symptoms persist.  ESSENTIAL HYPERTENSION: Your blood pressure is slightly elevated, likely due to your acute illness, but it is typically well-controlled. -We will recheck your blood pressure before you leave the clinic.  KNEE PAIN: Your knee pain is being managed with meloxicam and exercises. -Continue taking meloxicam as prescribed. -Continue doing the exercises recommended by your chiropractor or physical therapist.  Return if symptoms worsen or fail to improve.   Take care, Dr Kennyth  PLEASE NOTE:  If you had any lab tests, please let us  know if you have not heard back within a few days. You may see your results on mychart before we have a chance to review them but we will give you a call once they are reviewed by us .   If we ordered any referrals today, please let us  know if you have not heard from their office within the next week.   If you had any urgent prescriptions sent in today, please check with the pharmacy within an hour of our visit to make sure the prescription was transmitted appropriately.   Please try these tips to maintain a healthy lifestyle:  Eat at least 3 REAL meals and 1-2 snacks per day.  Aim for no more than 5 hours between eating.  If you eat breakfast, please do so within one hour of getting up.   Each  meal should contain half fruits/vegetables, one quarter protein, and one quarter carbs (no bigger than a computer mouse)  Cut down on sweet beverages. This includes juice, soda, and sweet tea.   Drink at least 1 glass of water with each meal and aim for at least 8 glasses per day  Exercise at least 150 minutes every week.

## 2024-05-23 ENCOUNTER — Encounter: Payer: Self-pay | Admitting: Family Medicine

## 2024-05-27 ENCOUNTER — Other Ambulatory Visit: Payer: Self-pay | Admitting: Family Medicine

## 2024-05-30 ENCOUNTER — Encounter: Payer: Self-pay | Admitting: Family Medicine

## 2024-05-30 ENCOUNTER — Ambulatory Visit (INDEPENDENT_AMBULATORY_CARE_PROVIDER_SITE_OTHER): Admitting: Family Medicine

## 2024-05-30 VITALS — BP 152/80 | HR 79 | Temp 98.1°F | Ht 70.0 in | Wt 178.8 lb

## 2024-05-30 DIAGNOSIS — M353 Polymyalgia rheumatica: Secondary | ICD-10-CM

## 2024-05-30 DIAGNOSIS — I10 Essential (primary) hypertension: Secondary | ICD-10-CM | POA: Diagnosis not present

## 2024-05-30 MED ORDER — HYDROCHLOROTHIAZIDE 12.5 MG PO CAPS: 25.0000 mg | ORAL_CAPSULE | Freq: Every day | ORAL | 3 refills | Status: DC

## 2024-05-30 NOTE — Assessment & Plan Note (Signed)
 Follows with rheumatology.  Now on prednisone  9 mg daily.  Symptoms are manageable.

## 2024-05-30 NOTE — Assessment & Plan Note (Signed)
 Not controlled today and has been above goal the last several days.  We will increase HCTZ to 25 mg daily.  He will continue losartan  100 mg daily.  He will monitor at home and follow-up with us  in a couple of weeks.

## 2024-05-30 NOTE — Progress Notes (Signed)
   George Watson is a 78 y.o. male who presents today for an office visit.  Assessment/Plan:  Chronic Problems Addressed Today: Essential hypertension Not controlled today and has been above goal the last several days.  We will increase HCTZ to 25 mg daily.  He will continue losartan  100 mg daily.  He will monitor at home and follow-up with us  in a couple of weeks.   PMR (polymyalgia rheumatica) Follows with rheumatology.  Now on prednisone  9 mg daily.  Symptoms are manageable.     Subjective:  HPI:  See assessment / plan for status of chronic conditions. Patient here with concern for elevated BP readings. First noticed a few days ago. Had a mild headache and checked BP and 160's/80's. It has been persistently elevated this range over the last few days.  Still has mild headache.  He has not had any change in diet or exercise regimen.  No change in medications.  Is been compliant with his prescribed medications       Objective:  Physical Exam: BP (!) 152/80   Pulse 79   Temp 98.1 F (36.7 C) (Temporal)   Ht 5' 10 (1.778 m)   Wt 178 lb 12.8 oz (81.1 kg)   SpO2 95%   BMI 25.66 kg/m   Gen: No acute distress, resting comfortably CV: Regular rate and rhythm with no murmurs appreciated Pulm: Normal work of breathing, clear to auscultation bilaterally with no crackles, wheezes, or rhonchi Neuro: Grossly normal, moves all extremities Psych: Normal affect and thought content      George Watson M. Kennyth, MD 05/30/2024 1:23 PM

## 2024-05-30 NOTE — Patient Instructions (Addendum)
 It was very nice to see you today!  Please increase your HCTZ to 25 mg daily.  Monitor your blood pressure, follow-up with us  in a few weeks.  Return if symptoms worsen or fail to improve.   Take care, Dr Kennyth  PLEASE NOTE:  If you had any lab tests, please let us  know if you have not heard back within a few days. You may see your results on mychart before we have a chance to review them but we will give you a call once they are reviewed by us .   If we ordered any referrals today, please let us  know if you have not heard from their office within the next week.   If you had any urgent prescriptions sent in today, please check with the pharmacy within an hour of our visit to make sure the prescription was transmitted appropriately.   Please try these tips to maintain a healthy lifestyle:  Eat at least 3 REAL meals and 1-2 snacks per day.  Aim for no more than 5 hours between eating.  If you eat breakfast, please do so within one hour of getting up.   Each meal should contain half fruits/vegetables, one quarter protein, and one quarter carbs (no bigger than a computer mouse)  Cut down on sweet beverages. This includes juice, soda, and sweet tea.   Drink at least 1 glass of water with each meal and aim for at least 8 glasses per day  Exercise at least 150 minutes every week.

## 2024-06-08 ENCOUNTER — Encounter: Payer: Self-pay | Admitting: Gastroenterology

## 2024-06-08 ENCOUNTER — Ambulatory Visit (INDEPENDENT_AMBULATORY_CARE_PROVIDER_SITE_OTHER): Admitting: Family Medicine

## 2024-06-08 ENCOUNTER — Encounter: Payer: Self-pay | Admitting: Family Medicine

## 2024-06-08 VITALS — BP 138/80 | HR 74 | Temp 97.7°F | Ht 70.0 in | Wt 179.0 lb

## 2024-06-08 DIAGNOSIS — I10 Essential (primary) hypertension: Secondary | ICD-10-CM

## 2024-06-08 DIAGNOSIS — R413 Other amnesia: Secondary | ICD-10-CM | POA: Diagnosis not present

## 2024-06-08 DIAGNOSIS — B370 Candidal stomatitis: Secondary | ICD-10-CM

## 2024-06-08 DIAGNOSIS — R7303 Prediabetes: Secondary | ICD-10-CM

## 2024-06-08 DIAGNOSIS — R6889 Other general symptoms and signs: Secondary | ICD-10-CM | POA: Insufficient documentation

## 2024-06-08 DIAGNOSIS — E1165 Type 2 diabetes mellitus with hyperglycemia: Secondary | ICD-10-CM | POA: Diagnosis not present

## 2024-06-08 LAB — POCT GLYCOSYLATED HEMOGLOBIN (HGB A1C): Hemoglobin A1C: 7.1 % — AB (ref 4.0–5.6)

## 2024-06-08 MED ORDER — LOSARTAN POTASSIUM-HCTZ 100-25 MG PO TABS: 1.0000 | ORAL_TABLET | Freq: Every day | ORAL | 3 refills | Status: AC

## 2024-06-08 MED ORDER — CLOTRIMAZOLE 10 MG MT TROC: 10.0000 mg | Freq: Every day | OROMUCOSAL | 0 refills | Status: AC

## 2024-06-08 MED ORDER — METFORMIN HCL 500 MG PO TABS: 500.0000 mg | ORAL_TABLET | Freq: Every day | ORAL | 0 refills | Status: DC

## 2024-06-08 NOTE — Progress Notes (Signed)
 George Watson is a 78 y.o. male who presents today for an office visit.  Assessment/Plan:  New/Acute Problems: Thrush Patient with small white plaque on lateral aspect of right tongue base consistent with thrush.  We are working on A1c control as below.  Will start clotrimazole  troche as wel he will let us  know if not improving.l.    Chronic Problems Addressed Today: Type 2 diabetes mellitus (HCC) A1c 7.1.  Likely elevated due to chronic prednisone  for PMR.  He is now on 9 mg daily and working on weaning down on this.  We discussed treatment options for this.  He will likely be on prednisone  for at least the next 1 to 2 years and would be reasonable for us  to start antihyperglycemic medication at this point.  We also discussed lifestyle modifications.  We did discuss medication options including metformin  versus GLP agonist.  Will try metformin  500 mg daily.  We discussed potential side effects.  He will follow-up with us  in a few weeks via MyChart.  Recheck A1c in 3 months.  Essential hypertension We saw him a week ago for this and increased HCTZ to 25 mg daily.  Blood pressure has been trending down over the last week.  Still having occasional headache but overall this does seem to be improving.  Hopeful this will continue to improve over the next couple of weeks and hopefully headaches will also improve with treatment for his hyperglycemia as well.  He will continue his current regimen of losartan /HCTZ 100-25 once daily and he will follow-up with us  in a few weeks via MyChart.  Forgetfulness Patient has noticed more forgetfulness lately.  We did discuss referral to neurology however we will work on improving his A1c and blood pressure control and he will follow-up with us  in a few weeks.  We will see him back in office in 3 months.  If still an ongoing issue that point we can refer to neurology.  He has had MRI a few years ago and recent labs-do not think we need repeating of this  today.     Subjective:  HPI:  See assessment / plan for status of chronic conditions.   Discussed the use of AI scribe software for clinical note transcription with the patient, who gave verbal consent to proceed.  History of Present Illness George Watson is a 78 year old male with hypertension and diabetes who presents for a follow-up regarding blood pressure and blood sugar management.  He has been monitoring his blood pressure three to four times daily, with readings in the 150s to 160s mmHg range initially. His blood pressure has decreased somewhat since his hydrochlorothiazide  dose was doubled a week ago. He experiences intermittent headaches throughout the day, which he describes as annoying but not debilitating. He is currently taking hydrochlorothiazide , which was recently increased to 25 mg, and losartan .  His blood sugar levels have been increasing, with a recent A1c of 7.1%. He attributes this rise to prednisone  use, which he has been on for a condition requiring a dose of 9 mg. He reports some vision changes, noting difficulty focusing on small objects, but denies any eye pain. He had cataract surgery a few years ago and is concerned about the impact of prednisone  on his blood sugar and vision.  He reports a sore spot on his tongue that has persisted for three to four weeks. He initially tried salt water rinses, which provided some relief, but the sore spot returned after he  stopped. He describes it as annoying.  He mentions concerns about his short-term memory, noting increased absent-mindedness and difficulty with word-finding. He recalls undergoing evaluation for early-onset memory issues about ten years ago, including a brain scan in 2021 following a TIA.         Objective:  Physical Exam: BP 138/80   Pulse 74   Temp 97.7 F (36.5 C) (Temporal)   Ht 5' 10 (1.778 m)   Wt 179 lb (81.2 kg)   SpO2 99%   BMI 25.68 kg/m   Gen: No acute distress, resting  comfortably HEENT: 1 cm white patch with erythematous base on right posterior tongue Neuro: Grossly normal, moves all extremities Psych: Normal affect and thought content      [name] M. Kennyth, MD 06/08/2024 9:36 AM

## 2024-06-08 NOTE — Assessment & Plan Note (Signed)
 A1c 7.1.  Likely elevated due to chronic prednisone  for PMR.  He is now on 9 mg daily and working on weaning down on this.  We discussed treatment options for this.  He will likely be on prednisone  for at least the next 1 to 2 years and would be reasonable for us  to start antihyperglycemic medication at this point.  We also discussed lifestyle modifications.  We did discuss medication options including metformin versus GLP agonist.  Will try metformin 500 mg daily.  We discussed potential side effects.  He will follow-up with us  in a few weeks via MyChart.  Recheck A1c in 3 months.

## 2024-06-08 NOTE — Assessment & Plan Note (Signed)
 We saw him a week ago for this and increased HCTZ to 25 mg daily.  Blood pressure has been trending down over the last week.  Still having occasional headache but overall this does seem to be improving.  Hopeful this will continue to improve over the next couple of weeks and hopefully headaches will also improve with treatment for his hyperglycemia as well.  He will continue his current regimen of losartan /HCTZ 100-25 once daily and he will follow-up with us  in a few weeks via MyChart.

## 2024-06-08 NOTE — Assessment & Plan Note (Signed)
 Patient has noticed more forgetfulness lately.  We did discuss referral to neurology however we will work on improving his A1c and blood pressure control and he will follow-up with us  in a few weeks.  We will see him back in office in 3 months.  If still an ongoing issue that point we can refer to neurology.  He has had MRI a few years ago and recent labs-do not think we need repeating of this today.

## 2024-06-08 NOTE — Patient Instructions (Signed)
 It was very nice to see you today!  VISIT SUMMARY: You came in for a follow-up on your blood pressure and blood sugar management. We discussed your current medications, symptoms, and made some adjustments to your treatment plan.  YOUR PLAN: ESSENTIAL HYPERTENSION: Your blood pressure remains elevated, but there has been some improvement since increasing your hydrochlorothiazide  dose. -We will switch you to a combination tablet of losartan  and hydrochlorothiazide . -Please continue to monitor your blood pressure and send a MyChart message with your readings in 1-2 weeks.  TYPE 2 DIABETES MELLITUS: Your blood sugar levels have been increasing, likely due to prednisone  use. -We discussed and decided to start you on metformin . Take 1 pill daily with breakfast. -Monitor your blood sugar and symptoms, and send a MyChart message with updates in 1-2 weeks.  ORAL CANDIDIASIS (THRUSH): You have a sore spot on your tongue, likely due to thrush related to high blood sugar. -We prescribed clotrimazole  lozenges. Use them 5 times daily.  HEADACHE: You are experiencing intermittent headaches, possibly linked to high blood pressure and blood sugar. -Monitor your headache symptoms and report via MyChart in 1-2 weeks.  GENERAL HEALTH MAINTENANCE: We discussed the impact of elevated blood sugar on memory and overall health. -If your memory issues persist after managing your blood sugar, we may consider a referral to neurology.  Return in about 3 months (around 09/08/2024).   Take care, Dr Kennyth  PLEASE NOTE:  If you had any lab tests, please let us  know if you have not heard back within a few days. You may see your results on mychart before we have a chance to review them but we will give you a call once they are reviewed by us .   If we ordered any referrals today, please let us  know if you have not heard from their office within the next week.   If you had any urgent prescriptions sent in today, please  check with the pharmacy within an hour of our visit to make sure the prescription was transmitted appropriately.   Please try these tips to maintain a healthy lifestyle:  Eat at least 3 REAL meals and 1-2 snacks per day.  Aim for no more than 5 hours between eating.  If you eat breakfast, please do so within one hour of getting up.   Each meal should contain half fruits/vegetables, one quarter protein, and one quarter carbs (no bigger than a computer mouse)  Cut down on sweet beverages. This includes juice, soda, and sweet tea.   Drink at least 1 glass of water with each meal and aim for at least 8 glasses per day  Exercise at least 150 minutes every week.

## 2024-06-20 ENCOUNTER — Encounter: Payer: Self-pay | Admitting: Family Medicine

## 2024-06-20 NOTE — Telephone Encounter (Signed)
 Please review blood pressure log attached and advise on how to proceed. Please and thank you!

## 2024-06-20 NOTE — Telephone Encounter (Signed)
 I am seeing a few more readings in the 140's and 150's than I would like. Can we verify that he has been taking losartan  100 mg daily and hydrochlorothiazide  25 daily?  If so then we should probably add on an additional medication.

## 2024-06-20 NOTE — Telephone Encounter (Signed)
 Called and spoke to patient and he confirmed that he indeed is taking both blood pressure medications. Please advise on next steps.

## 2024-06-29 DIAGNOSIS — Z7952 Long term (current) use of systemic steroids: Secondary | ICD-10-CM | POA: Diagnosis not present

## 2024-06-30 NOTE — Telephone Encounter (Signed)
Please advse 

## 2024-06-30 NOTE — Telephone Encounter (Signed)
 The prednisone  is probably driving up his BP however we should treat to a goal of 140/90 or lower as he will likely be able prednisone  for several more months to years.  At this point would recommend starting an additional blood pressure medication amlodipine  5 mg daily and have him follow-up with us  in a couple of weeks.  Please send in a prescription if he is agreeable.

## 2024-07-02 ENCOUNTER — Other Ambulatory Visit: Payer: Self-pay | Admitting: Adult Health

## 2024-07-04 ENCOUNTER — Other Ambulatory Visit: Payer: Self-pay | Admitting: *Deleted

## 2024-07-04 MED ORDER — AMLODIPINE BESYLATE 5 MG PO TABS: 5.0000 mg | ORAL_TABLET | Freq: Every day | ORAL | 0 refills | Status: DC

## 2024-07-04 NOTE — Telephone Encounter (Signed)
 Please advise last refill done by Parrett, Madelin RAMAN, NP

## 2024-07-06 NOTE — Telephone Encounter (Signed)
Ok to refill the trelegy?

## 2024-07-07 ENCOUNTER — Other Ambulatory Visit: Payer: Self-pay | Admitting: *Deleted

## 2024-07-07 MED ORDER — TRELEGY ELLIPTA 100-62.5-25 MCG/ACT IN AEPB: INHALATION_SPRAY | RESPIRATORY_TRACT | 5 refills | Status: AC

## 2024-07-22 ENCOUNTER — Other Ambulatory Visit: Payer: Self-pay | Admitting: Family Medicine

## 2024-07-22 ENCOUNTER — Encounter: Payer: Self-pay | Admitting: Family Medicine

## 2024-07-25 NOTE — Telephone Encounter (Signed)
 Ok to refill his meloxicam  however recommend referral to sports medicine if he is still having significant pain.

## 2024-07-25 NOTE — Telephone Encounter (Signed)
 Please advice

## 2024-07-27 ENCOUNTER — Other Ambulatory Visit: Payer: Self-pay | Admitting: Gastroenterology

## 2024-07-27 ENCOUNTER — Other Ambulatory Visit: Payer: Self-pay

## 2024-07-27 ENCOUNTER — Other Ambulatory Visit: Payer: Self-pay | Admitting: *Deleted

## 2024-07-27 DIAGNOSIS — M25569 Pain in unspecified knee: Secondary | ICD-10-CM

## 2024-07-27 MED ORDER — DIPHENOXYLATE-ATROPINE 2.5-0.025 MG PO TABS: 1.0000 | ORAL_TABLET | Freq: Three times a day (TID) | ORAL | 0 refills | Status: DC | PRN

## 2024-07-27 NOTE — Addendum Note (Signed)
 Addended by: LANETTE ALETHEA CROME on: 07/27/2024 02:53 PM   Modules accepted: Orders

## 2024-07-27 NOTE — Telephone Encounter (Signed)
 sports medicine referral placed

## 2024-07-28 ENCOUNTER — Other Ambulatory Visit: Payer: Self-pay | Admitting: Surgery

## 2024-07-28 DIAGNOSIS — I7121 Aneurysm of the ascending aorta, without rupture: Secondary | ICD-10-CM

## 2024-08-01 NOTE — Progress Notes (Unsigned)
 "               [facility] [address] [place] [address] Phone: [phone]   Assessment and Plan:     1. Post-traumatic osteoarthritis of left knee (Primary) 2. Chronic pain of left knee -Chronic with exacerbation, initial sports medicine visit - Consistent with flare of osteoarthritis of left knee, severe in medial compartment, secondary to meniscal repair 30+ years ago - Patient has been using meloxicam  15 mg daily for 3 months, prednisone  10 mg daily chronically for PMR, with medications only providing mild relief. - Continue chronic prednisone  10 mg daily for PMR - Use meloxicam  15 mg daily as needed for breakthrough pain.  Recommend limiting chronic NSAIDs to 1-2 doses per week to prevent long-term side effects. Use Tylenol  500 to 1000 mg tablets 2-3 times a day as needed for day-to-day pain relief.    - Start HEP for knee - Reviewed patient's x-ray with patient in clinic.  My interpretation: No acute fracture or dislocation.  Severe medial compartment degenerative changes.  Mild lateral and patellofemoral compartment degenerative changes.  Bony remodeling.  Radiology read suggests renal osteodystrophy based on dense osseous structures, however patient has no significant history of CKD or renal disease.  Suspect finding may be due to PMR, chronic prednisone  use  Procedure: Knee Joint Injection Side: Left Indication: Flare of osteoarthritis  Risks explained and consent was given verbally. The site was cleaned with alcohol prep. A needle was introduced with an anterio-lateral approach. Injection given using 2mL of 1% lidocaine  without epinephrine  and 1mL of kenalog  40mg /ml. This was well tolerated.  Needle was removed, hemostasis achieved, and post injection instructions were explained.   Pt was advised to call or return to clinic if these symptoms worsen or fail to improve as anticipated.    15 additional minutes spent for educating  Therapeutic Home Exercise Program.  This included exercises focusing on stretching, strengthening, with focus on eccentric aspects.   Long term goals include an improvement in range of motion, strength, endurance as well as avoiding reinjury. Patient's frequency would include in 1-2 times a day, 3-5 times a week for a duration of 6-12 weeks. Proper technique shown and discussed handout in great detail with ATC.  All questions were discussed and answered.    Pertinent previous records reviewed include left knee x-ray 05/03/2024   Follow Up: 4 weeks for reevaluation.  If no improvement or worsening of symptoms, could consider alternative injection such as Zilretta  versus HA, could consider GAE referral.  Patient is not interested in surgery at this time   Subjective:   I, George Watson, am serving as a neurosurgeon for Doctor George Watson  Chief Complaint: left knee pain   HPI:   08/02/2024 Patient is a 79 year old male with left knee pain. Patient states hx of meniscus surgery 91 pain has been increasing. He developed PMR. When he leans on his knee feels like bone on bone. Meloxicam  for the pain and that helps a little. He is also on prednisone . No radiating pain. Pain going down the steps    Relevant Historical Information: PMR on chronic prednisone , DM type II due to chronic prednisone  use, history of cirrhosis hepatocellular carcinoma, COPD, prostate cancer  Additional pertinent review of systems negative.  Current Medications[1]   Objective:     Vitals:   08/02/24 1102  BP: 130/78  Pulse: 71  SpO2: 98%  Weight: 181 lb (82.1 kg)  Height: 5' 10 (1.778 m)      Body mass index is 25.97 kg/m.    Physical Exam:    General:  awake, alert oriented, no acute distress nontoxic Skin: no suspicious lesions or rashes Neuro:sensation intact and strength 5/5 with no deficits, no atrophy, normal muscle tone Psych: No signs of anxiety, depression or other mood disorder  Left  knee: Mild swelling No deformity Neg fluid wave, joint milking ROM Flex 110, Ext 0 TTP medial joint line, posterior fossa NTTP over the quad tendon, medial fem condyle, lat fem condyle, patella, patella tendon, tibial tuberostiy, fibular head,   pes anserine bursa, gerdy's tubercle,  lateral jt line Neg anterior and posterior drawer  Gait normal    Electronically signed by:  [facility] 11:31 AM 08/02/2024     [1]  Current Outpatient Medications:    albuterol  (VENTOLIN  HFA) 108 (90 Base) MCG/ACT inhaler, Inhale 2 puffs into the lungs every 6 (six) hours as needed for wheezing or shortness of breath (AS NEEDED FOR SHORTNESS OF BREATH)., Disp: 18 each, Rfl: 5   amLODipine  (NORVASC ) 5 MG tablet, Take 1 tablet (5 mg total) by mouth daily., Disp: 90 tablet, Rfl: 0   aspirin  EC 81 MG EC tablet, Take 1 tablet (81 mg total) by mouth daily., Disp: , Rfl:    clobetasol cream (TEMOVATE) 0.05 %, 1 APPLICATION EXTERNALLY TWICE A DAY 30 DAYS, Disp: , Rfl:    co-enzyme Q-10 30 MG capsule, Take 30 mg by mouth daily at 8 pm., Disp: , Rfl:    diazepam  (VALIUM ) 5 MG tablet, Take 1 tablet (5 mg total) by mouth daily as needed., Disp: 30 tablet, Rfl: 0   diphenoxylate -atropine  (LOMOTIL ) 2.5-0.025 MG tablet, Take 1 tablet by mouth 3 (three) times daily as needed for diarrhea or loose stools. Please keep your January appointment for further refills. Thank you, Disp: 90 tablet, Rfl: 0   dutasteride  (AVODART ) 0.5 MG capsule, Take 0.5 mg by mouth daily., Disp: , Rfl:    fluorouracil (EFUDEX) 5 % cream, Apply 1 Application topically 2 (two) times daily., Disp: , Rfl:    fluticasone  (FLONASE ) 50 MCG/ACT nasal spray, Place 2 sprays into both nostrils daily., Disp: 16 g, Rfl: 11   Fluticasone -Umeclidin-Vilant (TRELEGY ELLIPTA ) 100-62.5-25 MCG/ACT AEPB, INHALE 1 PUFF BY MOUTH EVERY DAY, Disp: 60 each, Rfl: 5   glucosamine-chondroitin 500-400 MG tablet, Take 3 tablets by mouth  daily., Disp: , Rfl:    losartan -hydrochlorothiazide  (HYZAAR) 100-25 MG tablet, Take 1 tablet by mouth daily., Disp: 90 tablet, Rfl: 3   meloxicam  (MOBIC ) 15 MG tablet, TAKE 1 TABLET (15 MG TOTAL) BY MOUTH DAILY., Disp: 30 tablet, Rfl: 0   metFORMIN  (GLUCOPHAGE ) 500 MG tablet, Take 1 tablet (500 mg total) by mouth daily with breakfast., Disp: 90 tablet, Rfl: 0   methocarbamol  (ROBAXIN ) 500 MG tablet, Take 1 tablet (500 mg total) by mouth every 8 (eight) hours as needed for muscle spasms., Disp: 30 tablet, Rfl: 5   Omega-3 Fatty Acids (FISH OIL) 300 MG CAPS, Take by mouth daily., Disp: , Rfl:    rosuvastatin  (CRESTOR ) 5 MG tablet, TAKE 1 TABLET (5 MG TOTAL) BY MOUTH DAILY., Disp: 90 tablet, Rfl: 1   tamsulosin  (FLOMAX ) 0.4 MG CAPS capsule, Take 0.4 mg by mouth daily., Disp: , Rfl:    triamcinolone  ointment (KENALOG ) 0.5 %, Apply 1 Application topically 2 (two) times daily., Disp: 30 g, Rfl: 0   Turmeric (QC TUMERIC COMPLEX PO), Take  by mouth daily at 2 PM., Disp: , Rfl:   "

## 2024-08-02 ENCOUNTER — Ambulatory Visit: Admitting: Sports Medicine

## 2024-08-02 VITALS — BP 130/78 | HR 71 | Ht 70.0 in | Wt 181.0 lb

## 2024-08-02 DIAGNOSIS — M1732 Unilateral post-traumatic osteoarthritis, left knee: Secondary | ICD-10-CM | POA: Diagnosis not present

## 2024-08-02 DIAGNOSIS — M25562 Pain in left knee: Secondary | ICD-10-CM

## 2024-08-02 DIAGNOSIS — G8929 Other chronic pain: Secondary | ICD-10-CM | POA: Diagnosis not present

## 2024-08-02 NOTE — Patient Instructions (Signed)
 Knee HEP  - Use meloxicam  15 mg daily as needed for breakthrough pain.  Recommend limiting chronic NSAIDs to 1-2 doses per week to prevent long-term side effects. Use Tylenol  500 to 1000 mg tablets 2-3 times a day as needed for day-to-day pain relief.    4 week follow up

## 2024-08-05 ENCOUNTER — Ambulatory Visit (HOSPITAL_COMMUNITY)
Admission: RE | Admit: 2024-08-05 | Discharge: 2024-08-05 | Disposition: A | Discharge status: Home / Self Care | Source: Ambulatory Visit | Attending: Cardiology | Admitting: Cardiology

## 2024-08-05 DIAGNOSIS — E1165 Type 2 diabetes mellitus with hyperglycemia: Secondary | ICD-10-CM | POA: Insufficient documentation

## 2024-08-05 DIAGNOSIS — I7121 Aneurysm of the ascending aorta, without rupture: Secondary | ICD-10-CM | POA: Insufficient documentation

## 2024-08-05 LAB — POCT I-STAT CREATININE: Creatinine, Ser: 1 mg/dL (ref 0.61–1.24)

## 2024-08-05 MED ORDER — IOHEXOL 350 MG/ML SOLN
75.0000 mL | Freq: Once | INTRAVENOUS | Status: AC | PRN
  Administered 2024-08-05: 75 mL via INTRAVENOUS

## 2024-08-07 ENCOUNTER — Encounter: Payer: Self-pay | Admitting: Family Medicine

## 2024-08-07 DIAGNOSIS — J418 Mixed simple and mucopurulent chronic bronchitis: Secondary | ICD-10-CM

## 2024-08-08 ENCOUNTER — Other Ambulatory Visit: Payer: Self-pay | Admitting: *Deleted

## 2024-08-08 MED ORDER — VITAMIN K2-VITAMIN D3 90-125 MCG PO CAPS: 90.0000 ug | ORAL_CAPSULE | Freq: Every day | ORAL | Status: AC

## 2024-08-08 NOTE — Telephone Encounter (Signed)
 Prescription updated in patient medication list

## 2024-08-09 NOTE — Telephone Encounter (Signed)
 I think it is fine for him to stop the vitamin D  and k2. Please add the doxycycline  to his medication(s) list.

## 2024-08-12 ENCOUNTER — Encounter: Payer: Self-pay | Admitting: Gastroenterology

## 2024-08-12 ENCOUNTER — Other Ambulatory Visit: Payer: Self-pay

## 2024-08-12 MED ORDER — DIPHENOXYLATE-ATROPINE 2.5-0.025 MG PO TABS: 1.0000 | ORAL_TABLET | Freq: Two times a day (BID) | ORAL | 1 refills | Status: AC | PRN

## 2024-08-15 ENCOUNTER — Ambulatory Visit: Admitting: Gastroenterology

## 2024-08-19 MED ORDER — ALBUTEROL SULFATE HFA 108 (90 BASE) MCG/ACT IN AERS: 2.0000 | INHALATION_SPRAY | Freq: Four times a day (QID) | RESPIRATORY_TRACT | 5 refills | Status: AC | PRN

## 2024-08-23 ENCOUNTER — Encounter: Payer: Self-pay | Admitting: Physician Assistant

## 2024-08-23 ENCOUNTER — Other Ambulatory Visit (INDEPENDENT_AMBULATORY_CARE_PROVIDER_SITE_OTHER)

## 2024-08-23 ENCOUNTER — Ambulatory Visit: Admitting: Physician Assistant

## 2024-08-23 VITALS — BP 130/80 | HR 92 | Ht 70.25 in | Wt 179.2 lb

## 2024-08-23 DIAGNOSIS — Z8619 Personal history of other infectious and parasitic diseases: Secondary | ICD-10-CM

## 2024-08-23 DIAGNOSIS — K7460 Unspecified cirrhosis of liver: Secondary | ICD-10-CM

## 2024-08-23 DIAGNOSIS — C220 Liver cell carcinoma: Secondary | ICD-10-CM

## 2024-08-23 DIAGNOSIS — K580 Irritable bowel syndrome with diarrhea: Secondary | ICD-10-CM | POA: Diagnosis not present

## 2024-08-23 LAB — COMPREHENSIVE METABOLIC PANEL WITH GFR
ALT: 37 U/L (ref 3–53)
AST: 23 U/L (ref 5–37)
Albumin: 4.6 g/dL (ref 3.5–5.2)
Alkaline Phosphatase: 51 U/L (ref 39–117)
BUN: 20 mg/dL (ref 6–23)
CO2: 25 meq/L (ref 19–32)
Calcium: 10.1 mg/dL (ref 8.4–10.5)
Chloride: 101 meq/L (ref 96–112)
Creatinine, Ser: 0.87 mg/dL (ref 0.40–1.50)
GFR: 83.32 mL/min
Glucose, Bld: 154 mg/dL — ABNORMAL HIGH (ref 70–99)
Potassium: 3.9 meq/L (ref 3.5–5.1)
Sodium: 136 meq/L (ref 135–145)
Total Bilirubin: 0.7 mg/dL (ref 0.2–1.2)
Total Protein: 7.2 g/dL (ref 6.0–8.3)

## 2024-08-23 LAB — CBC WITH DIFFERENTIAL/PLATELET
Basophils Absolute: 0.1 10*3/uL (ref 0.0–0.1)
Basophils Relative: 0.5 % (ref 0.0–3.0)
Eosinophils Absolute: 0.1 10*3/uL (ref 0.0–0.7)
Eosinophils Relative: 0.9 % (ref 0.0–5.0)
HCT: 46.5 % (ref 39.0–52.0)
Hemoglobin: 15.8 g/dL (ref 13.0–17.0)
Lymphocytes Relative: 18 % (ref 12.0–46.0)
Lymphs Abs: 2.3 10*3/uL (ref 0.7–4.0)
MCHC: 34 g/dL (ref 30.0–36.0)
MCV: 92.2 fl (ref 78.0–100.0)
Monocytes Absolute: 1.4 10*3/uL — ABNORMAL HIGH (ref 0.1–1.0)
Monocytes Relative: 10.9 % (ref 3.0–12.0)
Neutro Abs: 9 10*3/uL — ABNORMAL HIGH (ref 1.4–7.7)
Neutrophils Relative %: 69.7 % (ref 43.0–77.0)
Platelets: 239 10*3/uL (ref 150.0–400.0)
RBC: 5.04 Mil/uL (ref 4.22–5.81)
RDW: 13.3 % (ref 11.5–15.5)
WBC: 13 10*3/uL — ABNORMAL HIGH (ref 4.0–10.5)

## 2024-08-23 NOTE — Progress Notes (Signed)
 Agree with assessment and plan as outlined.

## 2024-08-24 ENCOUNTER — Ambulatory Visit

## 2024-08-24 VITALS — BP 143/82 | HR 81 | Resp 18 | Ht 70.0 in | Wt 175.0 lb

## 2024-08-24 DIAGNOSIS — R911 Solitary pulmonary nodule: Secondary | ICD-10-CM

## 2024-08-24 DIAGNOSIS — I7121 Aneurysm of the ascending aorta, without rupture: Secondary | ICD-10-CM

## 2024-08-24 LAB — PROTIME-INR
INR: 1
Prothrombin Time: 11 s (ref 9.0–11.5)

## 2024-08-24 NOTE — Progress Notes (Signed)
 Reminder entered in epic for repeat MRI in march 2026.

## 2024-08-24 NOTE — Progress Notes (Signed)
 George Watson                                          MRN: [id number]   08/24/2024   The [facility] Quality Team Specialist reviewed this patient medical record for the purposes of chart review for care gap closure. The following were reviewed: chart review for care gap closure-kidney health evaluation for diabetes:eGFR  and uACR.    [facility] Quality Team

## 2024-08-24 NOTE — Progress Notes (Unsigned)
 "      [address] [place] [address]             [phone]            George Watson [id number] 1945-07-25   History of Present Illness:  Has been taking predenison for  Ride bike stretching     Medications Ordered Prior to Encounter[1]   ROS:   BP (!) 143/82   Pulse 81   Resp 18   Ht 5' 10 (1.778 m)   Wt 175 lb (79.4 kg)   SpO2 97%   BMI 25.11 kg/m     Imaging: CLINICAL DATA:  Aneurysm of ascending aorta without rupture.   EXAM: CT ANGIOGRAPHY CHEST WITH CONTRAST   TECHNIQUE: Multidetector CT imaging of the chest was performed using ECG gated protocol during bolus administration of intravenous contrast. Multiplanar CT image reconstructions and MIPs were obtained to evaluate the vascular anatomy.   RADIATION DOSE REDUCTION: This exam was performed according to the departmental dose-optimization program which includes automated exposure control, adjustment of the mA and/or kV according to patient size and/or use of iterative reconstruction technique.   CONTRAST:  75mL OMNIPAQUE  IOHEXOL  350 MG/ML SOLN   COMPARISON:  Chest CTA 08/12/2021 and coronary calcium  score 09/17/2023   FINDINGS: Cardiovascular: Aortic root measures 4.0 cm. Maximum dimension of the ascending thoracic aorta is 3.9 cm. No evidence for aortic dissection. Conventional 3 vessel arch anatomy. Atherosclerotic calcifications involving the thoracic aorta and great vessels. Arch great vessels are patent. Proximal descending thoracic aorta measures 3.4 cm. Main pulmonary arteries are patent. Coronary artery calcifications. Heart size is normal. No significant pericardial effusion.   Mediastinum/Nodes: Thyroid  tissue is unremarkable. No mediastinal, hilar or axillary lymph node enlargement. Esophagus is unremarkable.   Lungs/Pleura: Trachea and mainstem bronchi are patent. Mild centrilobular and paraseptal emphysematous changes. Chronic scarring or  pleural thickening along the lateral right upper lobe. No airspace disease or lung consolidation. 4 mm nodular density in left lower lobe on image 82/302 may be new since 2023. Slightly elongated density in the left lower lobe on image 83/3 2 may also be new. These could be postinflammatory but etiology is unknown. Linear scarring or atelectasis in the left lower lobe. No pleural effusions.   Upper Abdomen: Images of the upper abdomen are unremarkable.   Musculoskeletal: Postsurgical changes in the right shoulder. Surgical plate in the lower cervical spine. Small sclerotic focus near the superior endplate of T12 is best seen on image 100, sequence 602. This small sclerotic focus is new since 08/12/2021 and enlarged since 09/17/2022. This sclerotic focus is small measuring approximately 5 mm. There are least 2 additional small sclerotic foci in the thoracic spine but they have not significantly changed.   Review of the MIP images confirms the above findings.   IMPRESSION: 1. Ascending thoracic aorta measures up to 3.9 cm. Aortic root measures 4.0 cm. 2. **An incidental finding of potential clinical significance has been found. Slowly enlarging sclerotic focus near the superior endplate of T12. Findings are nonspecific but recommend correlation with PSA level.** 3. Aortic Atherosclerosis (ICD10-I70.0) and Emphysema (ICD10-J43.9).     Electronically Signed   By: Juliene Balder M.D.   On: 08/05/2024 09:05     A/P:      Risk Modification:  Statin:  ***  Smoking cessation instruction/counseling given:  {CHL AMB [facility] SMOKING CESSATION COUNSELING:20758}  Patient was counseled on importance of Blood Pressure Control  They are instructed to contact their Primary Care Physician if they start to have blood pressure readings over 130s/90s. Do not ever stop blood pressure medications on your own, unless instructed by healthcare professional.  Please avoid use of Fluoroquinolones as this  can potentially increase your risk of Aortic Rupture and/or Dissection  Patient educated on signs and symptoms of Aortic Dissection, handout also provided in AVS  Manuelita CHRISTELLA Rough, PA-C 08/24/24     [1]  Current Outpatient Medications on File Prior to Visit  Medication Sig Dispense Refill   albuterol  (VENTOLIN  HFA) 108 (90 Base) MCG/ACT inhaler Inhale 2 puffs into the lungs every 6 (six) hours as needed for wheezing or shortness of breath (AS NEEDED FOR SHORTNESS OF BREATH). 18 each 5   aspirin  EC 81 MG EC tablet Take 1 tablet (81 mg total) by mouth daily.     clobetasol cream (TEMOVATE) 0.05 % 1 APPLICATION EXTERNALLY TWICE A DAY 30 DAYS     co-enzyme Q-10 30 MG capsule Take 30 mg by mouth daily at 8 pm.     diazepam  (VALIUM ) 5 MG tablet Take 1 tablet (5 mg total) by mouth daily as needed. 30 tablet 0   diphenoxylate -atropine  (LOMOTIL ) 2.5-0.025 MG tablet Take 1 tablet by mouth 2 (two) times daily as needed for diarrhea or loose stools. 60 tablet 1   doxycycline  (PERIOSTAT ) 20 MG tablet Take 20 mg by mouth 2 (two) times daily.     dutasteride  (AVODART ) 0.5 MG capsule Take 0.5 mg by mouth daily.     fluorouracil (EFUDEX) 5 % cream Apply 1 Application topically 2 (two) times daily.     fluticasone  (FLONASE ) 50 MCG/ACT nasal spray Place 2 sprays into both nostrils daily. 16 g 11   Fluticasone -Umeclidin-Vilant (TRELEGY ELLIPTA ) 100-62.5-25 MCG/ACT AEPB INHALE 1 PUFF BY MOUTH EVERY DAY 60 each 5   glucosamine-chondroitin 500-400 MG tablet Take 3 tablets by mouth daily.     losartan -hydrochlorothiazide  (HYZAAR) 100-25 MG tablet Take 1 tablet by mouth daily. 90 tablet 3   meloxicam  (MOBIC ) 15 MG tablet TAKE 1 TABLET (15 MG TOTAL) BY MOUTH DAILY. 30 tablet 0   metFORMIN  (GLUCOPHAGE ) 500 MG tablet Take 1 tablet (500 mg total) by mouth daily with breakfast. 90 tablet 0   methocarbamol  (ROBAXIN ) 500 MG tablet Take 1 tablet (500 mg total) by mouth every 8 (eight) hours as needed for muscle spasms. 30  tablet 5   Omega-3 Fatty Acids (FISH OIL) 300 MG CAPS Take by mouth daily.     predniSONE  (DELTASONE ) 1 MG tablet Take 3 mg by mouth daily.     predniSONE  (DELTASONE ) 5 MG tablet Take 5 mg by mouth daily.     rosuvastatin  (CRESTOR ) 5 MG tablet TAKE 1 TABLET (5 MG TOTAL) BY MOUTH DAILY. 90 tablet 1   tamsulosin  (FLOMAX ) 0.4 MG CAPS capsule Take 0.4 mg by mouth daily.     Turmeric (QC TUMERIC COMPLEX PO) Take by mouth daily at 2 PM.     Vitamin D -Vitamin K (VITAMIN K2 -VITAMIN D3) 90-125 MCG CAPS Take 90-125 mcg by mouth daily.     No current facility-administered medications on file prior to visit.   "

## 2024-08-25 ENCOUNTER — Ambulatory Visit: Payer: Self-pay | Admitting: Physician Assistant

## 2024-08-25 NOTE — Patient Instructions (Signed)

## 2024-08-26 ENCOUNTER — Other Ambulatory Visit: Payer: Self-pay | Admitting: Family Medicine

## 2024-08-30 ENCOUNTER — Ambulatory Visit: Admitting: Sports Medicine

## 2024-09-08 ENCOUNTER — Ambulatory Visit: Admitting: Family Medicine
